# Patient Record
Sex: Female | Born: 1950 | Race: Black or African American | Hispanic: No | Smoking: Former smoker
Health system: Southern US, Community
[De-identification: ages and names within clinical notes are randomized; demographics above are authoritative.]

## PROBLEM LIST (undated history)

## (undated) DIAGNOSIS — I442 Atrioventricular block, complete: Secondary | ICD-10-CM

## (undated) DIAGNOSIS — I4819 Other persistent atrial fibrillation: Secondary | ICD-10-CM

## (undated) DIAGNOSIS — E039 Hypothyroidism, unspecified: Secondary | ICD-10-CM

## (undated) DIAGNOSIS — I272 Pulmonary hypertension, unspecified: Secondary | ICD-10-CM

## (undated) DIAGNOSIS — G459 Transient cerebral ischemic attack, unspecified: Secondary | ICD-10-CM

## (undated) DIAGNOSIS — N183 Chronic kidney disease, stage 3 (moderate): Secondary | ICD-10-CM

## (undated) DIAGNOSIS — E119 Type 2 diabetes mellitus without complications: Secondary | ICD-10-CM

## (undated) DIAGNOSIS — I10 Essential (primary) hypertension: Secondary | ICD-10-CM

## (undated) DIAGNOSIS — Z950 Presence of cardiac pacemaker: Secondary | ICD-10-CM

## (undated) DIAGNOSIS — K219 Gastro-esophageal reflux disease without esophagitis: Secondary | ICD-10-CM

## (undated) HISTORY — DX: Atrioventricular block, complete: I44.2

## (undated) HISTORY — DX: Other persistent atrial fibrillation: I48.19

## (undated) HISTORY — DX: Transient cerebral ischemic attack, unspecified: G45.9

## (undated) HISTORY — PX: TUBAL LIGATION: SHX77

## (undated) HISTORY — DX: Type 2 diabetes mellitus without complications: E11.9

## (undated) HISTORY — DX: Hypothyroidism, unspecified: E03.9

## (undated) HISTORY — PX: ABDOMINAL HYSTERECTOMY: SHX81

## (undated) HISTORY — DX: Gastro-esophageal reflux disease without esophagitis: K21.9

## (undated) HISTORY — DX: Pulmonary hypertension, unspecified: I27.20

## (undated) HISTORY — PX: CHOLECYSTECTOMY: SHX55

## (undated) HISTORY — DX: Essential (primary) hypertension: I10

## (undated) HISTORY — PX: TONSILLECTOMY: SUR1361

## (undated) HISTORY — DX: Presence of cardiac pacemaker: Z95.0

## (undated) HISTORY — DX: Chronic kidney disease, stage 3 (moderate): N18.3

---

## 2005-04-29 ENCOUNTER — Ambulatory Visit: Payer: Self-pay | Admitting: Cardiovascular Disease

## 2005-09-02 ENCOUNTER — Ambulatory Visit: Payer: Self-pay | Admitting: Internal Medicine

## 2007-04-17 ENCOUNTER — Ambulatory Visit: Payer: Self-pay | Admitting: Internal Medicine

## 2007-10-30 ENCOUNTER — Ambulatory Visit: Payer: Self-pay | Admitting: Internal Medicine

## 2008-03-11 ENCOUNTER — Ambulatory Visit: Payer: Self-pay | Admitting: Internal Medicine

## 2008-03-17 ENCOUNTER — Ambulatory Visit: Payer: Self-pay | Admitting: Gastroenterology

## 2008-09-22 ENCOUNTER — Ambulatory Visit: Payer: Self-pay | Admitting: Internal Medicine

## 2008-12-27 ENCOUNTER — Ambulatory Visit: Payer: Self-pay | Admitting: Internal Medicine

## 2009-10-26 ENCOUNTER — Other Ambulatory Visit: Payer: Self-pay | Admitting: Internal Medicine

## 2009-12-21 ENCOUNTER — Other Ambulatory Visit: Payer: Self-pay | Admitting: Internal Medicine

## 2010-04-03 ENCOUNTER — Ambulatory Visit: Payer: Self-pay | Admitting: Internal Medicine

## 2010-06-21 ENCOUNTER — Other Ambulatory Visit: Payer: Self-pay | Admitting: Internal Medicine

## 2012-02-14 ENCOUNTER — Ambulatory Visit: Payer: Self-pay | Admitting: Internal Medicine

## 2012-07-24 ENCOUNTER — Ambulatory Visit: Payer: Self-pay | Admitting: Internal Medicine

## 2012-08-05 ENCOUNTER — Ambulatory Visit: Payer: Self-pay | Admitting: Internal Medicine

## 2013-05-25 ENCOUNTER — Ambulatory Visit (INDEPENDENT_AMBULATORY_CARE_PROVIDER_SITE_OTHER): Payer: PRIVATE HEALTH INSURANCE | Admitting: Podiatry

## 2013-05-25 ENCOUNTER — Encounter: Payer: Self-pay | Admitting: Podiatry

## 2013-05-25 VITALS — BP 148/86 | HR 64 | Resp 16 | Ht 60.0 in | Wt 174.0 lb

## 2013-05-25 DIAGNOSIS — L600 Ingrowing nail: Secondary | ICD-10-CM

## 2013-05-25 NOTE — Progress Notes (Signed)
Subjective:     Patient ID: Renee Boyd, female   DOB: 12-12-1950, 62 y.o.   MRN: [id number]  HPI patient presents concerned that her toenails have not healed secondary to ingrown toenail surgery 1 month ago. Has not noted drainage   Review of Systems  All other systems reviewed and are negative.       Objective:   Physical Exam  Nursing note and vitals reviewed. Cardiovascular: Intact distal pulses.   Musculoskeletal: Normal range of motion.  Neurological: She is alert.  Skin: Skin is warm.   there is crusted tissue formation on the medial border hallux bilateral with no erythema edema or drainage noted    Assessment:     Healing ingrown toenails slowly do to long-term diabetes    Plan:     Explained I do not see signs of infection and I do believe these will heal uneventfully. Given instructions on air drying and thick bandages when wearing shoes

## 2013-07-13 ENCOUNTER — Ambulatory Visit: Payer: Self-pay | Admitting: Internal Medicine

## 2013-07-13 LAB — CBC WITH DIFFERENTIAL/PLATELET
Basophil #: 0 10*3/uL (ref 0.0–0.1)
Basophil %: 0.5 %
Eosinophil #: 0.1 10*3/uL (ref 0.0–0.7)
Eosinophil %: 1.2 %
HCT: 34.4 % — ABNORMAL LOW (ref 35.0–47.0)
HGB: 11 g/dL — ABNORMAL LOW (ref 12.0–16.0)
Lymphocyte #: 1.7 10*3/uL (ref 1.0–3.6)
Lymphocyte %: 34.5 %
MCH: 28.3 pg (ref 26.0–34.0)
MCHC: 31.9 g/dL — ABNORMAL LOW (ref 32.0–36.0)
MCV: 89 fL (ref 80–100)
Monocyte #: 0.4 x10 3/mm (ref 0.2–0.9)
Monocyte %: 8.6 %
Neutrophil #: 2.7 10*3/uL (ref 1.4–6.5)
Neutrophil %: 55.2 %
Platelet: 225 10*3/uL (ref 150–440)
RBC: 3.87 10*6/uL (ref 3.80–5.20)
RDW: 14.6 % — ABNORMAL HIGH (ref 11.5–14.5)
WBC: 4.9 10*3/uL (ref 3.6–11.0)

## 2013-07-13 LAB — BASIC METABOLIC PANEL
Anion Gap: 7 (ref 7–16)
BUN: 26 mg/dL — ABNORMAL HIGH (ref 7–18)
Calcium, Total: 9.8 mg/dL (ref 8.5–10.1)
Chloride: 104 mmol/L (ref 98–107)
Co2: 29 mmol/L (ref 21–32)
Creatinine: 1.1 mg/dL (ref 0.60–1.30)
EGFR (African American): >60
EGFR (Non-African Amer.): 53 — ABNORMAL LOW
Glucose: 91 mg/dL (ref 65–99)
Osmolality: 284 (ref 275–301)
Potassium: 4.1 mmol/L (ref 3.5–5.1)
Sodium: 140 mmol/L (ref 136–145)

## 2013-07-21 ENCOUNTER — Ambulatory Visit: Payer: Self-pay | Admitting: Internal Medicine

## 2014-04-29 DIAGNOSIS — R109 Unspecified abdominal pain: Secondary | ICD-10-CM | POA: Diagnosis not present

## 2014-04-29 DIAGNOSIS — IMO0001 Reserved for inherently not codable concepts without codable children: Secondary | ICD-10-CM | POA: Diagnosis not present

## 2014-04-29 DIAGNOSIS — K859 Acute pancreatitis without necrosis or infection, unspecified: Secondary | ICD-10-CM | POA: Diagnosis not present

## 2014-04-29 DIAGNOSIS — K8020 Calculus of gallbladder without cholecystitis without obstruction: Secondary | ICD-10-CM | POA: Diagnosis not present

## 2014-04-29 DIAGNOSIS — K909 Intestinal malabsorption, unspecified: Secondary | ICD-10-CM | POA: Diagnosis not present

## 2014-05-04 ENCOUNTER — Ambulatory Visit: Payer: Self-pay | Admitting: Internal Medicine

## 2014-05-04 DIAGNOSIS — I77819 Aortic ectasia, unspecified site: Secondary | ICD-10-CM | POA: Diagnosis not present

## 2014-05-04 DIAGNOSIS — K7689 Other specified diseases of liver: Secondary | ICD-10-CM | POA: Diagnosis not present

## 2014-05-04 DIAGNOSIS — R1011 Right upper quadrant pain: Secondary | ICD-10-CM | POA: Diagnosis not present

## 2014-05-11 DIAGNOSIS — K529 Noninfective gastroenteritis and colitis, unspecified: Secondary | ICD-10-CM | POA: Diagnosis not present

## 2014-05-11 DIAGNOSIS — I10 Essential (primary) hypertension: Secondary | ICD-10-CM | POA: Diagnosis not present

## 2014-05-11 DIAGNOSIS — N182 Chronic kidney disease, stage 2 (mild): Secondary | ICD-10-CM | POA: Diagnosis not present

## 2014-05-11 DIAGNOSIS — Z950 Presence of cardiac pacemaker: Secondary | ICD-10-CM | POA: Diagnosis not present

## 2014-05-18 DIAGNOSIS — K589 Irritable bowel syndrome without diarrhea: Secondary | ICD-10-CM | POA: Diagnosis not present

## 2014-05-18 DIAGNOSIS — K760 Fatty (change of) liver, not elsewhere classified: Secondary | ICD-10-CM | POA: Diagnosis not present

## 2014-05-18 DIAGNOSIS — R112 Nausea with vomiting, unspecified: Secondary | ICD-10-CM | POA: Diagnosis not present

## 2014-05-18 DIAGNOSIS — D126 Benign neoplasm of colon, unspecified: Secondary | ICD-10-CM | POA: Diagnosis not present

## 2014-06-09 DIAGNOSIS — Z45010 Encounter for checking and testing of cardiac pacemaker pulse generator [battery]: Secondary | ICD-10-CM | POA: Diagnosis not present

## 2014-06-09 DIAGNOSIS — Z950 Presence of cardiac pacemaker: Secondary | ICD-10-CM | POA: Diagnosis not present

## 2014-06-09 DIAGNOSIS — N182 Chronic kidney disease, stage 2 (mild): Secondary | ICD-10-CM | POA: Diagnosis not present

## 2014-06-09 DIAGNOSIS — I2510 Atherosclerotic heart disease of native coronary artery without angina pectoris: Secondary | ICD-10-CM | POA: Diagnosis not present

## 2014-07-05 ENCOUNTER — Ambulatory Visit: Payer: Self-pay | Admitting: Gastroenterology

## 2014-07-05 DIAGNOSIS — E039 Hypothyroidism, unspecified: Secondary | ICD-10-CM | POA: Diagnosis not present

## 2014-07-05 DIAGNOSIS — D130 Benign neoplasm of esophagus: Secondary | ICD-10-CM | POA: Diagnosis not present

## 2014-07-05 DIAGNOSIS — Z86010 Personal history of colonic polyps: Secondary | ICD-10-CM | POA: Diagnosis not present

## 2014-07-05 DIAGNOSIS — R112 Nausea with vomiting, unspecified: Secondary | ICD-10-CM | POA: Diagnosis not present

## 2014-07-05 DIAGNOSIS — I2510 Atherosclerotic heart disease of native coronary artery without angina pectoris: Secondary | ICD-10-CM | POA: Diagnosis not present

## 2014-07-05 DIAGNOSIS — Z1211 Encounter for screening for malignant neoplasm of colon: Secondary | ICD-10-CM | POA: Diagnosis not present

## 2014-07-05 DIAGNOSIS — Z87891 Personal history of nicotine dependence: Secondary | ICD-10-CM | POA: Diagnosis not present

## 2014-07-05 DIAGNOSIS — E119 Type 2 diabetes mellitus without complications: Secondary | ICD-10-CM | POA: Diagnosis not present

## 2014-07-05 DIAGNOSIS — K449 Diaphragmatic hernia without obstruction or gangrene: Secondary | ICD-10-CM | POA: Diagnosis not present

## 2014-07-05 DIAGNOSIS — Z79899 Other long term (current) drug therapy: Secondary | ICD-10-CM | POA: Diagnosis not present

## 2014-07-05 DIAGNOSIS — I10 Essential (primary) hypertension: Secondary | ICD-10-CM | POA: Diagnosis not present

## 2014-07-05 DIAGNOSIS — K317 Polyp of stomach and duodenum: Secondary | ICD-10-CM | POA: Diagnosis not present

## 2014-07-05 DIAGNOSIS — Z9889 Other specified postprocedural states: Secondary | ICD-10-CM | POA: Diagnosis not present

## 2014-07-05 DIAGNOSIS — D124 Benign neoplasm of descending colon: Secondary | ICD-10-CM | POA: Diagnosis not present

## 2014-07-05 DIAGNOSIS — D125 Benign neoplasm of sigmoid colon: Secondary | ICD-10-CM | POA: Diagnosis not present

## 2014-08-16 DIAGNOSIS — E119 Type 2 diabetes mellitus without complications: Secondary | ICD-10-CM | POA: Diagnosis not present

## 2014-08-16 DIAGNOSIS — H40053 Ocular hypertension, bilateral: Secondary | ICD-10-CM | POA: Diagnosis not present

## 2014-09-09 DIAGNOSIS — Z950 Presence of cardiac pacemaker: Secondary | ICD-10-CM | POA: Diagnosis not present

## 2014-09-09 DIAGNOSIS — I10 Essential (primary) hypertension: Secondary | ICD-10-CM | POA: Diagnosis not present

## 2014-09-20 DIAGNOSIS — E8881 Metabolic syndrome: Secondary | ICD-10-CM | POA: Diagnosis not present

## 2014-09-20 DIAGNOSIS — N182 Chronic kidney disease, stage 2 (mild): Secondary | ICD-10-CM | POA: Diagnosis not present

## 2014-09-20 DIAGNOSIS — I2510 Atherosclerotic heart disease of native coronary artery without angina pectoris: Secondary | ICD-10-CM | POA: Diagnosis not present

## 2014-09-20 DIAGNOSIS — E1165 Type 2 diabetes mellitus with hyperglycemia: Secondary | ICD-10-CM | POA: Diagnosis not present

## 2014-10-07 DIAGNOSIS — M7072 Other bursitis of hip, left hip: Secondary | ICD-10-CM | POA: Diagnosis not present

## 2014-10-12 DIAGNOSIS — H40003 Preglaucoma, unspecified, bilateral: Secondary | ICD-10-CM | POA: Diagnosis not present

## 2014-11-25 NOTE — Op Note (Signed)
PATIENT NAME:  Renee Boyd, Renee Boyd MR#:  [id number] DATE OF BIRTH:  1951/08/03  DATE OF PROCEDURE:  07/21/2013  PROCEDURE: Replacement of permanent pacemaker (battery change).   PREOPERATIVE DIAGNOSES:  Syncope, severe junctional bradycardia, atrial fibrillation.   POSTOPERATIVE DIAGNOSES:  Syncope, severe junctional bradycardia, atrial fibrillation.   ATTENDING PHYSICIAN:  Cletis Athens, MD  IMPLANTING PHYSICIAN: Cletis Athens, MD  PACER DATA: Device name Advisa, serial number [id number].  This is MRI compatible pacemaker, MRI compatibility [id number].  Leads are also MRI compatible. Mode DDDR, low rate 60, upper tracking rate 130, operative sensor 130. Paced AV 180 ms, threshold measured at the time of interrogation of the lead after explantation of the pacemaker on the right ventricular lead resistance was 647, threshold 0.4, current 0.6, atrial, lead, resistance 458.   PROCEDURE NOTE: The patient was taken to the Operating Room and the left upper chest was prepared with Hibiclens and it was draped in a sterile mild fashion. Next, 1% Xylocaine was infiltrated under the skin overlying the previous pacemaker site and subcutaneous tissue were infiltrated with lidocaine 1% and then an incision was made on the skin overlying the deltopectoral groove. After making an incision, subcutaneous tissues were dissected out. Pacemaker pocket was found and opened, and it was washed with gentamicin solution. This pacemaker was taken out and both leads were disconnected and they were checked and they were found to be satisfactory. Appearance of the lead also looks good without any corrosion. Next, after checking both leads the ventricular lead was put in the ventricular side of the pacemaker and the atrial lead to the atrial channel of the pacemaker. All connections were tightened securely according to company specification. Pacemaker was put in the pocket. Subcutaneous tissue and skin was closed separately, and the patient  was returned to the recovery room in a stable condition.    ____________________________ Cletis Athens, MD jm:cs D: 07/21/2013 14:09:33 ET T: 07/21/2013 20:11:17 ET JOB#: [id number]  cc: Cletis Athens, MD, <Dictator> Cletis Athens MD ELECTRONICALLY SIGNED 08/04/2013 13:39

## 2014-11-28 LAB — SURGICAL PATHOLOGY

## 2014-12-08 DIAGNOSIS — I2510 Atherosclerotic heart disease of native coronary artery without angina pectoris: Secondary | ICD-10-CM | POA: Diagnosis not present

## 2014-12-08 DIAGNOSIS — Z950 Presence of cardiac pacemaker: Secondary | ICD-10-CM | POA: Diagnosis not present

## 2014-12-08 DIAGNOSIS — K2960 Other gastritis without bleeding: Secondary | ICD-10-CM | POA: Diagnosis not present

## 2014-12-19 DIAGNOSIS — I209 Angina pectoris, unspecified: Secondary | ICD-10-CM | POA: Diagnosis not present

## 2014-12-19 DIAGNOSIS — I2510 Atherosclerotic heart disease of native coronary artery without angina pectoris: Secondary | ICD-10-CM | POA: Diagnosis not present

## 2014-12-19 DIAGNOSIS — K2960 Other gastritis without bleeding: Secondary | ICD-10-CM | POA: Diagnosis not present

## 2014-12-19 DIAGNOSIS — E1165 Type 2 diabetes mellitus with hyperglycemia: Secondary | ICD-10-CM | POA: Diagnosis not present

## 2014-12-21 DIAGNOSIS — E119 Type 2 diabetes mellitus without complications: Secondary | ICD-10-CM | POA: Diagnosis not present

## 2015-01-03 DIAGNOSIS — I2510 Atherosclerotic heart disease of native coronary artery without angina pectoris: Secondary | ICD-10-CM | POA: Diagnosis not present

## 2015-01-03 DIAGNOSIS — R609 Edema, unspecified: Secondary | ICD-10-CM | POA: Diagnosis not present

## 2015-01-03 DIAGNOSIS — I209 Angina pectoris, unspecified: Secondary | ICD-10-CM | POA: Diagnosis not present

## 2015-01-03 DIAGNOSIS — E1165 Type 2 diabetes mellitus with hyperglycemia: Secondary | ICD-10-CM | POA: Diagnosis not present

## 2015-02-02 DIAGNOSIS — E669 Obesity, unspecified: Secondary | ICD-10-CM | POA: Diagnosis not present

## 2015-02-02 DIAGNOSIS — I209 Angina pectoris, unspecified: Secondary | ICD-10-CM | POA: Diagnosis not present

## 2015-02-02 DIAGNOSIS — E1165 Type 2 diabetes mellitus with hyperglycemia: Secondary | ICD-10-CM | POA: Diagnosis not present

## 2015-02-02 DIAGNOSIS — I2510 Atherosclerotic heart disease of native coronary artery without angina pectoris: Secondary | ICD-10-CM | POA: Diagnosis not present

## 2015-03-09 DIAGNOSIS — I209 Angina pectoris, unspecified: Secondary | ICD-10-CM | POA: Diagnosis not present

## 2015-03-09 DIAGNOSIS — I2510 Atherosclerotic heart disease of native coronary artery without angina pectoris: Secondary | ICD-10-CM | POA: Diagnosis not present

## 2015-03-09 DIAGNOSIS — Z950 Presence of cardiac pacemaker: Secondary | ICD-10-CM | POA: Diagnosis not present

## 2015-03-22 DIAGNOSIS — I2510 Atherosclerotic heart disease of native coronary artery without angina pectoris: Secondary | ICD-10-CM | POA: Diagnosis not present

## 2015-03-22 DIAGNOSIS — R609 Edema, unspecified: Secondary | ICD-10-CM | POA: Diagnosis not present

## 2015-03-22 DIAGNOSIS — I209 Angina pectoris, unspecified: Secondary | ICD-10-CM | POA: Diagnosis not present

## 2015-03-22 DIAGNOSIS — Z881 Allergy status to other antibiotic agents status: Secondary | ICD-10-CM | POA: Diagnosis not present

## 2015-03-29 DIAGNOSIS — E1165 Type 2 diabetes mellitus with hyperglycemia: Secondary | ICD-10-CM | POA: Diagnosis not present

## 2015-03-29 DIAGNOSIS — Z950 Presence of cardiac pacemaker: Secondary | ICD-10-CM | POA: Diagnosis not present

## 2015-03-29 DIAGNOSIS — K2960 Other gastritis without bleeding: Secondary | ICD-10-CM | POA: Diagnosis not present

## 2015-03-29 DIAGNOSIS — I2510 Atherosclerotic heart disease of native coronary artery without angina pectoris: Secondary | ICD-10-CM | POA: Diagnosis not present

## 2015-05-05 DIAGNOSIS — T63301A Toxic effect of unspecified spider venom, accidental (unintentional), initial encounter: Secondary | ICD-10-CM | POA: Diagnosis not present

## 2015-05-05 DIAGNOSIS — E1165 Type 2 diabetes mellitus with hyperglycemia: Secondary | ICD-10-CM | POA: Diagnosis not present

## 2015-05-09 DIAGNOSIS — E1165 Type 2 diabetes mellitus with hyperglycemia: Secondary | ICD-10-CM | POA: Diagnosis not present

## 2015-05-09 DIAGNOSIS — Z23 Encounter for immunization: Secondary | ICD-10-CM | POA: Diagnosis not present

## 2015-05-09 DIAGNOSIS — I2510 Atherosclerotic heart disease of native coronary artery without angina pectoris: Secondary | ICD-10-CM | POA: Diagnosis not present

## 2015-05-09 DIAGNOSIS — T63301A Toxic effect of unspecified spider venom, accidental (unintentional), initial encounter: Secondary | ICD-10-CM | POA: Diagnosis not present

## 2015-06-08 DIAGNOSIS — E1165 Type 2 diabetes mellitus with hyperglycemia: Secondary | ICD-10-CM | POA: Diagnosis not present

## 2015-06-08 DIAGNOSIS — Z881 Allergy status to other antibiotic agents status: Secondary | ICD-10-CM | POA: Diagnosis not present

## 2015-06-08 DIAGNOSIS — Z950 Presence of cardiac pacemaker: Secondary | ICD-10-CM | POA: Diagnosis not present

## 2015-06-08 DIAGNOSIS — N182 Chronic kidney disease, stage 2 (mild): Secondary | ICD-10-CM | POA: Diagnosis not present

## 2015-06-08 DIAGNOSIS — I209 Angina pectoris, unspecified: Secondary | ICD-10-CM | POA: Diagnosis not present

## 2015-06-16 DIAGNOSIS — I10 Essential (primary) hypertension: Secondary | ICD-10-CM | POA: Diagnosis not present

## 2015-06-16 DIAGNOSIS — R5381 Other malaise: Secondary | ICD-10-CM | POA: Diagnosis not present

## 2015-06-16 DIAGNOSIS — E784 Other hyperlipidemia: Secondary | ICD-10-CM | POA: Diagnosis not present

## 2015-06-16 DIAGNOSIS — E119 Type 2 diabetes mellitus without complications: Secondary | ICD-10-CM | POA: Diagnosis not present

## 2015-06-28 DIAGNOSIS — H40003 Preglaucoma, unspecified, bilateral: Secondary | ICD-10-CM | POA: Diagnosis not present

## 2015-06-28 DIAGNOSIS — H2513 Age-related nuclear cataract, bilateral: Secondary | ICD-10-CM | POA: Diagnosis not present

## 2015-06-28 LAB — HM DIABETES EYE EXAM

## 2015-08-10 DIAGNOSIS — M7062 Trochanteric bursitis, left hip: Secondary | ICD-10-CM | POA: Diagnosis not present

## 2015-08-10 DIAGNOSIS — M545 Low back pain: Secondary | ICD-10-CM | POA: Diagnosis not present

## 2015-09-04 DIAGNOSIS — I209 Angina pectoris, unspecified: Secondary | ICD-10-CM | POA: Diagnosis not present

## 2015-09-04 DIAGNOSIS — K2960 Other gastritis without bleeding: Secondary | ICD-10-CM | POA: Diagnosis not present

## 2015-09-04 DIAGNOSIS — N182 Chronic kidney disease, stage 2 (mild): Secondary | ICD-10-CM | POA: Diagnosis not present

## 2015-09-04 DIAGNOSIS — E1165 Type 2 diabetes mellitus with hyperglycemia: Secondary | ICD-10-CM | POA: Diagnosis not present

## 2015-09-08 DIAGNOSIS — N182 Chronic kidney disease, stage 2 (mild): Secondary | ICD-10-CM | POA: Diagnosis not present

## 2015-09-08 DIAGNOSIS — Z950 Presence of cardiac pacemaker: Secondary | ICD-10-CM | POA: Diagnosis not present

## 2015-09-08 DIAGNOSIS — E1165 Type 2 diabetes mellitus with hyperglycemia: Secondary | ICD-10-CM | POA: Diagnosis not present

## 2015-09-08 DIAGNOSIS — I209 Angina pectoris, unspecified: Secondary | ICD-10-CM | POA: Diagnosis not present

## 2015-11-01 DIAGNOSIS — E669 Obesity, unspecified: Secondary | ICD-10-CM | POA: Diagnosis not present

## 2015-11-01 DIAGNOSIS — N182 Chronic kidney disease, stage 2 (mild): Secondary | ICD-10-CM | POA: Diagnosis not present

## 2015-11-01 DIAGNOSIS — Z881 Allergy status to other antibiotic agents status: Secondary | ICD-10-CM | POA: Diagnosis not present

## 2015-11-01 DIAGNOSIS — E1165 Type 2 diabetes mellitus with hyperglycemia: Secondary | ICD-10-CM | POA: Diagnosis not present

## 2015-11-02 DIAGNOSIS — E784 Other hyperlipidemia: Secondary | ICD-10-CM | POA: Diagnosis not present

## 2015-11-02 DIAGNOSIS — I10 Essential (primary) hypertension: Secondary | ICD-10-CM | POA: Diagnosis not present

## 2015-11-02 DIAGNOSIS — E119 Type 2 diabetes mellitus without complications: Secondary | ICD-10-CM | POA: Diagnosis not present

## 2015-12-07 DIAGNOSIS — N182 Chronic kidney disease, stage 2 (mild): Secondary | ICD-10-CM | POA: Diagnosis not present

## 2015-12-07 DIAGNOSIS — I2510 Atherosclerotic heart disease of native coronary artery without angina pectoris: Secondary | ICD-10-CM | POA: Diagnosis not present

## 2015-12-07 DIAGNOSIS — E1165 Type 2 diabetes mellitus with hyperglycemia: Secondary | ICD-10-CM | POA: Diagnosis not present

## 2015-12-07 DIAGNOSIS — Z950 Presence of cardiac pacemaker: Secondary | ICD-10-CM | POA: Diagnosis not present

## 2015-12-07 DIAGNOSIS — I209 Angina pectoris, unspecified: Secondary | ICD-10-CM | POA: Diagnosis not present

## 2015-12-19 ENCOUNTER — Ambulatory Visit
Admission: RE | Admit: 2015-12-19 | Discharge: 2015-12-19 | Disposition: A | Discharge status: Home / Self Care | Payer: Medicare Other | Source: Ambulatory Visit | Attending: Internal Medicine | Admitting: Internal Medicine

## 2015-12-19 ENCOUNTER — Other Ambulatory Visit: Payer: Self-pay | Admitting: Internal Medicine

## 2015-12-19 DIAGNOSIS — R29898 Other symptoms and signs involving the musculoskeletal system: Secondary | ICD-10-CM | POA: Diagnosis not present

## 2015-12-19 DIAGNOSIS — N182 Chronic kidney disease, stage 2 (mild): Secondary | ICD-10-CM | POA: Diagnosis not present

## 2015-12-19 DIAGNOSIS — G319 Degenerative disease of nervous system, unspecified: Secondary | ICD-10-CM | POA: Diagnosis not present

## 2015-12-19 DIAGNOSIS — R531 Weakness: Secondary | ICD-10-CM | POA: Diagnosis not present

## 2015-12-19 DIAGNOSIS — G459 Transient cerebral ischemic attack, unspecified: Secondary | ICD-10-CM | POA: Diagnosis not present

## 2015-12-19 DIAGNOSIS — I2510 Atherosclerotic heart disease of native coronary artery without angina pectoris: Secondary | ICD-10-CM | POA: Diagnosis not present

## 2015-12-20 DIAGNOSIS — G459 Transient cerebral ischemic attack, unspecified: Secondary | ICD-10-CM | POA: Diagnosis not present

## 2015-12-25 DIAGNOSIS — I69392 Facial weakness following cerebral infarction: Secondary | ICD-10-CM | POA: Diagnosis not present

## 2015-12-25 DIAGNOSIS — I4892 Unspecified atrial flutter: Secondary | ICD-10-CM | POA: Diagnosis not present

## 2015-12-25 DIAGNOSIS — J399 Disease of upper respiratory tract, unspecified: Secondary | ICD-10-CM | POA: Diagnosis not present

## 2015-12-25 DIAGNOSIS — I4891 Unspecified atrial fibrillation: Secondary | ICD-10-CM | POA: Diagnosis not present

## 2016-01-02 DIAGNOSIS — H2513 Age-related nuclear cataract, bilateral: Secondary | ICD-10-CM | POA: Diagnosis not present

## 2016-01-09 DIAGNOSIS — I129 Hypertensive chronic kidney disease with stage 1 through stage 4 chronic kidney disease, or unspecified chronic kidney disease: Secondary | ICD-10-CM | POA: Diagnosis not present

## 2016-01-09 DIAGNOSIS — N184 Chronic kidney disease, stage 4 (severe): Secondary | ICD-10-CM | POA: Diagnosis not present

## 2016-01-09 DIAGNOSIS — E1122 Type 2 diabetes mellitus with diabetic chronic kidney disease: Secondary | ICD-10-CM | POA: Diagnosis not present

## 2016-01-09 DIAGNOSIS — E875 Hyperkalemia: Secondary | ICD-10-CM | POA: Diagnosis not present

## 2016-01-09 DIAGNOSIS — E785 Hyperlipidemia, unspecified: Secondary | ICD-10-CM | POA: Diagnosis not present

## 2016-01-11 DIAGNOSIS — I4891 Unspecified atrial fibrillation: Secondary | ICD-10-CM | POA: Diagnosis not present

## 2016-01-11 DIAGNOSIS — I4892 Unspecified atrial flutter: Secondary | ICD-10-CM | POA: Diagnosis not present

## 2016-01-16 DIAGNOSIS — N184 Chronic kidney disease, stage 4 (severe): Secondary | ICD-10-CM | POA: Diagnosis not present

## 2016-01-31 DIAGNOSIS — I4891 Unspecified atrial fibrillation: Secondary | ICD-10-CM | POA: Diagnosis not present

## 2016-01-31 DIAGNOSIS — I4892 Unspecified atrial flutter: Secondary | ICD-10-CM | POA: Diagnosis not present

## 2016-01-31 DIAGNOSIS — M7989 Other specified soft tissue disorders: Secondary | ICD-10-CM | POA: Diagnosis not present

## 2016-01-31 DIAGNOSIS — Z950 Presence of cardiac pacemaker: Secondary | ICD-10-CM | POA: Diagnosis not present

## 2016-02-07 ENCOUNTER — Emergency Department: Payer: Medicare Other

## 2016-02-07 ENCOUNTER — Encounter: Payer: Self-pay | Admitting: Emergency Medicine

## 2016-02-07 ENCOUNTER — Inpatient Hospital Stay
Admission: EM | Admit: 2016-02-07 | Discharge: 2016-02-11 | DRG: 813 | Disposition: A | Discharge status: Home / Self Care | Payer: Medicare Other | Attending: Internal Medicine | Admitting: Internal Medicine

## 2016-02-07 DIAGNOSIS — J811 Chronic pulmonary edema: Secondary | ICD-10-CM | POA: Diagnosis present

## 2016-02-07 DIAGNOSIS — D6832 Hemorrhagic disorder due to extrinsic circulating anticoagulants: Principal | ICD-10-CM | POA: Diagnosis present

## 2016-02-07 DIAGNOSIS — Z7901 Long term (current) use of anticoagulants: Secondary | ICD-10-CM

## 2016-02-07 DIAGNOSIS — R58 Hemorrhage, not elsewhere classified: Secondary | ICD-10-CM | POA: Diagnosis not present

## 2016-02-07 DIAGNOSIS — Z8673 Personal history of transient ischemic attack (TIA), and cerebral infarction without residual deficits: Secondary | ICD-10-CM

## 2016-02-07 DIAGNOSIS — I129 Hypertensive chronic kidney disease with stage 1 through stage 4 chronic kidney disease, or unspecified chronic kidney disease: Secondary | ICD-10-CM | POA: Diagnosis present

## 2016-02-07 DIAGNOSIS — Z8249 Family history of ischemic heart disease and other diseases of the circulatory system: Secondary | ICD-10-CM

## 2016-02-07 DIAGNOSIS — N179 Acute kidney failure, unspecified: Secondary | ICD-10-CM

## 2016-02-07 DIAGNOSIS — R791 Abnormal coagulation profile: Secondary | ICD-10-CM | POA: Diagnosis not present

## 2016-02-07 DIAGNOSIS — M7981 Nontraumatic hematoma of soft tissue: Secondary | ICD-10-CM | POA: Diagnosis present

## 2016-02-07 DIAGNOSIS — M79603 Pain in arm, unspecified: Secondary | ICD-10-CM | POA: Diagnosis not present

## 2016-02-07 DIAGNOSIS — E1122 Type 2 diabetes mellitus with diabetic chronic kidney disease: Secondary | ICD-10-CM | POA: Diagnosis present

## 2016-02-07 DIAGNOSIS — Z79899 Other long term (current) drug therapy: Secondary | ICD-10-CM

## 2016-02-07 DIAGNOSIS — I10 Essential (primary) hypertension: Secondary | ICD-10-CM | POA: Diagnosis not present

## 2016-02-07 DIAGNOSIS — M79605 Pain in left leg: Secondary | ICD-10-CM | POA: Diagnosis not present

## 2016-02-07 DIAGNOSIS — D649 Anemia, unspecified: Secondary | ICD-10-CM

## 2016-02-07 DIAGNOSIS — K219 Gastro-esophageal reflux disease without esophagitis: Secondary | ICD-10-CM | POA: Diagnosis present

## 2016-02-07 DIAGNOSIS — E039 Hypothyroidism, unspecified: Secondary | ICD-10-CM | POA: Diagnosis present

## 2016-02-07 DIAGNOSIS — Z950 Presence of cardiac pacemaker: Secondary | ICD-10-CM

## 2016-02-07 DIAGNOSIS — Z87891 Personal history of nicotine dependence: Secondary | ICD-10-CM

## 2016-02-07 DIAGNOSIS — R262 Difficulty in walking, not elsewhere classified: Secondary | ICD-10-CM | POA: Diagnosis not present

## 2016-02-07 DIAGNOSIS — Z90710 Acquired absence of both cervix and uterus: Secondary | ICD-10-CM

## 2016-02-07 DIAGNOSIS — J9601 Acute respiratory failure with hypoxia: Secondary | ICD-10-CM | POA: Diagnosis present

## 2016-02-07 DIAGNOSIS — E119 Type 2 diabetes mellitus without complications: Secondary | ICD-10-CM | POA: Diagnosis not present

## 2016-02-07 DIAGNOSIS — M79601 Pain in right arm: Secondary | ICD-10-CM | POA: Diagnosis not present

## 2016-02-07 DIAGNOSIS — Z885 Allergy status to narcotic agent status: Secondary | ICD-10-CM

## 2016-02-07 DIAGNOSIS — M79621 Pain in right upper arm: Secondary | ICD-10-CM | POA: Diagnosis not present

## 2016-02-07 DIAGNOSIS — T45515A Adverse effect of anticoagulants, initial encounter: Secondary | ICD-10-CM | POA: Diagnosis present

## 2016-02-07 DIAGNOSIS — M79604 Pain in right leg: Secondary | ICD-10-CM | POA: Diagnosis not present

## 2016-02-07 DIAGNOSIS — R531 Weakness: Secondary | ICD-10-CM | POA: Diagnosis not present

## 2016-02-07 DIAGNOSIS — R0902 Hypoxemia: Secondary | ICD-10-CM

## 2016-02-07 DIAGNOSIS — N183 Chronic kidney disease, stage 3 (moderate): Secondary | ICD-10-CM | POA: Diagnosis present

## 2016-02-07 LAB — RETICULOCYTES
RBC.: 2.54 MIL/uL — AB (ref 3.80–5.20)
RETIC CT PCT: 3 % (ref 0.4–3.1)
Retic Count, Absolute: 76.2 10*3/uL (ref 19.0–183.0)

## 2016-02-07 LAB — PHOSPHORUS: PHOSPHORUS: 4.5 mg/dL (ref 2.5–4.6)

## 2016-02-07 LAB — PREPARE RBC (CROSSMATCH)

## 2016-02-07 LAB — URINALYSIS COMPLETE WITH MICROSCOPIC (ARMC ONLY)
BILIRUBIN URINE: NEGATIVE
GLUCOSE, UA: NEGATIVE mg/dL
HGB URINE DIPSTICK: NEGATIVE
KETONES UR: NEGATIVE mg/dL
NITRITE: NEGATIVE
PH: 5 (ref 5.0–8.0)
Protein, ur: NEGATIVE mg/dL
Specific Gravity, Urine: 1.024 (ref 1.005–1.030)

## 2016-02-07 LAB — IRON AND TIBC
Iron: 41 ug/dL (ref 28–170)
SATURATION RATIOS: 13 % (ref 10.4–31.8)
TIBC: 323 ug/dL (ref 250–450)
UIBC: 282 ug/dL

## 2016-02-07 LAB — CBC WITH DIFFERENTIAL/PLATELET
BASOS PCT: 1 %
Basophils Absolute: 0.1 10*3/uL (ref 0–0.1)
EOS ABS: 0 10*3/uL (ref 0–0.7)
EOS PCT: 1 %
HCT: 20.5 % — ABNORMAL LOW (ref 35.0–47.0)
HEMOGLOBIN: 6.8 g/dL — AB (ref 12.0–16.0)
Lymphocytes Relative: 14 %
Lymphs Abs: 1.5 10*3/uL (ref 1.0–3.6)
MCH: 29 pg (ref 26.0–34.0)
MCHC: 33.2 g/dL (ref 32.0–36.0)
MCV: 87.2 fL (ref 80.0–100.0)
Monocytes Absolute: 0.7 10*3/uL (ref 0.2–0.9)
Monocytes Relative: 7 %
NEUTROS PCT: 77 %
Neutro Abs: 8.1 10*3/uL — ABNORMAL HIGH (ref 1.4–6.5)
PLATELETS: 439 10*3/uL (ref 150–440)
RBC: 2.36 MIL/uL — AB (ref 3.80–5.20)
RDW: 15.6 % — ABNORMAL HIGH (ref 11.5–14.5)
WBC: 10.4 10*3/uL (ref 3.6–11.0)

## 2016-02-07 LAB — GLUCOSE, CAPILLARY
Glucose-Capillary: 123 mg/dL — ABNORMAL HIGH (ref 65–99)
Glucose-Capillary: 133 mg/dL — ABNORMAL HIGH (ref 65–99)

## 2016-02-07 LAB — COMPREHENSIVE METABOLIC PANEL
ALK PHOS: 60 U/L (ref 38–126)
ALT: 10 U/L — AB (ref 14–54)
AST: 20 U/L (ref 15–41)
Albumin: 3.3 g/dL — ABNORMAL LOW (ref 3.5–5.0)
Anion gap: 11 (ref 5–15)
BILIRUBIN TOTAL: 0.7 mg/dL (ref 0.3–1.2)
BUN: 60 mg/dL — AB (ref 6–20)
CALCIUM: 9.2 mg/dL (ref 8.9–10.3)
CHLORIDE: 106 mmol/L (ref 101–111)
CO2: 19 mmol/L — ABNORMAL LOW (ref 22–32)
CREATININE: 2.63 mg/dL — AB (ref 0.44–1.00)
GFR, EST AFRICAN AMERICAN: 21 mL/min — AB (ref >60)
GFR, EST NON AFRICAN AMERICAN: 18 mL/min — AB (ref >60)
Glucose, Bld: 129 mg/dL — ABNORMAL HIGH (ref 65–99)
Potassium: 4.6 mmol/L (ref 3.5–5.1)
Sodium: 136 mmol/L (ref 135–145)
TOTAL PROTEIN: 7.1 g/dL (ref 6.5–8.1)

## 2016-02-07 LAB — VITAMIN B12: Vitamin B-12: 589 pg/mL (ref 180–914)

## 2016-02-07 LAB — PROTIME-INR: INR: >10

## 2016-02-07 LAB — ABO/RH: ABO/RH(D): A POS

## 2016-02-07 LAB — FERRITIN: FERRITIN: 191 ng/mL (ref 11–307)

## 2016-02-07 LAB — TSH: TSH: 0.793 u[IU]/mL (ref 0.350–4.500)

## 2016-02-07 LAB — MAGNESIUM: Magnesium: 1.8 mg/dL (ref 1.7–2.4)

## 2016-02-07 LAB — FOLATE: FOLATE: 10.5 ng/mL (ref >5.9)

## 2016-02-07 LAB — TROPONIN I

## 2016-02-07 MED ORDER — ONDANSETRON HCL 4 MG/2ML IJ SOLN: 4.0000 mg | Freq: Four times a day (QID) | INTRAMUSCULAR | Status: DC | PRN

## 2016-02-07 MED ORDER — METOPROLOL SUCCINATE ER 50 MG PO TB24
100.0000 mg | ORAL_TABLET | Freq: Every day | ORAL | Status: DC
  Administered 2016-02-07 – 2016-02-11 (×4): 100 mg via ORAL
  Filled 2016-02-07 (×5): qty 2

## 2016-02-07 MED ORDER — ACETAMINOPHEN 325 MG PO TABS
650.0000 mg | ORAL_TABLET | Freq: Four times a day (QID) | ORAL | Status: DC | PRN
  Administered 2016-02-07: 650 mg via ORAL
  Filled 2016-02-07: qty 2

## 2016-02-07 MED ORDER — CLONIDINE HCL 0.1 MG PO TABS
0.2000 mg | ORAL_TABLET | Freq: Every day | ORAL | Status: DC
  Administered 2016-02-08 – 2016-02-10 (×3): 0.2 mg via ORAL
  Filled 2016-02-07 (×3): qty 2

## 2016-02-07 MED ORDER — INSULIN ASPART 100 UNIT/ML ~~LOC~~ SOLN: 0.0000 [IU] | Freq: Every day | SUBCUTANEOUS | Status: DC

## 2016-02-07 MED ORDER — SUCRALFATE 1 G PO TABS
1.0000 g | ORAL_TABLET | Freq: Two times a day (BID) | ORAL | Status: DC
  Administered 2016-02-07 – 2016-02-11 (×8): 1 g via ORAL
  Filled 2016-02-07 (×8): qty 1

## 2016-02-07 MED ORDER — WARFARIN - PHARMACIST DOSING INPATIENT: Freq: Every day | Status: DC

## 2016-02-07 MED ORDER — SODIUM CHLORIDE 0.9 % IV SOLN: 10.0000 mL/h | Freq: Once | INTRAVENOUS | Status: DC

## 2016-02-07 MED ORDER — SODIUM CHLORIDE 0.9 % IV BOLUS (SEPSIS)
1000.0000 mL | Freq: Once | INTRAVENOUS | Status: AC
  Administered 2016-02-07: 1000 mL via INTRAVENOUS

## 2016-02-07 MED ORDER — TRAMADOL HCL 50 MG PO TABS
50.0000 mg | ORAL_TABLET | Freq: Four times a day (QID) | ORAL | Status: DC | PRN
  Administered 2016-02-08 – 2016-02-11 (×4): 50 mg via ORAL
  Filled 2016-02-07 (×4): qty 1

## 2016-02-07 MED ORDER — INSULIN ASPART 100 UNIT/ML ~~LOC~~ SOLN
0.0000 [IU] | Freq: Three times a day (TID) | SUBCUTANEOUS | Status: DC
  Administered 2016-02-07: 1 [IU] via SUBCUTANEOUS
  Administered 2016-02-08 (×3): 2 [IU] via SUBCUTANEOUS
  Administered 2016-02-09: 3 [IU] via SUBCUTANEOUS
  Administered 2016-02-09: 2 [IU] via SUBCUTANEOUS
  Administered 2016-02-09 – 2016-02-10 (×2): 1 [IU] via SUBCUTANEOUS
  Administered 2016-02-10: 2 [IU] via SUBCUTANEOUS
  Administered 2016-02-10: 5 [IU] via SUBCUTANEOUS
  Administered 2016-02-11: 1 [IU] via SUBCUTANEOUS
  Administered 2016-02-11: 2 [IU] via SUBCUTANEOUS
  Filled 2016-02-07 (×3): qty 2
  Filled 2016-02-07: qty 3
  Filled 2016-02-07: qty 1
  Filled 2016-02-07: qty 2
  Filled 2016-02-07: qty 1
  Filled 2016-02-07: qty 5
  Filled 2016-02-07: qty 2
  Filled 2016-02-07: qty 1
  Filled 2016-02-07: qty 2
  Filled 2016-02-07: qty 1

## 2016-02-07 MED ORDER — AMIODARONE HCL 200 MG PO TABS
100.0000 mg | ORAL_TABLET | Freq: Every day | ORAL | Status: DC
  Administered 2016-02-07 – 2016-02-11 (×4): 100 mg via ORAL
  Filled 2016-02-07 (×5): qty 1

## 2016-02-07 MED ORDER — GABAPENTIN 300 MG PO CAPS
300.0000 mg | ORAL_CAPSULE | Freq: Two times a day (BID) | ORAL | Status: DC
  Administered 2016-02-07 – 2016-02-11 (×8): 300 mg via ORAL
  Filled 2016-02-07 (×8): qty 1

## 2016-02-07 MED ORDER — PANTOPRAZOLE SODIUM 40 MG PO TBEC
40.0000 mg | DELAYED_RELEASE_TABLET | Freq: Every day | ORAL | Status: DC
  Administered 2016-02-07 – 2016-02-11 (×5): 40 mg via ORAL
  Filled 2016-02-07 (×5): qty 1

## 2016-02-07 MED ORDER — NIFEDIPINE ER OSMOTIC RELEASE 60 MG PO TB24
60.0000 mg | ORAL_TABLET | Freq: Every day | ORAL | Status: DC
  Administered 2016-02-07 – 2016-02-11 (×5): 60 mg via ORAL
  Filled 2016-02-07 (×5): qty 1

## 2016-02-07 MED ORDER — LISINOPRIL 20 MG PO TABS
40.0000 mg | ORAL_TABLET | Freq: Every day | ORAL | Status: DC
  Administered 2016-02-07 – 2016-02-10 (×3): 40 mg via ORAL
  Filled 2016-02-07 (×5): qty 2

## 2016-02-07 MED ORDER — PHYTONADIONE 5 MG PO TABS
5.0000 mg | ORAL_TABLET | Freq: Once | ORAL | Status: DC
Start: 2016-02-07 — End: 2016-02-09
  Filled 2016-02-07 (×2): qty 1

## 2016-02-07 MED ORDER — TEMAZEPAM 15 MG PO CAPS
30.0000 mg | ORAL_CAPSULE | Freq: Every evening | ORAL | Status: DC | PRN
  Administered 2016-02-07 – 2016-02-10 (×4): 30 mg via ORAL
  Filled 2016-02-07 (×4): qty 2

## 2016-02-07 MED ORDER — LEVOTHYROXINE SODIUM 100 MCG PO TABS
100.0000 ug | ORAL_TABLET | Freq: Every day | ORAL | Status: DC
  Administered 2016-02-08 – 2016-02-09 (×2): 100 ug via ORAL
  Filled 2016-02-07 (×2): qty 1

## 2016-02-07 MED ORDER — VITAMIN K1 10 MG/ML IJ SOLN
10.0000 mg | Freq: Once | INTRAVENOUS | Status: DC
  Filled 2016-02-07: qty 1

## 2016-02-07 MED ORDER — ONDANSETRON HCL 4 MG PO TABS: 4.0000 mg | ORAL_TABLET | Freq: Four times a day (QID) | ORAL | Status: DC | PRN

## 2016-02-07 NOTE — ED Notes (Addendum)
Pt arrived via ems from home where she has been progressively becoming more weak since the 20th of June. Pt states difficulty performing ADLs. Pt states "it doesn't take much of anything" before becoming sweaty and tired. Pt had pacemaker placed in 2006 and follows up with cardiologist regularly. Pt had stroke in May and placed on Warfarin. Pt complains of having a new weakness on right arm where her movement is very limited. Weak grip strength present upon assessment. All peripheral pulses present.

## 2016-02-07 NOTE — Progress Notes (Signed)
ANTICOAGULATION CONSULT NOTE - Initial Consult  Pharmacy Consult for Warfarin - continuation of therapy Indication: stroke  Allergies  Allergen Reactions  . Morphine And Related Itching  . Percocet [Oxycodone-Acetaminophen] Other (See Comments)    Hallucinations     Patient Measurements: Height: 5' (152.4 cm) Weight: 171 lb (77.565 kg) IBW/kg (Calculated) : 45.5 Heparin Dosing Weight: na  Vital Signs: Temp: 97.9 F (36.6 C) (07/05 1603) Temp Source: Oral (07/05 1603) BP: 129/50 mmHg (07/05 1603) Pulse Rate: 65 (07/05 1603)  Labs:  Recent Labs  02/07/16 1136 02/07/16 1308  HGB 6.8*  --   HCT 20.5*  --   PLT 439  --   LABPROT  --  >90.0*  INR  --  >10.00*  CREATININE 2.63*  --   TROPONINI <0.03  --     Estimated Creatinine Clearance: 19.4 mL/min (by C-G formula based on Cr of 2.63).   Medical History: Past Medical History  Diagnosis Date  . Diabetes mellitus without complication ([place])   . Hypertension   . Pacemaker     Medications:  Prescriptions prior to admission  Medication Sig Dispense Refill Last Dose  . amLODipine (NORVASC) 5 MG tablet Take 5 mg by mouth daily.   unknown at unknown  . furosemide (LASIX) 20 MG tablet Take 20 mg by mouth daily.   unknown at unknown  . gabapentin (NEURONTIN) 300 MG capsule Take 300 mg by mouth 2 (two) times daily.   unknown at unknown  . levothyroxine (SYNTHROID, LEVOTHROID) 50 MCG tablet Take 50 mcg by mouth daily before breakfast.   unknown at unknown  . metoprolol succinate (TOPROL-XL) 100 MG 24 hr tablet Take 100 mg by mouth daily. Take with or immediately following a meal.   unknown at unknown  . omeprazole (PRILOSEC) 20 MG capsule Take 20 mg by mouth 2 (two) times daily before a meal.   unknown at unknown  . SitaGLIPtin-MetFORMIN HCl (JANUMET XR) [phone] MG TB24 Take 1 tablet by mouth daily.   unknown at unknown  . temazepam (RESTORIL) 30 MG capsule Take 30 mg by mouth at bedtime as needed for sleep.   unknown at  unknown  . traMADol (ULTRAM) 50 MG tablet Take 50 mg by mouth 2 (two) times daily.   unknown at unknown  . warfarin (COUMADIN) 5 MG tablet Take 5 mg by mouth daily.   unknown at unknown    Assessment: Patient admitted for symptomatic anemia and supratherapeutic INR of > 10.   Goal of Therapy:  INR 2-3 Monitor platelets by anticoagulation protocol: Yes   Plan:  Will monitor daily INR and restart Coumadin when INR is within goal range. No coumadin to be given today.  Paulina Fusi, PharmD, BCPS 02/07/2016 5:53 PM

## 2016-02-07 NOTE — ED Provider Notes (Signed)
[facility] Emergency Department Provider Note  ____________________________________________  Time seen: 11:35 AM  I have reviewed the triage vital signs and the nursing notes.   HISTORY  Chief Complaint Weakness    HPI Renee Boyd is a 65 y.o. female complains of generalized weakness for the past 2 weeks. She saw her primary care doctor Dr. Rebecka Apley 1 week ago who discontinued nifedipine and amiodarone and started her on amlodipine. She also takes Lasix chronically. She reports that she's been in her bed for the last 3 days because any time she gets up or tries to walk around she gets very fatigued quickly and breaks out in a sweat. She denies chest pain shortness of breath abdominal pain or back pain. She reports she has fallen but no significant head injuries or trauma. She is using Ace wraps on bilateral lower extremities for swelling, which she reports is improved from where it normally is.     Past Medical History  Diagnosis Date  . Diabetes mellitus without complication ([place])   . Hypertension   . Pacemaker      There are no active problems to display for this patient.    Past Surgical History  Procedure Laterality Date  . Abdominal hysterectomy       Current Outpatient Rx  Name  Route  Sig  Dispense  Refill  . amiodarone (PACERONE) 200 MG tablet   Oral   Take 100 mg by mouth daily.         . cloNIDine (CATAPRES) 0.2 MG tablet   Oral   Take 0.2 mg by mouth at bedtime. Take two tablets at bedtime         . gabapentin (NEURONTIN) 300 MG capsule   Oral   Take 300 mg by mouth 2 (two) times daily.         . lansoprazole (PREVACID) 30 MG capsule   Oral   Take 30 mg by mouth daily.         Marland Kitchen levothyroxine (SYNTHROID, LEVOTHROID) 100 MCG tablet   Oral   Take 100 mcg by mouth daily before breakfast.         . lisinopril (PRINIVIL,ZESTRIL) 40 MG tablet   Oral   Take 40 mg by mouth daily.         . metoprolol succinate  (TOPROL-XL) 100 MG 24 hr tablet   Oral   Take 100 mg by mouth daily. Take with or immediately following a meal.         . NIFEdipine (PROCARDIA-XL/ADALAT CC) 60 MG 24 hr tablet   Oral   Take 60 mg by mouth daily.         . sitaGLIPtin-metformin (JANUMET) 50-500 MG per tablet   Oral   Take 1 tablet by mouth 2 (two) times daily with a meal.         . sucralfate (CARAFATE) 1 G tablet   Oral   Take 1 g by mouth 2 (two) times daily.         . temazepam (RESTORIL) 30 MG capsule   Oral   Take 30 mg by mouth at bedtime as needed for sleep.            Allergies Morphine and related and Percocet   No family history on file.  Social History Social History  Substance Use Topics  . Smoking status: Former Research scientist (life sciences)  . Smokeless tobacco: Never Used     Comment: quit Jun 27 2005  . Alcohol  Use: No    Review of Systems  Constitutional:   No fever or chills.  ENT:   No sore throat. No rhinorrhea. Cardiovascular:   No chest pain. Respiratory:   No dyspnea or cough. Gastrointestinal:   Negative for abdominal pain, vomiting and diarrhea.  Genitourinary:   Negative for dysuria or difficulty urinating. Musculoskeletal:   Right forearm bruising Neurological:   Negative for headaches 10-point ROS otherwise negative.  ____________________________________________   PHYSICAL EXAM:  VITAL SIGNS: ED Triage Vitals  Enc Vitals Group     BP 02/07/16 1121 122/37 mmHg     Pulse Rate 02/07/16 1121 63     Resp 02/07/16 1121 17     Temp 02/07/16 1121 97.6 F (36.4 C)     Temp Source 02/07/16 1121 Oral     SpO2 02/07/16 1121 98 %     Weight 02/07/16 1121 171 lb (77.565 kg)     Height 02/07/16 1121 5' (1.524 m)     Head Cir --      Peak Flow --      Pain Score 02/07/16 1122 7     Pain Loc --      Pain Edu? --      Excl. in [place]? --     Vital signs reviewed, nursing assessments reviewed.   Constitutional:   Alert and oriented. Well appearing and in no distress. Eyes:   No  scleral icterus. No conjunctival pallor. PERRL. EOMI.  No nystagmus. ENT   Head:   Normocephalic and atraumatic.   Nose:   No congestion/rhinnorhea. No septal hematoma   Mouth/Throat:   Dry mucous membranes, no pharyngeal erythema. No peritonsillar mass.    Neck:   No stridor. No SubQ emphysema. No meningismus. Hematological/Lymphatic/Immunilogical:   No cervical lymphadenopathy. Cardiovascular:   RRR. Symmetric bilateral radial and DP pulses. Radial pulses are one plus, dorsalis pedis pulses are 2+.  No murmurs.  Respiratory:   Normal respiratory effort without tachypnea nor retractions. Breath sounds are clear and equal bilaterally. No wheezes/rales/rhonchi. Gastrointestinal:   Soft and nontender. Non distended. There is no CVA tenderness.  No rebound, rigidity, or guarding. Rectal exam performed with nurse Kendal at bedside, brown stool, Hemoccult negative, QC controls okay Genitourinary:   deferred Musculoskeletal:   Nontender with normal range of motion in all extremities. Symmetric trace peripheral edema in bilateral lower extremities below the knees. No inflammatory changes. Right forearm has diffuse ecchymosis but compartments are soft and nontender. Some ecchymoses scattered on the bilateral lower extremities Neurologic:   Normal speech and language.  CN 2-10 normal. Motor grossly intact. No gross focal neurologic deficits are appreciated.  Skin:    Skin is warm, dry and intact. No ecchymosis on right forearm as above. No rash..  ____________________________________________    LABS (pertinent positives/negatives) (all labs ordered are listed, but only abnormal results are displayed) Labs Reviewed  COMPREHENSIVE METABOLIC PANEL - Abnormal; Notable for the following:    CO2 19 (*)    Glucose, Bld 129 (*)    BUN 60 (*)    Creatinine, Ser 2.63 (*)    Albumin 3.3 (*)    ALT 10 (*)    GFR calc non Af Amer 18 (*)    GFR calc Af Amer 21 (*)    All other components  within normal limits  CBC WITH DIFFERENTIAL/PLATELET - Abnormal; Notable for the following:    RBC 2.36 (*)    Hemoglobin 6.8 (*)    HCT 20.5 (*)  RDW 15.6 (*)    Neutro Abs 8.1 (*)    All other components within normal limits  URINALYSIS COMPLETEWITH MICROSCOPIC ([facility] ONLY) - Abnormal; Notable for the following:    Color, Urine YELLOW (*)    APPearance CLEAR (*)    Leukocytes, UA TRACE (*)    Bacteria, UA RARE (*)    Squamous Epithelial / LPF 0-5 (*)    All other components within normal limits  MAGNESIUM  PHOSPHORUS  TROPONIN I  TYPE AND SCREEN  TYPE AND SCREEN  TYPE AND SCREEN  TYPE AND SCREEN  PREPARE RBC (CROSSMATCH)   ____________________________________________   EKG  Interpreted by me Rate of 69, left axis, left bundle branch block. No acute ischemic changes.  ____________________________________________    RADIOLOGY  Chest x-ray unremarkable Ultrasound right upper extremity pending  ____________________________________________   PROCEDURES  Peripheral IV insertion by physician Indication: Multiple failed attempts by nursing staff, need for IV access and/or blood samples for workup Performed under continuous real-time ultrasound visualization Area cleaned with chlorhexidine. 20-gauge IV successfully placed in the Left antecubital fossa. 1 attempt, no complications, EBL 0.   CRITICAL CARE Performed by: Joni Fears, Wm Fruchter   Total critical care time: 35 minutes  Critical care time was exclusive of separately billable procedures and treating other patients.  Critical care was necessary to treat or prevent imminent or life-threatening deterioration.  Critical care was time spent personally by me on the following activities: development of treatment plan with patient and/or surrogate as well as nursing, discussions with consultants, evaluation of patient's response to treatment, examination of patient, obtaining history from patient or surrogate,  ordering and performing treatments and interventions, ordering and review of laboratory studies, ordering and review of radiographic studies, pulse oximetry and re-evaluation of patient's condition.  ____________________________________________   INITIAL IMPRESSION / ASSESSMENT AND PLAN / ED COURSE  Pertinent labs & imaging results that were available during my care of the patient were reviewed by me and considered in my medical decision making (see chart for details).  Patient not in distress. Clinically appears to be dehydrated. We'll give IV saline while checking labs urinalysis chest x-ray. Low suspicion for ACS PE dissection pneumonia or sepsis.   ----------------------------------------- 1:49 PM on 02/07/2016 ----------------------------------------- Labs significant for hemoglobin of 6.8. Patient denies any history of anemia, denies black tarry stool and in fact reports that her stools been pale recently. Also has a creatinine of 2.6 with BUN of 60. It appears this is symptomatic anemia of unknown origin, associated with acute renal failure. No prior labs available for comparison in electronic medical record. Risks and benefits of blood transfusion discussed with the patient, she verbally consents. Type and screen sent, will plan for transfusion and admission. Continue IV fluids in the meantime.   ----------------------------------------- 2:38 PM on 02/07/2016 -----------------------------------------  INR greater than 10. Case discussed with hospitalist for admission. We'll give IV vitamin K for now. No clear bleeding source other than in the soft tissues that she has ecchymoses of her multiple extremities..    ____________________________________________   FINAL CLINICAL IMPRESSION(S) / ED DIAGNOSES  Final diagnoses:  Arm pain  Ecchymosis  Symptomatic anemia  Acute renal failure, unspecified acute renal failure type [facility])       Portions of this note were generated  with dragon dictation software. Dictation errors may occur despite best attempts at proofreading.   Carrie Mew, MD 02/07/16 [phone]

## 2016-02-07 NOTE — ED Notes (Signed)
Patient transported to X-ray 

## 2016-02-07 NOTE — H&P (Signed)
[facility] [place] NAME: Renee Boyd    MR#:  [id number]  DATE OF BIRTH:  1950/08/31   DATE OF ADMISSION:  02/07/2016  PRIMARY CARE PHYSICIAN: Cletis Athens, MD   REQUESTING/REFERRING PHYSICIAN: Joni Fears  CHIEF COMPLAINT:   Chief Complaint  Patient presents with  . Weakness    HISTORY OF PRESENT ILLNESS:  Renee Boyd  is a 65 y.o. female with a known history of Recent CVA 12/18/2015 started on warfarin at that time who is presenting with progressive weakness and fatigue. States she has been getting progressively weak and tired with increased falls. Follows she has noticed large amounts of bruising bilateral arms and back side without any frank bleeding. She states that she does have some dyspnea on exertion but also gets fatigued and "dizzy" with minimal work. With the above symptoms decided to present to Hospital further workup and evaluation found to be anemic with supratherapeutic INR-fecal occult blood test negative  PAST MEDICAL HISTORY:   Past Medical History  Diagnosis Date  . Diabetes mellitus without complication ([place])   . Hypertension   . Pacemaker     PAST SURGICAL HISTORY:   Past Surgical History  Procedure Laterality Date  . Abdominal hysterectomy      SOCIAL HISTORY:   Social History  Substance Use Topics  . Smoking status: Former Research scientist (life sciences)  . Smokeless tobacco: Never Used     Comment: quit Jun 27 2005  . Alcohol Use: No    FAMILY HISTORY:   Family History  Problem Relation Age of Onset  . Hypertension Other     DRUG ALLERGIES:   Allergies  Allergen Reactions  . Morphine And Related Itching  . Percocet [Oxycodone-Acetaminophen] Other (See Comments)    Hallucinations     REVIEW OF SYSTEMS:  REVIEW OF SYSTEMS:  CONSTITUTIONAL: Denies fevers, chills, Positive fatigue, weakness.  EYES: Denies blurred vision, double vision, or eye pain.  EARS, NOSE, THROAT: Denies tinnitus, ear pain, hearing loss.    RESPIRATORY: denies cough, shortness of breath, wheezing  CARDIOVASCULAR: Denies chest pain, palpitations, edema. Positive shortness of breath on exertion  GASTROINTESTINAL: Denies nausea, vomiting, diarrhea, abdominal pain.  GENITOURINARY: Denies dysuria, hematuria.  ENDOCRINE: Denies nocturia or thyroid problems. HEMATOLOGIC AND LYMPHATIC: Positive easy bruising denies bleeding.  SKIN: Denies rash or lesions.  MUSCULOSKELETAL: Denies pain in neck, back, shoulder, knees, hips, or further arthritic symptoms.  NEUROLOGIC: Denies paralysis, paresthesias.  PSYCHIATRIC: Denies anxiety or depressive symptoms. Otherwise full review of systems performed by me is negative.   MEDICATIONS AT HOME:   Prior to Admission medications   Medication Sig Start Date End Date Taking? Authorizing Provider  amLODipine (NORVASC) 5 MG tablet Take 5 mg by mouth daily.   Yes Historical Provider, MD  furosemide (LASIX) 20 MG tablet Take 20 mg by mouth daily.   Yes Historical Provider, MD  gabapentin (NEURONTIN) 300 MG capsule Take 300 mg by mouth 2 (two) times daily.   Yes Historical Provider, MD  levothyroxine (SYNTHROID, LEVOTHROID) 50 MCG tablet Take 50 mcg by mouth daily before breakfast.   Yes Historical Provider, MD  metoprolol succinate (TOPROL-XL) 100 MG 24 hr tablet Take 100 mg by mouth daily. Take with or immediately following a meal.   Yes Historical Provider, MD  omeprazole (PRILOSEC) 20 MG capsule Take 20 mg by mouth 2 (two) times daily before a meal.   Yes Historical Provider, MD  SitaGLIPtin-MetFORMIN HCl (JANUMET XR) [phone] MG TB24 Take 1 tablet  by mouth daily.   Yes Historical Provider, MD  temazepam (RESTORIL) 30 MG capsule Take 30 mg by mouth at bedtime as needed for sleep.   Yes Historical Provider, MD  traMADol (ULTRAM) 50 MG tablet Take 50 mg by mouth 2 (two) times daily.   Yes Historical Provider, MD  warfarin (COUMADIN) 5 MG tablet Take 5 mg by mouth daily.   Yes Historical Provider, MD       VITAL SIGNS:  Blood pressure 112/66, pulse 67, temperature 97.6 F (36.4 C), temperature source Oral, resp. rate 17, height 5' (1.524 m), weight 171 lb (77.565 kg), SpO2 100 %.  PHYSICAL EXAMINATION:  VITAL SIGNS: Filed Vitals:   02/07/16 1121 02/07/16 1330  BP: 122/37 112/66  Pulse: 63 67  Temp: 97.6 F (36.4 C)   Resp: 17 88   GENERAL:64 y.o.female currently in no acute distress.  HEAD: Normocephalic, atraumatic.  EYES: Pupils equal, round, reactive to light. Extraocular muscles intact. No scleral icterus.  MOUTH: Moist mucosal membrane. Dentition intact. No abscess noted.  EAR, NOSE, THROAT: Clear without exudates. No external lesions.  NECK: Supple. No thyromegaly. No nodules. No JVD.  PULMONARY: Clear to ascultation, without wheeze rails or rhonci. No use of accessory muscles, Good respiratory effort. good air entry bilaterally CHEST: Nontender to palpation.  CARDIOVASCULAR: S1 and S2. Regular rate and rhythm. No murmurs, rubs, or gallops. No edema. Pedal pulses 2+ bilaterally.  GASTROINTESTINAL: Soft, nontender, nondistended. No masses. Positive bowel sounds. No hepatosplenomegaly.  MUSCULOSKELETAL: No swelling, clubbing, or edema. Range of motion full in all extremities.  NEUROLOGIC: Cranial nerves II through XII are intact. No gross focal neurological deficits. Sensation intact. Reflexes intact.  SKIN: Multiple areas of ecchymosis most prominent entire distal right arm No ulceration, lesions, rashes, or cyanosis. Skin warm and dry. Turgor intact.  PSYCHIATRIC: Mood, affect within normal limits. The patient is awake, alert and oriented x 3. Insight, judgment intact.    LABORATORY PANEL:   CBC  Recent Labs Lab 02/07/16 1136  WBC 10.4  HGB 6.8*  HCT 20.5*  PLT 439   ------------------------------------------------------------------------------------------------------------------  Chemistries   Recent Labs Lab 02/07/16 1136  NA 136  K 4.6  CL 106  CO2  19*  GLUCOSE 129*  BUN 60*  CREATININE 2.63*  CALCIUM 9.2  MG 1.8  AST 20  ALT 10*  ALKPHOS 60  BILITOT 0.7   ------------------------------------------------------------------------------------------------------------------  Cardiac Enzymes  Recent Labs Lab 02/07/16 1136  TROPONINI <0.03   ------------------------------------------------------------------------------------------------------------------  RADIOLOGY:    EKG:   Orders placed or performed during the hospital encounter of 02/07/16  . ED EKG  . ED EKG    IMPRESSION AND PLAN:   65 year old African-American female history of recent CVA placed on warfarin presenting with fatigue  1. Symptomatic anemia in setting of supratherapeutic INR and multiple areas of ecchymosis: Ultrasound upper extremity performed revealing hematoma, ordered to receive vitamin K in the ER, follow INR, hemoglobin, transfuse packed red blood cells, no active bleed at this time, we'll consult pharmacy to assist in warfarin dosing 2. Hypothyroidism unspecified Synthroid 3. GERD without esophagitis PPI therapy 4. Type 2 diabetes non-insulin-requiring hold oral agents at insulin sliding scale 5. Hypertension essential: Norvasc    All the records are reviewed and case discussed with ED provider. Management plans discussed with the patient, family and they are in agreement.  CODE STATUS: Full  TOTAL TIME TAKING CARE OF THIS PATIENT: 35 minutes.    Hower,  Karenann Cai.D on 02/07/2016 at  3:37 PM  Between 7am to 6pm - Pager - [phone]  After 6pm: House Pager: - [phone]  [facility]  Office  [phone]  CC: Primary care physician; Cletis Athens, MD

## 2016-02-07 NOTE — Progress Notes (Signed)
MD notified of pt c/o bilateral leg pain and requests to take tramadol and tylenol together to see if she will get relief. Received orders for both meds but to give them at separate times. BP also running low, md notified and she is okay with holding nightime BP meds. Will continue to monitor.

## 2016-02-08 ENCOUNTER — Inpatient Hospital Stay (HOSPITAL_COMMUNITY)
Admit: 2016-02-08 | Discharge: 2016-02-08 | Disposition: A | Discharge status: Home / Self Care | Payer: Medicare Other | Attending: Internal Medicine | Admitting: Internal Medicine

## 2016-02-08 ENCOUNTER — Inpatient Hospital Stay: Payer: Medicare Other

## 2016-02-08 DIAGNOSIS — M7981 Nontraumatic hematoma of soft tissue: Secondary | ICD-10-CM | POA: Diagnosis present

## 2016-02-08 DIAGNOSIS — I10 Essential (primary) hypertension: Secondary | ICD-10-CM | POA: Diagnosis not present

## 2016-02-08 DIAGNOSIS — I4892 Unspecified atrial flutter: Secondary | ICD-10-CM | POA: Diagnosis not present

## 2016-02-08 DIAGNOSIS — R791 Abnormal coagulation profile: Secondary | ICD-10-CM | POA: Diagnosis not present

## 2016-02-08 DIAGNOSIS — T45515A Adverse effect of anticoagulants, initial encounter: Secondary | ICD-10-CM | POA: Diagnosis present

## 2016-02-08 DIAGNOSIS — D649 Anemia, unspecified: Secondary | ICD-10-CM | POA: Diagnosis present

## 2016-02-08 DIAGNOSIS — I129 Hypertensive chronic kidney disease with stage 1 through stage 4 chronic kidney disease, or unspecified chronic kidney disease: Secondary | ICD-10-CM | POA: Diagnosis present

## 2016-02-08 DIAGNOSIS — Z87891 Personal history of nicotine dependence: Secondary | ICD-10-CM | POA: Diagnosis not present

## 2016-02-08 DIAGNOSIS — I5031 Acute diastolic (congestive) heart failure: Secondary | ICD-10-CM | POA: Diagnosis not present

## 2016-02-08 DIAGNOSIS — J9601 Acute respiratory failure with hypoxia: Secondary | ICD-10-CM | POA: Diagnosis not present

## 2016-02-08 DIAGNOSIS — Z8249 Family history of ischemic heart disease and other diseases of the circulatory system: Secondary | ICD-10-CM | POA: Diagnosis not present

## 2016-02-08 DIAGNOSIS — Z79899 Other long term (current) drug therapy: Secondary | ICD-10-CM | POA: Diagnosis not present

## 2016-02-08 DIAGNOSIS — E1122 Type 2 diabetes mellitus with diabetic chronic kidney disease: Secondary | ICD-10-CM | POA: Diagnosis present

## 2016-02-08 DIAGNOSIS — N179 Acute kidney failure, unspecified: Secondary | ICD-10-CM | POA: Diagnosis present

## 2016-02-08 DIAGNOSIS — J811 Chronic pulmonary edema: Secondary | ICD-10-CM | POA: Diagnosis present

## 2016-02-08 DIAGNOSIS — R0902 Hypoxemia: Secondary | ICD-10-CM | POA: Diagnosis not present

## 2016-02-08 DIAGNOSIS — Z950 Presence of cardiac pacemaker: Secondary | ICD-10-CM | POA: Diagnosis not present

## 2016-02-08 DIAGNOSIS — Z90710 Acquired absence of both cervix and uterus: Secondary | ICD-10-CM | POA: Diagnosis not present

## 2016-02-08 DIAGNOSIS — Z8673 Personal history of transient ischemic attack (TIA), and cerebral infarction without residual deficits: Secondary | ICD-10-CM | POA: Diagnosis not present

## 2016-02-08 DIAGNOSIS — M79603 Pain in arm, unspecified: Secondary | ICD-10-CM | POA: Diagnosis not present

## 2016-02-08 DIAGNOSIS — K219 Gastro-esophageal reflux disease without esophagitis: Secondary | ICD-10-CM | POA: Diagnosis present

## 2016-02-08 DIAGNOSIS — D6832 Hemorrhagic disorder due to extrinsic circulating anticoagulants: Secondary | ICD-10-CM | POA: Diagnosis present

## 2016-02-08 DIAGNOSIS — N183 Chronic kidney disease, stage 3 (moderate): Secondary | ICD-10-CM | POA: Diagnosis present

## 2016-02-08 DIAGNOSIS — Z7901 Long term (current) use of anticoagulants: Secondary | ICD-10-CM | POA: Diagnosis not present

## 2016-02-08 DIAGNOSIS — Z885 Allergy status to narcotic agent status: Secondary | ICD-10-CM | POA: Diagnosis not present

## 2016-02-08 DIAGNOSIS — E039 Hypothyroidism, unspecified: Secondary | ICD-10-CM | POA: Diagnosis present

## 2016-02-08 LAB — ECHOCARDIOGRAM COMPLETE
AV Area mean vel: 1.75 cm2
AV Mean grad: 14 mmHg
AV VEL mean LVOT/AV: 0.56
AV area mean vel ind: 1 cm2/m2
AV peak Index: 0.9
AV pk vel: 266 cm/s
AV vel: 1.71
AVA: 1.71 cm2
AVAREAVTI: 1.57 cm2
AVAREAVTIIND: 0.98 cm2/m2
AVPG: 28 mmHg
Ao pk vel: 0.5 m/s
CHL CUP DOP CALC LVOT VTI: 24.1 cm
DOP CAL AO MEAN VELOCITY: 166 cm/s
EWDT: 259 ms
FS: 35 % (ref 28–44)
HEIGHTINCHES: 60 in
IVS/LV PW RATIO, ED: 0.94
LA diam end sys: 40 mm
LA diam index: 2.29 cm/m2
LA vol A4C: 56.4 ml
LASIZE: 40 mm
LV PW d: 13.5 mm — AB (ref 0.6–1.1)
LVOT SV: 76 mL
LVOT area: 3.14 cm2
LVOT peak VTI: 0.55 cm
LVOT peak grad rest: 7 mmHg
LVOT peak vel: 133 cm/s
LVOTD: 20 mm
MV Dec: 259
MVPG: 8 mmHg
MVPKEVEL: 139 m/s
VTI: 44.2 cm
Valve area index: 0.98
WEIGHTICAEL: 2736 [oz_av]

## 2016-02-08 LAB — PROTIME-INR
INR: 5.41
Prothrombin Time: 47.7 seconds — ABNORMAL HIGH (ref 11.4–15.0)

## 2016-02-08 LAB — BASIC METABOLIC PANEL
ANION GAP: 8 (ref 5–15)
BUN: 53 mg/dL — AB (ref 6–20)
CHLORIDE: 110 mmol/L (ref 101–111)
CO2: 20 mmol/L — AB (ref 22–32)
Calcium: 8.8 mg/dL — ABNORMAL LOW (ref 8.9–10.3)
Creatinine, Ser: 1.89 mg/dL — ABNORMAL HIGH (ref 0.44–1.00)
GFR calc Af Amer: 31 mL/min — ABNORMAL LOW (ref >60)
GFR, EST NON AFRICAN AMERICAN: 27 mL/min — AB (ref >60)
GLUCOSE: 139 mg/dL — AB (ref 65–99)
POTASSIUM: 4.4 mmol/L (ref 3.5–5.1)
Sodium: 138 mmol/L (ref 135–145)

## 2016-02-08 LAB — GLUCOSE, CAPILLARY
GLUCOSE-CAPILLARY: 194 mg/dL — AB (ref 65–99)
Glucose-Capillary: 153 mg/dL — ABNORMAL HIGH (ref 65–99)
Glucose-Capillary: 161 mg/dL — ABNORMAL HIGH (ref 65–99)
Glucose-Capillary: 165 mg/dL — ABNORMAL HIGH (ref 65–99)
Glucose-Capillary: 189 mg/dL — ABNORMAL HIGH (ref 65–99)

## 2016-02-08 LAB — BRAIN NATRIURETIC PEPTIDE: B NATRIURETIC PEPTIDE 5: 154 pg/mL — AB (ref 0.0–100.0)

## 2016-02-08 LAB — CBC
HEMATOCRIT: 21.1 % — AB (ref 35.0–47.0)
HEMOGLOBIN: 7.1 g/dL — AB (ref 12.0–16.0)
MCH: 29.2 pg (ref 26.0–34.0)
MCHC: 33.6 g/dL (ref 32.0–36.0)
MCV: 87.1 fL (ref 80.0–100.0)
Platelets: 360 10*3/uL (ref 150–440)
RBC: 2.43 MIL/uL — AB (ref 3.80–5.20)
RDW: 14.8 % — ABNORMAL HIGH (ref 11.5–14.5)
WBC: 8.7 10*3/uL (ref 3.6–11.0)

## 2016-02-08 LAB — HEMOGLOBIN A1C

## 2016-02-08 MED ORDER — ALUM & MAG HYDROXIDE-SIMETH 200-200-20 MG/5ML PO SUSP: 15.0000 mL | ORAL | Status: DC | PRN

## 2016-02-08 MED ORDER — LABETALOL HCL 5 MG/ML IV SOLN
10.0000 mg | INTRAVENOUS | Status: DC | PRN
  Filled 2016-02-08: qty 4

## 2016-02-08 MED ORDER — LORAZEPAM 2 MG/ML IJ SOLN: 0.5000 mg | Freq: Once | INTRAMUSCULAR | Status: AC

## 2016-02-08 MED ORDER — FUROSEMIDE 10 MG/ML IJ SOLN
40.0000 mg | Freq: Two times a day (BID) | INTRAMUSCULAR | Status: DC
  Administered 2016-02-08: 40 mg via INTRAVENOUS
  Filled 2016-02-08: qty 4

## 2016-02-08 MED ORDER — FUROSEMIDE 10 MG/ML IJ SOLN
40.0000 mg | Freq: Once | INTRAMUSCULAR | Status: AC
Start: 2016-02-08 — End: 2016-02-08

## 2016-02-08 MED ORDER — LORAZEPAM 2 MG/ML IJ SOLN
INTRAMUSCULAR | Status: AC
  Administered 2016-02-08: 10:00:00
  Filled 2016-02-08: qty 1

## 2016-02-08 MED ORDER — IPRATROPIUM-ALBUTEROL 0.5-2.5 (3) MG/3ML IN SOLN: 3.0000 mL | RESPIRATORY_TRACT | Status: DC | PRN

## 2016-02-08 MED ORDER — LORAZEPAM 0.5 MG PO TABS: 0.5000 mg | ORAL_TABLET | Freq: Four times a day (QID) | ORAL | Status: DC | PRN

## 2016-02-08 MED ORDER — FUROSEMIDE 10 MG/ML IJ SOLN
INTRAMUSCULAR | Status: AC
  Administered 2016-02-08: 10:00:00
  Filled 2016-02-08: qty 4

## 2016-02-08 MED ORDER — IPRATROPIUM-ALBUTEROL 0.5-2.5 (3) MG/3ML IN SOLN
3.0000 mL | Freq: Four times a day (QID) | RESPIRATORY_TRACT | Status: DC | PRN
  Administered 2016-02-08: 3 mL via RESPIRATORY_TRACT
  Filled 2016-02-08: qty 3

## 2016-02-08 NOTE — Progress Notes (Signed)
Pt became short of breath, sweaty, hypertensive, and tachycardic at the end of the second unit of PRBCs. HR in the low 100s; BP was [id number] systolic. Patient placed on 2 Liters O2 acute for comfort. Transfusion was stopped and NS given to flush line and to rule out transfusion reaction. Rapid Response was called. Dr. Darvin Neighbours notified. Respiratory therapy notified. Lung sounds were wheezy and course. NS was stopped. Lasix 40 mg IV was ordered by Dr. Darvin Neighbours STAT and ativan 0.5 mg IV ordered once for patient anxiety. Meds given. CXR ordered. Patient found to have mild fluid overload. Will continue to assess. Patient resting now.

## 2016-02-08 NOTE — Progress Notes (Signed)
Dr. Darvin Neighbours text-paged regarding patient's critical lab value for INR of 5.41.  PT consult also requested due to patient's weakness.

## 2016-02-08 NOTE — Progress Notes (Signed)
Md notified of pt c/o right sided chest pains after plasma infusion. MD ordered ekg stat and came to see patient. Pt later admitted the pain was not really in her chest but the right side of her back under her shoulder blade and was more like cramping. Pt thinks it may be from lying in bed or possible gas pains. Orders for maalox (for indigestion) and K Pad for back placed. Pt now states her pain is resolved and she no longer needs maalox or other interventions. MD aware of pt low DBP also. Wants HGB post transfusion of the first unit of blood. Will continue to monitor.

## 2016-02-08 NOTE — Progress Notes (Signed)
[facility] at [place] NAME: Yorleni Hibdon    MR#:  [id number]  DATE OF BIRTH:  1951/06/23  SUBJECTIVE:  CHIEF COMPLAINT:   Chief Complaint  Patient presents with  . Weakness   Admitted for weakness and found to have anemia due to diffuse bruising from coumadin. Stool negative for blood.  Rapid response called today and patient acutely SOB. Anxious and wants to sit up. Just finished 2nd unit PRBC.  NO h/o CHF/COPD  REVIEW OF SYSTEMS:    Review of Systems  Constitutional: Positive for malaise/fatigue.  Respiratory: Positive for cough, shortness of breath and wheezing.   Neurological: Positive for weakness.  Psychiatric/Behavioral: The patient is nervous/anxious.     DRUG ALLERGIES:   Allergies  Allergen Reactions  . Morphine And Related Itching  . Percocet [Oxycodone-Acetaminophen] Other (See Comments)    Hallucinations     VITALS:  Blood pressure 194/72, pulse 59, temperature 97.8 F (36.6 C), temperature source Oral, resp. rate 24, height 5' (1.524 m), weight 77.565 kg (171 lb), SpO2 96 %.  PHYSICAL EXAMINATION:   Physical Exam  GENERAL:  65 y.o.-year-old patient lying in the bed with resp distress EYES: Pupils equal, round, reactive to light and accommodation. No scleral icterus. Extraocular muscles intact.  HEENT: Head atraumatic, normocephalic. Oropharynx and nasopharynx clear.  NECK:  Supple, no jugular venous distention. No thyroid enlargement, no tenderness.  LUNGS: Increased work of breathing with wheezing and crackles   CARDIOVASCULAR: S1, S2 normal. No murmurs, rubs, or gallops.  ABDOMEN: Soft, nontender, nondistended.  No organomegaly or mass.  EXTREMITIES: No cyanosis, clubbing or edema b/l.    NEUROLOGIC: Moves all 4 extremities. PSYCHIATRIC: The patient is alert and awake. Anxious SKIN: No obvious rash, lesion, or ulcer.  Bruising on arms  LABORATORY PANEL:   CBC  Recent Labs Lab 02/08/16 0328   WBC 8.7  HGB 7.1*  HCT 21.1*  PLT 360   ------------------------------------------------------------------------------------------------------------------ Chemistries   Recent Labs Lab 02/07/16 1136 02/08/16 0328  NA 136 138  K 4.6 4.4  CL 106 110  CO2 19* 20*  GLUCOSE 129* 139*  BUN 60* 53*  CREATININE 2.63* 1.89*  CALCIUM 9.2 8.8*  MG 1.8  --   AST 20  --   ALT 10*  --   ALKPHOS 60  --   BILITOT 0.7  --    ------------------------------------------------------------------------------------------------------------------  Cardiac Enzymes  Recent Labs Lab 02/07/16 1136  TROPONINI <0.03   ------------------------------------------------------------------------------------------------------------------  RADIOLOGY:     ASSESSMENT AND PLAN:   65 year old African-American female history of recent CVA placed on warfarin presenting with fatigue  * Acute hypoxic respiratory failure likely from pulm edema due to blood transfusion STAT Lasix, Duoneb, CXR. Check BNP. Echo. Monitor I/Os Wean O2 as tolerated  * Accelerated HTN Resume home meds. Lasix should help. Add PRN Labetalol  * Symptomatic anemia due to hematoma in arm due to supratherapeutic INR Stool negative for Hemoccult Transfuse 2 units PRBC. Monitor HB Coumadin held and INR improving. Pharmacy to dose  * h/o CVA ON Coumadin Statin  * CKD 3 Stable  * Hypothyroidism unspecified Synthroid  * GERD without esophagitis PPI therapy  * Type 2 diabetes non-insulin-requiring hold oral agents at insulin sliding scale  * Hypertension essential: Norvasc, clinidine  All the records are reviewed and case discussed with Care Management/Social Workerr. Management plans discussed with the patient, family and they are in agreement.  CODE STATUS: FULL CODE  DVT Prophylaxis: SCDs  TOTAL CRITICAL CARE TIME TAKING CARE OF THIS PATIENT: 35 minutes.   POSSIBLE D/C IN 2-3 DAYS, DEPENDING ON CLINICAL  CONDITION.  Hillary Bow R M.D on 02/08/2016 at 10:13 AM  Between 7am to 6pm - Pager - [phone]  After 6pm go to [email or web address] - password [email or web address] [facility]  Office  [phone]  CC: Primary care physician; Cletis Athens, MD  Note: This dictation was prepared with Dragon dictation along with smaller phrase technology. Any transcriptional errors that result from this process are unintentional.

## 2016-02-08 NOTE — Progress Notes (Signed)
PT Cancellation Note  Patient Details Name: Renee Boyd MRN: [id number] DOB: [birth year]   Cancelled Treatment:    Reason Eval/Treat Not Completed: Medical issues which prohibited therapy (elevated BP and hgb is 7.1, hold to await medical decision).  INR is critical as well.   Ramond Dial 02/08/2016, 9:51 AM    Mee Hives, PT MS Acute Rehab Dept. Number: [place] [facility] [employer]

## 2016-02-08 NOTE — Progress Notes (Addendum)
ANTICOAGULATION CONSULT NOTE -follow up [facility] for Warfarin - continuation of therapy Indication: stroke  Allergies  Allergen Reactions  . Morphine And Related Itching  . Percocet [Oxycodone-Acetaminophen] Other (See Comments)    Hallucinations     Patient Measurements: Height: 5' (152.4 cm) Weight: 171 lb (77.565 kg) IBW/kg (Calculated) : 45.5 Heparin Dosing Weight: na  Vital Signs: Temp: 98.1 F (36.7 C) (07/06 0702) Temp Source: Oral (07/06 0702) BP: 137/56 mmHg (07/06 0702) Pulse Rate: 59 (07/06 0702)  Labs:  Recent Labs  02/07/16 1136 02/07/16 1308 02/08/16 0328  HGB 6.8*  --  7.1*  HCT 20.5*  --  21.1*  PLT 439  --  360  LABPROT  --  >90.0* 47.7*  INR  --  >10.00* 5.41*  CREATININE 2.63*  --  1.89*  TROPONINI <0.03  --   --     Estimated Creatinine Clearance: 26.9 mL/min (by C-G formula based on Cr of 1.89).   Medical History: Past Medical History  Diagnosis Date  . Diabetes mellitus without complication ([place])   . Hypertension   . Pacemaker     Medications:  Prescriptions prior to admission  Medication Sig Dispense Refill Last Dose  . amLODipine (NORVASC) 5 MG tablet Take 5 mg by mouth daily.   unknown at unknown  . furosemide (LASIX) 20 MG tablet Take 20 mg by mouth daily.   unknown at unknown  . gabapentin (NEURONTIN) 300 MG capsule Take 300 mg by mouth 2 (two) times daily.   unknown at unknown  . levothyroxine (SYNTHROID, LEVOTHROID) 50 MCG tablet Take 50 mcg by mouth daily before breakfast.   unknown at unknown  . metoprolol succinate (TOPROL-XL) 100 MG 24 hr tablet Take 100 mg by mouth daily. Take with or immediately following a meal.   unknown at unknown  . omeprazole (PRILOSEC) 20 MG capsule Take 20 mg by mouth 2 (two) times daily before a meal.   unknown at unknown  . SitaGLIPtin-MetFORMIN HCl (JANUMET XR) [phone] MG TB24 Take 1 tablet by mouth daily.   unknown at unknown  . temazepam (RESTORIL) 30 MG capsule Take 30 mg  by mouth at bedtime as needed for sleep.   unknown at unknown  . traMADol (ULTRAM) 50 MG tablet Take 50 mg by mouth 2 (two) times daily.   unknown at unknown  . warfarin (COUMADIN) 5 MG tablet Take 5 mg by mouth daily.   unknown at unknown    Assessment: Patient admitted for symptomatic anemia and supratherapeutic INR of > 10.  On warfarin 5 mg daily at home for CVA.   7/5  INR  >10.0  Hold dose (Vit K 5mg  PO ordered 7/5 but not charted as given). 7/6  INR 5.41     Hold dose  Goal of Therapy:  INR 2-3 Monitor platelets by anticoagulation protocol: Yes   Plan:  INR supratherapeutic at 5.41. Continue to hold coumadin.  Will monitor daily INR and restart Coumadin when INR is within goal range. Patient on amiodarone.  Chinita Greenland PharmD Clinical Pharmacist 02/08/2016  8:29 AM

## 2016-02-08 NOTE — Progress Notes (Signed)
Rapid Response called, pt had been placed on 2lpm [place], respirations in the 30's. Breath sounds bilat rales with upper airway wheezes. MD wanted to give Duoneb for wheezes. Will continue to monitor.

## 2016-02-09 LAB — PREPARE FRESH FROZEN PLASMA
Unit division: 0
Unit division: 0

## 2016-02-09 LAB — TYPE AND SCREEN
ABO/RH(D): A POS
Antibody Screen: NEGATIVE
UNIT DIVISION: 0
UNIT DIVISION: 0

## 2016-02-09 LAB — PROTIME-INR
INR: 5.25 — AB
Prothrombin Time: 46.6 seconds — ABNORMAL HIGH (ref 11.4–15.0)

## 2016-02-09 LAB — MISC LABCORP TEST (SEND OUT): LABCORP TEST CODE: 1453

## 2016-02-09 LAB — GLUCOSE, CAPILLARY
GLUCOSE-CAPILLARY: 150 mg/dL — AB (ref 65–99)
Glucose-Capillary: 147 mg/dL — ABNORMAL HIGH (ref 65–99)
Glucose-Capillary: 243 mg/dL — ABNORMAL HIGH (ref 65–99)
Glucose-Capillary: 148 mg/dL — ABNORMAL HIGH (ref 65–99)

## 2016-02-09 LAB — HEMOGLOBIN: Hemoglobin: 9 g/dL — ABNORMAL LOW (ref 12.0–16.0)

## 2016-02-09 LAB — BASIC METABOLIC PANEL
ANION GAP: 8 (ref 5–15)
BUN: 33 mg/dL — AB (ref 6–20)
CHLORIDE: 109 mmol/L (ref 101–111)
CO2: 24 mmol/L (ref 22–32)
Calcium: 9.2 mg/dL (ref 8.9–10.3)
Creatinine, Ser: 1.33 mg/dL — ABNORMAL HIGH (ref 0.44–1.00)
GFR calc Af Amer: 47 mL/min — ABNORMAL LOW (ref >60)
GFR, EST NON AFRICAN AMERICAN: 41 mL/min — AB (ref >60)
GLUCOSE: 171 mg/dL — AB (ref 65–99)
POTASSIUM: 3.8 mmol/L (ref 3.5–5.1)
Sodium: 141 mmol/L (ref 135–145)

## 2016-02-09 MED ORDER — POLYETHYLENE GLYCOL 3350 17 G PO PACK
17.0000 g | PACK | Freq: Every day | ORAL | Status: DC
  Administered 2016-02-09 – 2016-02-11 (×3): 17 g via ORAL
  Filled 2016-02-09 (×3): qty 1

## 2016-02-09 MED ORDER — PHYTONADIONE 5 MG PO TABS
5.0000 mg | ORAL_TABLET | Freq: Once | ORAL | Status: AC
Start: 2016-02-09 — End: 2016-02-09
  Administered 2016-02-09: 5 mg via ORAL
  Filled 2016-02-09: qty 1

## 2016-02-09 MED ORDER — ACETAMINOPHEN 325 MG PO TABS: 650.0000 mg | ORAL_TABLET | Freq: Four times a day (QID) | ORAL | Status: DC | PRN

## 2016-02-09 MED ORDER — FUROSEMIDE 10 MG/ML IJ SOLN
60.0000 mg | Freq: Two times a day (BID) | INTRAMUSCULAR | Status: DC
  Administered 2016-02-09: 60 mg via INTRAVENOUS
  Filled 2016-02-09: qty 6

## 2016-02-09 MED ORDER — LEVOTHYROXINE SODIUM 50 MCG PO TABS
50.0000 ug | ORAL_TABLET | Freq: Every day | ORAL | Status: DC
  Administered 2016-02-10 – 2016-02-11 (×2): 50 ug via ORAL
  Filled 2016-02-09 (×3): qty 1

## 2016-02-09 MED ORDER — FUROSEMIDE 40 MG PO TABS
60.0000 mg | ORAL_TABLET | Freq: Once | ORAL | Status: AC
  Administered 2016-02-09: 60 mg via ORAL
  Filled 2016-02-09: qty 2

## 2016-02-09 NOTE — Progress Notes (Signed)
ANTICOAGULATION CONSULT NOTE -follow up [facility] for Warfarin - continuation of therapy Indication: stroke  Allergies  Allergen Reactions  . Morphine And Related Itching  . Percocet [Oxycodone-Acetaminophen] Other (See Comments)    Hallucinations     Patient Measurements: Height: 5' (152.4 cm) Weight: 171 lb (77.565 kg) IBW/kg (Calculated) : 45.5 Heparin Dosing Weight: na  Vital Signs: Temp: 98.1 F (36.7 C) (07/07 0624) Temp Source: Oral (07/07 0624) BP: 156/55 mmHg (07/07 0624) Pulse Rate: 61 (07/07 0624)  Labs:  Recent Labs  02/07/16 1136 02/07/16 1308 02/08/16 0328 02/09/16 0444  HGB 6.8*  --  7.1* 9.0*  HCT 20.5*  --  21.1*  --   PLT 439  --  360  --   LABPROT  --  >90.0* 47.7* 46.6*  INR  --  >10.00* 5.41* 5.25*  CREATININE 2.63*  --  1.89* 1.33*  TROPONINI <0.03  --   --   --     Estimated Creatinine Clearance: 38.3 mL/min (by C-G formula based on Cr of 1.33).   Medical History: Past Medical History  Diagnosis Date  . Diabetes mellitus without complication ([place])   . Hypertension   . Pacemaker     Medications:  Prescriptions prior to admission  Medication Sig Dispense Refill Last Dose  . amLODipine (NORVASC) 5 MG tablet Take 5 mg by mouth daily.   unknown at unknown  . furosemide (LASIX) 20 MG tablet Take 20 mg by mouth daily.   unknown at unknown  . gabapentin (NEURONTIN) 300 MG capsule Take 300 mg by mouth 2 (two) times daily.   unknown at unknown  . levothyroxine (SYNTHROID, LEVOTHROID) 50 MCG tablet Take 50 mcg by mouth daily before breakfast.   unknown at unknown  . metoprolol succinate (TOPROL-XL) 100 MG 24 hr tablet Take 100 mg by mouth daily. Take with or immediately following a meal.   unknown at unknown  . omeprazole (PRILOSEC) 20 MG capsule Take 20 mg by mouth 2 (two) times daily before a meal.   unknown at unknown  . SitaGLIPtin-MetFORMIN HCl (JANUMET XR) [phone] MG TB24 Take 1 tablet by mouth daily.   unknown at  unknown  . temazepam (RESTORIL) 30 MG capsule Take 30 mg by mouth at bedtime as needed for sleep.   unknown at unknown  . traMADol (ULTRAM) 50 MG tablet Take 50 mg by mouth 2 (two) times daily.   unknown at unknown  . warfarin (COUMADIN) 5 MG tablet Take 5 mg by mouth daily.   unknown at unknown    Assessment: Patient admitted for symptomatic anemia and supratherapeutic INR of > 10.  On warfarin 5 mg daily at home for CVA.   7/5  INR  >10.0  Hold dose  (Vit K 5mg  PO ordered 7/5 but not charted as given). 7/6  INR 5.41     Hold dose  7/7  INR 5.25     Hold dose  Goal of Therapy:  INR 2-3 Monitor platelets by anticoagulation protocol: Yes   Plan:  INR still supratherapeutic at 5.25. Continue to hold coumadin.  Will monitor daily INR and restart Coumadin when INR is within goal range.  Patient on amiodarone(drug interaction with Coumadin).  Chinita Greenland PharmD Clinical Pharmacist 02/09/2016  8:00 AM

## 2016-02-09 NOTE — Progress Notes (Signed)
PT Cancellation Note  Patient Details Name: Renee Boyd MRN: [id number] DOB: [birth year]   Cancelled Treatment:    Reason Eval/Treat Not Completed: Medical issues which prohibited therapy. Pt's chart reviewed. She was held yesterday due to low hemoglobin, critically high INR and elevated BP. She also had a rapid response called yesterday for SOB. Today pt's INR remains critically high. Per PT protocol pt is inappropriate for physical therapy at this time. PT will f/u at a later time and complete evaluation when appropriate.   Neoma Laming, PT, DPT  02/09/2016, 10:52 AM [phone]

## 2016-02-09 NOTE — Care Management (Signed)
Patient admitted with Acute hypoxic respiratory failure. Patient requiring acute O2.  RN to wean as appropriate. PT consult pending.  Unable to assess due to critical INR.

## 2016-02-09 NOTE — Progress Notes (Signed)
[facility] at [place] NAME: Renee Boyd    MR#:  [id number]  DATE OF BIRTH:  Dec 13, 1950  SUBJECTIVE:  CHIEF COMPLAINT:   Chief Complaint  Patient presents with  . Weakness   Admitted for weakness and found to have anemia due to diffuse bruising from coumadin. Stool negative for blood.  Shortness of breath better today.  Had acute stroke in May 2017 and was started on Coumadin by her primary care physician. No history of atrial fibrillation.  REVIEW OF SYSTEMS:    Review of Systems  Constitutional: Positive for malaise/fatigue.  Respiratory: Positive for cough, shortness of breath and wheezing.   Neurological: Positive for weakness.  Psychiatric/Behavioral: The patient is nervous/anxious.     DRUG ALLERGIES:   Allergies  Allergen Reactions  . Morphine And Related Itching  . Percocet [Oxycodone-Acetaminophen] Other (See Comments)    Hallucinations     VITALS:  Blood pressure 156/55, pulse 61, temperature 98.1 F (36.7 C), temperature source Oral, resp. rate 18, height 5' (1.524 m), weight 77.565 kg (171 lb), SpO2 98 %.  PHYSICAL EXAMINATION:   Physical Exam  GENERAL:  65 y.o.-year-old patient lying in the bed  EYES: Pupils equal, round, reactive to light and accommodation. No scleral icterus. Extraocular muscles intact.  HEENT: Head atraumatic, normocephalic. Oropharynx and nasopharynx clear.  NECK:  Supple, no jugular venous distention. No thyroid enlargement, no tenderness.  LUNGS: Bilateral basal crackles. CARDIOVASCULAR: S1, S2 normal. No murmurs, rubs, or gallops.  ABDOMEN: Soft, nontender, nondistended.  No organomegaly or mass.  EXTREMITIES: No cyanosis, clubbing or edema b/l.    NEUROLOGIC: Moves all 4 extremities. PSYCHIATRIC: The patient is alert and awake. Anxious SKIN: No obvious rash, lesion, or ulcer.  Bruising on arms  LABORATORY PANEL:   CBC  Recent Labs Lab 02/08/16 0328 02/09/16 0444   WBC 8.7  --   HGB 7.1* 9.0*  HCT 21.1*  --   PLT 360  --    ------------------------------------------------------------------------------------------------------------------ Chemistries   Recent Labs Lab 02/07/16 1136  02/09/16 0444  NA 136  < > 141  K 4.6  < > 3.8  CL 106  < > 109  CO2 19*  < > 24  GLUCOSE 129*  < > 171*  BUN 60*  < > 33*  CREATININE 2.63*  < > 1.33*  CALCIUM 9.2  < > 9.2  MG 1.8  --   --   AST 20  --   --   ALT 10*  --   --   ALKPHOS 60  --   --   BILITOT 0.7  --   --   < > = values in this interval not displayed. ------------------------------------------------------------------------------------------------------------------  Cardiac Enzymes  Recent Labs Lab 02/07/16 1136  TROPONINI <0.03   ------------------------------------------------------------------------------------------------------------------  RADIOLOGY:     ASSESSMENT AND PLAN:   65 year old African-American female history of recent CVA placed on warfarin presenting with fatigue  * Acute hypoxic respiratory failure likely from pulm edema due to blood transfusion Improving with Lasix. Continue IV Lasix one more day. Monitor input and output along with BUN and creatinine.  * Accelerated HTN Resumed home meds. Improving  * Symptomatic anemia due to hematoma and brusing in arms due to supratherapeutic INR Stool negative for Hemoccult Transfused 2 units PRBC. Hemoglobin improved  * h/o CVA On Coumadin at home. No history of atrial fibrillation and recurrent strokes. Statin. Will need to be discharged on aspirin with cardiology follow-up.  *  CKD 3 Stable  * Hypothyroidism unspecified Synthroid  * GERD without esophagitis PPI therapy  * Type 2 diabetes non-insulin-requiring hold oral agents at insulin sliding scale  * Hypertension essential: Norvasc, cloinidine  All the records are reviewed and case discussed with Care Management/Social Workerr. Management plans  discussed with the patient, family and they are in agreement.  CODE STATUS: FULL CODE  DVT Prophylaxis: SCDs  TOTAL CRITICAL CARE TIME TAKING CARE OF THIS PATIENT: 35 minutes.   Likely discharge tomorrow.  Hillary Bow R M.D on 02/09/2016 at 11:48 AM  Between 7am to 6pm - Pager - [phone]  After 6pm go to [email or web address] - password [email or web address] [facility]  Office  [phone]  CC: Primary care physician; Cletis Athens, MD  Note: This dictation was prepared with Dragon dictation along with smaller phrase technology. Any transcriptional errors that result from this process are unintentional.

## 2016-02-10 LAB — BASIC METABOLIC PANEL
ANION GAP: 9 (ref 5–15)
BUN: 32 mg/dL — AB (ref 6–20)
CALCIUM: 9.3 mg/dL (ref 8.9–10.3)
CO2: 27 mmol/L (ref 22–32)
Chloride: 104 mmol/L (ref 101–111)
Creatinine, Ser: 1.46 mg/dL — ABNORMAL HIGH (ref 0.44–1.00)
GFR calc Af Amer: 42 mL/min — ABNORMAL LOW (ref >60)
GFR, EST NON AFRICAN AMERICAN: 36 mL/min — AB (ref >60)
GLUCOSE: 163 mg/dL — AB (ref 65–99)
POTASSIUM: 3.7 mmol/L (ref 3.5–5.1)
SODIUM: 140 mmol/L (ref 135–145)

## 2016-02-10 LAB — HEMOGLOBIN: HEMOGLOBIN: 8.8 g/dL — AB (ref 12.0–16.0)

## 2016-02-10 LAB — GLUCOSE, CAPILLARY
GLUCOSE-CAPILLARY: 151 mg/dL — AB (ref 65–99)
GLUCOSE-CAPILLARY: 259 mg/dL — AB (ref 65–99)
GLUCOSE-CAPILLARY: 145 mg/dL — AB (ref 65–99)
Glucose-Capillary: 136 mg/dL — ABNORMAL HIGH (ref 65–99)

## 2016-02-10 LAB — MAGNESIUM: Magnesium: 1.7 mg/dL (ref 1.7–2.4)

## 2016-02-10 LAB — PROTIME-INR
INR: 2.19
Prothrombin Time: 24.2 seconds — ABNORMAL HIGH (ref 11.4–15.0)

## 2016-02-10 MED ORDER — PNEUMOCOCCAL VAC POLYVALENT 25 MCG/0.5ML IJ INJ: 0.5000 mL | INJECTION | INTRAMUSCULAR | Status: DC

## 2016-02-10 MED ORDER — FUROSEMIDE 20 MG PO TABS: 40.0000 mg | ORAL_TABLET | Freq: Every day | ORAL | Status: DC

## 2016-02-10 MED ORDER — FLEET ENEMA 7-19 GM/118ML RE ENEM: 1.0000 | ENEMA | Freq: Once | RECTAL | Status: DC | PRN

## 2016-02-10 MED ORDER — ASPIRIN EC 81 MG PO TBEC: 81.0000 mg | DELAYED_RELEASE_TABLET | Freq: Every day | ORAL | Status: DC

## 2016-02-10 NOTE — Progress Notes (Signed)
[facility] at [place] NAME: Renee Boyd    MR#:  [id number]  DATE OF BIRTH:  05/28/1951  SUBJECTIVE:  CHIEF COMPLAINT:   Chief Complaint  Patient presents with  . Weakness   Admitted for weakness and found to have anemia due to diffuse bruising from coumadin. Stool negative for blood.  Shortness of breath better. No cough. Up and ambulating to bathroom. Episodes of dozing off per RN  Had acute stroke in May 2017 and was started on Coumadin by her primary care physician. No history of atrial fibrillation.  REVIEW OF SYSTEMS:    Review of Systems  Constitutional: Positive for malaise/fatigue.  Respiratory: Positive for cough, shortness of breath and wheezing.   Neurological: Positive for weakness.  Psychiatric/Behavioral: The patient is nervous/anxious.     DRUG ALLERGIES:   Allergies  Allergen Reactions  . Morphine And Related Itching  . Percocet [Oxycodone-Acetaminophen] Other (See Comments)    Hallucinations     VITALS:  Blood pressure 127/53, pulse 60, temperature 98.3 F (36.8 C), temperature source Oral, resp. rate 18, height 5' (1.524 m), weight 77.565 kg (171 lb), SpO2 96 %.  PHYSICAL EXAMINATION:   Physical Exam  GENERAL:  65 y.o.-year-old patient lying in the bed  EYES: Pupils equal, round, reactive to light and accommodation. No scleral icterus. Extraocular muscles intact.  HEENT: Head atraumatic, normocephalic. Oropharynx and nasopharynx clear.  NECK:  Supple, no jugular venous distention. No thyroid enlargement, no tenderness.  LUNGS: CTA b/L CARDIOVASCULAR: S1, S2 normal. No murmurs, rubs, or gallops.  ABDOMEN: Soft, nontender, nondistended.  No organomegaly or mass.  EXTREMITIES: No cyanosis, clubbing or edema b/l.    NEUROLOGIC: Moves all 4 extremities. PSYCHIATRIC: The patient is alert and awake. Anxious SKIN: No obvious rash, lesion, or ulcer.  Bruising on arms  LABORATORY PANEL:    CBC  Recent Labs Lab 02/08/16 0328  02/10/16 0348  WBC 8.7  --   --   HGB 7.1*  < > 8.8*  HCT 21.1*  --   --   PLT 360  --   --   < > = values in this interval not displayed. ------------------------------------------------------------------------------------------------------------------ Chemistries   Recent Labs Lab 02/07/16 1136  02/10/16 0348  NA 136  < > 140  K 4.6  < > 3.7  CL 106  < > 104  CO2 19*  < > 27  GLUCOSE 129*  < > 163*  BUN 60*  < > 32*  CREATININE 2.63*  < > 1.46*  CALCIUM 9.2  < > 9.3  MG 1.8  --  1.7  AST 20  --   --   ALT 10*  --   --   ALKPHOS 60  --   --   BILITOT 0.7  --   --   < > = values in this interval not displayed. ------------------------------------------------------------------------------------------------------------------  Cardiac Enzymes  Recent Labs Lab 02/07/16 1136  TROPONINI <0.03   ------------------------------------------------------------------------------------------------------------------  RADIOLOGY:     ASSESSMENT AND PLAN:   65 year old African-American female history of recent CVA placed on warfarin presenting with fatigue  * Acute hypoxic respiratory failure likely from pulm edema due to blood transfusion Improving with Lasix. Continue IV Lasix.  Likely d/c in AM  * Accelerated HTN Resumed home meds. Improving  * Symptomatic anemia due to hematoma and brusing in arms due to supratherapeutic INR Stool negative for Hemoccult Transfused 2 units PRBC. Hemoglobin improved and stable  * h/o  CVA On Coumadin at home. No history of atrial fibrillation and recurrent strokes. Statin. Will need to be discharged on aspirin with cardiology follow-up.  * CKD 3 Stable  * Hypothyroidism unspecified Synthroid  * GERD without esophagitis PPI therapy  * Type 2 diabetes non-insulin-requiring hold oral agents at insulin sliding scale  * Hypertension essential: Norvasc, clonidine, Lopressor  All the  records are reviewed and case discussed with Care Management/Social Workerr. Management plans discussed with the patient, family and they are in agreement.  CODE STATUS: FULL CODE  DVT Prophylaxis: SCDs  TOTAL CRITICAL CARE TIME TAKING CARE OF THIS PATIENT: 35 minutes.   Discharge after PT sees patient tomorrow  Hillary Bow R M.D on 02/10/2016 at 4:59 PM  Between 7am to 6pm - Pager - [phone]  After 6pm go to [email or web address] - password [email or web address] [facility]  Office  [phone]  CC: Primary care physician; Cletis Athens, MD  Note: This dictation was prepared with Dragon dictation along with smaller phrase technology. Any transcriptional errors that result from this process are unintentional.

## 2016-02-10 NOTE — Progress Notes (Signed)
PT Cancellation Note  Patient Details Name: Renee Boyd MRN: [id number] DOB: November 28, 1950   Cancelled Treatment:    Reason Eval/Treat Not Completed: Medical issues which prohibited therapy.  INR still critical and will check tomorrow to see if pt more stable.   Ramond Dial 02/10/2016, 7:55 AM    Mee Hives, PT MS Acute Rehab Dept. Number: [place] [facility] [place]

## 2016-02-11 LAB — GLUCOSE, CAPILLARY
GLUCOSE-CAPILLARY: 149 mg/dL — AB (ref 65–99)
GLUCOSE-CAPILLARY: 200 mg/dL — AB (ref 65–99)

## 2016-02-11 MED ORDER — FUROSEMIDE 40 MG PO TABS: 40.0000 mg | ORAL_TABLET | Freq: Every day | ORAL | Status: DC

## 2016-02-11 MED ORDER — FUROSEMIDE 20 MG PO TABS: 40.0000 mg | ORAL_TABLET | Freq: Every day | ORAL | Status: DC

## 2016-02-11 MED ORDER — ATORVASTATIN CALCIUM 20 MG PO TABS: 20.0000 mg | ORAL_TABLET | Freq: Every day | ORAL | Status: DC

## 2016-02-11 NOTE — Discharge Instructions (Signed)
Take medications as prescribed.  Please contact your doctor if you should experience increased weakness, lightheadedness or dizziness, nausea, vomiting, fever and if you should have any questions or concerns.

## 2016-02-11 NOTE — Evaluation (Signed)
Physical Therapy Evaluation Patient Details Name: Renee Boyd MRN: [id number] DOB: 01-08-1951 Today's Date: 02/11/2016   History of Present Illness  66 yo female with onset of L ankle instability was admitted for anemia and weakness.  Supratherapeutic INR now controlled.  PMHx:  Stroke May 2017, CKD 3, GERD, hypothyroidism, DM, HTN,   Clinical Impression  Pt is up to walk with clear pain and dysfunction issues of L ankle and need to steady herself.  Recommended HHPT and SPC which pt finally agreed to get.  Her follow up is to use [facility] for acute visits as still in hospital and will continue therapy at home.  Pt is there alone and has already described falls prior to her admission.    Follow Up Recommendations Home health PT    Equipment Recommendations  Cane    Recommendations for Other Services Rehab consult     Precautions / Restrictions Precautions Precautions: Fall (telemetry) Required Braces or Orthoses: Other Brace/Splint (L ankle brace for lateral support given by ortho ) Other Brace/Splint: orthopedist prescribed ankle support Restrictions Weight Bearing Restrictions: No      Mobility  Bed Mobility Overal bed mobility: Modified Independent                Transfers Overall transfer level: Modified independent Equipment used: 1 person hand held assist             General transfer comment: pt is able to stand but initially painful on L ankle  Ambulation/Gait Ambulation/Gait assistance: Min guard;Min assist Ambulation Distance (Feet): 325 Feet Assistive device: 1 person hand held assist Gait Pattern/deviations: Step-through pattern;Step-to pattern;Wide base of support;Antalgic;Shuffle Gait velocity: reduced Gait velocity interpretation: Below normal speed for age/gender General Gait Details: has difficulty with inital standing on L ankle then lists to R a few times, no complete LOB but has some mm symptoms on R thigh  Stairs            Wheelchair  Mobility    Modified Rankin (Stroke Patients Only)       Balance Overall balance assessment: Needs assistance;History of Falls Sitting-balance support: Feet supported Sitting balance-Leahy Scale: Good   Postural control: Right lateral lean Standing balance support: Single extremity supported Standing balance-Leahy Scale: Fair                               Pertinent Vitals/Pain Pain Assessment: Faces Faces Pain Scale: Hurts even more Pain Location: L ankle with initial standing Pain Intervention(s): Monitored during session;Repositioned  Pregait O2 sat 91% and post gait 99%, pre pulse 71 and post gait pulse 84.    Home Living Family/patient expects to be discharged to:: Private residence Living Arrangements: Alone Available Help at Discharge: Family;Available PRN/intermittently Type of Home: Apartment Home Access: Level entry     Home Layout: One level Home Equipment: None      Prior Function Level of Independence: Independent         Comments: has been painful to use L ankle with no AD     Hand Dominance   Dominant Hand: Right    Extremity/Trunk Assessment   Upper Extremity Assessment: Overall WFL for tasks assessed           Lower Extremity Assessment: LLE deficits/detail   LLE Deficits / Details: L ankle has minor edema and 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  HHPT also agreed upon to continue strengthenign and balance skills    Exercises        Assessment/Plan    PT Assessment  Patient needs continued PT services  PT Diagnosis Difficulty walking   PT Problem List Decreased strength;Decreased range of motion;Decreased activity tolerance;Decreased balance;Decreased mobility;Decreased coordination;Decreased knowledge of use of DME;Decreased safety awareness;Cardiopulmonary status limiting activity;Pain  PT Treatment Interventions DME instruction;Gait training;Functional mobility training;Therapeutic activities;Therapeutic exercise;Balance training;Neuromuscular re-education;Patient/family education   PT Goals (Current goals can be found in the Care Plan section) Acute Rehab PT Goals Patient Stated Goal: home alone PT Goal Formulation: With patient Time For Goal Achievement: 02/25/16 Potential to Achieve Goals: Good    Frequency Min 2X/week   Barriers to discharge Decreased caregiver support home alone     Co-evaluation               End of Session Equipment Utilized During Treatment: Gait belt;Other (comment) (L ankle brace) Activity Tolerance: Patient tolerated treatment well;Patient limited by fatigue Patient left: in chair;with call bell/phone within reach;with chair alarm set Nurse Communication: Mobility status         Time: [id number] PT Time Calculation (min) (ACUTE ONLY): 35 min   Charges:   PT Evaluation $PT Eval Low Complexity: 1 Procedure PT Treatments $Gait Training: 8-22 mins   PT G CodesRamond Dial 20-Feb-2016, 10:29 AM    Mee Hives, PT MS Acute Rehab Dept. Number: [place] [facility] [place]

## 2016-02-11 NOTE — Care Management Note (Addendum)
Case Management Note  Patient Details  Name: Renee Boyd MRN: [id number] Date of Birth: 1951/03/30  Subjective/Objective:    Discussed discharge planning with Mrs Rister and reviewed list of local home health providers. Mrs Current decided to use [place] as her PT provider. A referral for HH-PT was faxed to [place]. Canes are a non-covered item and Mrs Steinberger can purchase a cane at any pharmacy or [place].                 Action/Plan:   Expected Discharge Date:                  Expected Discharge Plan:     In-House Referral:     Discharge planning Services     Post Acute Care Choice:    Choice offered to:     DME Arranged:    DME Agency:     HH Arranged:    HH Agency:     Status of Service:     If discussed at [employer] of Stay Meetings, dates discussed:    Additional Comments:  Cobi Aldape A, RN 02/11/2016, 10:16 AM

## 2016-02-12 DIAGNOSIS — R531 Weakness: Secondary | ICD-10-CM | POA: Diagnosis not present

## 2016-02-12 DIAGNOSIS — I129 Hypertensive chronic kidney disease with stage 1 through stage 4 chronic kidney disease, or unspecified chronic kidney disease: Secondary | ICD-10-CM | POA: Diagnosis not present

## 2016-02-12 DIAGNOSIS — Z87891 Personal history of nicotine dependence: Secondary | ICD-10-CM | POA: Diagnosis not present

## 2016-02-12 DIAGNOSIS — Z9181 History of falling: Secondary | ICD-10-CM | POA: Diagnosis not present

## 2016-02-12 DIAGNOSIS — Z950 Presence of cardiac pacemaker: Secondary | ICD-10-CM | POA: Diagnosis not present

## 2016-02-12 DIAGNOSIS — Z742 Need for assistance at home and no other household member able to render care: Secondary | ICD-10-CM | POA: Diagnosis not present

## 2016-02-12 DIAGNOSIS — N183 Chronic kidney disease, stage 3 (moderate): Secondary | ICD-10-CM | POA: Diagnosis not present

## 2016-02-12 DIAGNOSIS — E1122 Type 2 diabetes mellitus with diabetic chronic kidney disease: Secondary | ICD-10-CM | POA: Diagnosis not present

## 2016-02-12 DIAGNOSIS — Z8673 Personal history of transient ischemic attack (TIA), and cerebral infarction without residual deficits: Secondary | ICD-10-CM | POA: Diagnosis not present

## 2016-02-12 DIAGNOSIS — D649 Anemia, unspecified: Secondary | ICD-10-CM | POA: Diagnosis not present

## 2016-02-14 DIAGNOSIS — E1122 Type 2 diabetes mellitus with diabetic chronic kidney disease: Secondary | ICD-10-CM | POA: Diagnosis not present

## 2016-02-14 DIAGNOSIS — D649 Anemia, unspecified: Secondary | ICD-10-CM | POA: Diagnosis not present

## 2016-02-14 DIAGNOSIS — R531 Weakness: Secondary | ICD-10-CM | POA: Diagnosis not present

## 2016-02-14 DIAGNOSIS — I129 Hypertensive chronic kidney disease with stage 1 through stage 4 chronic kidney disease, or unspecified chronic kidney disease: Secondary | ICD-10-CM | POA: Diagnosis not present

## 2016-02-14 DIAGNOSIS — N183 Chronic kidney disease, stage 3 (moderate): Secondary | ICD-10-CM | POA: Diagnosis not present

## 2016-02-14 DIAGNOSIS — Z9181 History of falling: Secondary | ICD-10-CM | POA: Diagnosis not present

## 2016-02-15 DIAGNOSIS — I129 Hypertensive chronic kidney disease with stage 1 through stage 4 chronic kidney disease, or unspecified chronic kidney disease: Secondary | ICD-10-CM | POA: Diagnosis not present

## 2016-02-15 DIAGNOSIS — E1122 Type 2 diabetes mellitus with diabetic chronic kidney disease: Secondary | ICD-10-CM | POA: Diagnosis not present

## 2016-02-15 DIAGNOSIS — R531 Weakness: Secondary | ICD-10-CM | POA: Diagnosis not present

## 2016-02-15 DIAGNOSIS — N183 Chronic kidney disease, stage 3 (moderate): Secondary | ICD-10-CM | POA: Diagnosis not present

## 2016-02-15 DIAGNOSIS — Z9181 History of falling: Secondary | ICD-10-CM | POA: Diagnosis not present

## 2016-02-15 DIAGNOSIS — D649 Anemia, unspecified: Secondary | ICD-10-CM | POA: Diagnosis not present

## 2016-02-15 NOTE — Discharge Summary (Signed)
[facility] NAME: Mckinzey Sturch    MR#:  [id number]  DATE OF BIRTH:  08-01-1951  DATE OF ADMISSION:  02/07/2016 ADMITTING PHYSICIAN: Lytle Butte, MD  DATE OF DISCHARGE: 02/11/2016  2:40 PM  PRIMARY CARE PHYSICIAN: MASOUD,JAVED, MD   ADMISSION DIAGNOSIS:  Arm pain [M79.603] Ecchymosis [R58] Symptomatic anemia [D64.9] Acute renal failure, unspecified acute renal failure type ([place]) [N17.9]  DISCHARGE DIAGNOSIS:  Active Problems:   Symptomatic anemia   Supratherapeutic INR   SECONDARY DIAGNOSIS:   Past Medical History  Diagnosis Date  . Diabetes mellitus without complication ([place])   . Hypertension   . Pacemaker      ADMITTING HISTORY  Kentoria Perry is a 65 y.o. female with a known history of Recent CVA 12/18/2015 started on warfarin at that time who is presenting with progressive weakness and fatigue. States she has been getting progressively weak and tired with increased falls. Follows she has noticed large amounts of bruising bilateral arms and back side without any frank bleeding. She states that she does have some dyspnea on exertion but also gets fatigued and "dizzy" with minimal work. With the above symptoms decided to present to Hospital further workup and evaluation found to be anemic with supratherapeutic INR-fecal occult blood test negative  HOSPITAL COURSE:   65 year old African-American female history of recent CVA placed on warfarin presenting with fatigue  * Acute hypoxic respiratory failure likely from pulm edema from blood transfusion Resolved  with Lasix. Continue PO lasix after discharge.  * Accelerated HTN Resumed home meds. Improved.  * Symptomatic anemia due to hematoma and brusing in arms due to supratherapeutic INR Stool negative for Hemoccult Transfused 2 units PRBC. Hemoglobin improved and stable  * h/o CVA On Coumadin at home. No history of atrial fibrillation or recurrent  strokes. Statin. Coumadin held due to hematomas and anemia. Started baby aspirin Cardiology follow up for further anti coagulation decisions.  * CKD 3 Stable  * Hypothyroidism unspecified - Synthroid  * GERD without esophagitis PPI therapy  * Type 2 diabetes non-insulin-requiring hold oral agents at insulin sliding scale  * Hypertension essential: Norvasc, clonidine, Lopressor  Stable for discharge home  CONSULTS OBTAINED:  Treatment Team:  Lytle Butte, MD  DRUG ALLERGIES:   Allergies  Allergen Reactions  . Morphine And Related Itching  . Percocet [Oxycodone-Acetaminophen] Other (See Comments)    Hallucinations     DISCHARGE MEDICATIONS:   Discharge Medication List as of 02/11/2016 10:30 AM    START taking these medications   Details  aspirin EC 81 MG tablet Take 1 tablet (81 mg total) by mouth daily., Starting 02/10/2016, Until Discontinued, OTC    atorvastatin (LIPITOR) 20 MG tablet Take 1 tablet (20 mg total) by mouth daily., Starting 02/11/2016, Until Discontinued, Normal      CONTINUE these medications which have CHANGED   Details  furosemide (LASIX) 20 MG tablet Take 2 tablets (40 mg total) by mouth daily., Starting 02/10/2016, Until Discontinued, Normal      CONTINUE these medications which have NOT CHANGED   Details  amLODipine (NORVASC) 5 MG tablet Take 5 mg by mouth daily., Until Discontinued, Historical Med    gabapentin (NEURONTIN) 300 MG capsule Take 300 mg by mouth 2 (two) times daily., Until Discontinued, Historical Med    levothyroxine (SYNTHROID, LEVOTHROID) 50 MCG tablet Take 50 mcg by mouth daily before breakfast., Until Discontinued, Historical Med    metoprolol succinate (TOPROL-XL) 100 MG  24 hr tablet Take 100 mg by mouth daily. Take with or immediately following a meal., Until Discontinued, Historical Med    omeprazole (PRILOSEC) 20 MG capsule Take 20 mg by mouth 2 (two) times daily before a meal., Until Discontinued, Historical Med     SitaGLIPtin-MetFORMIN HCl (JANUMET XR) [phone] MG TB24 Take 1 tablet by mouth daily., Until Discontinued, Historical Med    temazepam (RESTORIL) 30 MG capsule Take 30 mg by mouth at bedtime as needed for sleep., Until Discontinued, Historical Med    traMADol (ULTRAM) 50 MG tablet Take 50 mg by mouth 2 (two) times daily., Until Discontinued, Historical Med      STOP taking these medications     warfarin (COUMADIN) 5 MG tablet      amiodarone (PACERONE) 200 MG tablet      cloNIDine (CATAPRES) 0.2 MG tablet      lansoprazole (PREVACID) 30 MG capsule      lisinopril (PRINIVIL,ZESTRIL) 40 MG tablet      NIFEdipine (PROCARDIA-XL/ADALAT CC) 60 MG 24 hr tablet      sucralfate (CARAFATE) 1 G tablet         Today   VITAL SIGNS:  Blood pressure 121/55, pulse 60, temperature 97.6 F (36.4 C), temperature source Oral, resp. rate 20, height 5' (1.524 m), weight 77.565 kg (171 lb), SpO2 92 %.  I/O:  No intake or output data in the 24 hours ending 02/15/16 1924  PHYSICAL EXAMINATION:  Physical Exam  GENERAL:  65 y.o.-year-old patient lying in the bed with no acute distress.  LUNGS: Normal breath sounds bilaterally, no wheezing, rales,rhonchi or crepitation. No use of accessory muscles of respiration. CARDIOVASCULAR: S1, S2 normal. No murmurs, rubs, or gallops.  NEUROLOGIC: Moves all 4 extremities. PSYCHIATRIC: The patient is alert and oriented x 3.  SKIN: Bruising  DATA REVIEW:   CBC  Recent Labs Lab 02/10/16 0348  HGB 8.8*    Chemistries   Recent Labs Lab 02/10/16 0348  NA 140  K 3.7  CL 104  CO2 27  GLUCOSE 163*  BUN 32*  CREATININE 1.46*  CALCIUM 9.3  MG 1.7    Cardiac Enzymes No results for input(s): TROPONINI in the last 168 hours.  Microbiology Results  No results found for this or any previous visit.  RADIOLOGY:  No results found.  Follow up with PCP in 1 week.  Management plans discussed with the patient, family and they are in  agreement.  CODE STATUS:  Code Status History    Date Active Date Inactive Code Status Order ID Comments User Context   02/07/2016  2:20 PM 02/11/2016  5:40 PM Full Code [id number]  Lytle Butte, MD ED      TOTAL TIME TAKING CARE OF THIS PATIENT ON DAY OF DISCHARGE: more than 30 minutes.   Hillary Bow R M.D on 02/15/2016 at 7:24 PM  Between 7am to 6pm - Pager - [phone]  After 6pm go to [email or web address] - password [email or web address] [facility]  Office  [phone]  CC: Primary care physician; Cletis Athens, MD  Note: This dictation was prepared with Dragon dictation along with smaller phrase technology. Any transcriptional errors that result from this process are unintentional.

## 2016-02-19 DIAGNOSIS — I129 Hypertensive chronic kidney disease with stage 1 through stage 4 chronic kidney disease, or unspecified chronic kidney disease: Secondary | ICD-10-CM | POA: Diagnosis not present

## 2016-02-19 DIAGNOSIS — N179 Acute kidney failure, unspecified: Secondary | ICD-10-CM | POA: Diagnosis not present

## 2016-02-19 DIAGNOSIS — R531 Weakness: Secondary | ICD-10-CM | POA: Diagnosis not present

## 2016-02-19 DIAGNOSIS — Z9181 History of falling: Secondary | ICD-10-CM | POA: Diagnosis not present

## 2016-02-19 DIAGNOSIS — N2581 Secondary hyperparathyroidism of renal origin: Secondary | ICD-10-CM | POA: Diagnosis not present

## 2016-02-19 DIAGNOSIS — N183 Chronic kidney disease, stage 3 (moderate): Secondary | ICD-10-CM | POA: Diagnosis not present

## 2016-02-19 DIAGNOSIS — D649 Anemia, unspecified: Secondary | ICD-10-CM | POA: Diagnosis not present

## 2016-02-19 DIAGNOSIS — E1122 Type 2 diabetes mellitus with diabetic chronic kidney disease: Secondary | ICD-10-CM | POA: Diagnosis not present

## 2016-02-20 ENCOUNTER — Encounter: Payer: Self-pay | Admitting: Internal Medicine

## 2016-02-20 ENCOUNTER — Ambulatory Visit (INDEPENDENT_AMBULATORY_CARE_PROVIDER_SITE_OTHER): Payer: Medicare Other | Admitting: Internal Medicine

## 2016-02-20 VITALS — BP 180/82 | HR 71 | Ht 60.0 in | Wt 158.8 lb

## 2016-02-20 DIAGNOSIS — I4891 Unspecified atrial fibrillation: Secondary | ICD-10-CM | POA: Diagnosis not present

## 2016-02-20 DIAGNOSIS — R001 Bradycardia, unspecified: Secondary | ICD-10-CM

## 2016-02-20 LAB — CUP PACEART INCLINIC DEVICE CHECK
Battery Voltage: 3 V
Brady Statistic AP VS Percent: 0 %
Brady Statistic AS VP Percent: 99.8 %
Brady Statistic AS VS Percent: 0.17 %
Implantable Lead Implant Date: 20141217
Implantable Lead Implant Date: 20141217
Implantable Lead Location: 753859
Lead Channel Impedance Value: 437 Ohm
Lead Channel Impedance Value: 589 Ohm
Lead Channel Pacing Threshold Amplitude: 0.75 V
Lead Channel Pacing Threshold Pulse Width: 0.4 ms
Lead Channel Setting Pacing Amplitude: 2 V
Lead Channel Setting Pacing Amplitude: 2.5 V
Lead Channel Setting Pacing Pulse Width: 0.4 ms
MDC IDC LEAD LOCATION: 753860
MDC IDC MSMT BATTERY REMAINING LONGEVITY: 72 mo
MDC IDC MSMT LEADCHNL RA IMPEDANCE VALUE: 399 Ohm
MDC IDC MSMT LEADCHNL RA SENSING INTR AMPL: 4.125 mV
MDC IDC MSMT LEADCHNL RV IMPEDANCE VALUE: 646 Ohm
MDC IDC MSMT LEADCHNL RV SENSING INTR AMPL: 15.625 mV
MDC IDC SESS DTM: 20170718133120
MDC IDC SET LEADCHNL RV SENSING SENSITIVITY: 0.9 mV
MDC IDC STAT BRADY AP VP PERCENT: 0.03 %
MDC IDC STAT BRADY RA PERCENT PACED: 0.03 %
MDC IDC STAT BRADY RV PERCENT PACED: 99.83 %

## 2016-02-20 MED ORDER — AMLODIPINE BESYLATE 10 MG PO TABS: 10.0000 mg | ORAL_TABLET | Freq: Every day | ORAL | Status: DC

## 2016-02-20 MED ORDER — APIXABAN 5 MG PO TABS: 5.0000 mg | ORAL_TABLET | Freq: Two times a day (BID) | ORAL | Status: DC

## 2016-02-20 NOTE — Patient Instructions (Addendum)
Medication Instructions: - Your physician has recommended you make the following change in your medication:  1) Stop aspirin 2) Increase norvasc (amlodipine) to 10 mg once daily 3) Start Eliquis 5 mg one tablet by mouth twice daily  Labwork: - none  Procedures/Testing: - none  Follow-Up: - Your physician recommends that you schedule a follow-up appointment in: 2 months with Dr. Caryl Comes.  Raquel Sarna (device tech) has ordered you a transmitter for home. Please call the [facility] at [phone] when you receive the transmitter.  Any Additional Special Instructions Will Be Listed Below (If Applicable).     If you need a refill on your cardiac medications before your next appointment, please call your pharmacy.

## 2016-02-20 NOTE — Progress Notes (Signed)
ELECTROPHYSIOLOGY CONSULT NOTE  Patient ID: Renee Boyd, MRN: [id number], DOB/AGE: April 17, 1951 65 y.o. Admit date: (Not on file) Date of Consult: 02/20/2016  Primary Physician: Cletis Athens, MD Primary Cardiologist: new  Consulting Physician new  Chief Complaint: establish   HPI Renee Boyd is a 65 y.o. female  Self-referred for cardiac care.  She has a history of a pacemaker implanted 2006 he came here. She had syncope. Device generator replacement undertaking 2014. She now has complete heart block.  She was noted on device interrogation to of had atrial fibrillation in March. Some time in late spring she developed a TIA involving her left hand. She was started on Coumadin. She suddenly developed swelling with discoloration of arms and legs. She had a syncopal episode associated with raising of her head and finally presented to the hospital where she was found to have a supratherapeutic INR left (greater than or equal to 10) and hemoglobin in the 6s. She received FFP and was transfused.  She had transient renal insufficiency discharge creatinine was 1.3. She was hypotensive in hospital and clonidine and lisinopril were discontinued. Amlodipine and metoprolol were started at discharge.  She is followed up with nephrology; those labs are not available.        Past Medical History  Diagnosis Date  . Diabetes mellitus without complication ([place])   . Hypertension   . Pacemaker       Surgical History:  Past Surgical History  Procedure Laterality Date  . Abdominal hysterectomy       Home Meds: Prior to Admission medications   Medication Sig Start Date End Date Taking? Authorizing Provider  amLODipine (NORVASC) 5 MG tablet Take 5 mg by mouth daily.   Yes Historical Provider, MD  aspirin EC 81 MG tablet Take 1 tablet (81 mg total) by mouth daily. 02/10/16  Yes Srikar Sudini, MD  atorvastatin (LIPITOR) 20 MG tablet Take 1 tablet (20 mg total) by mouth daily. 02/11/16  Yes  Srikar Sudini, MD  furosemide (LASIX) 20 MG tablet Take 2 tablets (40 mg total) by mouth daily. 02/11/16  Yes Srikar Sudini, MD  gabapentin (NEURONTIN) 300 MG capsule Take 300 mg by mouth 2 (two) times daily.   Yes Historical Provider, MD  levothyroxine (SYNTHROID, LEVOTHROID) 50 MCG tablet Take 50 mcg by mouth daily before breakfast.   Yes Historical Provider, MD  metoprolol succinate (TOPROL-XL) 100 MG 24 hr tablet Take 100 mg by mouth daily. Take with or immediately following a meal.   Yes Historical Provider, MD  omeprazole (PRILOSEC) 20 MG capsule Take 20 mg by mouth 2 (two) times daily before a meal.   Yes Historical Provider, MD  SitaGLIPtin-MetFORMIN HCl (JANUMET XR) [phone] MG TB24 Take 1 tablet by mouth daily.   Yes Historical Provider, MD  temazepam (RESTORIL) 30 MG capsule Take 30 mg by mouth at bedtime as needed for sleep.   Yes Historical Provider, MD  traMADol (ULTRAM) 50 MG tablet Take 50 mg by mouth 2 (two) times daily.   Yes Historical Provider, MD    Allergies:  Allergies  Allergen Reactions  . Morphine And Related Itching  . Percocet [Oxycodone-Acetaminophen] Other (See Comments)    Hallucinations     Social History   Social History  . Marital Status: Divorced    Spouse Name: N/A  . Number of Children: N/A  . Years of Education: N/A   Occupational History  . Not on file.   Social History Main Topics  .  Smoking status: Former Research scientist (life sciences)  . Smokeless tobacco: Never Used     Comment: quit Jun 27 2005  . Alcohol Use: No  . Drug Use: No  . Sexual Activity: Not on file   Other Topics Concern  . Not on file   Social History Narrative     Family History  Problem Relation Age of Onset  . Hypertension Other      ROS:  Please see the history of present illness.     All other systems reviewed and negative.    Physical Exam: Blood pressure 180/82, pulse 71, height 5' (1.524 m), weight 158 lb 12.8 oz (72.031 kg), SpO2 95 %. General: Well developed, well  nourished female in no acute distress. Head: Normocephalic, atraumatic, sclera non-icteric, no xanthomas, nares are without discharge. EENT: normal  Lymph Nodes:  none Neck: Negative for carotid bruits. JVD not elevated. Back:without scoliosis kyphosis Lungs: Clear bilaterally to auscultation without wheezes, rales, or rhonchi. Breathing is unlabored. Heart: RRR with S1 S2. No  murmur . No rubs, or gallops appreciated. Abdomen: Soft, non-tender, non-distended with normoactive bowel sounds. No hepatomegaly. No rebound/guarding. No obvious abdominal masses. Msk:  Strength and tone appear normal for age. Extremities: No clubbing or cyanosis. No  edema.  Distal pedal pulses are 2+ and equal bilaterally. Skin: Warm and Dry Neuro: Alert and oriented X 3. CN III-XII intact Grossly normal sensory and motor function . Psych:  Responds to questions appropriately with a normal affect.      Labs: Cardiac Enzymes No results for input(s): CKTOTAL, CKMB, TROPONINI in the last 72 hours. CBC Lab Results  Component Value Date   WBC 8.7 02/08/2016   HGB 8.8* 02/10/2016   HCT 21.1* 02/08/2016   MCV 87.1 02/08/2016   PLT 360 02/08/2016   PROTIME: No results for input(s): LABPROT, INR in the last 72 hours. Chemistry No results for input(s): NA, K, CL, CO2, BUN, CREATININE, CALCIUM, PROT, BILITOT, ALKPHOS, ALT, AST, GLUCOSE in the last 168 hours.  Invalid input(s): LABALBU Lipids No results found for: CHOL, HDL, LDLCALC, TRIG BNP No results found for: PROBNP Thyroid Function Tests: No results for input(s): TSH, T4TOTAL, T3FREE, THYROIDAB in the last 72 hours.  Invalid input(s): FREET3 Miscellaneous No results found for: DDIMER  Radiology/Studies:  EKG: Atrial fibrillation with ventricular pacing Assessment and Plan: \ Complete heart block   pacemaker-[employer]  Atrial fibrillation-persistent  Major bleeding supratherapeutic INR  TIA  Hypertension   The patient has persistent  atrial fibrillation but no significant associated symptoms. We will not attempt restoration of sinus rhythm. From her pacemaker there have been multiple episodes of atrial fibrillation hence, restoration of sinus is not likely to be associated with maintenance of sinus and her symptoms do not justify the use of an antiarrhythmic drug  She does however need anticoagulation. We will discontinue her aspirin and begin her on apixoban.  Her blood pressure is poorly controlled. She saw a nephrologist yesterday. She has diabetes and would be appropriate for her resume her lisinopril. I've asked that she follow-up with a nephrologist about that. In the interim we will also increase her amlodipine 5--10          Virl Axe

## 2016-02-21 DIAGNOSIS — D649 Anemia, unspecified: Secondary | ICD-10-CM | POA: Diagnosis not present

## 2016-02-21 DIAGNOSIS — E1122 Type 2 diabetes mellitus with diabetic chronic kidney disease: Secondary | ICD-10-CM | POA: Diagnosis not present

## 2016-02-21 DIAGNOSIS — N183 Chronic kidney disease, stage 3 (moderate): Secondary | ICD-10-CM | POA: Diagnosis not present

## 2016-02-21 DIAGNOSIS — I129 Hypertensive chronic kidney disease with stage 1 through stage 4 chronic kidney disease, or unspecified chronic kidney disease: Secondary | ICD-10-CM | POA: Diagnosis not present

## 2016-02-21 DIAGNOSIS — Z9181 History of falling: Secondary | ICD-10-CM | POA: Diagnosis not present

## 2016-02-21 DIAGNOSIS — R531 Weakness: Secondary | ICD-10-CM | POA: Diagnosis not present

## 2016-02-22 ENCOUNTER — Telehealth: Payer: Self-pay | Admitting: Internal Medicine

## 2016-02-22 NOTE — Telephone Encounter (Signed)
Pt is calling to tell us that she has tried to call her Kidney doctor about some medications that Dr Caryl Comes was asking about But she has not heard anything back from them, it was about her 20 MG Lisinopril  Would like to know if she is to be taking this or not Please advise.

## 2016-02-23 DIAGNOSIS — I129 Hypertensive chronic kidney disease with stage 1 through stage 4 chronic kidney disease, or unspecified chronic kidney disease: Secondary | ICD-10-CM | POA: Diagnosis not present

## 2016-02-23 DIAGNOSIS — Z9181 History of falling: Secondary | ICD-10-CM | POA: Diagnosis not present

## 2016-02-23 DIAGNOSIS — D649 Anemia, unspecified: Secondary | ICD-10-CM | POA: Diagnosis not present

## 2016-02-23 DIAGNOSIS — E1122 Type 2 diabetes mellitus with diabetic chronic kidney disease: Secondary | ICD-10-CM | POA: Diagnosis not present

## 2016-02-23 DIAGNOSIS — N183 Chronic kidney disease, stage 3 (moderate): Secondary | ICD-10-CM | POA: Diagnosis not present

## 2016-02-23 DIAGNOSIS — R531 Weakness: Secondary | ICD-10-CM | POA: Diagnosis not present

## 2016-02-23 NOTE — Telephone Encounter (Signed)
See below

## 2016-02-23 NOTE — Telephone Encounter (Signed)
Left detailed message on PT phone advising that he should call physical therapy ordering provider or pt's PCP to discuss high BP. Advised to call back if more discussion is needed

## 2016-02-23 NOTE — Telephone Encounter (Signed)
Informed patient to discuss starting Lisinopril with her nephrologist and call us with the decision. Patient verbalized understanding and agreeable to plan.

## 2016-02-23 NOTE — Telephone Encounter (Signed)
Attempted to reach pt to discuss original call in  (see first note) Voicemail has not been set up and unable to leave a message to call us back.

## 2016-02-23 NOTE — Telephone Encounter (Signed)
PHysical therapist called, Konrad Dolores [phone] called, states he cannot treat patient due to elevated BP. BP today 188/92. Please call.

## 2016-02-26 ENCOUNTER — Telehealth: Payer: Self-pay | Admitting: Cardiology

## 2016-02-26 DIAGNOSIS — Z9181 History of falling: Secondary | ICD-10-CM | POA: Diagnosis not present

## 2016-02-26 DIAGNOSIS — N183 Chronic kidney disease, stage 3 (moderate): Secondary | ICD-10-CM | POA: Diagnosis not present

## 2016-02-26 DIAGNOSIS — D649 Anemia, unspecified: Secondary | ICD-10-CM | POA: Diagnosis not present

## 2016-02-26 DIAGNOSIS — E1122 Type 2 diabetes mellitus with diabetic chronic kidney disease: Secondary | ICD-10-CM | POA: Diagnosis not present

## 2016-02-26 DIAGNOSIS — R531 Weakness: Secondary | ICD-10-CM | POA: Diagnosis not present

## 2016-02-26 DIAGNOSIS — I129 Hypertensive chronic kidney disease with stage 1 through stage 4 chronic kidney disease, or unspecified chronic kidney disease: Secondary | ICD-10-CM | POA: Diagnosis not present

## 2016-02-26 NOTE — Telephone Encounter (Signed)
Pt called in and stated that she received her home monitor. Pt stated that she doesn't even know how to set up the monitor. Pt is going to bring the monitor and wirex w/ her to her 04/2016 appt w/ MD so we can show her how to set up the monitor and use it.

## 2016-02-27 DIAGNOSIS — N183 Chronic kidney disease, stage 3 (moderate): Secondary | ICD-10-CM | POA: Diagnosis not present

## 2016-02-27 DIAGNOSIS — R531 Weakness: Secondary | ICD-10-CM | POA: Diagnosis not present

## 2016-02-27 DIAGNOSIS — I129 Hypertensive chronic kidney disease with stage 1 through stage 4 chronic kidney disease, or unspecified chronic kidney disease: Secondary | ICD-10-CM | POA: Diagnosis not present

## 2016-02-27 DIAGNOSIS — D649 Anemia, unspecified: Secondary | ICD-10-CM | POA: Diagnosis not present

## 2016-02-27 DIAGNOSIS — E1122 Type 2 diabetes mellitus with diabetic chronic kidney disease: Secondary | ICD-10-CM | POA: Diagnosis not present

## 2016-02-27 DIAGNOSIS — Z9181 History of falling: Secondary | ICD-10-CM | POA: Diagnosis not present

## 2016-02-28 DIAGNOSIS — I129 Hypertensive chronic kidney disease with stage 1 through stage 4 chronic kidney disease, or unspecified chronic kidney disease: Secondary | ICD-10-CM | POA: Diagnosis not present

## 2016-02-28 DIAGNOSIS — R531 Weakness: Secondary | ICD-10-CM | POA: Diagnosis not present

## 2016-02-28 DIAGNOSIS — E1122 Type 2 diabetes mellitus with diabetic chronic kidney disease: Secondary | ICD-10-CM | POA: Diagnosis not present

## 2016-02-28 DIAGNOSIS — Z9181 History of falling: Secondary | ICD-10-CM | POA: Diagnosis not present

## 2016-02-28 DIAGNOSIS — N183 Chronic kidney disease, stage 3 (moderate): Secondary | ICD-10-CM | POA: Diagnosis not present

## 2016-02-28 DIAGNOSIS — D649 Anemia, unspecified: Secondary | ICD-10-CM | POA: Diagnosis not present

## 2016-03-11 ENCOUNTER — Other Ambulatory Visit: Payer: Self-pay

## 2016-03-11 MED ORDER — ATORVASTATIN CALCIUM 20 MG PO TABS: 20.0000 mg | ORAL_TABLET | Freq: Every day | ORAL | 6 refills | Status: DC

## 2016-03-18 ENCOUNTER — Encounter: Payer: Self-pay | Admitting: Internal Medicine

## 2016-03-20 ENCOUNTER — Other Ambulatory Visit: Payer: Self-pay

## 2016-03-20 MED ORDER — FUROSEMIDE 20 MG PO TABS: 40.0000 mg | ORAL_TABLET | Freq: Every day | ORAL | 3 refills | Status: DC

## 2016-03-22 DIAGNOSIS — N2581 Secondary hyperparathyroidism of renal origin: Secondary | ICD-10-CM | POA: Diagnosis not present

## 2016-03-22 DIAGNOSIS — N183 Chronic kidney disease, stage 3 (moderate): Secondary | ICD-10-CM | POA: Diagnosis not present

## 2016-03-22 DIAGNOSIS — I129 Hypertensive chronic kidney disease with stage 1 through stage 4 chronic kidney disease, or unspecified chronic kidney disease: Secondary | ICD-10-CM | POA: Diagnosis not present

## 2016-03-22 DIAGNOSIS — R809 Proteinuria, unspecified: Secondary | ICD-10-CM | POA: Diagnosis not present

## 2016-03-22 DIAGNOSIS — E1122 Type 2 diabetes mellitus with diabetic chronic kidney disease: Secondary | ICD-10-CM | POA: Diagnosis not present

## 2016-04-02 ENCOUNTER — Telehealth: Payer: Self-pay | Admitting: Internal Medicine

## 2016-04-02 NOTE — Telephone Encounter (Signed)
Pharmacy calling asking about Eliquis  Pt was told to be taking 2 a day on 02/20/16  But she has only been taking 1 a day Would like to know what needs to be done  Please call patient back

## 2016-04-02 NOTE — Telephone Encounter (Signed)
S/w pt who states she was unaware Eliquis was BID and has only been taking it qd since it was prescribed in July. She received first month free and when she picked up prescription today, she noticed it was BID. Pt now understands correct dosage with no further questions at this time. Will forward to Dr. Caryl Comes to make aware.

## 2016-04-05 ENCOUNTER — Telehealth: Payer: Self-pay

## 2016-04-05 NOTE — Telephone Encounter (Signed)
Prior auth for Eliquis 5 mg bid obtained from [employer]. Good through 04/03/2017.

## 2016-04-09 ENCOUNTER — Other Ambulatory Visit: Payer: Self-pay

## 2016-04-16 ENCOUNTER — Other Ambulatory Visit: Payer: Self-pay

## 2016-04-16 MED ORDER — METOPROLOL SUCCINATE ER 100 MG PO TB24: 100.0000 mg | ORAL_TABLET | Freq: Every day | ORAL | 3 refills | Status: DC

## 2016-04-22 NOTE — Progress Notes (Signed)
Patient Care Team: Guadalupe Maple, MD as PCP - General (Family Medicine)   HPI  Renee Boyd is a 65 y.o. female Seen in followup for largely asymptomatic permanent atrial fibrillation.  She transferred care to here 7/17.  At that time, anticoagulation was initiated. He was hypertensive and her amlodipine was increased.  She is s/p pacemaker insertion [employer] 2014  She has hx of HTN which has been poorly controlled.  She has DM and Grade 3 Renal insufficiency\\ Echo 7/17 showed normal LVEF  But PUL HTN with Pressures > 65 ( no E/E' recorded)   She has had a problem with chest pain. This had been responsive in the past to nifedipine which is been discontinued somewhere recently. We have no data as to whether she has coronary artery disease.  She's been seen by nephrology but would not be seen again until for aborted. Her renal function has improved as noted below.  She has noted significant edema which worsens as the day goes on. This is been concurrent with the increase in her amlodipine as well as her son being treated with ongoing problems for cancer. There have been a lot of fast foods etc.  Records and Results Reviewed 7/17 Cr 1.46 K 3.7  Past Medical History:  Diagnosis Date  . Diabetes mellitus without complication ([place])   . Hypertension   . Pacemaker     Past Surgical History:  Procedure Laterality Date  . ABDOMINAL HYSTERECTOMY      Current Outpatient Prescriptions  Medication Sig Dispense Refill  . amLODipine (NORVASC) 10 MG tablet Take 1 tablet (10 mg total) by mouth daily. 90 tablet 3  . apixaban (ELIQUIS) 5 MG TABS tablet Take 1 tablet (5 mg total) by mouth 2 (two) times daily. 60 tablet 11  . atorvastatin (LIPITOR) 20 MG tablet Take 1 tablet (20 mg total) by mouth daily. 30 tablet 6  . furosemide (LASIX) 20 MG tablet Take 2 tablets (40 mg total) by mouth daily. 180 tablet 3  . gabapentin (NEURONTIN) 300 MG capsule Take 300 mg by mouth 2 (two) times  daily.    Marland Kitchen levothyroxine (SYNTHROID, LEVOTHROID) 50 MCG tablet Take 50 mcg by mouth daily before breakfast.    . metoprolol succinate (TOPROL-XL) 100 MG 24 hr tablet Take 1 tablet (100 mg total) by mouth daily. Take with or immediately following a meal. 30 tablet 3  . omeprazole (PRILOSEC) 20 MG capsule Take 20 mg by mouth 2 (two) times daily before a meal.    . SitaGLIPtin-MetFORMIN HCl (JANUMET XR) [phone] MG TB24 Take 1 tablet by mouth daily.    . temazepam (RESTORIL) 30 MG capsule Take 30 mg by mouth at bedtime as needed for sleep.    . traMADol (ULTRAM) 50 MG tablet Take 50 mg by mouth 2 (two) times daily.     No current facility-administered medications for this visit.     Allergies  Allergen Reactions  . Morphine And Related Itching  . Percocet [Oxycodone-Acetaminophen] Other (See Comments)    Hallucinations       Review of Systems negative except from HPI and PMH  Physical Exam BP (!) 160/80   Pulse 100   Ht 5' (1.524 m)   Wt 165 lb 12.8 oz (75.2 kg)   SpO2 95%   BMI 32.38 kg/m  Well developed and well nourished in no acute distress HENT normal E scleral and icterus clear Neck Supple JVP flat; carotids brisk and full Clear  to ausculation  Regular rate and rhythm, no murmurs gallops or rub Soft with active bowel sounds No clubbing cyanosis 1+ Edema Alert and oriented, grossly normal motor and sensory function Skin Warm and Dry    Assessment and  Plan  Atrial fibrillation -persistent  Hypertension   Pacemaker    Edema  Complete heart block   No active bleeding issues. We will continue apixaban  Blood pressures poorly controlled. I suggested she get a blood pressure cuff. We also reviewed diet issues. She is under a great deal of stress with her son and his cancer.  We'll have her increase her furosemide take it once a day. Hopefully this will suffice for her edema.  In addition, we will discontinue her amlodipine and put her back on nifedipine  which seemed to work. In the event that she other therapy, either a combined also beta blocker IV labetalol or carvedilol might be used to replace for metoprolol or hydralazine could be added. She will be seeing her primary care physician in a couple weeks.  We spent more than 50% of our >25 min visit in face to face counseling regarding the above     Current medicines are reviewed at length with the patient today .  The patient does not  have concerns regarding medicines.

## 2016-04-23 ENCOUNTER — Ambulatory Visit (INDEPENDENT_AMBULATORY_CARE_PROVIDER_SITE_OTHER): Payer: Medicare Other | Admitting: Internal Medicine

## 2016-04-23 ENCOUNTER — Encounter: Payer: Medicare Other | Admitting: Internal Medicine

## 2016-04-23 VITALS — BP 160/80 | HR 100 | Ht 60.0 in | Wt 165.8 lb

## 2016-04-23 DIAGNOSIS — I442 Atrioventricular block, complete: Secondary | ICD-10-CM

## 2016-04-23 DIAGNOSIS — Z950 Presence of cardiac pacemaker: Secondary | ICD-10-CM

## 2016-04-23 DIAGNOSIS — I481 Persistent atrial fibrillation: Secondary | ICD-10-CM | POA: Diagnosis not present

## 2016-04-23 DIAGNOSIS — I4819 Other persistent atrial fibrillation: Secondary | ICD-10-CM

## 2016-04-23 MED ORDER — ATORVASTATIN CALCIUM 20 MG PO TABS: 20.0000 mg | ORAL_TABLET | Freq: Every day | ORAL | 3 refills | Status: DC

## 2016-04-23 MED ORDER — FUROSEMIDE 20 MG PO TABS: ORAL_TABLET | ORAL | Status: DC

## 2016-04-23 MED ORDER — METOPROLOL SUCCINATE ER 100 MG PO TB24: 100.0000 mg | ORAL_TABLET | Freq: Every day | ORAL | 3 refills | Status: DC

## 2016-04-23 MED ORDER — APIXABAN 5 MG PO TABS: 5.0000 mg | ORAL_TABLET | Freq: Two times a day (BID) | ORAL | 3 refills | Status: DC

## 2016-04-23 MED ORDER — NIFEDIPINE ER 60 MG PO TB24: 60.0000 mg | ORAL_TABLET | Freq: Every day | ORAL | Status: DC

## 2016-04-23 NOTE — Patient Instructions (Signed)
Medication Instructions: - Your physician has recommended you make the following change in your medication:  1) Stop norvasc (amlodipine)  2) Take lasix (furosemide) 20 mg two tablets (40 mg) every morning 3) Restart nifedipine 60 mg one tablet by mouth once daily  Labwork: - none today  Procedures/Testing: - none today  Follow-Up: - Remote monitoring is used to monitor your Pacemaker of ICD from home. This monitoring reduces the number of office visits required to check your device to one time per year. It allows [place] to keep an eye on the functioning of your device to ensure it is working properly. You are scheduled for a device check from home on 07/23/16. You may send your transmission at any time that day. If you have a wireless device, the transmission will be sent automatically. After your physician reviews your transmission, you will receive a postcard with your next transmission date.  - Your physician wants you to follow-up in: 1 year with Dr. Caryl Comes.  You will receive a reminder letter in the mail two months in advance. If you don't receive a letter, please call our office to schedule the follow-up appointment.  Any Additional Special Instructions Will Be Listed Below (If Applicable).     If you need a refill on your cardiac medications before your next appointment, please call your pharmacy.

## 2016-04-27 LAB — CUP PACEART INCLINIC DEVICE CHECK
Brady Statistic AP VS Percent: 0.01 %
Brady Statistic AS VP Percent: 25.5 %
Brady Statistic RA Percent Paced: 73.96 %
Implantable Lead Implant Date: 20141217
Implantable Lead Location: 753860
Implantable Lead Model: 5076
Lead Channel Impedance Value: 551 Ohm
Lead Channel Impedance Value: 589 Ohm
Lead Channel Pacing Threshold Pulse Width: 0.4 ms
Lead Channel Sensing Intrinsic Amplitude: 3 mV
Lead Channel Setting Pacing Pulse Width: 0.4 ms
Lead Channel Setting Sensing Sensitivity: 0.9 mV
MDC IDC LEAD IMPLANT DT: 20141217
MDC IDC LEAD LOCATION: 753859
MDC IDC MSMT BATTERY REMAINING LONGEVITY: 72 mo
MDC IDC MSMT BATTERY VOLTAGE: 3 V
MDC IDC MSMT LEADCHNL RA IMPEDANCE VALUE: 399 Ohm
MDC IDC MSMT LEADCHNL RA IMPEDANCE VALUE: 399 Ohm
MDC IDC MSMT LEADCHNL RA PACING THRESHOLD AMPLITUDE: 0.5 V
MDC IDC MSMT LEADCHNL RA PACING THRESHOLD PULSEWIDTH: 0.4 ms
MDC IDC MSMT LEADCHNL RV PACING THRESHOLD AMPLITUDE: 0.75 V
MDC IDC SESS DTM: 20170919132409
MDC IDC SET LEADCHNL RA PACING AMPLITUDE: 2 V
MDC IDC SET LEADCHNL RV PACING AMPLITUDE: 2.5 V
MDC IDC STAT BRADY AP VP PERCENT: 73.96 %
MDC IDC STAT BRADY AS VS PERCENT: 0.54 %
MDC IDC STAT BRADY RV PERCENT PACED: 99.45 %

## 2016-05-02 ENCOUNTER — Encounter: Payer: Self-pay | Admitting: Internal Medicine

## 2016-05-13 ENCOUNTER — Encounter: Payer: Self-pay | Admitting: Unknown Physician Specialty

## 2016-05-13 ENCOUNTER — Other Ambulatory Visit: Payer: Self-pay

## 2016-05-13 ENCOUNTER — Ambulatory Visit (INDEPENDENT_AMBULATORY_CARE_PROVIDER_SITE_OTHER): Payer: Medicare Other | Admitting: Unknown Physician Specialty

## 2016-05-13 VITALS — BP 101/67 | HR 60 | Temp 97.7°F | Ht 60.5 in | Wt 164.8 lb

## 2016-05-13 DIAGNOSIS — E1122 Type 2 diabetes mellitus with diabetic chronic kidney disease: Secondary | ICD-10-CM

## 2016-05-13 DIAGNOSIS — E038 Other specified hypothyroidism: Secondary | ICD-10-CM

## 2016-05-13 DIAGNOSIS — E7849 Other hyperlipidemia: Secondary | ICD-10-CM

## 2016-05-13 DIAGNOSIS — N184 Chronic kidney disease, stage 4 (severe): Secondary | ICD-10-CM | POA: Insufficient documentation

## 2016-05-13 DIAGNOSIS — M542 Cervicalgia: Secondary | ICD-10-CM | POA: Diagnosis not present

## 2016-05-13 DIAGNOSIS — E784 Other hyperlipidemia: Secondary | ICD-10-CM | POA: Diagnosis not present

## 2016-05-13 DIAGNOSIS — I459 Conduction disorder, unspecified: Secondary | ICD-10-CM

## 2016-05-13 DIAGNOSIS — G8929 Other chronic pain: Secondary | ICD-10-CM

## 2016-05-13 DIAGNOSIS — E1121 Type 2 diabetes mellitus with diabetic nephropathy: Secondary | ICD-10-CM | POA: Insufficient documentation

## 2016-05-13 DIAGNOSIS — N183 Chronic kidney disease, stage 3 unspecified: Secondary | ICD-10-CM

## 2016-05-13 DIAGNOSIS — F5101 Primary insomnia: Secondary | ICD-10-CM | POA: Diagnosis not present

## 2016-05-13 DIAGNOSIS — M25559 Pain in unspecified hip: Secondary | ICD-10-CM

## 2016-05-13 DIAGNOSIS — E1169 Type 2 diabetes mellitus with other specified complication: Secondary | ICD-10-CM | POA: Insufficient documentation

## 2016-05-13 LAB — HEMOGLOBIN A1C

## 2016-05-13 MED ORDER — TRAMADOL HCL 50 MG PO TABS: 50.0000 mg | ORAL_TABLET | Freq: Two times a day (BID) | ORAL | 3 refills | Status: DC

## 2016-05-13 MED ORDER — CYCLOBENZAPRINE HCL 10 MG PO TABS: 10.0000 mg | ORAL_TABLET | Freq: Three times a day (TID) | ORAL | 1 refills | Status: DC | PRN

## 2016-05-13 NOTE — Assessment & Plan Note (Signed)
hGB a1c is 6.7

## 2016-05-13 NOTE — Progress Notes (Signed)
BP 101/67 (BP Location: Left Arm, Cuff Size: Large)   Pulse 60   Temp 97.7 F (36.5 C)   Ht 5' 0.5" (1.537 m)   Wt 164 lb 12.8 oz (74.8 kg)   SpO2 98%   BMI 31.66 kg/m    Subjective:    Patient ID: Renee Boyd, female    DOB: 10/15/1950, 64 y.o.   MRN: [id number]  HPI: Renee Boyd is a 65 y.o. female  Chief Complaint  Patient presents with  . Establish Care  . Diabetes    pt states she had eye exam in May, will fax form to [facility]   . Medication Refill    pt states she would like a refill on tramadol   Social History   Social History  . Marital status: Divorced    Spouse name: N/A  . Number of children: N/A  . Years of education: N/A   Occupational History  . Not on file.   Social History Main Topics  . Smoking status: Former Smoker    Quit date: 06/27/2005  . Smokeless tobacco: Never Used     Comment: quit Jun 27 2005  . Alcohol use No  . Drug use: No  . Sexual activity: No   Other Topics Concern  . Not on file   Social History Narrative  . No narrative on file   Family History  Problem Relation Age of Onset  . Hypertension Other   . Heart disease Mother     MI  . Alcohol abuse Father   . Autism Son   . Heart disease Maternal Grandmother   . Cancer Son     colon   Past Medical History:  Diagnosis Date  . Diabetes mellitus without complication ([place])   . Hypertension   . Pacemaker   . Stroke [facility])    Past Surgical History:  Procedure Laterality Date  . ABDOMINAL HYSTERECTOMY    . CHOLECYSTECTOMY    . TONSILLECTOMY    . TUBAL LIGATION       Does not want to fell out PHQ 9 as she just lost her son  Loanne Drilling in neck Niece gave her muscle relaxants which helped  Diabetes: Using medications without difficulties No hypoglycemic episodes No hyperglycemic episodes Feet problems: non Blood Sugars averaging:107 in the AM eye exam within last year Last Hgb A1C: high with Dr. Abigail Butts in August.    Hypertension  She is taking  Procardia and feels it is causing cramps.  She would like to be back on Amlodipine Average home BPs   Using medication without problems or lightheadedness No chest pain with exertion or shortness of breath Had some swelling but changed her diet and it's better.    Elevated Cholesterol Using medications without problems No Muscle aches  Diet: Eating better   Insomnia Pt has been on Restoril for about 10-11 years and it's "the only thing that works."  She was originally been put on it by Dr. Rebecka Apley.  Goes to bet at 3A until 10A.  She drinks 2 cups of coffee in the AM.  She does admit to caffeine in the evening.    Heart block Has a pacemaker and sees a cardiologist  Pain States she takes Tramadol originally from Dr. Lavera Guise.  She takes 2/day.  She needs a refill.  She also takes Gabapenting    Relevant past medical, surgical, family and social history reviewed and updated as indicated. Interim medical history since our last visit  reviewed. Allergies and medications reviewed and updated.  Review of Systems  Per HPI unless specifically indicated above     Objective:    BP 101/67 (BP Location: Left Arm, Cuff Size: Large)   Pulse 60   Temp 97.7 F (36.5 C)   Ht 5' 0.5" (1.537 m)   Wt 164 lb 12.8 oz (74.8 kg)   SpO2 98%   BMI 31.66 kg/m   Wt Readings from Last 3 Encounters:  05/13/16 164 lb 12.8 oz (74.8 kg)  04/23/16 165 lb 12.8 oz (75.2 kg)  02/20/16 158 lb 12.8 oz (72 kg)    Physical Exam  Constitutional: She is oriented to person, place, and time. She appears well-developed and well-nourished. No distress.  HENT:  Head: Normocephalic and atraumatic.  Eyes: Conjunctivae and lids are normal. Right eye exhibits no discharge. Left eye exhibits no discharge. No scleral icterus.  Neck: Normal range of motion. Neck supple. No JVD present. Carotid bruit is not present.  Cardiovascular: Normal rate, regular rhythm and normal heart sounds.   Pulmonary/Chest: Effort normal and  breath sounds normal.  Abdominal: Normal appearance. There is no splenomegaly or hepatomegaly.  Musculoskeletal: Normal range of motion.  Neurological: She is alert and oriented to person, place, and time.  Skin: Skin is warm, dry and intact. No rash noted. No pallor.  Psychiatric: She has a normal mood and affect. Her behavior is normal. Judgment and thought content normal.      Assessment & Plan:   Problem List Items Addressed This Visit      Unprioritized   Chronic hip pain   Relevant Medications   cyclobenzaprine (FLEXERIL) 10 MG tablet   traMADol (ULTRAM) 50 MG tablet   Chronic pain    Hip pain.  Will refill Tramadol      Relevant Medications   cyclobenzaprine (FLEXERIL) 10 MG tablet   traMADol (ULTRAM) 50 MG tablet   Controlled type 2 diabetes mellitus with chronic kidney disease ([place])    hGB a1c is 6.7      Relevant Orders   Bayer DCA Hb A1c Waived   Comprehensive metabolic panel   Microalbumin, Urine Waived   Uric acid   Heart block   Relevant Orders   Uric acid   Primary insomnia - Primary    Pt has been on Restoril for years and I won't make any changes at this time.  Goal is to have her weaned off Restoril.        Relevant Orders   Uric acid   Stage 3 chronic kidney disease   Relevant Orders   Uric acid    Other Visit Diagnoses    Other hyperlipidemia       Relevant Orders   Lipid Panel w/o Chol/HDL Ratio   Uric acid   Other specified hypothyroidism       Relevant Orders   TSH   Uric acid   Neck pain       Muscle relaxants.  Warned not to take with Restoril       Follow up plan: Return in about 3 months (around 08/13/2016).

## 2016-05-13 NOTE — Assessment & Plan Note (Signed)
Pt has been on Restoril for years and I won't make any changes at this time.  Goal is to have her weaned off Restoril.

## 2016-05-13 NOTE — Assessment & Plan Note (Signed)
Hip pain.  Will refill Tramadol

## 2016-05-14 ENCOUNTER — Telehealth: Payer: Self-pay | Admitting: Unknown Physician Specialty

## 2016-05-14 LAB — COMPREHENSIVE METABOLIC PANEL
ALK PHOS: 86 IU/L (ref 39–117)
ALT: 10 IU/L (ref 0–32)
AST: 11 IU/L (ref 0–40)
Albumin/Globulin Ratio: 1.8 (ref 1.2–2.2)
Albumin: 4.6 g/dL (ref 3.6–4.8)
BUN/Creatinine Ratio: 19 (ref 12–28)
BUN: 26 mg/dL (ref 8–27)
Bilirubin Total: <0.2 mg/dL (ref 0.0–1.2)
CO2: 22 mmol/L (ref 18–29)
CREATININE: 1.36 mg/dL — AB (ref 0.57–1.00)
Calcium: 10.3 mg/dL (ref 8.7–10.3)
Chloride: 100 mmol/L (ref 96–106)
GFR calc Af Amer: 46 mL/min/{1.73_m2} — ABNORMAL LOW (ref >59)
GFR calc non Af Amer: 40 mL/min/{1.73_m2} — ABNORMAL LOW (ref >59)
GLUCOSE: 96 mg/dL (ref 65–99)
Globulin, Total: 2.6 g/dL (ref 1.5–4.5)
Potassium: 4.2 mmol/L (ref 3.5–5.2)
Sodium: 141 mmol/L (ref 134–144)
Total Protein: 7.2 g/dL (ref 6.0–8.5)

## 2016-05-14 LAB — MICROALBUMIN, URINE WAIVED
CREATININE, URINE WAIVED: 10 mg/dL (ref 10–300)
MICROALB, UR WAIVED: 10 mg/L (ref 0–19)
Microalb/Creat Ratio: <30 mg/g (ref <30)

## 2016-05-14 LAB — LIPID PANEL W/O CHOL/HDL RATIO
CHOLESTEROL TOTAL: 122 mg/dL (ref 100–199)
HDL: 41 mg/dL (ref >39)
LDL CALC: 55 mg/dL (ref 0–99)
Triglycerides: 129 mg/dL (ref 0–149)
VLDL Cholesterol Cal: 26 mg/dL (ref 5–40)

## 2016-05-14 LAB — BAYER DCA HB A1C WAIVED: HB A1C: 6.7 % (ref <7.0)

## 2016-05-14 LAB — TSH: TSH: 0.304 u[IU]/mL — ABNORMAL LOW (ref 0.450–4.500)

## 2016-05-14 LAB — URIC ACID: URIC ACID: 10.5 mg/dL — AB (ref 2.5–7.1)

## 2016-05-14 NOTE — Telephone Encounter (Signed)
No answer

## 2016-05-15 NOTE — Telephone Encounter (Signed)
Routing to provider  

## 2016-05-15 NOTE — Telephone Encounter (Signed)
Discussed with pt overactive thyroid.  Break Synthroid in 1/2 and recheck in 3 months

## 2016-05-20 ENCOUNTER — Other Ambulatory Visit: Payer: Self-pay | Admitting: Unknown Physician Specialty

## 2016-05-20 MED ORDER — TEMAZEPAM 30 MG PO CAPS: 30.0000 mg | ORAL_CAPSULE | Freq: Every evening | ORAL | 0 refills | Status: DC | PRN

## 2016-05-20 NOTE — Telephone Encounter (Signed)
Pt called stated she needs a refill on Restoril. Sherian Rein is Consulting civil engineer in [place]. Thanks.

## 2016-05-20 NOTE — Telephone Encounter (Signed)
Routing to provider  

## 2016-05-21 ENCOUNTER — Telehealth: Payer: Self-pay | Admitting: Unknown Physician Specialty

## 2016-05-21 NOTE — Telephone Encounter (Signed)
Pt called to find out why her pharmacy hasn't heard from [place] about her refill on temazepam 30mg . Her pharmacy is [facility]. Messaged was routed to MA.

## 2016-05-21 NOTE — Telephone Encounter (Signed)
Called and let patient know that rx was just faxed a little bit ago to [facility].

## 2016-06-03 ENCOUNTER — Other Ambulatory Visit: Payer: Self-pay | Admitting: Unknown Physician Specialty

## 2016-06-03 MED ORDER — GABAPENTIN 300 MG PO CAPS: 300.0000 mg | ORAL_CAPSULE | Freq: Two times a day (BID) | ORAL | 1 refills | Status: DC

## 2016-06-03 NOTE — Telephone Encounter (Signed)
Routing to provider  

## 2016-06-03 NOTE — Telephone Encounter (Signed)
Pt would like to get a refill for gabapentin (NEURONTIN) 300 MG capsule [facility] with a 90 day supply.

## 2016-06-17 ENCOUNTER — Other Ambulatory Visit: Payer: Self-pay

## 2016-06-17 MED ORDER — TEMAZEPAM 30 MG PO CAPS: 30.0000 mg | ORAL_CAPSULE | Freq: Every evening | ORAL | 0 refills | Status: DC | PRN

## 2016-06-19 ENCOUNTER — Telehealth: Payer: Self-pay | Admitting: Unknown Physician Specialty

## 2016-06-19 NOTE — Telephone Encounter (Signed)
Rx was faxed to pharmacy 2 days ago, called and pharmacy stated that they did not get it so I called it in for the patient. Will call her and let her know.

## 2016-06-19 NOTE — Telephone Encounter (Signed)
Patient notified about RX.

## 2016-06-21 ENCOUNTER — Telehealth: Payer: Self-pay | Admitting: Unknown Physician Specialty

## 2016-06-21 NOTE — Telephone Encounter (Signed)
Pt needs prior auth for temazepam (RESTORIL) 30 MG capsule

## 2016-06-24 NOTE — Telephone Encounter (Signed)
[place], this is one of your patients that needs med prior [place].

## 2016-06-24 NOTE — Telephone Encounter (Signed)
I was opening Cover my meds to do the PA, looked I patient's chart and saw a letter stating that this medication was approved on Friday. Called the number listed and spoke with Caren Griffins at [facility]. She stated that the medication was approved on Friday. I asked if the pharmacy knew and she stated that they notified the pharmacy.

## 2016-07-01 DIAGNOSIS — H40003 Preglaucoma, unspecified, bilateral: Secondary | ICD-10-CM | POA: Diagnosis not present

## 2016-07-09 ENCOUNTER — Other Ambulatory Visit: Payer: Self-pay | Admitting: Unknown Physician Specialty

## 2016-07-09 ENCOUNTER — Other Ambulatory Visit: Payer: Self-pay | Admitting: *Deleted

## 2016-07-09 ENCOUNTER — Telehealth: Payer: Self-pay | Admitting: Internal Medicine

## 2016-07-09 MED ORDER — SITAGLIP PHOS-METFORMIN HCL ER 100-1000 MG PO TB24: 1.0000 | ORAL_TABLET | Freq: Every day | ORAL | 0 refills | Status: DC

## 2016-07-09 NOTE — Telephone Encounter (Signed)
Pt mentioned that Dr. Garen Lah suggested pt take Lisinopril 20 mg qd. Please advise pt she would like for nurse to contact Dr. Garen Lah office to discuss labs/him approving pt to take Lisinopril 20 mg.

## 2016-07-09 NOTE — Telephone Encounter (Signed)
Order form for test strips filled out. Will get Renee Boyd to sign when she returns tomorrow and then fax to the pharmacy. Will route to provider now for medication refill.

## 2016-07-09 NOTE — Telephone Encounter (Signed)
I called and spoke with the patient. I advised her that Dr. Garen Lah should be the one to refill her lisinopril. She states she does recall giving her recommendations for this drug and having her take lisinopril 40 , but then asked her to cut this in 1/2 and take 20 mg once daily. She is agreeable and will call Dr. Chauncy Passy office in the morning to follow up on her refill.

## 2016-07-09 NOTE — Telephone Encounter (Signed)
°*  STAT* If patient is at the pharmacy, call can be transferred to refill team.   1. Which medications need to be refilled? (please list name of each medication and dose if known) lisinopril 20 mg   2. Which pharmacy/location (including street and city if local pharmacy) is medication to be sent to? [facility]   3. Do they need a 30 day or 90 day supply? 90 day

## 2016-07-09 NOTE — Telephone Encounter (Signed)
There is a note from her hospital discharge to stop linisinopril on 02/11/16. She called in later in July and was advised to discuss restarting this with her nephrologist. Do you mind finding out who restarted this for her, because I don't see that Dr. Caryl Comes did.  Thanks!

## 2016-07-09 NOTE — Telephone Encounter (Signed)
Pt requesting Rx Lisinopril please advise if ok to refill not on pt last OV with Dr. Caryl Comes.

## 2016-07-09 NOTE — Telephone Encounter (Signed)
Patient would like a refill for Janumet 100/1000 for a 90 day supply and Bayer contour next test strips to test twice daily sent to [facility]

## 2016-07-10 DIAGNOSIS — H40003 Preglaucoma, unspecified, bilateral: Secondary | ICD-10-CM | POA: Diagnosis not present

## 2016-07-22 ENCOUNTER — Other Ambulatory Visit: Payer: Self-pay | Admitting: Unknown Physician Specialty

## 2016-07-22 MED ORDER — TEMAZEPAM 30 MG PO CAPS: 30.0000 mg | ORAL_CAPSULE | Freq: Every evening | ORAL | 0 refills | Status: DC | PRN

## 2016-07-22 MED ORDER — OMEPRAZOLE 20 MG PO CPDR: 20.0000 mg | DELAYED_RELEASE_CAPSULE | Freq: Two times a day (BID) | ORAL | 3 refills | Status: DC

## 2016-07-22 NOTE — Telephone Encounter (Signed)
Pt needs a 3 month supply of omerpazole er 20 mg one capsule twice daily this is a new script for Dr Julian Hy per the patient.   Temazepan 30 mg capsule once daily at bedtime  [employer] [facility]   [phone]

## 2016-07-23 ENCOUNTER — Telehealth: Payer: Self-pay | Admitting: Cardiology

## 2016-07-23 ENCOUNTER — Ambulatory Visit (INDEPENDENT_AMBULATORY_CARE_PROVIDER_SITE_OTHER): Payer: Medicare Other | Admitting: *Deleted

## 2016-07-23 ENCOUNTER — Other Ambulatory Visit: Payer: Self-pay | Admitting: Unknown Physician Specialty

## 2016-07-23 DIAGNOSIS — I442 Atrioventricular block, complete: Secondary | ICD-10-CM | POA: Diagnosis not present

## 2016-07-23 NOTE — Progress Notes (Signed)
Remote pacemaker transmission.   

## 2016-07-23 NOTE — Telephone Encounter (Signed)
Spoke with pt and reminded pt of remote transmission that is due today. Pt verbalized understanding.   

## 2016-07-23 NOTE — Telephone Encounter (Signed)
RX filled yesterday, faxed to pharmacy today. Malachy Mood please refuse this medication as it has already been done.

## 2016-07-24 ENCOUNTER — Encounter: Payer: Self-pay | Admitting: Cardiology

## 2016-07-26 LAB — CUP PACEART REMOTE DEVICE CHECK
Brady Statistic AP VS Percent: 0 %
Brady Statistic AS VP Percent: 61.26 %
Implantable Lead Implant Date: 20141217
Implantable Lead Location: 753859
Implantable Lead Model: 5076
Lead Channel Impedance Value: 418 Ohm
Lead Channel Pacing Threshold Pulse Width: 0.4 ms
Lead Channel Sensing Intrinsic Amplitude: 11.875 mV
Lead Channel Sensing Intrinsic Amplitude: 11.875 mV
Lead Channel Setting Pacing Amplitude: 2 V
Lead Channel Setting Pacing Amplitude: 2.5 V
Lead Channel Setting Pacing Pulse Width: 0.4 ms
Lead Channel Setting Sensing Sensitivity: 0.9 mV
MDC IDC LEAD IMPLANT DT: 20141217
MDC IDC LEAD LOCATION: 753860
MDC IDC MSMT BATTERY REMAINING LONGEVITY: 61 mo
MDC IDC MSMT BATTERY VOLTAGE: 3 V
MDC IDC MSMT LEADCHNL RA IMPEDANCE VALUE: 380 Ohm
MDC IDC MSMT LEADCHNL RA SENSING INTR AMPL: 4.375 mV
MDC IDC MSMT LEADCHNL RA SENSING INTR AMPL: 4.375 mV
MDC IDC MSMT LEADCHNL RV IMPEDANCE VALUE: 570 Ohm
MDC IDC MSMT LEADCHNL RV IMPEDANCE VALUE: 627 Ohm
MDC IDC MSMT LEADCHNL RV PACING THRESHOLD AMPLITUDE: 0.625 V
MDC IDC PG IMPLANT DT: 20141217
MDC IDC SESS DTM: 20171219195801
MDC IDC STAT BRADY AP VP PERCENT: 38.43 %
MDC IDC STAT BRADY AS VS PERCENT: 0.3 %
MDC IDC STAT BRADY RA PERCENT PACED: 38.04 %
MDC IDC STAT BRADY RV PERCENT PACED: 99.7 %

## 2016-08-13 ENCOUNTER — Ambulatory Visit: Payer: Medicare Other | Admitting: Unknown Physician Specialty

## 2016-08-21 ENCOUNTER — Ambulatory Visit: Payer: Self-pay | Admitting: Unknown Physician Specialty

## 2016-08-22 ENCOUNTER — Ambulatory Visit: Payer: Medicare Other | Admitting: Unknown Physician Specialty

## 2016-08-23 ENCOUNTER — Other Ambulatory Visit: Payer: Self-pay | Admitting: Unknown Physician Specialty

## 2016-09-10 ENCOUNTER — Encounter: Payer: Self-pay | Admitting: Unknown Physician Specialty

## 2016-09-10 ENCOUNTER — Ambulatory Visit (INDEPENDENT_AMBULATORY_CARE_PROVIDER_SITE_OTHER): Payer: Medicare Other | Admitting: Unknown Physician Specialty

## 2016-09-10 ENCOUNTER — Other Ambulatory Visit: Payer: Self-pay

## 2016-09-10 ENCOUNTER — Other Ambulatory Visit: Payer: Self-pay | Admitting: Unknown Physician Specialty

## 2016-09-10 VITALS — BP 125/85 | HR 64 | Temp 97.8°F | Ht 61.0 in | Wt 174.4 lb

## 2016-09-10 DIAGNOSIS — E039 Hypothyroidism, unspecified: Secondary | ICD-10-CM | POA: Insufficient documentation

## 2016-09-10 DIAGNOSIS — M7552 Bursitis of left shoulder: Secondary | ICD-10-CM | POA: Insufficient documentation

## 2016-09-10 DIAGNOSIS — Z23 Encounter for immunization: Secondary | ICD-10-CM | POA: Diagnosis not present

## 2016-09-10 DIAGNOSIS — I10 Essential (primary) hypertension: Secondary | ICD-10-CM | POA: Diagnosis not present

## 2016-09-10 DIAGNOSIS — E1122 Type 2 diabetes mellitus with diabetic chronic kidney disease: Secondary | ICD-10-CM | POA: Diagnosis not present

## 2016-09-10 DIAGNOSIS — N183 Chronic kidney disease, stage 3 unspecified: Secondary | ICD-10-CM

## 2016-09-10 DIAGNOSIS — Z0000 Encounter for general adult medical examination without abnormal findings: Secondary | ICD-10-CM | POA: Diagnosis not present

## 2016-09-10 DIAGNOSIS — I129 Hypertensive chronic kidney disease with stage 1 through stage 4 chronic kidney disease, or unspecified chronic kidney disease: Secondary | ICD-10-CM | POA: Insufficient documentation

## 2016-09-10 DIAGNOSIS — M67912 Unspecified disorder of synovium and tendon, left shoulder: Secondary | ICD-10-CM | POA: Diagnosis not present

## 2016-09-10 LAB — BAYER DCA HB A1C WAIVED: HB A1C (BAYER DCA - WAIVED): 6.7 % (ref <7.0)

## 2016-09-10 NOTE — Assessment & Plan Note (Signed)
STEROID INJECTION  Procedure: Shoulder Intraarticular Steroid Injection Consent: DEL Risks, benefits, and alternative treatments discussed and all questions were answered.  Patient elected to proceed and verbal consent obtained.  Description: Area prepped and draped using   semi-sterile technique. Using a posterolateral approach a mixture of 4cc 0.5% marcaine & 1 cc Kenalog 40 injected in left shoulder joint.  A bandage was then placed over the injection site. Complications:  none

## 2016-09-10 NOTE — Assessment & Plan Note (Signed)
Check TSH and adjust medication when labs come back

## 2016-09-10 NOTE — Assessment & Plan Note (Signed)
Hgb A1C is 6.7.  Stable, continue present medications.

## 2016-09-10 NOTE — Assessment & Plan Note (Signed)
Stable, continue present medications.   

## 2016-09-10 NOTE — Progress Notes (Signed)
BP 125/85 (BP Location: Left Arm, Cuff Size: Large)   Pulse 64   Temp 97.8 F (36.6 C)   Ht 5\' 1"  (1.549 m)   Wt 174 lb 6.4 oz (79.1 kg)   SpO2 97%   BMI 32.95 kg/m    Subjective:    Patient ID: Renee Boyd, female    DOB: [birth year], 66 y.o.   MRN: [id number]  HPI: Renee Boyd is a 66 y.o. female  Chief Complaint  Patient presents with  . Diabetes  . Hyperlipidemia  . Hypertension  . Hypothyroidism  . Labs Only    pt states she is interested in Hep C lab  . Arm Pain    pt states she has been having pain in her left arm, thinks it may be from her stroke.Patient states she cannot pin point when pain started but states it has been between now and October 2017    Diabetes: Using medications without difficulties No hypoglycemic episodes No hyperglycemic episodes Feet problems: none but ankle bothering her Blood Sugars averaging: 137 this AM eye exam within last year Last Hgb A1C: 6.7  Hypertension  Using medications without difficulty Average home BPs Not checking   Using medication without problems or lightheadedness No chest pain with exertion or shortness of breath No Edema  Elevated Cholesterol Using medications without problems No Muscle aches  Diet/Exercise: Not much activity during the winter.    Left arm pain Bothering for 2 months.  No acute injury.  Hurts anytime she does something.   Difficult to sleep at night.    Hypothyroid Pt with low TSH last visit  Relevant past medical, surgical, family and social history reviewed and updated as indicated. Interim medical history since our last visit reviewed. Allergies and medications reviewed and updated. ++- Review of Systems  Per HPI unless specifically indicated above     Objective:    BP 125/85 (BP Location: Left Arm, Cuff Size: Large)   Pulse 64   Temp 97.8 F (36.6 C)   Ht 5\' 1"  (1.549 m)   Wt 174 lb 6.4 oz (79.1 kg)   SpO2 97%   BMI 32.95 kg/m   Wt Readings from Last 3 Encounters:   09/10/16 174 lb 6.4 oz (79.1 kg)  05/13/16 164 lb 12.8 oz (74.8 kg)  04/23/16 165 lb 12.8 oz (75.2 kg)    Physical Exam  Constitutional: She is oriented to person, place, and time. She appears well-developed and well-nourished. No distress.  HENT:  Head: Normocephalic and atraumatic.  Eyes: Conjunctivae and lids are normal. Right eye exhibits no discharge. Left eye exhibits no discharge. No scleral icterus.  Neck: Normal range of motion. Neck supple. No JVD present. Carotid bruit is not present.  Cardiovascular: Normal rate, regular rhythm and normal heart sounds.   Pulmonary/Chest: Effort normal and breath sounds normal.  Abdominal: Normal appearance. There is no splenomegaly or hepatomegaly.  Musculoskeletal: Normal range of motion.  Neurological: She is alert and oriented to person, place, and time.  Skin: Skin is warm, dry and intact. No rash noted. No pallor.  Psychiatric: She has a normal mood and affect. Her behavior is normal. Judgment and thought content normal.      Assessment & Plan:   Problem List Items Addressed This Visit      Unprioritized   Controlled type 2 diabetes mellitus with chronic kidney disease ([place])    Hgb A1C is 6.7.  Stable, continue present medications.  Relevant Orders   Bayer DCA Hb A1c Waived   Dysfunction of left rotator cuff    STEROID INJECTION  Procedure: Shoulder Intraarticular Steroid Injection Consent: DEL Risks, benefits, and alternative treatments discussed and all questions were answered.  Patient elected to proceed and verbal consent obtained.  Description: Area prepped and draped using   semi-sterile technique. Using a posterolateral approach a mixture of 4cc 0.5% marcaine & 1 cc Kenalog 40 injected in left shoulder joint.  A bandage was then placed over the injection site. Complications:  none          Essential hypertension, benign    Stable, continue present medications.        Relevant Medications   amLODipine  (NORVASC) 10 MG tablet   Other Relevant Orders   Comprehensive metabolic panel   Hypothyroidism    Check TSH and adjust medication when labs come back      Relevant Medications   levothyroxine (SYNTHROID, LEVOTHROID) 25 MCG tablet   Other Relevant Orders   TSH    Other Visit Diagnoses    Need for pneumococcal vaccination    -  Primary   Relevant Orders   Pneumococcal conjugate vaccine 13-valent IM (Completed)   Routine general medical examination at a health care facility       Relevant Orders   Hepatitis C antibody       Follow up plan: Return in about 3 months (around 12/08/2016) for Physical in 3 months.

## 2016-09-10 NOTE — Patient Instructions (Signed)
Pneumococcal Conjugate Vaccine (PCV13) What You Need to Know 1. Why get vaccinated? Vaccination can protect both children and adults from pneumococcal disease. Pneumococcal disease is caused by bacteria that can spread from person to person through close contact. It can cause ear infections, and it can also lead to more serious infections of the:  Lungs (pneumonia),  Blood (bacteremia), and  Covering of the brain and spinal cord (meningitis).  Pneumococcal pneumonia is most common among adults. Pneumococcal meningitis can cause deafness and brain damage, and it kills about 1 child in 10 who get it. Anyone can get pneumococcal disease, but children under 2 years of age and adults 65 years and older, people with certain medical conditions, and cigarette smokers are at the highest risk. Before there was a vaccine, the [place] saw:  more than 700 cases of meningitis,  about 13,000 blood infections,  about 5 million ear infections, and  about 200 deaths  in children under 5 each year from pneumococcal disease. Since vaccine became available, severe pneumococcal disease in these children has fallen by 88%. About 18,000 older adults die of pneumococcal disease each year in the [place]. Treatment of pneumococcal infections with penicillin and other drugs is not as effective as it used to be, because some strains of the disease have become resistant to these drugs. This makes prevention of the disease, through vaccination, even more important. 2. PCV13 vaccine Pneumococcal conjugate vaccine (called PCV13) protects against 13 types of pneumococcal bacteria. PCV13 is routinely given to children at 2, 4, 6, and 12-15 months of age. It is also recommended for children and adults 2 to 64 years of age with certain health conditions, and for all adults 65 years of age and older. Your doctor can give you details. 3. Some people should not get this vaccine Anyone who has ever had a  life-threatening allergic reaction to a dose of this vaccine, to an earlier pneumococcal vaccine called PCV7, or to any vaccine containing diphtheria toxoid (for example, DTaP), should not get PCV13. Anyone with a severe allergy to any component of PCV13 should not get the vaccine. Tell your doctor if the person being vaccinated has any severe allergies. If the person scheduled for vaccination is not feeling well, your healthcare provider might decide to reschedule the shot on another day. 4. Risks of a vaccine reaction With any medicine, including vaccines, there is a chance of reactions. These are usually mild and go away on their own, but serious reactions are also possible. Problems reported following PCV13 varied by age and dose in the series. The most common problems reported among children were:  About half became drowsy after the shot, had a temporary loss of appetite, or had redness or tenderness where the shot was given.  About 1 out of 3 had swelling where the shot was given.  About 1 out of 3 had a mild fever, and about 1 in 20 had a fever over 102.2F.  Up to about 8 out of 10 became fussy or irritable.  Adults have reported pain, redness, and swelling where the shot was given; also mild fever, fatigue, headache, chills, or muscle pain. Young children who get PCV13 along with inactivated flu vaccine at the same time may be at increased risk for seizures caused by fever. Ask your doctor for more information. Problems that could happen after any vaccine:  People sometimes faint after a medical procedure, including vaccination. Sitting or lying down for about 15 minutes can help prevent   fainting, and injuries caused by a fall. Tell your doctor if you feel dizzy, or have vision changes or ringing in the ears.  Some older children and adults get severe pain in the shoulder and have difficulty moving the arm where a shot was given. This happens very rarely.  Any medication can cause a  severe allergic reaction. Such reactions from a vaccine are very rare, estimated at about 1 in a million doses, and would happen within a few minutes to a few hours after the vaccination. As with any medicine, there is a very small chance of a vaccine causing a serious injury or death. The safety of vaccines is always being monitored. For more information, visit: www.cdc.gov/vaccinesafety/ 5. What if there is a serious reaction? What should I look for? Look for anything that concerns you, such as signs of a severe allergic reaction, very high fever, or unusual behavior. Signs of a severe allergic reaction can include hives, swelling of the face and throat, difficulty breathing, a fast heartbeat, dizziness, and weakness-usually within a few minutes to a few hours after the vaccination. What should I do?  If you think it is a severe allergic reaction or other emergency that can't wait, call 9-1-1 or get the person to the nearest hospital. Otherwise, call your doctor.  Reactions should be reported to the Vaccine Adverse Event Reporting System (VAERS). Your doctor should file this report, or you can do it yourself through the VAERS web site at www.vaers.hhs.gov, or by calling 1-800-822-7967. ? VAERS does not give medical advice. 6. The National Vaccine Injury Compensation Program The National Vaccine Injury Compensation Program (VICP) is a federal program that was created to compensate people who may have been injured by certain vaccines. Persons who believe they may have been injured by a vaccine can learn about the program and about filing a claim by calling 1-800-338-2382 or visiting the VICP website at www.hrsa.gov/vaccinecompensation. There is a time limit to file a claim for compensation. 7. How can I learn more?  Ask your healthcare provider. He or she can give you the vaccine package insert or suggest other sources of information.  Call your local or state health department.  Contact the  Centers for Disease Control and Prevention (CDC): ? Call 1-800-232-4636 (1-800-CDC-INFO) or ? Visit CDC's website at www.cdc.gov/vaccines Vaccine Information Statement, PCV13 Vaccine (06/09/2014) This information is not intended to replace advice given to you by your health care provider. Make sure you discuss any questions you have with your health care provider. Document Released: 05/19/2006 Document Revised: 04/11/2016 Document Reviewed: 04/11/2016 Elsevier Interactive Patient Education  2017 Elsevier Inc.  

## 2016-09-11 ENCOUNTER — Other Ambulatory Visit: Payer: Self-pay | Admitting: Unknown Physician Specialty

## 2016-09-11 ENCOUNTER — Telehealth: Payer: Self-pay | Admitting: Unknown Physician Specialty

## 2016-09-11 MED ORDER — LEVOTHYROXINE SODIUM 25 MCG PO TABS: 25.0000 ug | ORAL_TABLET | Freq: Every day | ORAL | 1 refills | Status: DC

## 2016-09-11 NOTE — Telephone Encounter (Signed)
Patient said someone had tried to reach her from the office and she was sorry she did not have the phone with her and could not get the call.  I told her I would let Malachy Mood know .   Thanks  Santiago Glad

## 2016-09-11 NOTE — Telephone Encounter (Signed)
Will call patient back, documentation in result note.

## 2016-09-12 LAB — COMPREHENSIVE METABOLIC PANEL
ALBUMIN: 4.8 g/dL (ref 3.6–4.8)
ALT: 8 IU/L (ref 0–32)
AST: 13 IU/L (ref 0–40)
Albumin/Globulin Ratio: 1.8 (ref 1.2–2.2)
Alkaline Phosphatase: 86 IU/L (ref 39–117)
BILIRUBIN TOTAL: 0.2 mg/dL (ref 0.0–1.2)
BUN / CREAT RATIO: 14 (ref 12–28)
BUN: 25 mg/dL (ref 8–27)
CHLORIDE: 100 mmol/L (ref 96–106)
CO2: 20 mmol/L (ref 18–29)
Calcium: 10.4 mg/dL — ABNORMAL HIGH (ref 8.7–10.3)
Creatinine, Ser: 1.73 mg/dL — ABNORMAL HIGH (ref 0.57–1.00)
GFR calc Af Amer: 35 mL/min/{1.73_m2} — ABNORMAL LOW (ref >59)
GFR calc non Af Amer: 30 mL/min/{1.73_m2} — ABNORMAL LOW (ref >59)
GLOBULIN, TOTAL: 2.7 g/dL (ref 1.5–4.5)
GLUCOSE: 143 mg/dL — AB (ref 65–99)
Potassium: 4.9 mmol/L (ref 3.5–5.2)
SODIUM: 145 mmol/L — AB (ref 134–144)
Total Protein: 7.5 g/dL (ref 6.0–8.5)

## 2016-09-12 LAB — TSH: TSH: 0.602 u[IU]/mL (ref 0.450–4.500)

## 2016-09-12 LAB — HEPATITIS C ANTIBODY

## 2016-09-13 ENCOUNTER — Encounter: Payer: Self-pay | Admitting: Unknown Physician Specialty

## 2016-09-16 DIAGNOSIS — M25552 Pain in left hip: Secondary | ICD-10-CM | POA: Diagnosis not present

## 2016-09-16 DIAGNOSIS — M2142 Flat foot [pes planus] (acquired), left foot: Secondary | ICD-10-CM | POA: Diagnosis not present

## 2016-09-23 DIAGNOSIS — R809 Proteinuria, unspecified: Secondary | ICD-10-CM | POA: Diagnosis not present

## 2016-09-23 DIAGNOSIS — E1122 Type 2 diabetes mellitus with diabetic chronic kidney disease: Secondary | ICD-10-CM | POA: Diagnosis not present

## 2016-09-23 DIAGNOSIS — N183 Chronic kidney disease, stage 3 (moderate): Secondary | ICD-10-CM | POA: Diagnosis not present

## 2016-09-23 DIAGNOSIS — I129 Hypertensive chronic kidney disease with stage 1 through stage 4 chronic kidney disease, or unspecified chronic kidney disease: Secondary | ICD-10-CM | POA: Diagnosis not present

## 2016-09-27 ENCOUNTER — Ambulatory Visit (INDEPENDENT_AMBULATORY_CARE_PROVIDER_SITE_OTHER): Payer: Medicare Other | Admitting: Podiatry

## 2016-09-27 ENCOUNTER — Encounter: Payer: Self-pay | Admitting: Podiatry

## 2016-09-27 ENCOUNTER — Ambulatory Visit (INDEPENDENT_AMBULATORY_CARE_PROVIDER_SITE_OTHER): Payer: Medicare Other

## 2016-09-27 VITALS — BP 146/76 | HR 64 | Resp 16

## 2016-09-27 DIAGNOSIS — Z8781 Personal history of (healed) traumatic fracture: Secondary | ICD-10-CM | POA: Diagnosis not present

## 2016-09-27 DIAGNOSIS — M659 Synovitis and tenosynovitis, unspecified: Secondary | ICD-10-CM | POA: Diagnosis not present

## 2016-09-27 DIAGNOSIS — M779 Enthesopathy, unspecified: Secondary | ICD-10-CM | POA: Diagnosis not present

## 2016-09-27 DIAGNOSIS — M25572 Pain in left ankle and joints of left foot: Secondary | ICD-10-CM | POA: Diagnosis not present

## 2016-09-27 DIAGNOSIS — M7752 Other enthesopathy of left foot: Secondary | ICD-10-CM

## 2016-10-05 MED ORDER — BETAMETHASONE SOD PHOS & ACET 6 (3-3) MG/ML IJ SUSP: 3.0000 mg | Freq: Once | INTRAMUSCULAR | Status: DC

## 2016-10-05 NOTE — Progress Notes (Signed)
   Subjective:  Patient presents today for pain and tenderness to the bilateral ankles. Patient relates significant pain and tenderness when walking.  Patient states that she does have a history of an ankle fracture to the right lower extremity which occurred in 1998 in a slip and fall injury. Patient presents for further treatment and evaluation.   Objective / Physical Exam:  General:  The patient is alert and oriented x3 in no acute distress. Dermatology:  Hyperkeratotic callus tissue noted to the plantar aspect weightbearing surface of the left heel. Skin is warm, dry and supple bilateral lower extremities. Negative for open lesions or macerations. Vascular:  Palpable pedal pulses bilaterally. No edema or erythema noted. Capillary refill within normal limits. Neurological:  Epicritic and protective threshold grossly intact bilaterally.  Musculoskeletal Exam:  Pain on palpation to the anterior lateral medial aspects of the patient's bilateral ankles. Mild edema noted.  Range of motion within normal limits to all pedal and ankle joints bilateral. Muscle strength 5/5 in all groups bilateral.   Radiographic Exam:  Normal osseous mineralization. Joint spaces preserved. No fracture/dislocation/boney destruction.    Assessment: #1 pain in bilateral ankles  #2 synovitis bilateral ankles #3 capsulitis bilateral ankles   Plan of Care:  #1 Patient was evaluated. #2 injection of 0.5 mL Celestone Soluspan injected in the patient's left ankle joint. #3 recommend Revitaderm cream for callused heels #4 return to clinic in 3 months   Edrick Kins, DPM [facility]  Dr. Edrick Kins, [place]                                        [place] [facility] [place], [place] [address]                Office [phone]  Fax [phone]

## 2016-10-08 ENCOUNTER — Other Ambulatory Visit: Payer: Self-pay | Admitting: Family Medicine

## 2016-10-08 NOTE — Telephone Encounter (Signed)
Your patient 

## 2016-10-22 ENCOUNTER — Ambulatory Visit (INDEPENDENT_AMBULATORY_CARE_PROVIDER_SITE_OTHER): Payer: Medicare Other | Admitting: *Deleted

## 2016-10-22 DIAGNOSIS — I442 Atrioventricular block, complete: Secondary | ICD-10-CM

## 2016-10-22 NOTE — Progress Notes (Signed)
Remote pacemaker transmission.   

## 2016-10-23 ENCOUNTER — Encounter: Payer: Self-pay | Admitting: Cardiology

## 2016-10-23 ENCOUNTER — Other Ambulatory Visit: Payer: Self-pay | Admitting: Unknown Physician Specialty

## 2016-10-24 ENCOUNTER — Other Ambulatory Visit: Payer: Self-pay | Admitting: Unknown Physician Specialty

## 2016-10-24 LAB — CUP PACEART REMOTE DEVICE CHECK
Brady Statistic AP VS Percent: 0.1 %
Brady Statistic AS VS Percent: 0.2 %
Date Time Interrogation Session: 20180322113854
Implantable Lead Implant Date: 20141217
Implantable Lead Location: 753859
Implantable Lead Location: 753860
Implantable Lead Model: 5076
Lead Channel Impedance Value: 646 Ohm
Lead Channel Pacing Threshold Pulse Width: 0.4 ms
Lead Channel Sensing Intrinsic Amplitude: 20 mV
Lead Channel Sensing Intrinsic Amplitude: 4.8 mV
MDC IDC LEAD IMPLANT DT: 20141217
MDC IDC MSMT LEADCHNL RA IMPEDANCE VALUE: 437 Ohm
MDC IDC MSMT LEADCHNL RV PACING THRESHOLD AMPLITUDE: 0.5 V
MDC IDC PG IMPLANT DT: 20141217
MDC IDC STAT BRADY AP VP PERCENT: 0.1 %
MDC IDC STAT BRADY AS VP PERCENT: 99.7 %

## 2016-10-24 NOTE — Telephone Encounter (Signed)
This was done yesterday and faxed to pharmacy this morning. Please refuse to close encounter.

## 2016-11-11 ENCOUNTER — Other Ambulatory Visit: Payer: Self-pay | Admitting: Unknown Physician Specialty

## 2016-11-11 ENCOUNTER — Ambulatory Visit (INDEPENDENT_AMBULATORY_CARE_PROVIDER_SITE_OTHER): Payer: Medicare Other | Admitting: Unknown Physician Specialty

## 2016-11-11 ENCOUNTER — Encounter: Payer: Self-pay | Admitting: Unknown Physician Specialty

## 2016-11-11 VITALS — BP 144/81 | HR 92 | Temp 97.6°F | Wt 178.8 lb

## 2016-11-11 DIAGNOSIS — M7989 Other specified soft tissue disorders: Secondary | ICD-10-CM

## 2016-11-11 MED ORDER — METHYLPREDNISOLONE 4 MG PO TBPK: ORAL_TABLET | ORAL | 0 refills | Status: DC

## 2016-11-11 NOTE — Progress Notes (Signed)
   BP (!) 144/81 (BP Location: Left Arm, Patient Position: Sitting, Cuff Size: Large)   Pulse 92   Temp 97.6 F (36.4 C)   Wt 178 lb 12.8 oz (81.1 kg)   SpO2 96%   BMI 33.78 kg/m    Subjective:    Patient ID: Renee Boyd, female    DOB: 1951-06-12, 66 y.o.   MRN: [id number]  HPI: Renee Boyd is a 66 y.o. female  Chief Complaint  Patient presents with  . Hand Pain    pt states her left hand has been hurting since 10/27/16, states she is not sure what she done to it    Pt states her left middle finger is bothering her for about 2 weeks.  No trauma, just woke up one day with pain.  States it is stiff and unable to fully flex it.  States I "just bothers everything."  Sometimes it is very painful.  Left shoulder also painful but seems to be getting better.  No history of gout but uric acid levels are very high.    Relevant past medical, surgical, family and social history reviewed and updated as indicated. Interim medical history since our last visit reviewed. Allergies and medications reviewed and updated.  Review of Systems  Per HPI unless specifically indicated above     Objective:    BP (!) 144/81 (BP Location: Left Arm, Patient Position: Sitting, Cuff Size: Large)   Pulse 92   Temp 97.6 F (36.4 C)   Wt 178 lb 12.8 oz (81.1 kg)   SpO2 96%   BMI 33.78 kg/m   Wt Readings from Last 3 Encounters:  11/11/16 178 lb 12.8 oz (81.1 kg)  09/10/16 174 lb 6.4 oz (79.1 kg)  05/13/16 164 lb 12.8 oz (74.8 kg)    Physical Exam  Constitutional: She is oriented to person, place, and time. She appears well-developed and well-nourished. No distress.  HENT:  Head: Normocephalic and atraumatic.  Eyes: Conjunctivae and lids are normal. Right eye exhibits no discharge. Left eye exhibits no discharge. No scleral icterus.  Neck: Normal range of motion. Neck supple. No JVD present. Carotid bruit is not present.  Cardiovascular: Normal rate, regular rhythm and normal heart sounds.     Pulmonary/Chest: Effort normal and breath sounds normal.  Abdominal: Normal appearance. There is no splenomegaly or hepatomegaly.  Musculoskeletal: Normal range of motion.  Swollen left middle and index finger.    Neurological: She is alert and oriented to person, place, and time.  Skin: Skin is warm, dry and intact. No rash noted. No pallor.  Psychiatric: She has a normal mood and affect. Her behavior is normal. Judgment and thought content normal.     Assessment & Plan:   Problem List Items Addressed This Visit    None    Visit Diagnoses    Swelling of left hand    -  Primary   new problem.  ?gout/RA.  Rx for Medrol dose pack.  Discussed sugar will worsen on this.  Check Uric acid and consider Allopurinol (renal dosing)   Relevant Orders   Uric acid   Rheumatoid Factor       Follow up plan: Return if symptoms worsen or fail to improve.

## 2016-11-12 LAB — URIC ACID: URIC ACID: 9.7 mg/dL — AB (ref 2.5–7.1)

## 2016-11-12 LAB — RHEUMATOID FACTOR: Rhuematoid fact SerPl-aCnc: <10 IU/mL (ref 0.0–13.9)

## 2016-11-21 ENCOUNTER — Other Ambulatory Visit: Payer: Self-pay | Admitting: Unknown Physician Specialty

## 2016-11-22 ENCOUNTER — Other Ambulatory Visit: Payer: Self-pay | Admitting: Unknown Physician Specialty

## 2016-11-22 NOTE — Telephone Encounter (Signed)
This was just refilled this morning.

## 2016-11-25 ENCOUNTER — Other Ambulatory Visit: Payer: Self-pay | Admitting: Unknown Physician Specialty

## 2016-11-25 NOTE — Telephone Encounter (Signed)
This was just done on Friday, 11/22/16.

## 2016-12-10 ENCOUNTER — Ambulatory Visit (INDEPENDENT_AMBULATORY_CARE_PROVIDER_SITE_OTHER): Payer: Medicare Other | Admitting: Unknown Physician Specialty

## 2016-12-10 ENCOUNTER — Encounter: Payer: Self-pay | Admitting: Unknown Physician Specialty

## 2016-12-10 VITALS — BP 137/81 | HR 62 | Temp 97.4°F | Ht 59.7 in | Wt 180.5 lb

## 2016-12-10 DIAGNOSIS — Z7189 Other specified counseling: Secondary | ICD-10-CM

## 2016-12-10 DIAGNOSIS — E790 Hyperuricemia without signs of inflammatory arthritis and tophaceous disease: Secondary | ICD-10-CM | POA: Diagnosis not present

## 2016-12-10 DIAGNOSIS — Z0000 Encounter for general adult medical examination without abnormal findings: Secondary | ICD-10-CM

## 2016-12-10 DIAGNOSIS — E2839 Other primary ovarian failure: Secondary | ICD-10-CM

## 2016-12-10 MED ORDER — ALLOPURINOL 100 MG PO TABS: 100.0000 mg | ORAL_TABLET | Freq: Every day | ORAL | 6 refills | Status: DC

## 2016-12-10 NOTE — Assessment & Plan Note (Signed)
Start Allopurinol of 100 mgs.  Needs renal dosing.  Will hold at that dose

## 2016-12-10 NOTE — Progress Notes (Signed)
BP 137/81 (BP Location: Right Arm, Cuff Size: Large)   Pulse 62   Temp 97.4 F (36.3 C)   Ht 4' 11.7" (1.516 m)   Wt 180 lb 8 oz (81.9 kg)   SpO2 93%   BMI 35.61 kg/m    Subjective:    Patient ID: Renee Boyd, female    DOB: 10-30-1950, 66 y.o.   MRN: [id number]  HPI: Renee Boyd is a 66 y.o. female  Chief Complaint  Patient presents with  . Medicare Wellness   Functional Status Survey: Is the patient deaf or have difficulty hearing?: No Does the patient have difficulty seeing, even when wearing glasses/contacts?: No Does the patient have difficulty concentrating, remembering, or making decisions?: No Does the patient have difficulty walking or climbing stairs?: No Does the patient have difficulty dressing or bathing?: Yes (pain) Does the patient have difficulty doing errands alone such as visiting a doctor's office or shopping?: No Fall Risk  12/10/2016 04/09/2016  Falls in the past year? Yes Yes  Number falls in past yr: - 2 or more  Injury with Fall? No No   Depression screen PHQ 2/9 12/10/2016  Decreased Interest 0  Down, Depressed, Hopeless 0  PHQ - 2 Score 0  Altered sleeping 0  Tired, decreased energy 0  Change in appetite 3  Feeling bad or failure about yourself  1  Trouble concentrating 0  Moving slowly or fidgety/restless 0  Suicidal thoughts 0  PHQ-9 Score 4   Last visit noted elevated Uric Acid.  Suspect she had an acute gout flare last visit.    Relevant past medical, surgical, family and social history reviewed and updated as indicated. Interim medical history since our last visit reviewed. Allergies and medications reviewed and updated.  Review of Systems  Constitutional: Negative.   HENT: Negative.   Eyes: Negative.   Respiratory: Negative.   Cardiovascular: Negative.   Gastrointestinal: Negative.   Endocrine: Negative.   Genitourinary: Negative.   Musculoskeletal:       Generalized arthralgias  Psychiatric/Behavioral: Negative.      Per HPI unless specifically indicated above     Objective:    BP 137/81 (BP Location: Right Arm, Cuff Size: Large)   Pulse 62   Temp 97.4 F (36.3 C)   Ht 4' 11.7" (1.516 m)   Wt 180 lb 8 oz (81.9 kg)   SpO2 93%   BMI 35.61 kg/m   Wt Readings from Last 3 Encounters:  12/10/16 180 lb 8 oz (81.9 kg)  11/11/16 178 lb 12.8 oz (81.1 kg)  09/10/16 174 lb 6.4 oz (79.1 kg)    Physical Exam  Constitutional: She is oriented to person, place, and time. She appears well-developed and well-nourished.  HENT:  Head: Normocephalic and atraumatic.  Eyes: Pupils are equal, round, and reactive to light. Right eye exhibits no discharge. Left eye exhibits no discharge. No scleral icterus.  Neck: Normal range of motion. Neck supple. Carotid bruit is not present. No thyromegaly present.  Cardiovascular: Normal rate, regular rhythm and normal heart sounds.  Exam reveals no gallop and no friction rub.   No murmur heard. Pulmonary/Chest: Effort normal and breath sounds normal. No respiratory distress. She has no wheezes. She has no rales.  Abdominal: Soft. Bowel sounds are normal. There is no tenderness. There is no rebound.  Genitourinary: No breast swelling, tenderness or discharge.  Musculoskeletal: Normal range of motion.  Lymphadenopathy:    She has no cervical adenopathy.  Neurological: She is  alert and oriented to person, place, and time.  Skin: Skin is warm, dry and intact. No rash noted.  Psychiatric: She has a normal mood and affect. Her speech is normal and behavior is normal. Judgment and thought content normal. Cognition and memory are normal.    Results for orders placed or performed in visit on 11/11/16  Uric acid  Result Value Ref Range   Uric Acid 9.7 (H) 2.5 - 7.1 mg/dL  Rheumatoid Factor  Result Value Ref Range   Rhuematoid fact SerPl-aCnc <10.0 0.0 - 13.9 IU/mL      Assessment & Plan:   Problem List Items Addressed This Visit      Unprioritized   Advanced care  planning/counseling discussion    A voluntary discussion about advance care planning including the explanation and discussion of advance directives was extensively discussed  with the patient.  Explanation about the health care proxy and Living will was reviewed and packet with forms with explanation of how to fill them out was given.  During this discussion, the patient was able to identify a health care proxy as her son and plans and may fill out the paperwork required.  Patient was offered a separate [place] visit for further assistance with forms.  She is considering a DNR form.         Hyperuricemia    Start Allopurinol of 100 mgs.  Needs renal dosing.  Will hold at that dose       Other Visit Diagnoses    Annual physical exam    -  Primary   Ovarian failure       Relevant Orders   DG Bone Density      Chronic disease management visit in 3 months  Follow up plan: Return in about 3 months (around 03/12/2017).

## 2016-12-10 NOTE — Assessment & Plan Note (Addendum)
A voluntary discussion about advance care planning including the explanation and discussion of advance directives was extensively discussed  with the patient.  Explanation about the health care proxy and Living will was reviewed and packet with forms with explanation of how to fill them out was given.  During this discussion, the patient was able to identify a health care proxy as her son and plans and may fill out the paperwork required.  Patient was offered a separate [place] visit for further assistance with forms.  She is considering a DNR form.

## 2016-12-10 NOTE — Patient Instructions (Signed)
Please do call to schedule your bone density; the number to schedule one at either [facility] or [facility] is [phone]   

## 2016-12-16 ENCOUNTER — Other Ambulatory Visit: Payer: Self-pay | Admitting: Unknown Physician Specialty

## 2016-12-17 ENCOUNTER — Other Ambulatory Visit: Payer: Self-pay | Admitting: Unknown Physician Specialty

## 2016-12-27 ENCOUNTER — Ambulatory Visit (INDEPENDENT_AMBULATORY_CARE_PROVIDER_SITE_OTHER): Payer: Medicare Other | Admitting: Podiatry

## 2016-12-27 DIAGNOSIS — M659 Synovitis and tenosynovitis, unspecified: Secondary | ICD-10-CM

## 2016-12-30 NOTE — Progress Notes (Signed)
   Subjective:  Patient presents today for follow-up evaluation of pain and tenderness to the bilateral ankles, left worse than right. She reports her pain is unchanged at this time. She states the injections helped for 2-3 months.   Objective / Physical Exam:  General:  The patient is alert and oriented x3 in no acute distress. Dermatology:  Hyperkeratotic callus tissue noted to the plantar aspect weightbearing surface of the left heel. Skin is warm, dry and supple bilateral lower extremities. Negative for open lesions or macerations. Vascular:  Palpable pedal pulses bilaterally. No edema or erythema noted. Capillary refill within normal limits. Neurological:  Epicritic and protective threshold grossly intact bilaterally.  Musculoskeletal Exam:  Pain on palpation to the anterior lateral medial aspects of the patient's bilateral ankles. Mild edema noted.  Range of motion within normal limits to all pedal and ankle joints bilateral. Muscle strength 5/5 in all groups bilateral.   Assessment: #1 pain in bilateral ankles  #2 synovitis bilateral ankles   Plan of Care:  #1 Patient was evaluated. #2 injection of 0.5 mL Celestone Soluspan injected in the patient's ankle joints bilaterally. #3 compression anklets dispensed bilaterally. #4 return to clinic when necessary.   Edrick Kins, DPM [facility]  Dr. Edrick Kins, [place]                                        [place], [place] [address]                Office [phone]  Fax [phone]

## 2017-01-02 MED ORDER — BETAMETHASONE SOD PHOS & ACET 6 (3-3) MG/ML IJ SUSP: 3.0000 mg | Freq: Once | INTRAMUSCULAR | Status: DC

## 2017-01-07 ENCOUNTER — Other Ambulatory Visit: Payer: Self-pay | Admitting: Unknown Physician Specialty

## 2017-01-09 DIAGNOSIS — H40003 Preglaucoma, unspecified, bilateral: Secondary | ICD-10-CM | POA: Diagnosis not present

## 2017-01-09 DIAGNOSIS — H43813 Vitreous degeneration, bilateral: Secondary | ICD-10-CM | POA: Diagnosis not present

## 2017-01-09 LAB — HM DIABETES EYE EXAM

## 2017-01-13 ENCOUNTER — Ambulatory Visit
Admission: RE | Admit: 2017-01-13 | Discharge: 2017-01-13 | Disposition: A | Discharge status: Home / Self Care | Payer: Medicare Other | Source: Ambulatory Visit | Attending: Unknown Physician Specialty | Admitting: Unknown Physician Specialty

## 2017-01-13 DIAGNOSIS — M810 Age-related osteoporosis without current pathological fracture: Secondary | ICD-10-CM | POA: Diagnosis not present

## 2017-01-13 DIAGNOSIS — E2839 Other primary ovarian failure: Secondary | ICD-10-CM | POA: Insufficient documentation

## 2017-01-14 ENCOUNTER — Telehealth: Payer: Self-pay | Admitting: Unknown Physician Specialty

## 2017-01-14 MED ORDER — IBANDRONATE SODIUM 150 MG PO TABS: 150.0000 mg | ORAL_TABLET | ORAL | 4 refills | Status: DC

## 2017-01-14 NOTE — Progress Notes (Signed)
Patient notified of results by phone.

## 2017-01-14 NOTE — Telephone Encounter (Signed)
Discussed with pt about Osteoporosis.  Will rx Boniva.to take monthly.

## 2017-01-20 DIAGNOSIS — E1122 Type 2 diabetes mellitus with diabetic chronic kidney disease: Secondary | ICD-10-CM | POA: Diagnosis not present

## 2017-01-20 DIAGNOSIS — R809 Proteinuria, unspecified: Secondary | ICD-10-CM | POA: Diagnosis not present

## 2017-01-20 DIAGNOSIS — M109 Gout, unspecified: Secondary | ICD-10-CM | POA: Diagnosis not present

## 2017-01-20 DIAGNOSIS — N183 Chronic kidney disease, stage 3 (moderate): Secondary | ICD-10-CM | POA: Diagnosis not present

## 2017-01-20 DIAGNOSIS — I129 Hypertensive chronic kidney disease with stage 1 through stage 4 chronic kidney disease, or unspecified chronic kidney disease: Secondary | ICD-10-CM | POA: Diagnosis not present

## 2017-01-21 ENCOUNTER — Ambulatory Visit (INDEPENDENT_AMBULATORY_CARE_PROVIDER_SITE_OTHER): Payer: Medicare Other | Admitting: *Deleted

## 2017-01-21 DIAGNOSIS — I442 Atrioventricular block, complete: Secondary | ICD-10-CM | POA: Diagnosis not present

## 2017-01-21 NOTE — Progress Notes (Signed)
Remote pacemaker transmission.   

## 2017-01-22 LAB — CUP PACEART REMOTE DEVICE CHECK
Brady Statistic AP VP Percent: 32.03 %
Brady Statistic AP VS Percent: 0.01 %
Brady Statistic AS VP Percent: 67.49 %
Brady Statistic AS VS Percent: 0.47 %
Implantable Lead Implant Date: 20141217
Implantable Lead Location: 753860
Implantable Lead Model: 5076
Lead Channel Impedance Value: 418 Ohm
Lead Channel Impedance Value: 437 Ohm
Lead Channel Impedance Value: 589 Ohm
Lead Channel Impedance Value: 589 Ohm
Lead Channel Pacing Threshold Amplitude: 0.5 V
Lead Channel Pacing Threshold Pulse Width: 0.4 ms
Lead Channel Sensing Intrinsic Amplitude: 22.125 mV
Lead Channel Sensing Intrinsic Amplitude: 4.375 mV
Lead Channel Sensing Intrinsic Amplitude: 4.375 mV
MDC IDC LEAD IMPLANT DT: 20141217
MDC IDC LEAD LOCATION: 753859
MDC IDC MSMT BATTERY REMAINING LONGEVITY: 59 mo
MDC IDC MSMT BATTERY VOLTAGE: 2.99 V
MDC IDC MSMT LEADCHNL RV SENSING INTR AMPL: 22.125 mV
MDC IDC PG IMPLANT DT: 20141217
MDC IDC SESS DTM: 20180619130414
MDC IDC SET LEADCHNL RA PACING AMPLITUDE: 2 V
MDC IDC SET LEADCHNL RV PACING AMPLITUDE: 2.5 V
MDC IDC SET LEADCHNL RV PACING PULSEWIDTH: 0.4 ms
MDC IDC SET LEADCHNL RV SENSING SENSITIVITY: 0.9 mV
MDC IDC STAT BRADY RA PERCENT PACED: 31.76 %
MDC IDC STAT BRADY RV PERCENT PACED: 98.97 %

## 2017-01-24 ENCOUNTER — Encounter: Payer: Self-pay | Admitting: Cardiology

## 2017-02-17 ENCOUNTER — Other Ambulatory Visit: Payer: Self-pay | Admitting: Unknown Physician Specialty

## 2017-02-18 ENCOUNTER — Other Ambulatory Visit: Payer: Self-pay | Admitting: Unknown Physician Specialty

## 2017-02-19 ENCOUNTER — Telehealth: Payer: Self-pay | Admitting: Unknown Physician Specialty

## 2017-02-19 NOTE — Telephone Encounter (Signed)
Patient would like a refill for her medication temazepam 30 mg caps. Patient was told she needed to come in for an appointment in order for refill. Patient has an appointment scheduled for 03/12/2017; every three months for her follow-up.  Patient would like to be notified about what she needs to do and the medication. Patient almost out of medication.   Please Advise.  Thank you

## 2017-02-19 NOTE — Telephone Encounter (Signed)
Prescription was written and ready to be faxed to the pharmacy. Faxed prescription to [facility] and patient was notified.

## 2017-03-12 ENCOUNTER — Ambulatory Visit (INDEPENDENT_AMBULATORY_CARE_PROVIDER_SITE_OTHER): Payer: Medicare Other | Admitting: Unknown Physician Specialty

## 2017-03-12 ENCOUNTER — Encounter: Payer: Self-pay | Admitting: Unknown Physician Specialty

## 2017-03-12 ENCOUNTER — Other Ambulatory Visit: Payer: Self-pay | Admitting: Unknown Physician Specialty

## 2017-03-12 DIAGNOSIS — E1122 Type 2 diabetes mellitus with diabetic chronic kidney disease: Secondary | ICD-10-CM

## 2017-03-12 DIAGNOSIS — I10 Essential (primary) hypertension: Secondary | ICD-10-CM

## 2017-03-12 DIAGNOSIS — M67912 Unspecified disorder of synovium and tendon, left shoulder: Secondary | ICD-10-CM

## 2017-03-12 DIAGNOSIS — N183 Chronic kidney disease, stage 3 unspecified: Secondary | ICD-10-CM

## 2017-03-12 DIAGNOSIS — E119 Type 2 diabetes mellitus without complications: Secondary | ICD-10-CM | POA: Diagnosis not present

## 2017-03-12 DIAGNOSIS — Z7189 Other specified counseling: Secondary | ICD-10-CM

## 2017-03-13 NOTE — Progress Notes (Signed)
BP 116/75   Pulse 97   Temp 97.7 F (36.5 C)   Wt 177 lb (80.3 kg)   SpO2 95%   BMI 34.92 kg/m    Subjective:    Patient ID: Renee Boyd, female    DOB: [birth year], 66 y.o.   MRN: [id number]  HPI: Renee Boyd is a 66 y.o. female  Chief Complaint  Patient presents with  . Diabetes  . Hyperlipidemia  . Hypertension  . Shoulder Pain    pt states she is still having pain in her left shoulder, wants to know if she can take something else other than tramadol    Diabetes: Using medications without difficulties No hypoglycemic episodes No hyperglycemic episodes Feet problems:none Blood Sugars averaging: 130-140 eye exam within last year Last Hgb A1C:7.4  Hypertension  Using medications without difficulty Average home BPs not checking  Using medication without problems or lightheadedness No chest pain with exertion or shortness of breath No Edema  Elevated Cholesterol Using medications without problems No Muscle aches  Diet: trying to eat more fruit and vegetables  Exercise: basically sendentary  Shoulder pain Pain left shoulder.  Pt states it is better, but interested in another injection.  Relevant past medical, surgical, family and social history reviewed and updated as indicated. Interim medical history since our last visit reviewed. Allergies and medications reviewed and updated.  Review of Systems  Per HPI unless specifically indicated above     Objective:    BP 116/75   Pulse 97   Temp 97.7 F (36.5 C)   Wt 177 lb (80.3 kg)   SpO2 95%   BMI 34.92 kg/m   Wt Readings from Last 3 Encounters:  03/12/17 177 lb (80.3 kg)  12/10/16 180 lb 8 oz (81.9 kg)  11/11/16 178 lb 12.8 oz (81.1 kg)    Physical Exam  Constitutional: She is oriented to person, place, and time. She appears well-developed and well-nourished. No distress.  HENT:  Head: Normocephalic and atraumatic.  Eyes: Conjunctivae and lids are normal. Right eye exhibits no discharge. Left  eye exhibits no discharge. No scleral icterus.  Neck: Normal range of motion. Neck supple. No JVD present. Carotid bruit is not present.  Cardiovascular: Normal rate, regular rhythm and normal heart sounds.   Pulmonary/Chest: Effort normal and breath sounds normal.  Abdominal: Normal appearance. There is no splenomegaly or hepatomegaly.  Musculoskeletal:       Left shoulder: She exhibits decreased range of motion, tenderness, pain and decreased strength. She exhibits no swelling and no deformity.  Positive impingement signs  Neurological: She is alert and oriented to person, place, and time.  Skin: Skin is warm, dry and intact. No rash noted. No pallor.  Psychiatric: She has a normal mood and affect. Her behavior is normal. Judgment and thought content normal.     STEROID INJECTION Procedure: Shoulder Intraarticular Steroid Injection Consent: DEL Risks, benefits, and alternative treatments discussed and all questions were answered.  Patient elected to proceed and verbal consent obtained.  Description: Area prepped and draped using   semi-sterile technique. Using a posterolateral approach a mixture of 9cc 0.5% marcaine & 1 cc Kenalog 40 injected in shoulder joint.  A bandage was then placed over the injection site. Complications:  none   Assessment & Plan:   Problem List Items Addressed This Visit      Unprioritized   Advanced care planning/counseling discussion    Pt had ACP documents filled out.  Reviewed forms and answered questions.  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  CMP pending      Dysfunction of left rotator cuff    Injection given.  Will follow if continued pain      Essential hypertension, benign    Stable, continue present medications.            Follow up plan: Return in about 3 months (around 06/12/2017).

## 2017-03-13 NOTE — Assessment & Plan Note (Signed)
Pt had ACP documents filled out.  Reviewed forms and answered questions.

## 2017-03-13 NOTE — Assessment & Plan Note (Signed)
Stable, continue present medications.   

## 2017-03-13 NOTE — Assessment & Plan Note (Addendum)
Hgb A1C was 7.1%  Not to goal but improved.  CMP pending

## 2017-03-13 NOTE — Assessment & Plan Note (Signed)
Injection given.  Will follow if continued pain

## 2017-03-17 ENCOUNTER — Other Ambulatory Visit: Payer: Self-pay

## 2017-03-17 ENCOUNTER — Other Ambulatory Visit: Payer: Self-pay | Admitting: Unknown Physician Specialty

## 2017-03-17 MED ORDER — AMLODIPINE BESYLATE 10 MG PO TABS: 10.0000 mg | ORAL_TABLET | Freq: Every day | ORAL | 3 refills | Status: DC

## 2017-03-17 MED ORDER — AMLODIPINE BESYLATE 10 MG PO TABS: 10.0000 mg | ORAL_TABLET | Freq: Every day | ORAL | 1 refills | Status: DC

## 2017-03-17 NOTE — Telephone Encounter (Signed)
Pharmacy request 90 day supply.

## 2017-03-25 ENCOUNTER — Other Ambulatory Visit: Payer: Self-pay | Admitting: Unknown Physician Specialty

## 2017-04-14 ENCOUNTER — Other Ambulatory Visit: Payer: Self-pay | Admitting: Unknown Physician Specialty

## 2017-04-15 ENCOUNTER — Telehealth: Payer: Self-pay | Admitting: Unknown Physician Specialty

## 2017-04-15 NOTE — Telephone Encounter (Signed)
Faxed patients script for Tramadol 04-15-2017

## 2017-04-22 ENCOUNTER — Ambulatory Visit (INDEPENDENT_AMBULATORY_CARE_PROVIDER_SITE_OTHER): Payer: Medicare Other | Admitting: *Deleted

## 2017-04-22 DIAGNOSIS — I442 Atrioventricular block, complete: Secondary | ICD-10-CM

## 2017-04-22 NOTE — Progress Notes (Signed)
Remote pacemaker transmission.   

## 2017-04-23 ENCOUNTER — Other Ambulatory Visit: Payer: Self-pay | Admitting: *Deleted

## 2017-04-23 ENCOUNTER — Telehealth: Payer: Self-pay | Admitting: Internal Medicine

## 2017-04-23 LAB — CUP PACEART REMOTE DEVICE CHECK
Battery Remaining Longevity: 50 mo
Battery Voltage: 2.99 V
Brady Statistic AP VP Percent: 36.72 %
Brady Statistic AS VS Percent: 1.93 %
Brady Statistic RA Percent Paced: 36.04 %
Brady Statistic RV Percent Paced: 96.54 %
Date Time Interrogation Session: 20180918152639
Implantable Lead Implant Date: 20141217
Implantable Lead Location: 753860
Implantable Lead Model: 5076
Implantable Pulse Generator Implant Date: 20141217
Lead Channel Impedance Value: 418 Ohm
Lead Channel Impedance Value: 608 Ohm
Lead Channel Impedance Value: 608 Ohm
Lead Channel Pacing Threshold Amplitude: 0.5 V
Lead Channel Sensing Intrinsic Amplitude: 4.875 mV
Lead Channel Setting Pacing Amplitude: 2 V
Lead Channel Setting Pacing Pulse Width: 0.4 ms
MDC IDC LEAD IMPLANT DT: 20141217
MDC IDC LEAD LOCATION: 753859
MDC IDC MSMT LEADCHNL RA IMPEDANCE VALUE: 437 Ohm
MDC IDC MSMT LEADCHNL RA SENSING INTR AMPL: 4.875 mV
MDC IDC MSMT LEADCHNL RV PACING THRESHOLD PULSEWIDTH: 0.4 ms
MDC IDC MSMT LEADCHNL RV SENSING INTR AMPL: 12 mV
MDC IDC MSMT LEADCHNL RV SENSING INTR AMPL: 12 mV
MDC IDC SET LEADCHNL RV PACING AMPLITUDE: 2.5 V
MDC IDC SET LEADCHNL RV SENSING SENSITIVITY: 0.9 mV
MDC IDC STAT BRADY AP VS PERCENT: 0.08 %
MDC IDC STAT BRADY AS VP PERCENT: 61.28 %

## 2017-04-23 MED ORDER — APIXABAN 5 MG PO TABS: 5.0000 mg | ORAL_TABLET | Freq: Two times a day (BID) | ORAL | 0 refills | Status: DC

## 2017-04-23 NOTE — Telephone Encounter (Signed)
Requested Prescriptions   Signed Prescriptions Disp Refills  . apixaban (ELIQUIS) 5 MG TABS tablet 180 tablet 0    Sig: Take 1 tablet (5 mg total) by mouth 2 (two) times daily.    Authorizing Provider: Deboraha Sprang    Ordering User: Britt Bottom

## 2017-04-23 NOTE — Telephone Encounter (Signed)
°*  STAT* If patient is at the pharmacy, call can be transferred to refill team.   1. Which medications need to be refilled? (please list name of each medication and dose if known)  eliquis   2. Which pharmacy/location (including street and city if local pharmacy) is medication to be sent to?  [facility]   3. Do they need a 30 day or 90 day supply? 90 day

## 2017-04-24 ENCOUNTER — Encounter: Payer: Self-pay | Admitting: Cardiology

## 2017-04-29 ENCOUNTER — Ambulatory Visit (INDEPENDENT_AMBULATORY_CARE_PROVIDER_SITE_OTHER): Payer: Medicare Other | Admitting: Internal Medicine

## 2017-04-29 ENCOUNTER — Encounter: Payer: Self-pay | Admitting: Internal Medicine

## 2017-04-29 ENCOUNTER — Ambulatory Visit (INDEPENDENT_AMBULATORY_CARE_PROVIDER_SITE_OTHER): Payer: Medicare Other | Admitting: Podiatry

## 2017-04-29 VITALS — BP 140/88 | HR 71 | Ht 60.0 in | Wt 178.8 lb

## 2017-04-29 DIAGNOSIS — E782 Mixed hyperlipidemia: Secondary | ICD-10-CM

## 2017-04-29 DIAGNOSIS — I442 Atrioventricular block, complete: Secondary | ICD-10-CM | POA: Diagnosis not present

## 2017-04-29 DIAGNOSIS — I481 Persistent atrial fibrillation: Secondary | ICD-10-CM | POA: Diagnosis not present

## 2017-04-29 DIAGNOSIS — M65971 Unspecified synovitis and tenosynovitis, right ankle and foot: Secondary | ICD-10-CM

## 2017-04-29 DIAGNOSIS — M65972 Unspecified synovitis and tenosynovitis, left ankle and foot: Secondary | ICD-10-CM

## 2017-04-29 DIAGNOSIS — I10 Essential (primary) hypertension: Secondary | ICD-10-CM | POA: Diagnosis not present

## 2017-04-29 DIAGNOSIS — M7751 Other enthesopathy of right foot: Secondary | ICD-10-CM

## 2017-04-29 DIAGNOSIS — I4819 Other persistent atrial fibrillation: Secondary | ICD-10-CM

## 2017-04-29 DIAGNOSIS — M659 Synovitis and tenosynovitis, unspecified: Secondary | ICD-10-CM | POA: Diagnosis not present

## 2017-04-29 DIAGNOSIS — Z950 Presence of cardiac pacemaker: Secondary | ICD-10-CM

## 2017-04-29 NOTE — Patient Instructions (Signed)
Medication Instructions: - Your physician recommends that you continue on your current medications as directed. Please refer to the Current Medication list given to you today.  Labwork: - Your physician recommends that you have lab work today: Hospital doctor LDL  Procedures/Testing: - none ordered  Follow-Up: - Remote monitoring is used to monitor your Pacemaker of ICD from home. This monitoring reduces the number of office visits required to check your device to one time per year. It allows [place] to keep an eye on the functioning of your device to ensure it is working properly. You are scheduled for a device check from home on 07/22/17. You may send your transmission at any time that day. If you have a wireless device, the transmission will be sent automatically. After your physician reviews your transmission, you will receive a postcard with your next transmission date.  - Your physician wants you to follow-up in: 1 year with Dr. Caryl Comes. You will receive a reminder letter in the mail two months in advance. If you don't receive a letter, please call our office to schedule the follow-up appointment.   Any Additional Special Instructions Will Be Listed Below (If Applicable).     If you need a refill on your cardiac medications before your next appointment, please call your pharmacy.

## 2017-04-29 NOTE — Progress Notes (Signed)
Patient Care Team: Kathrine Haddock, NP as PCP - General (Nurse Practitioner) Lavonia Dana, MD as Consulting Physician (Nephrology) Deboraha Sprang, MD as Consulting Physician (Cardiology) Edrick Kins, DPM as Consulting Physician (Podiatry)   HPI  Renee Boyd is a 66 y.o. female Seen in followup for largely asymptomatic permanent atrial fibrillation.  She transferred care to here 7/17.  At that time, anticoagulation was initiated. He was hypertensive and her amlodipine was increased.  She is s/p pacemaker insertion [employer] 2014  She has hx of HTN which has been poorly controlled.  She has DM and Grade 3 Renal insufficiency\\ Echo 7/17 showed normal LVEF  But PUL HTN with Pressures > 65 ( no E/E' recorded)      She's been seen by nephrology     This is been concurrent with the increase in her amlodipine as well as her son being treated with ongoing problems for cancer. There have been a lot of fast foods etc.    Mild edema but diet changed with bday and hjurricane  Some dypsnea but no recent chest pain   Records and Results Reviewed Date Cr K Hgb TSH  10/17 1.36  8.8 0.304  2/18 1.73 4.9  0.6  6/18  1.3  11.1     11/17 LDL  55  Past Medical History:  Diagnosis Date  . Diabetes mellitus without complication ([place])   . Hypertension   . Pacemaker   . Stroke [facility])     Past Surgical History:  Procedure Laterality Date  . ABDOMINAL HYSTERECTOMY    . CHOLECYSTECTOMY    . TONSILLECTOMY    . TUBAL LIGATION      Current Outpatient Prescriptions  Medication Sig Dispense Refill  . allopurinol (ZYLOPRIM) 100 MG tablet Take 1 tablet (100 mg total) by mouth daily. 30 tablet 6  . amLODipine (NORVASC) 10 MG tablet Take 1 tablet (10 mg total) by mouth daily. 30 tablet 3  . apixaban (ELIQUIS) 5 MG TABS tablet Take 1 tablet (5 mg total) by mouth 2 (two) times daily. 180 tablet 0  . atorvastatin (LIPITOR) 20 MG tablet Take 1 tablet (20 mg total) by mouth daily. 90  tablet 3  . cyclobenzaprine (FLEXERIL) 10 MG tablet Take 1 tablet (10 mg total) by mouth 3 (three) times daily as needed for muscle spasms. 30 tablet 1  . desonide (DESOWEN) 0.05 % lotion Apply 1 application topically as needed.    . furosemide (LASIX) 20 MG tablet Take two tablets (40 mg) every morning    . gabapentin (NEURONTIN) 300 MG capsule TAKE 1 CAPSULE BY MOUTH TWO TIMES DAILY. 180 capsule 1  . glucose blood test strip 1 each by Other route 2 (two) times daily. Use as instructed     . ibandronate (BONIVA) 150 MG tablet Take 1 tablet (150 mg total) by mouth every 30 (thirty) days. Take in the morning with a full glass of water, on an empty stomach, and do not take anything else by mouth 3 tablet 4  . JANUMET XR [phone] MG TB24 TAKE 1 TABLET BY MOUTH DAILY. 90 tablet 1  . levothyroxine (SYNTHROID, LEVOTHROID) 25 MCG tablet TAKE 1 TABLET BY MOUTH EVERY DAY BEFORE BREAKFAST 90 tablet 1  . lisinopril (PRINIVIL,ZESTRIL) 20 MG tablet Take 20 mg by mouth daily.    . metoprolol succinate (TOPROL-XL) 100 MG 24 hr tablet Take 1 tablet (100 mg total) by mouth daily. Take with or immediately following a meal. 90  tablet 3  . omeprazole (PRILOSEC) 20 MG capsule Take 1 capsule (20 mg total) by mouth 2 (two) times daily before a meal. 180 capsule 3  . temazepam (RESTORIL) 30 MG capsule TAKE 1 CAPSULE BY MOUTH AT BEDTIME AS NEEDED FOR SLEEP 30 capsule 2  . traMADol (ULTRAM) 50 MG tablet TAKE 1 TABLET BY MOUTH TWICE A DAY 60 tablet 1   Current Facility-Administered Medications  Medication Dose Route Frequency Provider Last Rate Last Dose  . betamethasone acetate-betamethasone sodium phosphate (CELESTONE) injection 3 mg  3 mg Intramuscular Once Daylene Katayama M, DPM      . betamethasone acetate-betamethasone sodium phosphate (CELESTONE) injection 3 mg  3 mg Intramuscular Once Edrick Kins, DPM        Allergies  Allergen Reactions  . Morphine And Related Itching  . Percocet [Oxycodone-Acetaminophen]  Other (See Comments)    Hallucinations       Review of Systems negative except from HPI and PMH  Physical Exam BP 140/88 (BP Location: Left Arm, Patient Position: Sitting, Cuff Size: Normal)   Pulse 71   Ht 5' (1.524 m)   Wt 178 lb 12 oz (81.1 kg)   BMI 34.91 kg/m  Well developed and nourished in no acute distress HENT normal Neck supple with JVP-flat Carotids brisk and full without bruits Clear Regular rate and rhythm, no murmurs or gallops Abd-soft with active BS without hepatomegaly No Clubbing cyanosis tr edema Skin-warm and dry A & Oriented  Grossly normal sensory and motor function   ECG  AFib  V pacing  71 -/14/42  Assessment and  Plan  Atrial fibrillation -permanent   Hypertension   Pacemaker    Edema  Hyperlipidemia  DM-nephropathy  Complete heart block   On Anticoagulation;  No bleeding issues  Hgb better   Will check LDL on statins -- given multiple CRF  Encouraged less sodium in diet    Current medicines are reviewed at length with the patient today .  The patient does not  have concerns regarding medicines.

## 2017-04-30 LAB — TSH: TSH: 0.818 u[IU]/mL (ref 0.450–4.500)

## 2017-04-30 LAB — LDL CHOLESTEROL, DIRECT: LDL Direct: 44 mg/dL (ref 0–99)

## 2017-04-30 MED ORDER — BETAMETHASONE SOD PHOS & ACET 6 (3-3) MG/ML IJ SUSP: 3.0000 mg | Freq: Once | INTRAMUSCULAR | Status: DC

## 2017-04-30 NOTE — Progress Notes (Signed)
   Subjective:  Patient with past medical history of diabetes mellitus presents today for follow-up evaluation of pain and tenderness to the bilateral ankles. She reports continued swelling and aching of the left ankle. She also has a new complaint of sudden onset aching to the right MPJ they began 2 days ago. She reports associated stiffness. She states it feels as if her gout is flaring up but reports improvement today.   Past Medical History:  Diagnosis Date  . Diabetes mellitus without complication ([place])   . Hypertension   . Pacemaker   . Stroke [facility])       Objective / Physical Exam:  General:  The patient is alert and oriented x3 in no acute distress. Dermatology:  Hyperkeratotic callus tissue noted to the plantar aspect weightbearing surface of the left heel. Skin is warm, dry and supple bilateral lower extremities. Negative for open lesions or macerations. Vascular:  Palpable pedal pulses bilaterally. No edema or erythema noted. Capillary refill within normal limits. Neurological:  Epicritic and protective threshold grossly intact bilaterally.  Musculoskeletal Exam:  Pain on palpation to the anterior lateral medial aspects of the patient's bilateral ankles. Mild edema noted. Pain with palpation to the first MPJ of the right foot. Range of motion within normal limits to all pedal and ankle joints bilateral. Muscle strength 5/5 in all groups bilateral.   Assessment: #1 pain in bilateral ankles  #2 synovitis bilateral ankles  #3 acute gout right first MPJ with capsulitis  Plan of Care:  #1 Patient was evaluated. #2 injection of 0.5 mL Celestone Soluspan injected in the patient's ankle joints bilaterally. #3 injection of 0.5 mL's of Celestone Soluspan injected into the patient's first MPJ of the right foot. #4 continue wearing compression anklets. #5 return to clinic when necessary.   Edrick Kins, DPM [facility]  Dr. Edrick Kins, [address]                                        [place], [place] [address]                Office [phone]  Fax [phone]

## 2017-05-05 ENCOUNTER — Telehealth: Payer: Self-pay

## 2017-05-05 NOTE — Telephone Encounter (Signed)
Pt is aware and agreeable normal results.

## 2017-05-06 ENCOUNTER — Other Ambulatory Visit: Payer: Self-pay

## 2017-05-06 MED ORDER — ATORVASTATIN CALCIUM 20 MG PO TABS: 20.0000 mg | ORAL_TABLET | Freq: Every day | ORAL | 3 refills | Status: DC

## 2017-05-09 LAB — CUP PACEART INCLINIC DEVICE CHECK
Battery Remaining Longevity: 50 mo
Battery Voltage: 2.99 V
Brady Statistic AP VP Percent: 26.27 %
Brady Statistic AP VS Percent: 0.02 %
Brady Statistic AS VP Percent: 72.91 %
Brady Statistic AS VS Percent: 0.8 %
Brady Statistic RV Percent Paced: 98.65 %
Date Time Interrogation Session: 20180925163054
Implantable Lead Implant Date: 20141217
Implantable Lead Location: 753860
Implantable Lead Model: 5076
Implantable Lead Model: 5076
Lead Channel Impedance Value: 399 Ohm
Lead Channel Impedance Value: 589 Ohm
Lead Channel Pacing Threshold Amplitude: 0.5 V
Lead Channel Sensing Intrinsic Amplitude: 5.125 mV
Lead Channel Setting Pacing Amplitude: 2.5 V
Lead Channel Setting Pacing Pulse Width: 0.4 ms
MDC IDC LEAD IMPLANT DT: 20141217
MDC IDC LEAD LOCATION: 753859
MDC IDC MSMT LEADCHNL RA IMPEDANCE VALUE: 418 Ohm
MDC IDC MSMT LEADCHNL RV IMPEDANCE VALUE: 570 Ohm
MDC IDC MSMT LEADCHNL RV PACING THRESHOLD PULSEWIDTH: 0.4 ms
MDC IDC PG IMPLANT DT: 20141217
MDC IDC SET LEADCHNL RA PACING AMPLITUDE: 2 V
MDC IDC SET LEADCHNL RV SENSING SENSITIVITY: 0.9 mV
MDC IDC STAT BRADY RA PERCENT PACED: 25.93 %

## 2017-05-15 ENCOUNTER — Other Ambulatory Visit: Payer: Self-pay

## 2017-05-15 MED ORDER — METOPROLOL SUCCINATE ER 100 MG PO TB24: 100.0000 mg | ORAL_TABLET | Freq: Every day | ORAL | 3 refills | Status: DC

## 2017-06-10 ENCOUNTER — Telehealth: Payer: Self-pay | Admitting: Unknown Physician Specialty

## 2017-06-10 MED ORDER — ALLOPURINOL 100 MG PO TABS: 100.0000 mg | ORAL_TABLET | Freq: Every day | ORAL | 3 refills | Status: DC

## 2017-06-10 MED ORDER — GABAPENTIN 300 MG PO CAPS: ORAL_CAPSULE | ORAL | 1 refills | Status: DC

## 2017-06-10 NOTE — Telephone Encounter (Signed)
Copied from [place] [phone]. Topic: Inquiry >> Jun 10, 2017  1:58 PM Oliver Pila B wrote: Reason for CRM: PT is wanting to know if she can get her Rx of ALLOPURINOL and GABAPENTIN on  a 90 day supply. She has some questions about them and wants to know if its possible to get those on a 90 day supply so that she can call them in. She also stated that she has an appt on friday

## 2017-06-10 NOTE — Telephone Encounter (Signed)
Routing to provider. Gabapentin is already written for a 90 day RX. Malachy Mood, can we change the allopurinol to a 90 day RX for the patient? Both are due to be refilled.

## 2017-06-11 ENCOUNTER — Telehealth: Payer: Self-pay | Admitting: Internal Medicine

## 2017-06-11 ENCOUNTER — Other Ambulatory Visit: Payer: Self-pay | Admitting: *Deleted

## 2017-06-11 MED ORDER — FUROSEMIDE 20 MG PO TABS: ORAL_TABLET | ORAL | 2 refills | Status: DC

## 2017-06-11 NOTE — Telephone Encounter (Signed)
°*  STAT* If patient is at the pharmacy, call can be transferred to refill team.   1. Which medications need to be refilled? (please list name of each medication and dose if known)   Lasix 40 mg po daily   2. Which pharmacy/location (including street and city if local pharmacy) is medication to be sent to?  [facility]   3. Do they need a 30 day or 90 day supply? [place]

## 2017-06-11 NOTE — Telephone Encounter (Signed)
Called and let patient know about prescriptions being sent in as requested. Call was disconnected but patient was notified about them before the disconnection.

## 2017-06-11 NOTE — Telephone Encounter (Signed)
Furosemide 40 mg tablet sent to [facility].

## 2017-06-12 ENCOUNTER — Other Ambulatory Visit: Payer: Self-pay | Admitting: *Deleted

## 2017-06-12 MED ORDER — FUROSEMIDE 20 MG PO TABS: ORAL_TABLET | ORAL | 2 refills | Status: DC

## 2017-06-12 NOTE — Telephone Encounter (Signed)
furosemide (LASIX) 20 MG tablet 180 tablet 2 06/12/2017 Sig: Take two tablets (40 mg) every morning Sent to pharmacy as: furosemide (LASIX) 20 MG tablet E-Prescribing Status: Receipt confirmed by pharmacy (06/12/2017  8:32 AM EST)

## 2017-06-12 NOTE — Telephone Encounter (Signed)
Drew from [employer] calling to ask if we can send an escript for 180 day rather than 90 day.

## 2017-06-13 ENCOUNTER — Encounter: Payer: Self-pay | Admitting: Unknown Physician Specialty

## 2017-06-13 ENCOUNTER — Ambulatory Visit (INDEPENDENT_AMBULATORY_CARE_PROVIDER_SITE_OTHER): Payer: Medicare Other | Admitting: Unknown Physician Specialty

## 2017-06-13 VITALS — BP 126/74 | HR 91 | Temp 97.6°F | Wt 177.0 lb

## 2017-06-13 DIAGNOSIS — M7582 Other shoulder lesions, left shoulder: Secondary | ICD-10-CM

## 2017-06-13 DIAGNOSIS — E1122 Type 2 diabetes mellitus with diabetic chronic kidney disease: Secondary | ICD-10-CM | POA: Diagnosis not present

## 2017-06-13 DIAGNOSIS — E1165 Type 2 diabetes mellitus with hyperglycemia: Secondary | ICD-10-CM | POA: Diagnosis not present

## 2017-06-13 DIAGNOSIS — I10 Essential (primary) hypertension: Secondary | ICD-10-CM

## 2017-06-13 DIAGNOSIS — N183 Chronic kidney disease, stage 3 unspecified: Secondary | ICD-10-CM

## 2017-06-13 DIAGNOSIS — IMO0002 Reserved for concepts with insufficient information to code with codable children: Secondary | ICD-10-CM

## 2017-06-13 MED ORDER — TRIAMCINOLONE ACETONIDE 40 MG/ML IJ SUSP
40.0000 mg | Freq: Once | INTRAMUSCULAR | Status: AC
  Administered 2017-06-13: 40 mg via INTRAMUSCULAR

## 2017-06-13 MED ORDER — GLIPIZIDE 5 MG PO TABS: 5.0000 mg | ORAL_TABLET | Freq: Two times a day (BID) | ORAL | 3 refills | Status: DC

## 2017-06-13 NOTE — Assessment & Plan Note (Addendum)
Hgb A1C is 7.1. Work on diet and exercise.  We need to change Janumet for renal dosing.  Stop Januvia and Metformin.  Start Glipizide BID

## 2017-06-13 NOTE — Progress Notes (Signed)
BP 126/74   Pulse 91   Temp 97.6 F (36.4 C) (Oral)   Wt 177 lb (80.3 kg)   SpO2 93%   BMI 34.57 kg/m    Subjective:    Patient ID: Renee Boyd, female    DOB: 04-24-1951, 66 y.o.   MRN: [id number]  HPI: Renee Boyd is a 66 y.o. female  Chief Complaint  Patient presents with  . Diabetes  . Hyperlipidemia  . Hypertension  . Shoulder Pain    pt would like a cortizone injection in left shoulder   Diabetes: Using medications without difficulties No hypoglycemic episodes No hyperglycemic episodes Feet problems:none Blood Sugars averaging: 113 this AM eye exam within last year Last Hgb A1C: 7.1  Hypertension  Using medications without difficulty Average home BPs   Using medication without problems or lightheadedness No chest pain with exertion or shortness of breath No Edema  Elevated Cholesterol Using medications without problems No Muscle aches  Diet:  Exercise:         CKD Seeing Nephrology every 6 months  Shoulder Needs an injection in left shoulder.  Last one was in August.    Relevant past medical, surgical, family and social history reviewed and updated as indicated. Interim medical history since our last visit reviewed. Allergies and medications reviewed and updated.  Review of Systems  Per HPI unless specifically indicated above     Objective:    BP 126/74   Pulse 91   Temp 97.6 F (36.4 C) (Oral)   Wt 177 lb (80.3 kg)   SpO2 93%   BMI 34.57 kg/m   Wt Readings from Last 3 Encounters:  06/13/17 177 lb (80.3 kg)  04/29/17 178 lb 12 oz (81.1 kg)  03/12/17 177 lb (80.3 kg)    Physical Exam  Constitutional: She is oriented to person, place, and time. She appears well-developed and well-nourished. No distress.  HENT:  Head: Normocephalic and atraumatic.  Eyes: Conjunctivae and lids are normal. Right eye exhibits no discharge. Left eye exhibits no discharge. No scleral icterus.  Neck: Normal range of motion. Neck supple. No JVD  present. Carotid bruit is not present.  Cardiovascular: Normal rate, regular rhythm and normal heart sounds.  Pulmonary/Chest: Effort normal and breath sounds normal.  Abdominal: Normal appearance. There is no splenomegaly or hepatomegaly.  Musculoskeletal:       Left shoulder: She exhibits decreased range of motion and tenderness.  Neurological: She is alert and oriented to person, place, and time.  Skin: Skin is warm, dry and intact. No rash noted. No pallor.  Psychiatric: She has a normal mood and affect. Her behavior is normal. Judgment and thought content normal.    Results for orders placed or performed in visit on 04/29/17  Direct LDL  Result Value Ref Range   LDL Direct 44 0 - 99 mg/dL  TSH  Result Value Ref Range   TSH 0.818 0.450 - 4.500 uIU/mL  CUP PACEART INCLINIC DEVICE CHECK  Result Value Ref Range   Date Time Interrogation Session [id number]    Pulse Generator Manufacturer MERM    Pulse Gen Model [id number] Advisa DR MRI    Pulse Gen Serial Number [id number]    Clinic Name [facility]    Implantable Pulse Generator Type Implantable Pulse Generator    Implantable Pulse Generator Implant Date [id number]    Implantable Lead Manufacturer MERM    Implantable Lead Model 5076 CapSureFix Novus MRI SureScan    Implantable Lead Serial Number [id number]  Implantable Lead Implant Date 25003704    Implantable Lead Location Detail 1 UNKNOWN    Implantable Lead Location [phone]    Implantable Lead Manufacturer MERM    Implantable Lead Model 5076 CapSureFix Novus MRI SureScan    Implantable Lead Serial Number [id number]    Implantable Lead Implant Date [id number]    Implantable Lead Location Detail 1 UNKNOWN    Implantable Lead Location [phone]    Lead Channel Setting Sensing Sensitivity 0.9 mV   Lead Channel Setting Pacing Amplitude 2 V   Lead Channel Setting Pacing Pulse Width 0.4 ms   Lead Channel Setting Pacing Amplitude 2.5 V   Lead Channel Impedance Value 418 ohm   Lead  Channel Impedance Value 399 ohm   Lead Channel Sensing Intrinsic Amplitude 5.125 mV   Lead Channel Impedance Value 589 ohm   Lead Channel Impedance Value 570 ohm   Lead Channel Pacing Threshold Amplitude 0.5 V   Lead Channel Pacing Threshold Pulse Width 0.4 ms   Battery Status OK    Battery Remaining Longevity 50 mo   Battery Voltage 2.99 V   Brady Statistic RA Percent Paced 25.93 %   Brady Statistic RV Percent Paced 98.65 %   Brady Statistic AP VP Percent 26.27 %   Brady Statistic AS VP Percent 72.91 %   Brady Statistic AP VS Percent 0.02 %   Brady Statistic AS VS Percent 0.80 %   Eval Rhythm AFl/Vp @ 30 w/PVCs       Assessment & Plan:   Problem List Items Addressed This Visit      Unprioritized   Essential hypertension, benign    Stable, continue present medications.        Rotator cuff tendonitis, left    STEROID INJECTION  Procedure: Shoulder Intraarticular Steroid Injection Consent: DEL Risks, benefits, and alternative treatments discussed and all questions were answered.  Patient elected to proceed and verbal consent obtained.  Description: Area prepped and draped using   semi-sterile technique. Using a posterolateral approach a mixture of 9cc 0.5% marcaine & 1 cc Kenalog 40 injected in shoulder joint.  A bandage was then placed over the injection site. Complications:  None  Tolerated procedure well with good ROM following injection          Stage 3 chronic kidney disease (HCC) - Primary    Check GFR today.  Renally dose medications      Uncontrolled type 2 diabetes mellitus with chronic kidney disease, without long-term current use of insulin (HCC)    Hgb A1C is 7.1. Work on diet and exercise.  We need to change Janumet for renal dosing.  Stop Januvia and Metformin.  Start Glipizide BID      Relevant Medications   glipiZIDE (GLUCOTROL) 5 MG tablet   Other Relevant Orders   Lipid Panel w/o Chol/HDL Ratio   Comprehensive metabolic panel   Bayer DCA Hb A1c  Waived       Follow up plan: Return in about 3 months (around 09/13/2017).

## 2017-06-13 NOTE — Assessment & Plan Note (Addendum)
Check GFR today.  Renally dose medications

## 2017-06-13 NOTE — Assessment & Plan Note (Addendum)
STEROID INJECTION  Procedure: Shoulder Intraarticular Steroid Injection Consent: DEL Risks, benefits, and alternative treatments discussed and all questions were answered.  Patient elected to proceed and verbal consent obtained.  Description: Area prepped and draped using   semi-sterile technique. Using a posterolateral approach a mixture of 9cc 0.5% marcaine & 1 cc Kenalog 40 injected in shoulder joint.  A bandage was then placed over the injection site. Complications:  None  Tolerated procedure well with good ROM following injection

## 2017-06-13 NOTE — Assessment & Plan Note (Signed)
Stable, continue present medications.   

## 2017-06-14 LAB — LIPID PANEL W/O CHOL/HDL RATIO
Cholesterol, Total: 110 mg/dL (ref 100–199)
HDL: 35 mg/dL — AB (ref >39)
LDL CALC: 47 mg/dL (ref 0–99)
Triglycerides: 139 mg/dL (ref 0–149)
VLDL CHOLESTEROL CAL: 28 mg/dL (ref 5–40)

## 2017-06-14 LAB — COMPREHENSIVE METABOLIC PANEL
A/G RATIO: 1.9 (ref 1.2–2.2)
ALK PHOS: 56 IU/L (ref 39–117)
ALT: 11 IU/L (ref 0–32)
AST: 14 IU/L (ref 0–40)
Albumin: 4.4 g/dL (ref 3.6–4.8)
BUN/Creatinine Ratio: 20 (ref 12–28)
BUN: 31 mg/dL — ABNORMAL HIGH (ref 8–27)
Bilirubin Total: <0.2 mg/dL (ref 0.0–1.2)
CO2: 24 mmol/L (ref 20–29)
CREATININE: 1.55 mg/dL — AB (ref 0.57–1.00)
Calcium: 10.2 mg/dL (ref 8.7–10.3)
Chloride: 101 mmol/L (ref 96–106)
GFR calc Af Amer: 39 mL/min/{1.73_m2} — ABNORMAL LOW (ref >59)
GFR calc non Af Amer: 34 mL/min/{1.73_m2} — ABNORMAL LOW (ref >59)
GLOBULIN, TOTAL: 2.3 g/dL (ref 1.5–4.5)
Glucose: 204 mg/dL — ABNORMAL HIGH (ref 65–99)
POTASSIUM: 4.2 mmol/L (ref 3.5–5.2)
SODIUM: 140 mmol/L (ref 134–144)
Total Protein: 6.7 g/dL (ref 6.0–8.5)

## 2017-06-14 LAB — BAYER DCA HB A1C WAIVED: HB A1C: 7.1 % — AB (ref <7.0)

## 2017-06-16 ENCOUNTER — Encounter: Payer: Self-pay | Admitting: Unknown Physician Specialty

## 2017-06-23 ENCOUNTER — Other Ambulatory Visit: Payer: Self-pay | Admitting: Unknown Physician Specialty

## 2017-06-23 NOTE — Telephone Encounter (Signed)
Request for controlled substances

## 2017-06-23 NOTE — Telephone Encounter (Signed)
Faxed patients scripts

## 2017-07-08 DIAGNOSIS — H40003 Preglaucoma, unspecified, bilateral: Secondary | ICD-10-CM | POA: Diagnosis not present

## 2017-07-17 DIAGNOSIS — R809 Proteinuria, unspecified: Secondary | ICD-10-CM | POA: Diagnosis not present

## 2017-07-17 DIAGNOSIS — I129 Hypertensive chronic kidney disease with stage 1 through stage 4 chronic kidney disease, or unspecified chronic kidney disease: Secondary | ICD-10-CM | POA: Diagnosis not present

## 2017-07-17 DIAGNOSIS — E1122 Type 2 diabetes mellitus with diabetic chronic kidney disease: Secondary | ICD-10-CM | POA: Diagnosis not present

## 2017-07-17 DIAGNOSIS — N183 Chronic kidney disease, stage 3 (moderate): Secondary | ICD-10-CM | POA: Diagnosis not present

## 2017-07-17 DIAGNOSIS — N2581 Secondary hyperparathyroidism of renal origin: Secondary | ICD-10-CM | POA: Diagnosis not present

## 2017-07-17 DIAGNOSIS — E875 Hyperkalemia: Secondary | ICD-10-CM | POA: Diagnosis not present

## 2017-07-21 ENCOUNTER — Other Ambulatory Visit: Payer: Self-pay | Admitting: Unknown Physician Specialty

## 2017-07-22 ENCOUNTER — Ambulatory Visit (INDEPENDENT_AMBULATORY_CARE_PROVIDER_SITE_OTHER): Payer: Medicare Other | Admitting: *Deleted

## 2017-07-22 ENCOUNTER — Telehealth: Payer: Self-pay | Admitting: Internal Medicine

## 2017-07-22 ENCOUNTER — Other Ambulatory Visit: Payer: Self-pay | Admitting: *Deleted

## 2017-07-22 DIAGNOSIS — I442 Atrioventricular block, complete: Secondary | ICD-10-CM | POA: Diagnosis not present

## 2017-07-22 MED ORDER — APIXABAN 5 MG PO TABS: 5.0000 mg | ORAL_TABLET | Freq: Two times a day (BID) | ORAL | 2 refills | Status: DC

## 2017-07-22 NOTE — Telephone Encounter (Signed)
°*  STAT* If patient is at the pharmacy, call can be transferred to refill team.   1. Which medications need to be refilled? (please list name of each medication and dose if known)  Eliquis   2. Which pharmacy/location (including street and city if local pharmacy) is medication to be sent to? [facility]   3. Do they need a 30 day or 90 day supply? 90 day

## 2017-07-22 NOTE — Telephone Encounter (Signed)
Requested Prescriptions   Signed Prescriptions Disp Refills  . apixaban (ELIQUIS) 5 MG TABS tablet 180 tablet 2    Sig: Take 1 tablet (5 mg total) by mouth 2 (two) times daily.    Authorizing Provider: Deboraha Sprang    Ordering User: Britt Bottom

## 2017-07-22 NOTE — Progress Notes (Signed)
Remote pacemaker transmission.   

## 2017-07-23 ENCOUNTER — Encounter: Payer: Self-pay | Admitting: Cardiology

## 2017-07-24 LAB — CUP PACEART REMOTE DEVICE CHECK
Battery Remaining Longevity: 51 mo
Brady Statistic AP VP Percent: 52.84 %
Brady Statistic AP VS Percent: 0.13 %
Brady Statistic AS VP Percent: 44.49 %
Brady Statistic RA Percent Paced: 47.39 %
Brady Statistic RV Percent Paced: 95.11 %
Date Time Interrogation Session: 20181218172020
Implantable Lead Implant Date: 20141217
Implantable Lead Implant Date: 20141217
Implantable Lead Location: 753860
Implantable Lead Model: 5076
Lead Channel Impedance Value: 399 Ohm
Lead Channel Impedance Value: 570 Ohm
Lead Channel Impedance Value: 589 Ohm
Lead Channel Sensing Intrinsic Amplitude: 19.875 mV
Lead Channel Sensing Intrinsic Amplitude: 19.875 mV
Lead Channel Setting Pacing Amplitude: 2 V
Lead Channel Setting Sensing Sensitivity: 0.9 mV
MDC IDC LEAD LOCATION: 753859
MDC IDC MSMT BATTERY VOLTAGE: 2.99 V
MDC IDC MSMT LEADCHNL RA IMPEDANCE VALUE: 418 Ohm
MDC IDC MSMT LEADCHNL RA SENSING INTR AMPL: 4.375 mV
MDC IDC MSMT LEADCHNL RA SENSING INTR AMPL: 4.375 mV
MDC IDC MSMT LEADCHNL RV PACING THRESHOLD AMPLITUDE: 0.5 V
MDC IDC MSMT LEADCHNL RV PACING THRESHOLD PULSEWIDTH: 0.4 ms
MDC IDC PG IMPLANT DT: 20141217
MDC IDC SET LEADCHNL RV PACING AMPLITUDE: 2.5 V
MDC IDC SET LEADCHNL RV PACING PULSEWIDTH: 0.4 ms
MDC IDC STAT BRADY AS VS PERCENT: 2.55 %

## 2017-08-12 ENCOUNTER — Telehealth: Payer: Self-pay

## 2017-08-12 ENCOUNTER — Telehealth: Payer: Self-pay | Admitting: Unknown Physician Specialty

## 2017-08-12 DIAGNOSIS — E1165 Type 2 diabetes mellitus with hyperglycemia: Principal | ICD-10-CM

## 2017-08-12 DIAGNOSIS — IMO0002 Reserved for concepts with insufficient information to code with codable children: Secondary | ICD-10-CM

## 2017-08-12 DIAGNOSIS — E1122 Type 2 diabetes mellitus with diabetic chronic kidney disease: Secondary | ICD-10-CM

## 2017-08-12 NOTE — Telephone Encounter (Signed)
There is a bariatric medical doctor in [place].  Her name is Dr Leafy Ro.  Can someone tell me how to refer to her?

## 2017-08-12 NOTE — Telephone Encounter (Signed)
Copied from [place] [phone]. Topic: Referral - Request >> Aug 12, 2017 11:35 AM Ahmed Prima L wrote: Patient wants to see a dr that can better help her plan her meals & help her loose weight. She states she is overweight. Call back is [phone]   Routing to provider.

## 2017-08-12 NOTE — Telephone Encounter (Signed)
Called and spoke to patient. She states that she uses [facility] and checks her blood sugar twice daily. Form filled out, signed by provider, and faxed to pharmacy.

## 2017-08-12 NOTE — Telephone Encounter (Signed)
Copied from [place] [id number]. Topic: Quick Communication - See Telephone Encounter >> Aug 12, 2017 11:37 AM Ahmed Prima L wrote: CRM for notification. See Telephone encounter for:   08/12/17.  Patient said she requested a refill through her pharmacy & it was denied by Malachy Mood for the glucose blood test strip Please advise  [facility]

## 2017-08-13 NOTE — Telephone Encounter (Signed)
Patient can call [phone]. Patient will have to attend a Bariatric Course and call and get the dates and times.

## 2017-08-14 NOTE — Telephone Encounter (Signed)
Can you pass that on to the pt please?  Are you sure that is for medical bariatrics and not surgical?

## 2017-08-15 NOTE — Telephone Encounter (Signed)
Patient stated she would like to see a nutritionist. Please enter in referral to [place].  Patient stated she'd rather do that before seeing a bariatric doctor.

## 2017-08-18 ENCOUNTER — Telehealth: Payer: Self-pay | Admitting: Unknown Physician Specialty

## 2017-08-18 NOTE — Telephone Encounter (Signed)
Routing to provider  

## 2017-08-18 NOTE — Telephone Encounter (Signed)
Copied from [place] [id number]. Topic: Inquiry >> Aug 18, 2017  2:11 PM Renee Boyd, [place] wrote: Reason for CRM: Pt call and said her Blood Sugar Being Hi 198 and 200

## 2017-08-18 NOTE — Telephone Encounter (Signed)
I see she is getting steroid injections.  An increase in blood sugar is typical.  She can try 2 Glipizide instead of one

## 2017-08-18 NOTE — Telephone Encounter (Signed)
Patient notified and verbalized understanding of provider's instructions.

## 2017-08-21 ENCOUNTER — Other Ambulatory Visit: Payer: Self-pay | Admitting: Unknown Physician Specialty

## 2017-08-21 NOTE — Telephone Encounter (Signed)
See refill requests for Tramadol and Temazepam

## 2017-08-22 ENCOUNTER — Telehealth: Payer: Self-pay | Admitting: Unknown Physician Specialty

## 2017-08-22 NOTE — Telephone Encounter (Signed)
Copied from [place] [id number]. Topic: Quick Communication - Rx Refill/Question >> Aug 22, 2017  2:04 PM Oliver Pila B wrote: Medication: temazepam (RESTORIL) 30 MG capsule [[id number]],  traMADol (ULTRAM) 50 MG tablet [[id number]]    Has the patient contacted their pharmacy? Yes.     (Agent: If no, request that the patient contact the pharmacy for the refill.)   Preferred Pharmacy (with phone number or street name): [facility]    Agent: Please be advised that RX refills may take up to 3 business days. We ask that you follow-up with your pharmacy.

## 2017-08-22 NOTE — Telephone Encounter (Signed)
Looked in patients chart and it appears that patient has two refills in for both medications.

## 2017-08-25 ENCOUNTER — Other Ambulatory Visit: Payer: Self-pay | Admitting: Unknown Physician Specialty

## 2017-08-25 NOTE — Telephone Encounter (Signed)
Pt states she called the pharmacy on Sat and they have not received these meds.

## 2017-08-25 NOTE — Telephone Encounter (Signed)
Medication refills. Last office visit 06/13/17. Thanks.

## 2017-08-26 ENCOUNTER — Other Ambulatory Visit: Payer: Self-pay | Admitting: Unknown Physician Specialty

## 2017-08-26 ENCOUNTER — Ambulatory Visit (INDEPENDENT_AMBULATORY_CARE_PROVIDER_SITE_OTHER): Payer: Medicare Other | Admitting: Podiatry

## 2017-08-26 ENCOUNTER — Encounter: Payer: Self-pay | Admitting: Podiatry

## 2017-08-26 DIAGNOSIS — M659 Synovitis and tenosynovitis, unspecified: Secondary | ICD-10-CM

## 2017-08-26 DIAGNOSIS — M7751 Other enthesopathy of right foot: Secondary | ICD-10-CM

## 2017-08-26 DIAGNOSIS — M7752 Other enthesopathy of left foot: Secondary | ICD-10-CM

## 2017-08-26 NOTE — Telephone Encounter (Signed)
Rx has been faxed and the pt will be notified.

## 2017-08-26 NOTE — Telephone Encounter (Signed)
Patient called about Tramedol and Temazepam to see if it's been sent to pharm. Advised pharm sent reorder request as of yesterday and it's in the works. Patient has not gotten any sleep.

## 2017-08-30 NOTE — Progress Notes (Signed)
   Subjective: 67 year old female presenting today with a chief complaint of bilateral bunions that have been symptomatic for several weeks. She states the pain is affecting the way she walks which causes pain to bilateral ankles. The injections she received at the previous visit helped alleviate the pain temporarily. Walking increases the pain. Patient is here for further evaluation and treatment.   Past Medical History:  Diagnosis Date  . Diabetes mellitus without complication ([place])   . Hypertension   . Pacemaker   . Stroke [facility])     Objective: Physical Exam General: The patient is alert and oriented x3 in no acute distress.  Dermatology: Skin is cool, dry and supple bilateral lower extremities. Negative for open lesions or macerations.  Vascular: Palpable pedal pulses bilaterally. No edema or erythema noted. Capillary refill within normal limits.  Neurological: Epicritic and protective threshold grossly intact bilaterally.   Musculoskeletal Exam: Clinical evidence of bunion deformity noted to the respective foot. There is a moderate pain on palpation range of motion of the first MPJ. Lateral deviation of the hallux noted consistent with hallux abductovalgus. Pain on palpation to the anterior lateral medial aspects of the patient's left ankle. Mild edema noted. Pain with palpation to the first MPJ of bilateral feet.  Assessment: 1. HAV w/ bunion deformity bilateral feet 2. Left ankle synovitis  3. 1st MPJ capsulitis bilateral   Plan of Care:  1. Patient was evaluated.  2. Injection of 0.5 mLs Celestone Soluspan injected into the left ankle joint. 3. Injection of 0.5 mLs Celestone Soluspan injected into bilateral 1st MPJs. 4. Continue wearing good shoe gear. 5. Return to clinic as needed.   Edrick Kins, DPM [facility]  Dr. Edrick Kins, [place]                                        [place], [place] [address]                Office [phone]  Fax [phone]

## 2017-09-01 ENCOUNTER — Telehealth: Payer: Self-pay | Admitting: *Deleted

## 2017-09-01 ENCOUNTER — Encounter: Payer: Self-pay | Admitting: *Deleted

## 2017-09-01 ENCOUNTER — Encounter: Payer: Medicare Other | Attending: Unknown Physician Specialty | Admitting: *Deleted

## 2017-09-01 VITALS — BP 112/70 | Ht 60.0 in | Wt 177.7 lb

## 2017-09-01 DIAGNOSIS — E1122 Type 2 diabetes mellitus with diabetic chronic kidney disease: Secondary | ICD-10-CM | POA: Insufficient documentation

## 2017-09-01 DIAGNOSIS — E119 Type 2 diabetes mellitus without complications: Secondary | ICD-10-CM

## 2017-09-01 DIAGNOSIS — Z6834 Body mass index (BMI) 34.0-34.9, adult: Secondary | ICD-10-CM | POA: Diagnosis not present

## 2017-09-01 DIAGNOSIS — Z713 Dietary counseling and surveillance: Secondary | ICD-10-CM | POA: Insufficient documentation

## 2017-09-01 DIAGNOSIS — N189 Chronic kidney disease, unspecified: Secondary | ICD-10-CM | POA: Diagnosis not present

## 2017-09-01 NOTE — Telephone Encounter (Signed)
Phone call not made on this patient. Erroneous encounter.

## 2017-09-01 NOTE — Patient Instructions (Addendum)
Check blood sugars 1-2 x day before breakfast and 2 hrs after supper every day Bring blood sugar records to the next appointment  Exercise: Walk as tolerated (keep moving)  Eat 3 meals day,  1-2 snacks a day Space meals 4-6 hours apart Don't delay meals longer than 6 hours Limit fruit juices  Complete 3 Day Food Record and bring to next appt  Return for appointment on:  Monday September 22, 2017 at 3:00 pm with [facility] (dietitian)

## 2017-09-02 NOTE — Progress Notes (Signed)
Diabetes Self-Management Education  Visit Type: First/Initial  Appt. Start Time: 1610 Appt. End Time: 7622  09/01/2017  [name], identified by name and date of birth, is a 67 y.o. female with a diagnosis of Diabetes: Type 2.   ASSESSMENT  Blood pressure 112/70, height 5' (1.524 m), weight 177 lb 11.2 oz (80.6 kg). Body mass index is 34.7 kg/m.  Diabetes Self-Management Education - 09/01/17 1742      Visit Information   Visit Type  First/Initial      Initial Visit   Diabetes Type  Type 2    Are you currently following a meal plan?  No    Are you taking your medications as prescribed?  Yes    Date Diagnosed  6 years ago      Health Coping   How would you rate your overall health?  Fair      Psychosocial Assessment   Patient Belief/Attitude about Diabetes  Motivated to manage diabetes    Self-care barriers  None    Self-management support  Doctor's office    Patient Concerns  Nutrition/Meal planning;Weight Control;Monitoring;Healthy Lifestyle    Special Needs  None    Preferred Learning Style  Auditory;Hands on;Visual    Learning Readiness  Ready    How often do you need to have someone help you when you read instructions, pamphlets, or other written materials from your doctor or pharmacy?  1 - Never    What is the last grade level you completed in school?  12 +      Pre-Education Assessment   Patient understands the diabetes disease and treatment process.  Needs Instruction    Patient understands incorporating nutritional management into lifestyle.  Needs Instruction    Patient undertands incorporating physical activity into lifestyle.  Needs Instruction    Patient understands using medications safely.  Needs Instruction    Patient understands monitoring blood glucose, interpreting and using results  Needs Review    Patient understands prevention, detection, and treatment of acute complications.  Needs Instruction    Patient understands prevention, detection, and  treatment of chronic complications.  Needs Review    Patient understands how to develop strategies to address psychosocial issues.  Needs Instruction    Patient understands how to develop strategies to promote health/change behavior.  Needs Instruction      Complications   Last HgB A1C per patient/outside source  7.1 % 06/13/17    How often do you check your blood sugar?  1-2 times/day    Fasting Blood glucose range (mg/dL)  70-129;130-179 Pt reports checking 1-2 x day - mostly fasting and ranging from 110-150's mg/dL with reading of 151 mg/dL today.      Have you had a dilated eye exam in the past 12 months?  Yes    Have you had a dental exam in the past 12 months?  Yes    Are you checking your feet?  Yes    How many days per week are you checking your feet?  7      Dietary Intake   Breakfast  skips    Lunch  eggs, [place] bacon, toast; boiled eggs and grits    Snack (afternoon)  fruit    Dinner  cooks meals and eats them for 2-3 days - soup and crackers; rotisserie chicken, green beans, potatoes; meatloaf, salmon, tuna, peas, corn, beans, ocassional spaghettie, salads    Beverage(s)  water, coffee, orange juice, occasional diet soda  Exercise   Exercise Type  ADL's      Patient Education   Previous Diabetes Education  No    Disease state   Definition of diabetes, type 1 and 2, and the diagnosis of diabetes;Explored patient's options for treatment of their diabetes    Nutrition management   Role of diet in the treatment of diabetes and the relationship between the three main macronutrients and blood glucose level;Reviewed blood glucose goals for pre and post meals and how to evaluate the patients' food intake on their blood glucose level.    Physical activity and exercise   Role of exercise on diabetes management, blood pressure control and cardiac health.    Medications  Reviewed patients medication for diabetes, action, purpose, timing of dose and side effects.    Monitoring   Purpose and frequency of SMBG.;Taught/discussed recording of test results and interpretation of SMBG.;Identified appropriate SMBG and/or A1C goals.    Chronic complications  Relationship between chronic complications and blood glucose control    Psychosocial adjustment  Identified and addressed patients feelings and concerns about diabetes      Individualized Goals (developed by patient)   Reducing Risk Prevent diabetes complications Lose weight Lead a healthier lifestyle Become more fit     Outcomes   Expected Outcomes  Demonstrated interest in learning. Expect positive outcomes       Individualized Plan for Diabetes Self-Management Training:   Learning Objective:  Patient will have a greater understanding of diabetes self-management. Patient education plan is to attend individual and/or group sessions per assessed needs and concerns.   Plan:   Patient Instructions  Check blood sugars 1-2 x day before breakfast and 2 hrs after supper every day Bring blood sugar records to the next appointment Exercise: Walk as tolerated (keep moving) Eat 3 meals day,  1-2 snacks a day Space meals 4-6 hours apart Don't delay meals longer than 6 hours Limit fruit juices  Complete 3 Day Food Record and bring to next appt Return for appointment on:  Monday September 22, 2017 at 3:00 pm with Pam (dietitian)   Expected Outcomes:  Demonstrated interest in learning. Expect positive outcomes  Education material provided:  General Meal Planning Guidelines Simple Meal Plan 3 Day Food Record Glucose Log Books  If problems or questions, patient to contact team via:  Johny Drilling, [place], [place], CDE [phone]  Future DSME appointment:  September 22, 2017 with the dietitian

## 2017-09-10 ENCOUNTER — Other Ambulatory Visit: Payer: Self-pay | Admitting: Internal Medicine

## 2017-09-11 ENCOUNTER — Telehealth: Payer: Self-pay | Admitting: Dietician

## 2017-09-11 NOTE — Telephone Encounter (Signed)
Called patient to reschedule her appointment from 09/22/17 due to schedule conflict for RD. Rescheduled to 09/24/17.

## 2017-09-16 ENCOUNTER — Ambulatory Visit (INDEPENDENT_AMBULATORY_CARE_PROVIDER_SITE_OTHER): Payer: Medicare Other | Admitting: Unknown Physician Specialty

## 2017-09-16 ENCOUNTER — Encounter: Payer: Self-pay | Admitting: Unknown Physician Specialty

## 2017-09-16 VITALS — BP 140/81 | HR 91 | Temp 98.4°F | Wt 174.8 lb

## 2017-09-16 DIAGNOSIS — I10 Essential (primary) hypertension: Secondary | ICD-10-CM | POA: Diagnosis not present

## 2017-09-16 DIAGNOSIS — E1122 Type 2 diabetes mellitus with diabetic chronic kidney disease: Secondary | ICD-10-CM

## 2017-09-16 DIAGNOSIS — R252 Cramp and spasm: Secondary | ICD-10-CM | POA: Diagnosis not present

## 2017-09-16 DIAGNOSIS — Z23 Encounter for immunization: Secondary | ICD-10-CM

## 2017-09-16 DIAGNOSIS — N183 Chronic kidney disease, stage 3 unspecified: Secondary | ICD-10-CM

## 2017-09-16 DIAGNOSIS — E1165 Type 2 diabetes mellitus with hyperglycemia: Secondary | ICD-10-CM | POA: Diagnosis not present

## 2017-09-16 DIAGNOSIS — IMO0002 Reserved for concepts with insufficient information to code with codable children: Secondary | ICD-10-CM

## 2017-09-16 LAB — BAYER DCA HB A1C WAIVED: HB A1C (BAYER DCA - WAIVED): 7.7 % — ABNORMAL HIGH (ref <7.0)

## 2017-09-16 MED ORDER — GABAPENTIN 300 MG PO CAPS: ORAL_CAPSULE | ORAL | 1 refills | Status: DC

## 2017-09-16 MED ORDER — LEVOTHYROXINE SODIUM 25 MCG PO TABS: 25.0000 ug | ORAL_TABLET | Freq: Every day | ORAL | 1 refills | Status: DC

## 2017-09-16 MED ORDER — GLIPIZIDE 10 MG PO TABS: 10.0000 mg | ORAL_TABLET | Freq: Two times a day (BID) | ORAL | 3 refills | Status: DC

## 2017-09-16 NOTE — Assessment & Plan Note (Signed)
Hgb A1C was 7.7%.  This is not surprising due to variance of medication.  She did well on glipizide 10 mg BID.  Will restart.  Also Janumet not good for kidney disease

## 2017-09-16 NOTE — Progress Notes (Signed)
BP 140/81   Pulse 91   Temp 98.4 F (36.9 C) (Oral)   Wt 174 lb 12.8 oz (79.3 kg)   SpO2 98%   BMI 34.14 kg/m    Subjective:    Patient ID: Renee Boyd, female    DOB: 27-Aug-1950, 67 y.o.   MRN: [id number]  HPI: Renee Boyd is a 67 y.o. female  Chief Complaint  Patient presents with  . Diabetes  . Hyperlipidemia  . Hypertension   Diabetes: I had her on Glipizide as "it gave out" and restarted Janumet.  .  [facility] and changing her diet.   No hypoglycemic episodes No hyperglycemic episodes Feet problems:none Blood Sugars averaging: 118 eye exam within last year Last Hgb A1C:7.1  Hypertension  Using medications without difficulty Average home BPs Not checking   Using medication without problems or lightheadedness No chest pain with exertion or shortness of breath No Edema  Elevated Cholesterol Using medications without problems No Muscle aches  Diet: Exercise:Working on diet and exercise.  She lost 3 pounds  Leg cramps Pt is having sudden onset of muscle cramps at night.  She can't take tonic water.    Insomnia Has been taking Temazepam started by another provider for hears.    Relevant past medical, surgical, family and social history reviewed and updated as indicated. Interim medical history since our last visit reviewed. Allergies and medications reviewed and updated.  Review of Systems  Per HPI unless specifically indicated above     Objective:    BP 140/81   Pulse 91   Temp 98.4 F (36.9 C) (Oral)   Wt 174 lb 12.8 oz (79.3 kg)   SpO2 98%   BMI 34.14 kg/m   Wt Readings from Last 3 Encounters:  09/16/17 174 lb 12.8 oz (79.3 kg)  09/01/17 177 lb 11.2 oz (80.6 kg)  06/13/17 177 lb (80.3 kg)    Physical Exam  Constitutional: She is oriented to person, place, and time. She appears well-developed and well-nourished. No distress.  HENT:  Head: Normocephalic and atraumatic.  Eyes: Conjunctivae and lids are normal. Right  eye exhibits no discharge. Left eye exhibits no discharge. No scleral icterus.  Neck: Normal range of motion. Neck supple. No JVD present. Carotid bruit is not present.  Cardiovascular: Normal rate, regular rhythm and normal heart sounds.  Pulmonary/Chest: Effort normal and breath sounds normal.  Abdominal: Normal appearance. There is no splenomegaly or hepatomegaly.  Musculoskeletal: Normal range of motion.  Neurological: She is alert and oriented to person, place, and time.  Skin: Skin is warm, dry and intact. No rash noted. No pallor.  Psychiatric: She has a normal mood and affect. Her behavior is normal. Judgment and thought content normal.    Results for orders placed or performed in visit on 09/16/17  Bayer DCA Hb A1c Waived  Result Value Ref Range   Bayer DCA Hb A1c Waived 7.7 (H) <7.0 %      Assessment & Plan:   Problem List Items Addressed This Visit      Unprioritized   Essential hypertension, benign    Not to goal.  Working on diet and exercise.  Recheck in 3 months      Muscle cramps at night    Increase Gabapentin at night to 600 mg.  Add Magnesium supplements      Stage 3 chronic kidney disease (HCC)    Seeing Dr. Abigail Butts      Uncontrolled type 2 diabetes  mellitus with chronic kidney disease, without long-term current use of insulin (HCC)    Hgb A1C was 7.7%.  This is not surprising due to variance of medication.  She did well on glipizide 10 mg BID.  Will restart.  Also Janumet not good for kidney disease      Relevant Medications   glipiZIDE (GLUCOTROL) 10 MG tablet   Other Relevant Orders   Comprehensive metabolic panel   Bayer DCA Hb A1c Waived (Completed)    Other Visit Diagnoses    Need for influenza vaccination    -  Primary   Relevant Orders   Pneumococcal polysaccharide vaccine 23-valent greater than or equal to 2yo subcutaneous/IM (Completed)       Follow up plan: Return in about 3 months (around 12/14/2017).

## 2017-09-16 NOTE — Assessment & Plan Note (Signed)
Increase Gabapentin at night to 600 mg.  Add Magnesium supplements

## 2017-09-16 NOTE — Assessment & Plan Note (Signed)
Seeing Dr. Abigail Butts

## 2017-09-16 NOTE — Patient Instructions (Addendum)
Pneumococcal Polysaccharide Vaccine: What You Need to Know 1. Why get vaccinated? Vaccination can protect older adults (and some children and younger adults) from pneumococcal disease. Pneumococcal disease is caused by bacteria that can spread from person to person through close contact. It can cause ear infections, and it can also lead to more serious infections of the:  Lungs (pneumonia),  Blood (bacteremia), and  Covering of the brain and spinal cord (meningitis). Meningitis can cause deafness and brain damage, and it can be fatal.  Anyone can get pneumococcal disease, but children under 52 years of age, people with certain medical conditions, adults over 104 years of age, and cigarette smokers are at the highest risk. About 18,000 older adults die each year from pneumococcal disease in the [place]. Treatment of pneumococcal infections with penicillin and other drugs used to be more effective. But some strains of the disease have become resistant to these drugs. This makes prevention of the disease, through vaccination, even more important. 2. Pneumococcal polysaccharide vaccine (PPSV23) Pneumococcal polysaccharide vaccine (PPSV23) protects against 23 types of pneumococcal bacteria. It will not prevent all pneumococcal disease. PPSV23 is recommended for:  All adults 31 years of age and older,  Anyone 2 through 69 years of age with certain long-term health problems,  Anyone 2 through 69 years of age with a weakened immune system,  Adults 35 through 69 years of age who smoke cigarettes or have asthma.  Most people need only one dose of PPSV. A second dose is recommended for certain high-risk groups. People 73 and older should get a dose even if they have gotten one or more doses of the vaccine before they turned 65. Your healthcare provider can give you more information about these recommendations. Most healthy adults develop protection within 2 to 3 weeks of getting the shot. 3.  Some people should not get this vaccine  Anyone who has had a life-threatening allergic reaction to PPSV should not get another dose.  Anyone who has a severe allergy to any component of PPSV should not receive it. Tell your provider if you have any severe allergies.  Anyone who is moderately or severely ill when the shot is scheduled may be asked to wait until they recover before getting the vaccine. Someone with a mild illness can usually be vaccinated.  Children less than 64 years of age should not receive this vaccine.  There is no evidence that PPSV is harmful to either a pregnant woman or to her fetus. However, as a precaution, women who need the vaccine should be vaccinated before becoming pregnant, if possible. 4. Risks of a vaccine reaction With any medicine, including vaccines, there is a chance of side effects. These are usually mild and go away on their own, but serious reactions are also possible. About half of people who get PPSV have mild side effects, such as redness or pain where the shot is given, which go away within about two days. Less than 1 out of 100 people develop a fever, muscle aches, or more severe local reactions. Problems that could happen after any vaccine:  People sometimes faint after a medical procedure, including vaccination. Sitting or lying down for about 15 minutes can help prevent fainting, and injuries caused by a fall. Tell your doctor if you feel dizzy, or have vision changes or ringing in the ears.  Some people get severe pain in the shoulder and have difficulty moving the arm where a shot was given. This happens very rarely.  Any  medication can cause a severe allergic reaction. Such reactions from a vaccine are very rare, estimated at about 1 in a million doses, and would happen within a few minutes to a few hours after the vaccination. As with any medicine, there is a very remote chance of a vaccine causing a serious injury or death. The safety of  vaccines is always being monitored. For more information, visit: http://www.aguilar.org/ 5. What if there is a serious reaction? What should I look for? Look for anything that concerns you, such as signs of a severe allergic reaction, very high fever, or unusual behavior. Signs of a severe allergic reaction can include hives, swelling of the face and throat, difficulty breathing, a fast heartbeat, dizziness, and weakness. These would usually start a few minutes to a few hours after the vaccination. What should I do? If you think it is a severe allergic reaction or other emergency that can't wait, call 9-1-1 or get to the nearest hospital. Otherwise, call your doctor. Afterward, the reaction should be reported to the Vaccine Adverse Event Reporting System (VAERS). Your doctor might file this report, or you can do it yourself through the VAERS web site at www.vaers.SamedayNews.es, or by calling 276 697 3858. VAERS does not give medical advice. 6. How can I learn more?  Ask your doctor. He or she can give you the vaccine package insert or suggest other sources of information.  Call your local or state health department.  Contact the Centers for Disease Control and Prevention (CDC): ? Call (712) 027-7239 (1-800-CDC-INFO) or ? Visit CDC's website at http://hunter.com/ CDC Pneumococcal Polysaccharide Vaccine VIS (11/26/13) This information is not intended to replace advice given to you by your health care provider. Make sure you discuss any questions you have with your health care provider. ------------------------------------------------- Take Mg Supplement for leg cramps

## 2017-09-16 NOTE — Assessment & Plan Note (Signed)
Not to goal.  Working on diet and exercise.  Recheck in 3 months

## 2017-09-17 ENCOUNTER — Encounter: Payer: Self-pay | Admitting: Unknown Physician Specialty

## 2017-09-17 LAB — COMPREHENSIVE METABOLIC PANEL
ALBUMIN: 4.5 g/dL (ref 3.6–4.8)
ALT: 10 IU/L (ref 0–32)
AST: 12 IU/L (ref 0–40)
Albumin/Globulin Ratio: 1.9 (ref 1.2–2.2)
Alkaline Phosphatase: 67 IU/L (ref 39–117)
BUN / CREAT RATIO: 14 (ref 12–28)
BUN: 19 mg/dL (ref 8–27)
CHLORIDE: 100 mmol/L (ref 96–106)
CO2: 21 mmol/L (ref 20–29)
CREATININE: 1.39 mg/dL — AB (ref 0.57–1.00)
Calcium: 9.6 mg/dL (ref 8.7–10.3)
GFR calc non Af Amer: 39 mL/min/{1.73_m2} — ABNORMAL LOW (ref >59)
GFR, EST AFRICAN AMERICAN: 45 mL/min/{1.73_m2} — AB (ref >59)
GLOBULIN, TOTAL: 2.4 g/dL (ref 1.5–4.5)
Glucose: 152 mg/dL — ABNORMAL HIGH (ref 65–99)
Potassium: 4.6 mmol/L (ref 3.5–5.2)
Sodium: 140 mmol/L (ref 134–144)
TOTAL PROTEIN: 6.9 g/dL (ref 6.0–8.5)

## 2017-09-22 ENCOUNTER — Ambulatory Visit: Payer: Medicare Other | Admitting: Dietician

## 2017-09-24 ENCOUNTER — Encounter: Payer: Self-pay | Admitting: Dietician

## 2017-09-24 ENCOUNTER — Encounter: Payer: Medicare Other | Attending: Unknown Physician Specialty | Admitting: Dietician

## 2017-09-24 VITALS — BP 140/78 | Ht 60.0 in | Wt 175.7 lb

## 2017-09-24 DIAGNOSIS — E119 Type 2 diabetes mellitus without complications: Secondary | ICD-10-CM

## 2017-09-24 DIAGNOSIS — N189 Chronic kidney disease, unspecified: Secondary | ICD-10-CM | POA: Insufficient documentation

## 2017-09-24 DIAGNOSIS — E1122 Type 2 diabetes mellitus with diabetic chronic kidney disease: Secondary | ICD-10-CM | POA: Diagnosis not present

## 2017-09-24 DIAGNOSIS — Z6834 Body mass index (BMI) 34.0-34.9, adult: Secondary | ICD-10-CM | POA: Diagnosis not present

## 2017-09-24 DIAGNOSIS — Z713 Dietary counseling and surveillance: Secondary | ICD-10-CM | POA: Diagnosis not present

## 2017-09-24 NOTE — Patient Instructions (Signed)
   Increase daily/ weekly walking -- start with at least 10 minutes at a time and gradually increase as you gain energy.   Eat something every 3-5 hours during the day while you are awake. Example: 12pm, 4pm, 8pm and 12am (midnight).  Good job including protein foods with meals, this helps prevent low blood sugar and keeps you full longer.   If you have symptoms of low blood sugar (shaky, weak, maybe lightheaded), check sugar if you can, and take 4oz of regular fruit juice, wait 10-15 minutes to start feeling better, then eat a meal or snack within 30 minutes.

## 2017-09-24 NOTE — Progress Notes (Signed)
Diabetes Self-Management Education  Visit Type:  Follow-up  Appt. Start Time: 1330 Appt. End Time: 1430  09/24/2017  [name], identified by name and date of birth, is a 67 y.o. female with a diagnosis of Diabetes:  .   ASSESSMENT  Blood pressure 140/78, height 5' (1.524 m), weight 175 lb 11.2 oz (79.7 kg). Body mass index is 34.31 kg/m.   Diabetes Self-Management Education - 19/41/74 0814      Complications   How often do you check your blood sugar?  1-2 times/day    Fasting Blood glucose range (mg/dL)  70-129    Postprandial Blood glucose range (mg/dL)  130-179    Have you had a dilated eye exam in the past 12 months?  Yes    Have you had a dental exam in the past 12 months?  Yes appointment 10/2017    Are you checking your feet?  Yes sees podiatrist regularly    How many days per week are you checking your feet?  7      Dietary Intake   Breakfast  skips; occasionally applesauce and toast or cheese toast 1 slice bread.     Lunch  eggs, [place] bacon, toast    Snack (afternoon)  fruit 4-5pm    Dinner  7pm     Snack (evening)  occasional snack i.e. yogurt, trying to limit 12:30am 2/20 raisin bran     Beverage(s)  water, coffee, occasional orange juice, occasionally diet soda      Exercise   Exercise Type  ADL's      Patient Education   Nutrition management   Role of diet in the treatment of diabetes and the relationship between the three main macronutrients and blood glucose level;Reviewed blood glucose goals for pre and post meals and how to evaluate the patients' food intake on their blood glucose level.;Meal timing in regards to the patients' current diabetes medication.;Meal options for control of blood glucose level and chronic complications.;Other (comment) basic meal planning using plate method    Physical activity and exercise   Role of exercise on diabetes management, blood pressure control and cardiac health.    Monitoring  Taught/discussed recording of test  results and interpretation of SMBG.    Acute complications  Taught treatment of hypoglycemia - the 15 rule.    Psychosocial adjustment  Role of stress on diabetes      Post-Education Assessment   Patient understands the diabetes disease and treatment process.  Demonstrates understanding / competency    Patient understands incorporating nutritional management into lifestyle.  Demonstrates understanding / competency    Patient undertands incorporating physical activity into lifestyle.  Demonstrates understanding / competency    Patient understands using medications safely.  Demonstrates understanding / competency    Patient understands monitoring blood glucose, interpreting and using results  Demonstrates understanding / competency    Patient understands prevention, detection, and treatment of acute complications.  Demonstrates understanding / competency    Patient understands prevention, detection, and treatment of chronic complications.  Demonstrates understanding / competency    Patient understands how to develop strategies to address psychosocial issues.  Demonstrates understanding / competency    Patient understands how to develop strategies to promote health/change behavior.  Demonstrates understanding / competency      Outcomes   Program Status  Completed       Learning Objective:  Patient will have a greater understanding of diabetes self-management. Patient education plan is to attend individual and/or group sessions per  assessed needs and concerns.  Notes: Renee Boyd reports increasing Glipizide dose to 10mg  twice daily. Her BG record since then reflects decreased readings, including some fasting BGs in 70s. She denies any hypoglycemic symptoms. We discussed treatment for low BGs and encouraged her to keep a snack with her when away from home. She is working to control food portions in effort to control blood sugars and lose weight; her weight has decreased about 2lbs since previous  visit on 09/01/17. She is making healthy food choices in general.    Plan:   Patient Instructions   Increase daily/ weekly walking -- start with at least 10 minutes at a time and gradually increase as you gain energy.   Eat something every 3-5 hours during the day while you are awake. Example: 12pm, 4pm, 8pm and 12am (midnight).  Good job including protein foods with meals, this helps prevent low blood sugar and keeps you full longer.   If you have symptoms of low blood sugar (shaky, weak, maybe lightheaded), check sugar if you can, and take 4oz of regular fruit juice, wait 10-15 minutes to start feeling better, then eat a meal or snack within 30 minutes.     Expected Outcomes:  Demonstrated interest in learning. Expect positive outcomes  Education material provided: Quick and Easy Meal Ideas; Hypoglycemia ([place]); goals and instructions  If problems or questions, patient to contact team via:  Phone

## 2017-10-20 DIAGNOSIS — I129 Hypertensive chronic kidney disease with stage 1 through stage 4 chronic kidney disease, or unspecified chronic kidney disease: Secondary | ICD-10-CM | POA: Diagnosis not present

## 2017-10-20 DIAGNOSIS — R601 Generalized edema: Secondary | ICD-10-CM | POA: Diagnosis not present

## 2017-10-20 DIAGNOSIS — N183 Chronic kidney disease, stage 3 (moderate): Secondary | ICD-10-CM | POA: Diagnosis not present

## 2017-10-20 DIAGNOSIS — N2581 Secondary hyperparathyroidism of renal origin: Secondary | ICD-10-CM | POA: Diagnosis not present

## 2017-10-20 DIAGNOSIS — E1122 Type 2 diabetes mellitus with diabetic chronic kidney disease: Secondary | ICD-10-CM | POA: Diagnosis not present

## 2017-10-21 ENCOUNTER — Telehealth: Payer: Self-pay | Admitting: *Deleted

## 2017-10-21 ENCOUNTER — Ambulatory Visit (INDEPENDENT_AMBULATORY_CARE_PROVIDER_SITE_OTHER): Payer: Medicare Other | Admitting: *Deleted

## 2017-10-21 DIAGNOSIS — I442 Atrioventricular block, complete: Secondary | ICD-10-CM

## 2017-10-21 NOTE — Progress Notes (Signed)
Remote pacemaker transmission.   

## 2017-10-21 NOTE — Telephone Encounter (Signed)
Received call from patient. She was concerned about her blood sugars. Recently 200's but she is currently having increase in fluid in her legs and weight now 181 lbs (she was 175 lbs on 09/24/17). She reports her cardiologist is aware. Her pacemaker has been checked and she had blood drawn. Discussed increase in blood sugars due to current stress of her body. She has been walking until the last few days. Patient reports frustration since she was doing so well. Support given. She is only taking Glipizide for BG control. Patient reports she did well on Janumet but PCP would not reorder due to kidney disease. Her nephrologist has told her she could take it because her last labs were good. Discussed having her nephrologist contact PCP to discuss medications. She just wanted to update me on her status.

## 2017-10-22 ENCOUNTER — Encounter: Payer: Self-pay | Admitting: Cardiology

## 2017-10-23 ENCOUNTER — Telehealth: Payer: Self-pay | Admitting: Unknown Physician Specialty

## 2017-10-23 NOTE — Telephone Encounter (Signed)
Copied from [place]. Topic: Quick Communication - Rx Refill/Question >> Oct 23, 2017 10:05 AM Synthia Innocent wrote: Medication:JANUMET XR [phone] MG TB24   Has the patient contacted their pharmacy? Yes.   (Agent: If no, request that the patient contact the pharmacy for the refill.) Preferred Pharmacy (with phone number or street name): [facility]  Agent: Please be advised that RX refills may take up to 3 business days. We ask that you follow-up with your pharmacy.

## 2017-10-23 NOTE — Telephone Encounter (Signed)
Spoke with pt. States "my kidney doctor said I could go back on Janumet." Reports has some swelling to Ankles and feet - "my kidney doctor is aware of that too." Is taking Lasix 20 mg 1 twice a day.Please advise pt.

## 2017-10-24 ENCOUNTER — Other Ambulatory Visit: Payer: Self-pay

## 2017-10-24 ENCOUNTER — Encounter: Payer: Self-pay | Admitting: *Deleted

## 2017-10-24 ENCOUNTER — Emergency Department: Payer: Medicare Other

## 2017-10-24 ENCOUNTER — Emergency Department
Admission: EM | Admit: 2017-10-24 | Discharge: 2017-10-25 | Disposition: A | Discharge status: Home / Self Care | Payer: Medicare Other | Attending: Emergency Medicine | Admitting: Emergency Medicine

## 2017-10-24 DIAGNOSIS — E039 Hypothyroidism, unspecified: Secondary | ICD-10-CM | POA: Insufficient documentation

## 2017-10-24 DIAGNOSIS — Z950 Presence of cardiac pacemaker: Secondary | ICD-10-CM | POA: Diagnosis not present

## 2017-10-24 DIAGNOSIS — E1122 Type 2 diabetes mellitus with diabetic chronic kidney disease: Secondary | ICD-10-CM | POA: Insufficient documentation

## 2017-10-24 DIAGNOSIS — Z7984 Long term (current) use of oral hypoglycemic drugs: Secondary | ICD-10-CM | POA: Insufficient documentation

## 2017-10-24 DIAGNOSIS — Z87891 Personal history of nicotine dependence: Secondary | ICD-10-CM | POA: Diagnosis not present

## 2017-10-24 DIAGNOSIS — Z8673 Personal history of transient ischemic attack (TIA), and cerebral infarction without residual deficits: Secondary | ICD-10-CM | POA: Diagnosis not present

## 2017-10-24 DIAGNOSIS — I129 Hypertensive chronic kidney disease with stage 1 through stage 4 chronic kidney disease, or unspecified chronic kidney disease: Secondary | ICD-10-CM | POA: Diagnosis not present

## 2017-10-24 DIAGNOSIS — Z7901 Long term (current) use of anticoagulants: Secondary | ICD-10-CM | POA: Diagnosis not present

## 2017-10-24 DIAGNOSIS — R609 Edema, unspecified: Secondary | ICD-10-CM | POA: Diagnosis not present

## 2017-10-24 DIAGNOSIS — N183 Chronic kidney disease, stage 3 (moderate): Secondary | ICD-10-CM | POA: Diagnosis not present

## 2017-10-24 DIAGNOSIS — R0602 Shortness of breath: Secondary | ICD-10-CM | POA: Diagnosis not present

## 2017-10-24 DIAGNOSIS — R062 Wheezing: Secondary | ICD-10-CM | POA: Diagnosis not present

## 2017-10-24 DIAGNOSIS — I10 Essential (primary) hypertension: Secondary | ICD-10-CM | POA: Diagnosis not present

## 2017-10-24 DIAGNOSIS — R600 Localized edema: Secondary | ICD-10-CM | POA: Diagnosis not present

## 2017-10-24 DIAGNOSIS — R2240 Localized swelling, mass and lump, unspecified lower limb: Secondary | ICD-10-CM | POA: Diagnosis present

## 2017-10-24 LAB — BASIC METABOLIC PANEL WITH GFR
Anion gap: 12 (ref 5–15)
BUN: 35 mg/dL — ABNORMAL HIGH (ref 6–20)
CO2: 26 mmol/L (ref 22–32)
Calcium: 9.4 mg/dL (ref 8.9–10.3)
Chloride: 99 mmol/L — ABNORMAL LOW (ref 101–111)
Creatinine, Ser: 2.02 mg/dL — ABNORMAL HIGH (ref 0.44–1.00)
GFR calc Af Amer: 28 mL/min — ABNORMAL LOW
GFR calc non Af Amer: 24 mL/min — ABNORMAL LOW
Glucose, Bld: 184 mg/dL — ABNORMAL HIGH (ref 65–99)
Potassium: 4.3 mmol/L (ref 3.5–5.1)
Sodium: 137 mmol/L (ref 135–145)

## 2017-10-24 LAB — CBC
HCT: 37.1 % (ref 35.0–47.0)
Hemoglobin: 11.9 g/dL — ABNORMAL LOW (ref 12.0–16.0)
MCH: 28.4 pg (ref 26.0–34.0)
MCHC: 32 g/dL (ref 32.0–36.0)
MCV: 89 fL (ref 80.0–100.0)
Platelets: 233 K/uL (ref 150–440)
RBC: 4.17 MIL/uL (ref 3.80–5.20)
RDW: 15.5 % — ABNORMAL HIGH (ref 11.5–14.5)
WBC: 7.4 K/uL (ref 3.6–11.0)

## 2017-10-24 LAB — TROPONIN I: Troponin I: <0.03 ng/mL

## 2017-10-24 LAB — GLUCOSE, CAPILLARY: Glucose-Capillary: 178 mg/dL — ABNORMAL HIGH (ref 65–99)

## 2017-10-24 NOTE — Telephone Encounter (Signed)
Reviewed Nephrology note.  It says to restart Metformin.  Januvia would need to be renally dosed and 100 mg is not appropriate.

## 2017-10-24 NOTE — ED Triage Notes (Addendum)
Pt to ED reporting bilateral leg swelling with +2 pitting edema. Pt reports the swelling has increased today. Pt reports also having increased SOB upon exertion.  since last week. No CP at this time. Pt has never been dx with CHF.    Pt is also a diabetic. Pt reports her CBG has been running high (230-280) ever since PCP changed diabetes medications.

## 2017-10-25 LAB — BRAIN NATRIURETIC PEPTIDE: B Natriuretic Peptide: 31 pg/mL (ref 0.0–100.0)

## 2017-10-25 NOTE — Discharge Instructions (Addendum)
Please take 40 mg extra of Lasix at night for the next 5 days.  Please follow-up with your cardiologist Dr. Caryl Comes in regards to this peripheral edema and your increase in dry weight.  Please return with any chest pain, worsening shortness of breath, decreased urine output.

## 2017-10-25 NOTE — ED Notes (Signed)

## 2017-10-25 NOTE — ED Notes (Signed)
ED Provider at bedside. 

## 2017-10-25 NOTE — ED Provider Notes (Signed)
[facility] Emergency Department Provider Note   ____________________________________________   First MD Initiated Contact with Patient 10/24/17 2352     (approximate)  I have reviewed the triage vital signs and the nursing notes.   HISTORY  Chief Complaint Leg Swelling    HPI Renee Boyd is a 67 y.o. female who comes into the hospital today stating that she is been having some complications with her nurse practitioner.  She reports that at her doctor's office she sees a Designer, jewellery.  She reports that her nurse practitioner changed her diabetes medication and her blood sugars have been elevated to the 200s.  She reports that she is now taking glipizide.  She states that she had seen her kidney doctor and spoke to them about her swelling.  She reports that she was walking and had some chest burning and shortness of breath.  She reports that she is not happy with her nurse practitioner as they have not been addressing her leg swelling that has been going on for days.  She reports that her dry weight in February was 179 but on Monday it was 181.  The patient states that she has been sneaking to take an extra fluid pill for the last 3 days.  She asked her son to find her a new doctor but received a call from her doctor's office after hours today.  The nurse told her to come into the emergency department for evaluation.  She is not have any chest pain at this time or any shortness of breath.  The patient states that she takes 40 mg of Lasix in the morning and has been taking 20 mg at night for the last 3 nights.  She thinks that her swelling has improved slightly but she is here for evaluation.  Past Medical History:  Diagnosis Date  . Diabetes mellitus without complication ([place])   . GERD (gastroesophageal reflux disease)   . Hypertension   . Pacemaker   . Stroke [facility])     Patient Active Problem List   Diagnosis Date Noted  . Muscle cramps at night  09/16/2017  . Hyperuricemia 12/10/2016  . Advanced care planning/counseling discussion 12/10/2016  . Rotator cuff tendonitis, left 09/10/2016  . Hypothyroidism 09/10/2016  . Essential hypertension, benign 09/10/2016  . Stage 3 chronic kidney disease ([place]) 05/13/2016  . Primary insomnia 05/13/2016  . Uncontrolled type 2 diabetes mellitus with chronic kidney disease, without long-term current use of insulin ([place]) 05/13/2016  . Heart block 05/13/2016  . Chronic pain 05/13/2016  . Chronic hip pain 05/13/2016  . Symptomatic anemia 02/07/2016  . Supratherapeutic INR 02/07/2016    Past Surgical History:  Procedure Laterality Date  . ABDOMINAL HYSTERECTOMY    . CHOLECYSTECTOMY    . TONSILLECTOMY    . TUBAL LIGATION      Prior to Admission medications   Medication Sig Start Date End Date Taking? Authorizing Provider  allopurinol (ZYLOPRIM) 100 MG tablet Take 1 tablet (100 mg total) daily by mouth. 06/10/17   Kathrine Haddock, NP  amLODipine (NORVASC) 10 MG tablet TAKE 1 TABLET BY MOUTH DAILY 09/10/17   Deboraha Sprang, MD  apixaban (ELIQUIS) 5 MG TABS tablet Take 1 tablet (5 mg total) by mouth 2 (two) times daily. 07/22/17   Deboraha Sprang, MD  atorvastatin (LIPITOR) 20 MG tablet Take 1 tablet (20 mg total) by mouth daily. 05/06/17   Deboraha Sprang, MD  cyclobenzaprine (FLEXERIL) 10 MG tablet Take 1 tablet (  10 mg total) by mouth 3 (three) times daily as needed for muscle spasms. 05/13/16   Kathrine Haddock, NP  desonide (DESOWEN) 0.05 % lotion Apply 1 application topically as needed.    [provider]  furosemide (LASIX) 20 MG tablet Take two tablets (40 mg) every morning 06/12/17   Deboraha Sprang, MD  gabapentin (NEURONTIN) 300 MG capsule TAKE 1 CAPSULE BY MOUTH TWO TIMES DAILY.  Add another QHS. 09/16/17   Kathrine Haddock, NP  glipiZIDE (GLUCOTROL) 10 MG tablet Take 1 tablet (10 mg total) by mouth 2 (two) times daily before a meal. 09/16/17   Kathrine Haddock, NP  glucose blood test strip 1  each by Other route 2 (two) times daily. Use as instructed     [provider]  ibandronate (BONIVA) 150 MG tablet Take 1 tablet (150 mg total) by mouth every 30 (thirty) days. Take in the morning with a full glass of water, on an empty stomach, and do not take anything else by mouth 01/14/17   Kathrine Haddock, NP  levothyroxine (SYNTHROID, LEVOTHROID) 25 MCG tablet Take 1 tablet (25 mcg total) by mouth daily before breakfast. 09/16/17   Kathrine Haddock, NP  lisinopril (PRINIVIL,ZESTRIL) 20 MG tablet Take 20 mg by mouth daily. 07/11/16   [provider]  metoprolol succinate (TOPROL-XL) 100 MG 24 hr tablet Take 1 tablet (100 mg total) by mouth daily. Take with or immediately following a meal. 05/15/17   Deboraha Sprang, MD  omeprazole (PRILOSEC) 20 MG capsule TAKE 1 CAPSULE BY MOUTH TWICE A DAY BEFORE A MEAL 07/21/17   Kathrine Haddock, NP  temazepam (RESTORIL) 30 MG capsule TAKE 1 CAPSULE BY MOUTH AT BEDTIME AS NEEDED FOR SLEEP 08/22/17   Kathrine Haddock, NP  traMADol (ULTRAM) 50 MG tablet TAKE 1 TABLET BY MOUTH TWICE A DAY 08/22/17   Kathrine Haddock, NP    Allergies Morphine and related and Percocet [oxycodone-acetaminophen]  Family History  Problem Relation Age of Onset  . Hypertension Other   . Heart disease Mother        MI  . Alcohol abuse Father   . Autism Son   . Heart disease Maternal Grandmother   . Cancer Son        colon  . Miscarriages / Stillbirths Maternal Aunt     Social History Social History   Tobacco Use  . Smoking status: Former Smoker    Packs/day: 2.00    Years: 40.00    Pack years: 80.00    Types: Cigarettes    Last attempt to quit: 06/27/2005    Years since quitting: 12.3  . Smokeless tobacco: Never Used  . Tobacco comment: quit Jun 27 2005  Substance Use Topics  . Alcohol use: No  . Drug use: No    Review of Systems  Constitutional: No fever/chills Eyes: No visual changes. ENT: No sore throat. Cardiovascular: Denies chest  pain. Respiratory: Denies shortness of breath. Gastrointestinal: No abdominal pain.  No nausea, no vomiting.  No diarrhea.  No constipation. Genitourinary: Negative for dysuria. Musculoskeletal: swelling to lower legs Skin: Negative for rash. Neurological: Negative for headaches, focal weakness or numbness.   ____________________________________________   PHYSICAL EXAM:  VITAL SIGNS: ED Triage Vitals  Enc Vitals Group     BP 10/24/17 2053 (!) 144/56     Pulse Rate 10/24/17 2053 70     Resp 10/24/17 2053 18     Temp 10/24/17 2053 97.8 F (36.6 C)     Temp Source  10/24/17 2053 Oral     SpO2 10/24/17 2053 99 %     Weight 10/24/17 2105 175 lb (79.4 kg)     Height 10/24/17 2105 5' (1.524 m)     Head Circumference --      Peak Flow --      Pain Score 10/24/17 2111 0     Pain Loc --      Pain Edu? --      Excl. in [place]? --     Constitutional: Alert and oriented. Well appearing and in mild distress. Eyes: Conjunctivae are normal. PERRL. EOMI. Head: Atraumatic. Nose: No congestion/rhinnorhea. Mouth/Throat: Mucous membranes are moist.  Oropharynx non-erythematous. Cardiovascular: Normal rate, regular rhythm. Grossly normal heart sounds.  Good peripheral circulation. Respiratory: Normal respiratory effort.  No retractions. Lungs CTAB. Gastrointestinal: Soft and nontender. No distention. Positive bowel sounds Musculoskeletal: Bilateral lower extremity nonpitting edema Neurologic:  Normal speech and language.  Skin:  Skin is warm, dry and intact.  Psychiatric: Mood and affect are normal.   ____________________________________________   LABS (all labs ordered are listed, but only abnormal results are displayed)  Labs Reviewed  BASIC METABOLIC PANEL - Abnormal; Notable for the following components:      Result Value   Chloride 99 (*)    Glucose, Bld 184 (*)    BUN 35 (*)    Creatinine, Ser 2.02 (*)    GFR calc non Af Amer 24 (*)    GFR calc Af Amer 28 (*)    All other  components within normal limits  CBC - Abnormal; Notable for the following components:   Hemoglobin 11.9 (*)    RDW 15.5 (*)    All other components within normal limits  GLUCOSE, CAPILLARY - Abnormal; Notable for the following components:   Glucose-Capillary 178 (*)    All other components within normal limits  TROPONIN I  BRAIN NATRIURETIC PEPTIDE   ____________________________________________  EKG  ED ECG REPORT I, Loney Hering, the attending physician, personally viewed and interpreted this ECG.   Date: 10/24/2017  EKG Time: 2101  Rate: 67  Rhythm: Ventricular paced rhythm with PVCs.  Axis: Left axis deviation  Intervals:none  ST&T Change: Ventricular paced rhythm  ____________________________________________  RADIOLOGY  ED MD interpretation: Chest x-ray: Cardiomegaly with no pulmonary edema visualized, peribronchial cuffing may be bronchial inflammation  Official radiology report(s): Dg Chest 2 View  Result Date: 10/24/2017 CLINICAL DATA:  Shortness of breath and wheezing for 2 weeks. EXAM: CHEST - 2 VIEW COMPARISON:  Radiograph 02/08/2016 FINDINGS: Left-sided pacemaker in place. Mild cardiomegaly is unchanged. Atherosclerosis of the thoracic aorta. There is peribronchial cuffing. No definite Kerley lines. Subsegmental atelectasis at the left lung base. No confluent airspace disease, pleural effusion or pneumothorax. No acute osseous abnormalities. IMPRESSION: Cardiomegaly. Mild peribronchial cuffing may be bronchial inflammation such as bronchitis or asthma, pulmonary edema felt less likely. Electronically Signed   By: Jeb Levering M.D.   On: 10/24/2017 23:14    ____________________________________________   PROCEDURES  Procedure(s) performed: None  Procedures  Critical Care performed: No  ____________________________________________   INITIAL IMPRESSION / ASSESSMENT AND PLAN / ED COURSE  As part of my medical decision making, I reviewed the  following data within the electronic MEDICAL RECORD NUMBER Notes from prior ED visits and [place] Controlled Substance Database   This is a 67 year old female who comes into the hospital today at the prompting of the after Hours nurse for some lower extremity edema.  My differential diagnosis includes  congestive heart failure, peripheral edema, nephrotic syndrome  The patient did have some blood work drawn to include a CBC BMP troponin BNP.  The patient's BNP is only 31 and her troponin is less than 0.03.  The patient's creatinine is elevated at 2.02 but her baseline is 1.55.  The patient also has no swelling anywhere else aside from in her bilateral feet and ankles.  The patient states that her symptoms had been improving with the increase Lasix and she is walking around without any distress.  I feel that the patient may need to increase her Lasix dose for a few days to help get her back to dry weight and to decrease her edema.  I told the patient that she may need to discuss this as well with her heart doctor but she should take some increased doses of Lasix to help remove the fluid.  The patient is in no distress at this time and is in no pain.  She will be discharged home to follow-up.      ____________________________________________   FINAL CLINICAL IMPRESSION(S) / ED DIAGNOSES  Final diagnoses:  Peripheral edema     ED Discharge Orders    None       Note:  This document was prepared using Dragon voice recognition software and may include unintentional dictation errors.    Loney Hering, MD 10/25/17 [phone]

## 2017-10-27 DIAGNOSIS — R600 Localized edema: Secondary | ICD-10-CM | POA: Diagnosis not present

## 2017-10-27 DIAGNOSIS — E1165 Type 2 diabetes mellitus with hyperglycemia: Secondary | ICD-10-CM | POA: Diagnosis not present

## 2017-10-28 LAB — CUP PACEART REMOTE DEVICE CHECK
Battery Remaining Longevity: 43 mo
Battery Voltage: 2.98 V
Brady Statistic AP VP Percent: 15.64 %
Brady Statistic AP VS Percent: 0.04 %
Brady Statistic RA Percent Paced: 14.53 %
Brady Statistic RV Percent Paced: 95.41 %
Implantable Lead Implant Date: 20141217
Implantable Lead Implant Date: 20141217
Implantable Lead Location: 753859
Implantable Lead Location: 753860
Implantable Lead Model: 5076
Implantable Lead Model: 5076
Implantable Pulse Generator Implant Date: 20141217
Lead Channel Impedance Value: 399 Ohm
Lead Channel Impedance Value: 418 Ohm
Lead Channel Impedance Value: 589 Ohm
Lead Channel Impedance Value: 608 Ohm
Lead Channel Sensing Intrinsic Amplitude: 3.875 mV
Lead Channel Setting Pacing Pulse Width: 0.4 ms
MDC IDC MSMT LEADCHNL RA SENSING INTR AMPL: 3.875 mV
MDC IDC MSMT LEADCHNL RV PACING THRESHOLD AMPLITUDE: 0.5 V
MDC IDC MSMT LEADCHNL RV PACING THRESHOLD PULSEWIDTH: 0.4 ms
MDC IDC MSMT LEADCHNL RV SENSING INTR AMPL: 21.125 mV
MDC IDC MSMT LEADCHNL RV SENSING INTR AMPL: 21.125 mV
MDC IDC SESS DTM: 20190319143323
MDC IDC SET LEADCHNL RA PACING AMPLITUDE: 2 V
MDC IDC SET LEADCHNL RV PACING AMPLITUDE: 2.5 V
MDC IDC SET LEADCHNL RV SENSING SENSITIVITY: 0.9 mV
MDC IDC STAT BRADY AS VP PERCENT: 81.05 %
MDC IDC STAT BRADY AS VS PERCENT: 3.27 %

## 2017-10-30 NOTE — Telephone Encounter (Signed)
Called and spoke to patient. She is transferring care. No longer wishes to talk about this request.

## 2017-11-21 ENCOUNTER — Ambulatory Visit (INDEPENDENT_AMBULATORY_CARE_PROVIDER_SITE_OTHER): Payer: Medicare Other | Admitting: Family Medicine

## 2017-11-21 ENCOUNTER — Encounter: Payer: Self-pay | Admitting: Family Medicine

## 2017-11-21 ENCOUNTER — Ambulatory Visit: Payer: Medicare Other | Admitting: Family Medicine

## 2017-11-21 VITALS — BP 150/53 | HR 66 | Temp 97.8°F | Resp 16 | Ht <= 58 in | Wt 182.0 lb

## 2017-11-21 DIAGNOSIS — R252 Cramp and spasm: Secondary | ICD-10-CM | POA: Diagnosis not present

## 2017-11-21 DIAGNOSIS — E1165 Type 2 diabetes mellitus with hyperglycemia: Secondary | ICD-10-CM

## 2017-11-21 DIAGNOSIS — I442 Atrioventricular block, complete: Secondary | ICD-10-CM | POA: Insufficient documentation

## 2017-11-21 DIAGNOSIS — M810 Age-related osteoporosis without current pathological fracture: Secondary | ICD-10-CM | POA: Diagnosis not present

## 2017-11-21 DIAGNOSIS — G8929 Other chronic pain: Secondary | ICD-10-CM | POA: Diagnosis not present

## 2017-11-21 DIAGNOSIS — M25552 Pain in left hip: Secondary | ICD-10-CM

## 2017-11-21 DIAGNOSIS — F5101 Primary insomnia: Secondary | ICD-10-CM

## 2017-11-21 DIAGNOSIS — N183 Chronic kidney disease, stage 3 unspecified: Secondary | ICD-10-CM

## 2017-11-21 DIAGNOSIS — E1122 Type 2 diabetes mellitus with diabetic chronic kidney disease: Secondary | ICD-10-CM | POA: Diagnosis not present

## 2017-11-21 DIAGNOSIS — I10 Essential (primary) hypertension: Secondary | ICD-10-CM

## 2017-11-21 DIAGNOSIS — IMO0002 Reserved for concepts with insufficient information to code with codable children: Secondary | ICD-10-CM

## 2017-11-21 MED ORDER — CYCLOBENZAPRINE HCL 5 MG PO TABS: 5.0000 mg | ORAL_TABLET | Freq: Every day | ORAL | 2 refills | Status: DC | PRN

## 2017-11-21 MED ORDER — IBANDRONATE SODIUM 150 MG PO TABS: 150.0000 mg | ORAL_TABLET | ORAL | 3 refills | Status: DC

## 2017-11-21 MED ORDER — TRAMADOL HCL 50 MG PO TABS: 50.0000 mg | ORAL_TABLET | Freq: Two times a day (BID) | ORAL | 2 refills | Status: DC

## 2017-11-21 MED ORDER — TEMAZEPAM 30 MG PO CAPS: 30.0000 mg | ORAL_CAPSULE | Freq: Every evening | ORAL | 2 refills | Status: DC | PRN

## 2017-11-21 MED ORDER — GABAPENTIN 300 MG PO CAPS: 300.0000 mg | ORAL_CAPSULE | Freq: Two times a day (BID) | ORAL | 3 refills | Status: DC

## 2017-11-21 NOTE — Patient Instructions (Addendum)
Thank you for coming to the office today.  Refilled all medicines for now except diabetes  I need to review record from Dr Juleen China first to decide on diabetes treatment.  1-2 weeks to get record  Call insurance find cost and coverage of the following  1. Ozempic (Semaglutide injection) - start 0.25mg  weekly for 4 weeks then increase to 0.5mg  weekly - This one has best benefit of weight loss and reducing Cardiovascular events  2. Bydureon BCise (Exenatide ER) - once weekly - this is my preference, very good medicine well tolerated, less side effects of nausea, upset stomach. No dose changes. Cost and coverage is the problem, but we may be able to get it with the coupon card  3. Trulicity (Dulaglutide) - once weekly - this is very good one, usually one of my top choices as well, two doses, 0.75 (likely we would start) and 1.5 max dose. We can use coupon card here too  4. Victoza (Liraglutide) - once DAILY - 3 dose changes 0.6, 1.2 and 1.8, side effects nausea, upset stomach higher on this one but it is still very effective medicine   Please schedule a Follow-up Appointment to: Return in about 1 month (around 12/19/2017) for within 4 weeks revisit diabetes, A1c.  If you have any other questions or concerns, please feel free to call the office or send a message through [facility]. You may also schedule an earlier appointment if necessary.  Additionally, you may be receiving a survey about your experience at our office within a few days to 1 week by e-mail or mail. We value your feedback.  [name], DO [place]

## 2017-11-21 NOTE — Progress Notes (Signed)
Subjective:    Patient ID: Renee Boyd, female    DOB: July 10, 1951, 67 y.o.   MRN: [id number]  Renee Boyd is a 67 y.o. female presenting on 11/21/2017 for Establish Care (swelling, diabeteic meds discussion); Diabetes; and Osteoarthritis (hip)  Here to establish care with new PCP by her request. She is relocating PCP office.  She provides majority of the history, and additional history provided by her son, Renee Boyd, present during exam today.  HPI   Specialists: Cardiology - Dr Virl Axe (Cardiology, EP), pacemaker checks Nephrology - Dr Lavonia Dana [facility]) Podiatry - Dr Daylene Katayama [facility])  CHRONIC DM, Type 2: Reports concerns with no longer on Janumet, see below for additional details concerning her CKD complication. Had a1c inc from 7.1 to 7.7 was following with [facility] - Asking to resume this med CBGs: higher readings >200 on avg off Janumet and Glipizide Meds: (none) - previously on Janumet 100-1000mg  XR, Glipizide 10mg  BID Was tolerating well Currently on ACEi - per renal Denies hypoglycemia, polyuria, visual changes, numbness or tingling.  CKD-III Chronic problem secondary to DM2, HTN, age by chart review. Followed by Dr Juleen China, last visit on 10/20/17. Has had history of fluctuating Cr trend over past 1-2 years, with 1.3 to 2.0. Last reading 2.02 abstracted from lab on 10/24/17, shows Cr 2.02, BUN 35, and GFR calc at 28. Per Renal note, was advised to renal dose all medications, and may discuss with her PCP about resuming metformin as long as GFR >30. Patient requested to resume her previous med of Janumet (100mg  [place], 1000mg  XR metformin), this was discontinued by prior PCP d/t [place] 100mg  is too high dose for her kidney function, reviewed telephone note from 10/23/17. - Today patient asking about resuming Januvia/Janumet  Chronic hip pain, arthritis Left hip and Bursitis R hip Reports chronic problem, previously followed by  Orthopedics and also PCP. Has some radiating pain into groin. Prior treatment with Tramadol 50mg  BID tolerating well allows her to function and ambulate better, previously determined allergy to other opiates - Also has history of leg cramps worse at night, has been taking Gabapentin for OA and hip pain among other chronic pains, taking Gabapentin 300mg  BID, then recently advised to inc dose to 600mg  at bedtime but had side effect of swelling, despite improved symptoms. She would like to reduce this  GERD Had worse acid reflux and this was first trigger for her heart symptoms she report. Now taking Omeprazole 20mg  BID with good results, needs refill  Insomnia Previously followed by [facility], was on chronic Temazepam for >13 years Has continued on this, now has few pills left, needs refill has not been off med in long time, denies having any history of withdrawal symptoms. - She has tried and failed Ambien and Ambien CR in past among other meds when asked.  Permanent Atrial Fibrillation (On anticoagulation), HTN, S/p Pacemaker d/t Complete heart block Brief background history she used to see Dr Humphrey Rolls cardiology after illness in 2006 required cardiac cath eval, ultimately passed out and hospitalized, dx CHB got pacemaker. Reports doing well, has pacer check 1-2 x yearly per Cardiology. S/p pacer medtronic 2014 insertion. Her last ECHO was done in 2017 with normal LVEF but pulmonary HTN. Current Meds: - Eliquis 5mg  BID, Atorvastatin 20mg  daily - Metoprolol-XL 100mg  daily, Amlodipine 10mg , Lisinopril 20mg  daily - Furosemide 40mg  daily (x2 of 20mg  tabs) Reports good compliance, took meds today. Tolerating well, w/o complaints. Denies CP, dyspnea, HA,  edema, dizziness / lightheadedness  History of Gout New problem identified in 11/2016, had in Left hand in finger, treated with steroid dosepak, then had recurrent Dr Amalia Hailey podiatry for injection with gout in foot before. Currently taking Allopurinol  100mg , needs refill, seems to work without recurrent flares. Also improved diet, less red meat   Health Maintenance: Will review previous PCP records. Chart review shows overdue for Mammogram screening.  Depression screen [facility] 2/9 11/21/2017 09/01/2017 12/10/2016  Decreased Interest 0 0 0  Down, Depressed, Hopeless 0 0 0  PHQ - 2 Score 0 0 0  Altered sleeping - - 0  Tired, decreased energy - - 0  Change in appetite - - 3  Feeling bad or failure about yourself  - - 1  Trouble concentrating - - 0  Moving slowly or fidgety/restless - - 0  Suicidal thoughts - - 0  PHQ-9 Score - - 4    Past Medical History:  Diagnosis Date  . Diabetes mellitus without complication ([place])   . GERD (gastroesophageal reflux disease)   . Hypertension   . Pacemaker   . Stroke [facility])    Past Surgical History:  Procedure Laterality Date  . ABDOMINAL HYSTERECTOMY    . CHOLECYSTECTOMY    . TONSILLECTOMY    . TUBAL LIGATION     Social History   Socioeconomic History  . Marital status: Divorced    Spouse name: Not on file  . Number of children: Not on file  . Years of education: Not on file  . Highest education level: Not on file  Occupational History  . Not on file  Social Needs  . Financial resource strain: Not on file  . Food insecurity:    Worry: Not on file    Inability: Not on file  . Transportation needs:    Medical: Not on file    Non-medical: Not on file  Tobacco Use  . Smoking status: Former Smoker    Packs/day: 2.00    Years: 40.00    Pack years: 80.00    Types: Cigarettes    Last attempt to quit: 06/27/2005    Years since quitting: 12.4  . Smokeless tobacco: Never Used  . Tobacco comment: quit Jun 27 2005  Substance and Sexual Activity  . Alcohol use: No  . Drug use: No  . Sexual activity: Never  Lifestyle  . Physical activity:    Days per week: Not on file    Minutes per session: Not on file  . Stress: Not on file  Relationships  . Social connections:    Talks on phone:  Not on file    Gets together: Not on file    Attends religious service: Not on file    Active member of club or organization: Not on file    Attends meetings of clubs or organizations: Not on file    Relationship status: Not on file  . Intimate partner violence:    Fear of current or ex partner: Not on file    Emotionally abused: Not on file    Physically abused: Not on file    Forced sexual activity: Not on file  Other Topics Concern  . Not on file  Social History Narrative  . Not on file   Family History  Problem Relation Age of Onset  . Hypertension Other   . Heart disease Mother        MI  . Alcohol abuse Father   . Autism Son   .  Heart disease Maternal Grandmother   . Cancer Son        colon  . Miscarriages / Stillbirths Maternal Aunt    Current Outpatient Medications on File Prior to Visit  Medication Sig  . allopurinol (ZYLOPRIM) 100 MG tablet Take 1 tablet (100 mg total) daily by mouth.  Marland Kitchen amLODipine (NORVASC) 10 MG tablet TAKE 1 TABLET BY MOUTH DAILY  . apixaban (ELIQUIS) 5 MG TABS tablet Take 1 tablet (5 mg total) by mouth 2 (two) times daily.  Marland Kitchen atorvastatin (LIPITOR) 20 MG tablet Take 1 tablet (20 mg total) by mouth daily.  Marland Kitchen desonide (DESOWEN) 0.05 % lotion Apply 1 application topically as needed.  . furosemide (LASIX) 20 MG tablet Take two tablets (40 mg) every morning  . glucose blood test strip 1 each by Other route 2 (two) times daily. Use as instructed   . levothyroxine (SYNTHROID, LEVOTHROID) 25 MCG tablet Take 1 tablet (25 mcg total) by mouth daily before breakfast.  . lisinopril (PRINIVIL,ZESTRIL) 20 MG tablet Take 20 mg by mouth daily.  . metoprolol succinate (TOPROL-XL) 100 MG 24 hr tablet Take 1 tablet (100 mg total) by mouth daily. Take with or immediately following a meal.  . omeprazole (PRILOSEC) 20 MG capsule TAKE 1 CAPSULE BY MOUTH TWICE A DAY BEFORE A MEAL  . glipiZIDE (GLUCOTROL) 10 MG tablet Take 1 tablet (10 mg total) by mouth 2 (two) times  daily before a meal. (Patient not taking: Reported on 11/21/2017)   No current facility-administered medications on file prior to visit.     Review of Systems Per HPI unless specifically indicated above     Objective:    BP (!) 150/53   Pulse 66   Temp 97.8 F (36.6 C) (Oral)   Resp 16   Ht 4\' 10"  (1.473 m)   Wt 182 lb (82.6 kg)   BMI 38.04 kg/m   Wt Readings from Last 3 Encounters:  11/21/17 182 lb (82.6 kg)  10/24/17 175 lb (79.4 kg)  09/24/17 175 lb 11.2 oz (79.7 kg)    Physical Exam  Constitutional: She is oriented to person, place, and time. She appears well-developed and well-nourished. No distress.  Well-appearing, comfortable, cooperative, obese  HENT:  Head: Normocephalic and atraumatic.  Mouth/Throat: Oropharynx is clear and moist.  Eyes: Conjunctivae are normal. Right eye exhibits no discharge. Left eye exhibits no discharge.  Cardiovascular: Normal rate and intact distal pulses.  Pulmonary/Chest: Effort normal and breath sounds normal. No respiratory distress. She has no wheezes.  Abdominal: Soft.  Musculoskeletal: She exhibits no edema.  Neurological: She is alert and oriented to person, place, and time.  Skin: Skin is warm and dry. No rash noted. She is not diaphoretic. No erythema.  Psychiatric: She has a normal mood and affect. Her behavior is normal.  Well groomed, good eye contact, normal speech and thoughts  Nursing note and vitals reviewed.  Results for orders placed or performed during the hospital encounter of 05/31/24  Basic metabolic panel  Result Value Ref Range   Sodium 137 135 - 145 mmol/L   Potassium 4.3 3.5 - 5.1 mmol/L   Chloride 99 (L) 101 - 111 mmol/L   CO2 26 22 - 32 mmol/L   Glucose, Bld 184 (H) 65 - 99 mg/dL   BUN 35 (H) 6 - 20 mg/dL   Creatinine, Ser 2.02 (H) 0.44 - 1.00 mg/dL   Calcium 9.4 8.9 - 10.3 mg/dL   GFR calc non Af Amer 24 (L) >60 mL/min  GFR calc Af Amer 28 (L) >60 mL/min   Anion gap 12 5 - 15  CBC  Result Value  Ref Range   WBC 7.4 3.6 - 11.0 K/uL   RBC 4.17 3.80 - 5.20 MIL/uL   Hemoglobin 11.9 (L) 12.0 - 16.0 g/dL   HCT 37.1 35.0 - 47.0 %   MCV 89.0 80.0 - 100.0 fL   MCH 28.4 26.0 - 34.0 pg   MCHC 32.0 32.0 - 36.0 g/dL   RDW 15.5 (H) 11.5 - 14.5 %   Platelets 233 150 - 440 K/uL  Troponin I  Result Value Ref Range   Troponin I <0.03 <0.03 ng/mL  Glucose, capillary  Result Value Ref Range   Glucose-Capillary 178 (H) 65 - 99 mg/dL  Brain natriuretic peptide  Result Value Ref Range   B Natriuretic Peptide 31.0 0.0 - 100.0 pg/mL      Assessment & Plan:   Problem List Items Addressed This Visit    Chronic hip pain    Stable chronic problem Related to OA/DJD Prior Ortho Now controlled on Tramadol 50mg  BID most days, agree to refill since nearly out and transitioning care to new PCP, we will review alternative options in future. But she is already on Gabapentin. - Need to continue renal dose medications - Reduced gabapentin down from 300-600mg  back to 300-300mg  and may add back flexeril lower dose 5mg  nightly PRN      Relevant Medications   cyclobenzaprine (FLEXERIL) 5 MG tablet   gabapentin (NEURONTIN) 300 MG capsule   traMADol (ULTRAM) 50 MG tablet   Complete heart block (HCC)    Stable chronic problem s/p pacemaker Followed by Cardiology      Essential hypertension, benign    Still elevated BP Complication with CKD-III, CHB pacer, AFib    Plan:  1. Continue current BP regimen - Metoprolol-XL 100mg  daily, Amlodipine 10mg , Lisinopril 20mg  daily - Furosemide 40mg  daily (x2 of 20mg  tabs) per Cardiology 2. Encourage improved lifestyle - low sodium diet, regular exercise 3. Continue monitor BP outside office, bring readings to next visit, if persistently >140/90 or new symptoms notify office sooner      Muscle cramps at night    Reduce gabapentin from 300 to 600mg  due to intolerance side effect swelling back to Gabapentin 300mg  BID, new rx sent - Resume flexeril 5mg  nightly PRN  for cramps as well, caution renal dosing - ultimately this may not be long term solution, but she has tolerated it currently and is still taking it, did not change today      Relevant Medications   gabapentin (NEURONTIN) 300 MG capsule   Osteoporosis without current pathological fracture   Relevant Medications   ibandronate (BONIVA) 150 MG tablet   Primary insomnia    Stable chronic insomnia for >10-13 years Prior PCP had her on Restoril with good results, multiple other med failures (Ambien, Ambien CR, SSRI) - Given she is nearly out of med, new visit today, agree to refill to avoid potential withdrawal or side effects, discussed safety concerns and risks on this medication, and agree refill today, may consider other options in future      Relevant Medications   temazepam (RESTORIL) 30 MG capsule   Stage 3 chronic kidney disease ([place]) - Primary    Seems gradual worsening decline in GFR now < 30 on last 10/2017 lab reviewed, with elevated Cr to 2.0, prior trend 1.3 to 1.8 - secondary to HTN, DM, age  Plan See A&P under Diabetes for  discussion on renal dosing meds - Continue f/u with Nephrology, renal dosing all meds is goal, remains on ACEi, lasix      Uncontrolled type 2 diabetes mellitus with chronic kidney disease, without long-term current use of insulin (HCC)    Previously near goal A1c < 8, however had been better controlled near 7 Problem with medications recently due to worse CKD, taken off Janumet, and was on Glipizide now out and risk of hypoglycemia Complications - CKD-III  Plan:  1. Long discussion today regarding DM medications - She is requesting refill janumet again, I have told her that I am not comfortable making this change, will need to review chart. I advised she may take Glipizide as she was for now temporarily until we can determine if Janumet is safe, and also may contact Dr Juleen China to review this - Additionally I offered her review of GLP1 med list as this would  be BEST option for patient with advanced CKD and diabetes for control, she would discontinue Sulfonylurea and Janumet  (updated after visit) - Upon further review previously did well on Janumet 100-1000mg XR but taken off by prior PCP due to her decline in renal function since Januvia was not renally dosed, would need to be 25mg  daily max, last note per renal Dr Juleen China from 10/2017 requested renal dose meds, does not recommend januvia or janumet but asks for PCP to consider metformin resume if GFR >30, now lab from 10/2017 shows Cr inc to 2 and GFR is < 30 - I do not recommend Janumet as previously dosed, will review with Dr Juleen China, it may be possible to prescribe Januvia 25mg  daily and Metformin low dose, and I would discontinue Glipizide going forward, but ultimately best option is still GLP1 - will review again at up coming visit  2. Encourage improved lifestyle - low carb, low sugar diet, reduce portion size, continue improving regular exercise 3. Check CBG, bring log to next visit for review 4. Continue ACEi , Statin 5. Follow-up upon further review including labs within 2-4 weeks pending chart review - A1c med adjust         Meds ordered this encounter  Medications  . ibandronate (BONIVA) 150 MG tablet    Sig: Take 1 tablet (150 mg total) by mouth every 30 (thirty) days. Take in the morning with a full glass of water, on an empty stomach    Dispense:  3 tablet    Refill:  3  . cyclobenzaprine (FLEXERIL) 5 MG tablet    Sig: Take 1 tablet (5 mg total) by mouth daily as needed for muscle spasms (hip pain).    Dispense:  30 tablet    Refill:  2  . gabapentin (NEURONTIN) 300 MG capsule    Sig: Take 1 capsule (300 mg total) by mouth 2 (two) times daily.    Dispense:  180 capsule    Refill:  3    Dose reduction  . temazepam (RESTORIL) 30 MG capsule    Sig: Take 1 capsule (30 mg total) by mouth at bedtime as needed for sleep.    Dispense:  30 capsule    Refill:  2  . traMADol (ULTRAM) 50  MG tablet    Sig: Take 1 tablet (50 mg total) by mouth 2 (two) times daily.    Dispense:  60 tablet    Refill:  2    Follow up plan: Return in about 1 month (around 12/19/2017) for within 4 weeks revisit diabetes, A1c.  Sheppard Coil  [place] Ranger, [facility] 11/22/2017, 12:05 AM

## 2017-11-21 NOTE — Assessment & Plan Note (Signed)
Previously near goal A1c < 8, however had been better controlled near 7 Problem with medications recently due to worse CKD, taken off Janumet, and was on Glipizide now out and risk of hypoglycemia Complications - CKD-III  Plan:  1. Long discussion today regarding DM medications - She is requesting refill janumet again, I have told her that I am not comfortable making this change, will need to review chart. I advised she may take Glipizide as she was for now temporarily until we can determine if Janumet is safe, and also may contact Dr Juleen China to review this - Additionally I offered her review of GLP1 med list as this would be BEST option for patient with advanced CKD and diabetes for control, she would discontinue Sulfonylurea and Janumet  (updated after visit) - Upon further review previously did well on Janumet 100-1000mg XR but taken off by prior PCP due to her decline in renal function since Januvia was not renally dosed, would need to be 25mg  daily max, last note per renal Dr Juleen China from 10/2017 requested renal dose meds, does not recommend januvia or janumet but asks for PCP to consider metformin resume if GFR >30, now lab from 10/2017 shows Cr inc to 2 and GFR is < 30 - I do not recommend Janumet as previously dosed, will review with Dr Juleen China, it may be possible to prescribe Januvia 25mg  daily and Metformin low dose, and I would discontinue Glipizide going forward, but ultimately best option is still GLP1 - will review again at up coming visit  2. Encourage improved lifestyle - low carb, low sugar diet, reduce portion size, continue improving regular exercise 3. Check CBG, bring log to next visit for review 4. Continue ACEi , Statin 5. Follow-up upon further review including labs within 2-4 weeks pending chart review - A1c med adjust

## 2017-11-21 NOTE — Assessment & Plan Note (Addendum)
Still elevated BP Complication with CKD-III, CHB pacer, AFib    Plan:  1. Continue current BP regimen - Metoprolol-XL 100mg  daily, Amlodipine 10mg , Lisinopril 20mg  daily - Furosemide 40mg  daily (x2 of 20mg  tabs) per Cardiology 2. Encourage improved lifestyle - low sodium diet, regular exercise 3. Continue monitor BP outside office, bring readings to next visit, if persistently >140/90 or new symptoms notify office sooner

## 2017-11-22 DIAGNOSIS — M810 Age-related osteoporosis without current pathological fracture: Secondary | ICD-10-CM | POA: Insufficient documentation

## 2017-11-22 NOTE — Assessment & Plan Note (Signed)
Seems gradual worsening decline in GFR now < 30 on last 10/2017 lab reviewed, with elevated Cr to 2.0, prior trend 1.3 to 1.8 - secondary to HTN, DM, age  Plan See A&P under Diabetes for discussion on renal dosing meds - Continue f/u with Nephrology, renal dosing all meds is goal, remains on ACEi, lasix

## 2017-11-22 NOTE — Assessment & Plan Note (Signed)
Stable chronic problem s/p pacemaker Followed by Cardiology 

## 2017-11-22 NOTE — Assessment & Plan Note (Signed)
Stable chronic insomnia for >10-13 years Prior PCP had her on Restoril with good results, multiple other med failures (Ambien, Ambien CR, SSRI) - Given she is nearly out of med, new visit today, agree to refill to avoid potential withdrawal or side effects, discussed safety concerns and risks on this medication, and agree refill today, may consider other options in future

## 2017-11-22 NOTE — Assessment & Plan Note (Addendum)
Reduce gabapentin from 300 to 600mg  due to intolerance side effect swelling back to Gabapentin 300mg  BID, new rx sent - Resume flexeril 5mg  nightly PRN for cramps as well, caution renal dosing - ultimately this may not be long term solution, but she has tolerated it currently and is still taking it, did not change today

## 2017-11-22 NOTE — Assessment & Plan Note (Signed)
Stable chronic problem Related to OA/DJD Prior Ortho Now controlled on Tramadol 50mg  BID most days, agree to refill since nearly out and transitioning care to new PCP, we will review alternative options in future. But she is already on Gabapentin. - Need to continue renal dose medications - Reduced gabapentin down from 300-600mg  back to 300-300mg  and may add back flexeril lower dose 5mg  nightly PRN

## 2017-11-24 ENCOUNTER — Telehealth: Payer: Self-pay | Admitting: Family Medicine

## 2017-11-24 NOTE — Telephone Encounter (Signed)
See initial new patient visit note from 11/21/17 for full background information.  Specific issues now:  1. Flexeril 5mg  - received PA form to complete, now it seems this has been a formulary change through insurance, this is no longer covered, requesting details on her clinical history to approve med, I called [place] and it is not a quantity issue. However regardless, this med and other muscle relaxants are high risk for any patient with advanced renal insufficiency. She would need close monitoring on low doses of these meds. - We will need to switch med to possibly Baclofen to see if it is covered, 2.5mg  dose at bedtime - renally dosed  For now no change to med - and waiting to review with Dr Juleen China before completing PA form.  2. Januvia / Metformin  - I do not recommend Janumet as previously dosed, will review with Dr Juleen China, it may be possible to prescribe Januvia 25mg  daily and Metformin low dose, and I would discontinue Glipizide going forward, but ultimately best option is still GLP1 - will review again at up coming visit.  I attempted to contact Dr Juleen China today 11/24/17, will review with him further later this week, he is on call at hospital currently. Will set time to call him and review within next 1-3 days to determine which meds are safe and appropriate for this Renal patient.  [name], [facility] 11/24/2017, 5:15 PM

## 2017-11-26 NOTE — Telephone Encounter (Signed)
UPDATE 11/26/17:  I have called patient's nephrologist Dr Juleen China ([place]) today. Spoke directly with him this afternoon after 2pm. We reviewed her recent office visit with me and our concerns on the diabetic medications, Janumet (Januvia/Metformin) and Glipizide. He last saw her in March 2019, and he states her Cr has been better and usually range 1.3 to 1.5, he was not fully aware of the last result Cr 2.02 in 3/22, as he had checked it on 3/18 recently and had a better result around 1.2 approximately. He is concerned now with the fluctuation of the Cr trend, with GFR now < 30 on last check. His next apt with her was June 2019. He agrees with my concerns that Janumet 100-1000mg  is not safe or appropriately renal dosing for her current CKD condition. I suggested renal dose of max Januvia at 25mg  daily as a potential option, and do have my concerns with regards to Metformin. Primarily my recommendation is for GLP1 medication as this is safest in CKD, and would control her DM in place of Janumet and Glipizide. He agrees, and also offers if patient requests maybe Endocrinology referral is warranted to find appropriate selection of medicine for her. I requested that their office contact patient sooner to try to schedule a follow-up sooner than 01/2018 and re-check blood work for Cr trend.  I then called patient to review this update with her: - She now understands better it seems my concerns about her kidney function, and will discuss this and the lab results with Dr Juleen China next, and stay tuned for sooner apt and re-check chemistry blood test - She has questions about her CKD and swelling, and is concerned about what is causing it, and we discussed Furosemide may possibly be helping swelling but contributing to CKD - I advised her I do not think Glipizide was causing swelling or damaging kidney, but it is not ideal regimen for her DM - She will consider the GLP1 once weekly injection medications as a next  option - Plan to return to see me as scheduled in May 2019 for DM visit A1c check, and determine med adjust if need, preferably after she sees Dr Juleen China again  Additional update - regarding Flexeril 5mg  BID - was recommended to take nightly PRN now instead of higher dose gabapentin. We submitted rx it was denied due to not preferred med ,do not have available list of preferred alternatives. Also concern with renally dosing med. She has contacted insurance and she reports that it will now be approved, after we received denial letter on 11/24/17.  [name], [facility] 11/26/2017, 3:55 PM

## 2017-12-05 ENCOUNTER — Ambulatory Visit (INDEPENDENT_AMBULATORY_CARE_PROVIDER_SITE_OTHER): Payer: Medicare Other | Admitting: Podiatry

## 2017-12-05 ENCOUNTER — Ambulatory Visit (INDEPENDENT_AMBULATORY_CARE_PROVIDER_SITE_OTHER): Payer: Medicare Other

## 2017-12-05 ENCOUNTER — Ambulatory Visit: Payer: Medicare Other | Admitting: Family Medicine

## 2017-12-05 ENCOUNTER — Encounter: Payer: Self-pay | Admitting: Podiatry

## 2017-12-05 DIAGNOSIS — M7752 Other enthesopathy of left foot: Secondary | ICD-10-CM

## 2017-12-05 DIAGNOSIS — M779 Enthesopathy, unspecified: Secondary | ICD-10-CM | POA: Diagnosis not present

## 2017-12-05 DIAGNOSIS — M10472 Other secondary gout, left ankle and foot: Secondary | ICD-10-CM

## 2017-12-08 DIAGNOSIS — I129 Hypertensive chronic kidney disease with stage 1 through stage 4 chronic kidney disease, or unspecified chronic kidney disease: Secondary | ICD-10-CM | POA: Diagnosis not present

## 2017-12-08 DIAGNOSIS — E875 Hyperkalemia: Secondary | ICD-10-CM | POA: Diagnosis not present

## 2017-12-08 DIAGNOSIS — E1122 Type 2 diabetes mellitus with diabetic chronic kidney disease: Secondary | ICD-10-CM | POA: Diagnosis not present

## 2017-12-08 DIAGNOSIS — N184 Chronic kidney disease, stage 4 (severe): Secondary | ICD-10-CM | POA: Diagnosis not present

## 2017-12-08 NOTE — Progress Notes (Signed)
   HPI: 67 year old female presenting today with a chief complaint of pain to the left ankle and dorsum of the left foot that began three days ago. She reports associated redness and swelling. She has a history of gout and is concerned for a flare up. She states the pain radiates up her left leg. She has not done anything for treatment. Touching the area and walking increases the pain. Patient is here for further evaluation and treatment.   Past Medical History:  Diagnosis Date  . Diabetes mellitus without complication ([place])   . GERD (gastroesophageal reflux disease)   . Hypertension   . Pacemaker   . Stroke [facility])      Physical Exam: General: The patient is alert and oriented x3 in no acute distress.  Dermatology: Skin is warm, dry and supple bilateral lower extremities. Negative for open lesions or macerations.  Vascular: Palpable pedal pulses bilaterally. Capillary refill within normal limits.  Neurological: Epicritic and protective threshold grossly intact bilaterally.   Musculoskeletal Exam: Pain on palpation to the left ankle and 1st MPJ of the left foot with erythema and edema. Range of motion within normal limits to all pedal and ankle joints bilateral. Muscle strength 5/5 in all groups bilateral.    Radiographic Exam:  Normal osseous mineralization. Joint spaces preserved. No fracture/dislocation/boney destruction.    Assessment: 1. Acute gout left ankle and 1st MPJ left   Plan of Care:  1. Patient evaluated. X-Rays reviewed.  2. Injection of 0.5 mLs Celestone Soluspan injected into the left ankle joint.  3. Injection of 0.5 mLs Celestone Soluspan injected into the 1st MPJ of the left foot.  4. Patient does not want oral medications at this time.  5. Return to clinic in 4 weeks.       Edrick Kins, DPM [facility]  Dr. Edrick Kins, DPM    [address], [place] [address]                Office  [phone]  Fax [phone]

## 2017-12-09 ENCOUNTER — Telehealth: Payer: Self-pay | Admitting: Family Medicine

## 2017-12-09 NOTE — Telephone Encounter (Addendum)
Pt needs 90 day refills on allopurinol and levothyroxine sent to [facility].  Her call back is [phone]

## 2017-12-10 ENCOUNTER — Other Ambulatory Visit: Payer: Self-pay

## 2017-12-10 DIAGNOSIS — E790 Hyperuricemia without signs of inflammatory arthritis and tophaceous disease: Secondary | ICD-10-CM

## 2017-12-10 DIAGNOSIS — E039 Hypothyroidism, unspecified: Secondary | ICD-10-CM

## 2017-12-10 MED ORDER — ALLOPURINOL 100 MG PO TABS: 100.0000 mg | ORAL_TABLET | Freq: Every day | ORAL | 1 refills | Status: DC

## 2017-12-10 MED ORDER — LEVOTHYROXINE SODIUM 25 MCG PO TABS
25.0000 ug | ORAL_TABLET | Freq: Every day | ORAL | 1 refills | Status: DC
Start: 2017-12-10 — End: 2018-06-03

## 2017-12-10 NOTE — Telephone Encounter (Signed)
Send for approval.

## 2017-12-11 ENCOUNTER — Telehealth: Payer: Self-pay | Admitting: Podiatry

## 2017-12-11 NOTE — Telephone Encounter (Signed)
I saw Dr. Amalia Hailey last Friday, 03 May. I would like to speak to the nurse about getting a medication called in for me tomorrow from Dr. Amalia Hailey. He talked to me about it and I wanted to know if I could get it called in tomorrow. Thank you ma'am. My number is [phone].

## 2017-12-11 NOTE — Telephone Encounter (Signed)
Returned patient call, she stated that she is seeing her kidney doctor tomorrow and wanted to know if he would send her in something for the pain she is having.  I informed her that she can ask the kidney doctor for something, but if he declines giving her something, then she understands to call our office back and she can request something for pain from Dr. Amalia Hailey.   She verbally understood

## 2017-12-12 DIAGNOSIS — E1122 Type 2 diabetes mellitus with diabetic chronic kidney disease: Secondary | ICD-10-CM | POA: Diagnosis not present

## 2017-12-12 DIAGNOSIS — D631 Anemia in chronic kidney disease: Secondary | ICD-10-CM | POA: Diagnosis not present

## 2017-12-12 DIAGNOSIS — N183 Chronic kidney disease, stage 3 (moderate): Secondary | ICD-10-CM | POA: Diagnosis not present

## 2017-12-12 DIAGNOSIS — I129 Hypertensive chronic kidney disease with stage 1 through stage 4 chronic kidney disease, or unspecified chronic kidney disease: Secondary | ICD-10-CM | POA: Diagnosis not present

## 2017-12-12 DIAGNOSIS — M109 Gout, unspecified: Secondary | ICD-10-CM | POA: Diagnosis not present

## 2017-12-16 ENCOUNTER — Ambulatory Visit: Payer: Medicare Other | Admitting: Unknown Physician Specialty

## 2017-12-19 ENCOUNTER — Encounter: Payer: Self-pay | Admitting: Family Medicine

## 2017-12-19 ENCOUNTER — Ambulatory Visit (INDEPENDENT_AMBULATORY_CARE_PROVIDER_SITE_OTHER): Payer: Medicare Other | Admitting: Family Medicine

## 2017-12-19 VITALS — BP 148/62 | HR 62 | Temp 97.7°F | Resp 16 | Ht <= 58 in | Wt 178.0 lb

## 2017-12-19 DIAGNOSIS — E790 Hyperuricemia without signs of inflammatory arthritis and tophaceous disease: Secondary | ICD-10-CM

## 2017-12-19 DIAGNOSIS — E1165 Type 2 diabetes mellitus with hyperglycemia: Secondary | ICD-10-CM | POA: Diagnosis not present

## 2017-12-19 DIAGNOSIS — E1122 Type 2 diabetes mellitus with diabetic chronic kidney disease: Secondary | ICD-10-CM | POA: Diagnosis not present

## 2017-12-19 DIAGNOSIS — N183 Chronic kidney disease, stage 3 unspecified: Secondary | ICD-10-CM

## 2017-12-19 DIAGNOSIS — R252 Cramp and spasm: Secondary | ICD-10-CM

## 2017-12-19 DIAGNOSIS — IMO0002 Reserved for concepts with insufficient information to code with codable children: Secondary | ICD-10-CM

## 2017-12-19 LAB — POCT GLYCOSYLATED HEMOGLOBIN (HGB A1C): HEMOGLOBIN A1C: 10.5 — AB (ref ?–5.7)

## 2017-12-19 MED ORDER — BACLOFEN 5 MG PO TABS
2.5000 mg | ORAL_TABLET | Freq: Two times a day (BID) | ORAL | 2 refills | Status: DC | PRN
Start: 2017-12-19 — End: 2018-03-24

## 2017-12-19 MED ORDER — SITAGLIPTIN PHOS-METFORMIN HCL 50-500 MG PO TABS: 1.0000 | ORAL_TABLET | Freq: Two times a day (BID) | ORAL | 1 refills | Status: DC

## 2017-12-19 NOTE — Assessment & Plan Note (Signed)
Improved Cr by report from Nephrology Dr Juleen China 5/10, reviewed outside chart note today She has been cleared to restart janumet per Nephro Secondary CKD due to HTN, DM Age Also had AoCKD recently from edema  Plan Follow-up w/ Nephrology Check BMET Cr trend in 3 months

## 2017-12-19 NOTE — Assessment & Plan Note (Signed)
Uncontrolled DM with A1c 10.5 (previously 7-7.7 on prior meds) Remains off Janumet d/t renal function, now recovered from AoCKD Complications - CKD-III  Plan:  1. RESTART Janumet - switch from XR to BID - 50/500mg  1 tab BID - after she was cleared by Nephrology to resume this, unless GFR significantly < 30 consistently - Discussion again on advantages of GLP - especially in renal patient, and she is not interested in injectable medication at this time, showed her demo, and she declines, still has difficulty understanding that GLP is not hypoglycemic agent it is NOT insulin  2. Encourage improved lifestyle - low carb, low sugar diet, reduce portion size, continue improving regular exercise 3. Check CBG, bring log to next visit for review 4. Continue ACEi, Statin 5. DM Foot exam done today / Advised to schedule DM ophtho exam, send record 6. Follow-up 3 months DM A1c - med adjust

## 2017-12-19 NOTE — Assessment & Plan Note (Signed)
Continue Gabapentin 300mg  BID Switch Flexeril to Baclofen 2.5 to 5mg  BID PRN - likely only one at night

## 2017-12-19 NOTE — Patient Instructions (Addendum)
Thank you for coming to the office today.  Restart Janumet - 50/500mg  - take one pill TWICE a day  Keep following up with Dr Juleen China as planned and follow with kidney function  STOP Glipizide  In future may need the injectable medicine if kidneys not improved.  DUE for FASTING BLOOD WORK (no food or drink after midnight before the lab appointment, only water or coffee without cream/sugar on the morning of)  SCHEDULE "Lab Only" visit in the morning at the clinic for lab draw in 3 MONTHS   - Make sure Lab Only appointment is at about 1 week before your next appointment, so that results will be available  For Lab Results, once available within 2-3 days of blood draw, you can can log in to MyChart online to view your results and a brief explanation. Also, we can discuss results at next follow-up visit.   Please schedule a Follow-up Appointment to: Return in about 3 months (around 03/21/2018) for DM A1c, CKD, lab review.  If you have any other questions or concerns, please feel free to call the office or send a message through [place]. You may also schedule an earlier appointment if necessary.  Additionally, you may be receiving a survey about your experience at our office within a few days to 1 week by e-mail or mail. We value your feedback.  [name], DO [place]

## 2017-12-19 NOTE — Progress Notes (Signed)
Subjective:    Patient ID: Renee Boyd, female    DOB: Sep 21, 1950, 67 y.o.   MRN: [id number]  Renee Boyd is a 67 y.o. female presenting on 12/19/2017 for Diabetes   HPI   Specialists: Cardiology - Dr Virl Axe (Cardiology, EP), pacemaker checks Nephrology - Dr Lavonia Dana ([place]) Podiatry - Dr Daylene Katayama [facility])  CHRONIC DM, Type 2: Reports concerns with no longer on Janumet had worsening A1c, previous lab A1c 7.1 to 7.7, then was taken off Janumet by prior PCP due to concern safety w/ CKD and AKI. She did not do well on Glipizide had high sugars. - See prior discussion in interval - Now she has returned to Nephrology saw Dr Juleen China 5/10 - reviewed chart, and he recommended could resume the Janumet due to improved kidney function, had AoCKD with cardiorenal - She is not interested in any injectable medicine at this time, including GLP, she is afraid of hypoglycemia. CBGs: higher readings >200 on avg off Janumet and Glipizide Meds: Glipizide 10mg  BID Was tolerating well Currently on ACEi - per renal - She declines a 2nd opinion from Endocrinology Lifestyle - Plans to improve walking more for exercise Denies hypoglycemia, polyuria, visual changes, numbness or tingling.  CKD-III Chronic problem secondary to DM2, HTN, age by chart review. Followed by Dr Juleen China, last visit on 12/12/17, interval we had discussed her case with AoCKD Cr up to 2, now she returned to review if safe to resume her Janumet DM med - was advised that she may resume it and Cr had improved, thought to be elevated due to AoCKD with edema possible cardiorenal syndrome - Today patient has no new concerns, edema is improved  Complete Heart Block / S/p Pacer - Followed by Cardiology Dr Caryl Comes  Gout / Hyperuricemia Followed by Dr Amalia Hailey [facility] Podiatry seen recently for foot injection due to gout, limited improvement, had redness swelling acute pain, now gradually improving. Nephrology put her on a 5  day course Prednisone  Chronic Muscle Spasms, nightly Reduced her Gabapenitn, now taking 300mg  BID She did not pursue Flexeirl she got one rx filled cash pay since it was not covered due to age restriction - Agree to switch to Baclofen  HM Offered Mammogram screening, she declines, states she has not had Mammogram in 20 years and does not intend to do this.   Depression screen [facility] 2/9 12/19/2017 11/21/2017 09/01/2017  Decreased Interest 0 0 0  Down, Depressed, Hopeless 0 0 0  PHQ - 2 Score 0 0 0  Altered sleeping - - -  Tired, decreased energy - - -  Change in appetite - - -  Feeling bad or failure about yourself  - - -  Trouble concentrating - - -  Moving slowly or fidgety/restless - - -  Suicidal thoughts - - -  PHQ-9 Score - - -    Social History   Tobacco Use  . Smoking status: Former Smoker    Packs/day: 2.00    Years: 40.00    Pack years: 80.00    Types: Cigarettes    Last attempt to quit: 06/27/2005    Years since quitting: 12.4  . Smokeless tobacco: Former Systems developer  . Tobacco comment: quit Jun 27 2005  Substance Use Topics  . Alcohol use: No  . Drug use: No    Review of Systems Per HPI unless specifically indicated above     Objective:    BP (!) 148/62   Pulse 62  Temp 97.7 F (36.5 C) (Oral)   Resp 16   Ht 4\' 10"  (1.473 m)   Wt 178 lb (80.7 kg)   BMI 37.20 kg/m   Wt Readings from Last 3 Encounters:  12/19/17 178 lb (80.7 kg)  11/21/17 182 lb (82.6 kg)  10/24/17 175 lb (79.4 kg)    Physical Exam  Constitutional: She is oriented to person, place, and time. She appears well-developed and well-nourished. No distress.  Well-appearing, comfortable, cooperative, obese  HENT:  Head: Normocephalic and atraumatic.  Mouth/Throat: Oropharynx is clear and moist.  Eyes: Conjunctivae are normal. Right eye exhibits no discharge. Left eye exhibits no discharge.  Neck: Normal range of motion. Neck supple. No thyromegaly present.  Cardiovascular: Normal rate,  regular rhythm, normal heart sounds and intact distal pulses.  No murmur heard. Pulmonary/Chest: Effort normal and breath sounds normal. No respiratory distress. She has no wheezes. She has no rales.  Musculoskeletal: Normal range of motion. She exhibits no edema.  Bilateral feet with hallux valgus deformity L>R, bunion formation  L foot without significant erythema or edema over 1st MTP, seems resolved.  Lymphadenopathy:    She has no cervical adenopathy.  Neurological: She is alert and oriented to person, place, and time.  Skin: Skin is warm and dry. No rash noted. She is not diaphoretic. No erythema.  Psychiatric: She has a normal mood and affect. Her behavior is normal.  Well groomed, good eye contact, normal speech and thoughts  Nursing note and vitals reviewed.  Recent Labs    12/19/17 1559  HGBA1C 10.5*   Diabetic Foot Exam - Simple   Simple Foot Form Diabetic Foot exam was performed with the following findings:  Yes 12/19/2017 11:30 AM  Visual Inspection See comments:  Yes Sensation Testing Intact to touch and monofilament testing bilaterally:  Yes Pulse Check Posterior Tibialis and Dorsalis pulse intact bilaterally:  Yes Comments Bilateral hallux valgus with bunion formation, L>R     Results for orders placed or performed in visit on 12/19/17  POCT HgB A1C  Result Value Ref Range   Hemoglobin A1C 10.5 (A) 5.7      Assessment & Plan:   Problem List Items Addressed This Visit    Hyperuricemia    Recent gout flare L foot podagra, improved from oral steroids, failed injection from podiatry Check Uric acid in 3 months Continue Allopurinol Follow-up      Relevant Orders   Uric acid   Muscle cramps at night    Continue Gabapentin 300mg  BID Switch Flexeril to Baclofen 2.5 to 5mg  BID PRN - likely only one at night      Relevant Medications   baclofen 5 MG TABS   Stage 3 chronic kidney disease ([place])    Improved Cr by report from Nephrology Dr Juleen China 5/10,  reviewed outside chart note today She has been cleared to restart janumet per Nephro Secondary CKD due to HTN, DM Age Also had AoCKD recently from edema  Plan Follow-up w/ Nephrology Check BMET Cr trend in 3 months      Relevant Orders   BASIC METABOLIC PANEL WITH GFR   Uncontrolled type 2 diabetes mellitus with chronic kidney disease, without long-term current use of insulin ([place]) - Primary    Uncontrolled DM with A1c 10.5 (previously 7-7.7 on prior meds) Remains off Janumet d/t renal function, now recovered from AoCKD Complications - CKD-III  Plan:  1. RESTART Janumet - switch from XR to BID - 50/500mg  1 tab BID - after she  was cleared by Nephrology to resume this, unless GFR significantly < 30 consistently - Discussion again on advantages of GLP - especially in renal patient, and she is not interested in injectable medication at this time, showed her demo, and she declines, still has difficulty understanding that GLP is not hypoglycemic agent it is NOT insulin  2. Encourage improved lifestyle - low carb, low sugar diet, reduce portion size, continue improving regular exercise 3. Check CBG, bring log to next visit for review 4. Continue ACEi, Statin 5. DM Foot exam done today / Advised to schedule DM ophtho exam, send record 6. Follow-up 3 months DM A1c - med adjust       Relevant Medications   sitaGLIPtin-metformin (JANUMET) 50-500 MG tablet   Other Relevant Orders   POCT HgB A1C (Completed)   Hemoglobin A1c      Meds ordered this encounter  Medications  . sitaGLIPtin-metformin (JANUMET) 50-500 MG tablet    Sig: Take 1 tablet by mouth 2 (two) times daily with a meal.    Dispense:  180 tablet    Refill:  1  . baclofen 5 MG TABS    Sig: Take 2.5-5 mg by mouth 2 (two) times daily as needed for muscle spasms.    Dispense:  30 tablet    Refill:  2    Follow up plan: Return in about 3 months (around 03/21/2018) for DM A1c, CKD, lab review.  Future labs ordered for  03/23/18 - BMET, A1c, uric acid  [name], DO [facility] 12/19/2017, 4:05 PM

## 2017-12-19 NOTE — Assessment & Plan Note (Signed)
Recent gout flare L foot podagra, improved from oral steroids, failed injection from podiatry Check Uric acid in 3 months Continue Allopurinol Follow-up

## 2018-01-02 ENCOUNTER — Ambulatory Visit (INDEPENDENT_AMBULATORY_CARE_PROVIDER_SITE_OTHER): Payer: Medicare Other | Admitting: Podiatry

## 2018-01-02 ENCOUNTER — Encounter: Payer: Self-pay | Admitting: Podiatry

## 2018-01-02 DIAGNOSIS — M659 Synovitis and tenosynovitis, unspecified: Secondary | ICD-10-CM

## 2018-01-02 DIAGNOSIS — M779 Enthesopathy, unspecified: Secondary | ICD-10-CM | POA: Diagnosis not present

## 2018-01-02 DIAGNOSIS — M7752 Other enthesopathy of left foot: Secondary | ICD-10-CM

## 2018-01-05 DIAGNOSIS — H40003 Preglaucoma, unspecified, bilateral: Secondary | ICD-10-CM | POA: Diagnosis not present

## 2018-01-05 LAB — HM DIABETES EYE EXAM

## 2018-01-05 NOTE — Progress Notes (Signed)
   HPI: 67 year old female presenting today for follow up evaluation of an acute gout exacerbation of the left 1st MPJ and ankle. She states the pain had improved until yesterday. She reports pain and associated swelling. She recently finished a Medrol Dose Pak prescribed by her PCP. Walking and touching the areas increases the pain. Patient is here for further evaluation and treatment.   Past Medical History:  Diagnosis Date  . Diabetes mellitus without complication ([place])   . GERD (gastroesophageal reflux disease)   . Hypertension   . Pacemaker   . Stroke [facility])      Physical Exam: General: The patient is alert and oriented x3 in no acute distress.  Dermatology: Skin is warm, dry and supple bilateral lower extremities. Negative for open lesions or macerations.  Vascular: Palpable pedal pulses bilaterally. Capillary refill within normal limits.  Neurological: Epicritic and protective threshold grossly intact bilaterally.   Musculoskeletal Exam: Pain on palpation to the left ankle and 1st MPJ of the left foot with erythema and edema. Range of motion within normal limits to all pedal and ankle joints bilateral. Muscle strength 5/5 in all groups bilateral.    Assessment: 1. Acute gout left ankle and 1st MPJ left   Plan of Care:  1. Patient evaluated. X-Rays reviewed.  2. Injection of 0.5 mLs Celestone Soluspan injected into the left ankle joint.  3. Injection of 0.5 mLs Celestone Soluspan injected into the 1st MPJ of the left foot.  4. Patient took a Medrol Dose Pak from PCP.   5. Return to clinic in 3 months for routine care.       Edrick Kins, DPM [facility]  Dr. Edrick Kins, DPM    [address], [place] [address]                Office [phone]  Fax [phone]

## 2018-01-06 ENCOUNTER — Telehealth: Payer: Self-pay | Admitting: Family Medicine

## 2018-01-06 ENCOUNTER — Other Ambulatory Visit: Payer: Self-pay

## 2018-01-06 NOTE — Telephone Encounter (Signed)
Patient has refill for Boniva and omeprazole send for approval since previous one was Rx by diff PCP.

## 2018-01-06 NOTE — Telephone Encounter (Signed)
Pt. Called requesting refill on Omeprazole  20 mg, Ibandronate  soduim  150 mg . Called into  [facility]. Pt call back # is  [phone]

## 2018-01-07 ENCOUNTER — Other Ambulatory Visit: Payer: Self-pay

## 2018-01-07 ENCOUNTER — Encounter: Payer: Self-pay | Admitting: Family Medicine

## 2018-01-07 MED ORDER — OMEPRAZOLE 20 MG PO CPDR: DELAYED_RELEASE_CAPSULE | ORAL | 3 refills | Status: DC

## 2018-01-07 NOTE — Telephone Encounter (Signed)
Authorized refills  Renee Boyd, [facility] 01/07/2018, 5:19 PM

## 2018-01-20 ENCOUNTER — Ambulatory Visit (INDEPENDENT_AMBULATORY_CARE_PROVIDER_SITE_OTHER): Payer: Medicare Other | Admitting: *Deleted

## 2018-01-20 DIAGNOSIS — I442 Atrioventricular block, complete: Secondary | ICD-10-CM

## 2018-01-20 NOTE — Progress Notes (Signed)
Remote pacemaker transmission.   

## 2018-01-23 LAB — CUP PACEART REMOTE DEVICE CHECK
Battery Remaining Longevity: 41 mo
Battery Voltage: 2.98 V
Brady Statistic AP VS Percent: 0.13 %
Brady Statistic AS VS Percent: 2.15 %
Brady Statistic RV Percent Paced: 94.33 %
Date Time Interrogation Session: 20190618132417
Implantable Lead Implant Date: 20141217
Implantable Lead Model: 5076
Implantable Pulse Generator Implant Date: 20141217
Lead Channel Impedance Value: 418 Ohm
Lead Channel Impedance Value: 570 Ohm
Lead Channel Pacing Threshold Amplitude: 0.375 V
Lead Channel Pacing Threshold Amplitude: 0.5 V
Lead Channel Sensing Intrinsic Amplitude: 18.875 mV
Lead Channel Sensing Intrinsic Amplitude: 4.5 mV
Lead Channel Sensing Intrinsic Amplitude: 4.5 mV
Lead Channel Setting Pacing Amplitude: 2.5 V
Lead Channel Setting Sensing Sensitivity: 0.9 mV
MDC IDC LEAD IMPLANT DT: 20141217
MDC IDC LEAD LOCATION: 753859
MDC IDC LEAD LOCATION: 753860
MDC IDC MSMT LEADCHNL RA IMPEDANCE VALUE: 418 Ohm
MDC IDC MSMT LEADCHNL RA PACING THRESHOLD PULSEWIDTH: 0.4 ms
MDC IDC MSMT LEADCHNL RV IMPEDANCE VALUE: 551 Ohm
MDC IDC MSMT LEADCHNL RV PACING THRESHOLD PULSEWIDTH: 0.4 ms
MDC IDC MSMT LEADCHNL RV SENSING INTR AMPL: 18.875 mV
MDC IDC SET LEADCHNL RA PACING AMPLITUDE: 2 V
MDC IDC SET LEADCHNL RV PACING PULSEWIDTH: 0.4 ms
MDC IDC STAT BRADY AP VP PERCENT: 43.96 %
MDC IDC STAT BRADY AS VP PERCENT: 53.77 %
MDC IDC STAT BRADY RA PERCENT PACED: 40.69 %

## 2018-01-28 ENCOUNTER — Other Ambulatory Visit: Payer: Self-pay

## 2018-01-28 ENCOUNTER — Ambulatory Visit (INDEPENDENT_AMBULATORY_CARE_PROVIDER_SITE_OTHER): Payer: Medicare Other | Admitting: Family Medicine

## 2018-01-28 ENCOUNTER — Encounter: Payer: Self-pay | Admitting: Family Medicine

## 2018-01-28 VITALS — BP 118/41 | HR 42 | Temp 98.0°F | Ht <= 58 in | Wt 177.0 lb

## 2018-01-28 DIAGNOSIS — E1122 Type 2 diabetes mellitus with diabetic chronic kidney disease: Secondary | ICD-10-CM | POA: Diagnosis not present

## 2018-01-28 DIAGNOSIS — E1165 Type 2 diabetes mellitus with hyperglycemia: Secondary | ICD-10-CM | POA: Diagnosis not present

## 2018-01-28 DIAGNOSIS — L509 Urticaria, unspecified: Secondary | ICD-10-CM | POA: Diagnosis not present

## 2018-01-28 DIAGNOSIS — IMO0002 Reserved for concepts with insufficient information to code with codable children: Secondary | ICD-10-CM

## 2018-01-28 MED ORDER — GLUCOSE BLOOD VI STRP: ORAL_STRIP | 11 refills | Status: DC

## 2018-01-28 MED ORDER — DESONIDE 0.05 % EX LOTN: 1.0000 "application " | TOPICAL_LOTION | Freq: Two times a day (BID) | CUTANEOUS | 3 refills | Status: DC | PRN

## 2018-01-28 NOTE — Patient Instructions (Addendum)
Thank you for coming to the office today.  Refilled test strips  The rash looks most consistent with a hive or dermatitis flare, this can flare up and get worse due to a variety of factors (excessive dry skin from bathing/showering, soaps, cold weather / indoor heaters, outdoor exposures).  Use the topical steroid creams twice a day for up to 1 week, maximum duration of use per one flare is 7 days, then STOP using it and allow skin to recover. Caution with over-use may cause lightening of the skin.   Limit use on face  Tips to reduce Eczema Flares: For baths/showers, limit bathing to every other day if you can (max 1 x daily)  Use a gentle, unscented soap and lukewarm water (hot water is most irritating to skin) Never scrub skin with too much pressure, this causes more irritation. Pat skin dry, then leave it slightly damp. DO NOT scrub it dry. Apply steroid cream to skin and rub in all the way, wait 15 min, then apply a daily moisturizer (Vaseline, Eucerin, Aveeno). Continue daily moisturizer every day of the year (even after flare is resolved) - If you have eczema on hands or dry hands, recommend wearing any type of gloves overnight (cloth, fabric, or even nitrile/latex) to improve effect of topical moisturizer  If develops redness, honey colored crust oozing, drainage of pus, bleeding, or redness / swelling, pain, please return for re-evaluation, may have become infected after scratching.  Please schedule a Follow-up Appointment to: Return if symptoms worsen or fail to improve, for hive / rash.  If you have any other questions or concerns, please feel free to call the office or send a message through [place]. You may also schedule an earlier appointment if necessary.  Additionally, you may be receiving a survey about your experience at our office within a few days to 1 week by e-mail or mail. We value your feedback.  [name], DO [place]

## 2018-01-28 NOTE — Progress Notes (Signed)
Subjective:    Patient ID: Renee Boyd, female    DOB: 30-Apr-1951, 67 y.o.   MRN: [id number]  Renee Boyd is a 67 y.o. female presenting on 01/28/2018 for Rash (redness and swelling on the left cheek x 3 day)  Patient presents for a same day appointment.  HPI   Urticaria Rash, Face / Pruritic Reports new spot on Left cheek for past 3 days with onset itching, some localized redness and swelling circular spot, she admits several stressors recently going to several funerals thinks this may triggered it, otherwise no known new exposures, no new topical or lotion or cream. She tried OTC hydrocortisone limited relief then switched to previous rx from 2016 Desonide topical 0.05% lotion but was nearly out and thinks it was expired. Admits itching Denies other rash, redness, pain, fever chills, throat swelling coughing wheezing nausea vomiting   Depression screen [facility] 2/9 12/19/2017 11/21/2017 09/01/2017  Decreased Interest 0 0 0  Down, Depressed, Hopeless 0 0 0  PHQ - 2 Score 0 0 0  Altered sleeping - - -  Tired, decreased energy - - -  Change in appetite - - -  Feeling bad or failure about yourself  - - -  Trouble concentrating - - -  Moving slowly or fidgety/restless - - -  Suicidal thoughts - - -  PHQ-9 Score - - -    Social History   Tobacco Use  . Smoking status: Former Smoker    Packs/day: 2.00    Years: 40.00    Pack years: 80.00    Types: Cigarettes    Last attempt to quit: 06/27/2005    Years since quitting: 12.5  . Smokeless tobacco: Former Systems developer  . Tobacco comment: quit Jun 27 2005  Substance Use Topics  . Alcohol use: No  . Drug use: No    Review of Systems Per HPI unless specifically indicated above     Objective:    BP (!) 118/41 (BP Location: Right Arm, Patient Position: Sitting, Cuff Size: Large)   Pulse (!) 42   Temp 98 F (36.7 C) (Oral)   Ht 4\' 10"  (1.473 m)   Wt 177 lb (80.3 kg)   SpO2 99%   BMI 36.99 kg/m   Wt Readings from Last 3  Encounters:  01/28/18 177 lb (80.3 kg)  12/19/17 178 lb (80.7 kg)  11/21/17 182 lb (82.6 kg)    Physical Exam  Constitutional: She is oriented to person, place, and time. She appears well-developed and well-nourished. No distress.  Well-appearing, comfortable, cooperative  HENT:  Head: Normocephalic and atraumatic.  Mouth/Throat: Oropharynx is clear and moist.  No oropharyngeal asymmetry or edema  Eyes: Conjunctivae are normal. Right eye exhibits no discharge. Left eye exhibits no discharge.  Cardiovascular: Normal rate.  Pulmonary/Chest: Effort normal and breath sounds normal. No stridor. No respiratory distress. She has no wheezes.  Musculoskeletal: She exhibits no edema.  Neurological: She is alert and oriented to person, place, and time.  Skin: Skin is warm and dry. Rash (left cheek, localized circular minimally raised 2 cm diameter minimal erythema, no dry flaking or abnormal appearance - appears like hive) noted. She is not diaphoretic. No erythema.  No other rash  Psychiatric: She has a normal mood and affect. Her behavior is normal.  Well groomed, good eye contact, normal speech and thoughts  Nursing note and vitals reviewed.      Assessment & Plan:   Problem List Items Addressed This Visit  Uncontrolled type 2 diabetes mellitus with chronic kidney disease, without long-term current use of insulin ([place]) Not focus of visit - only re order test strips Contour next as requested today    Relevant Medications   glucose blood test strip    Other Visit Diagnoses    Urticaria    -  Primary  Clinically most consistent with urticaria rash on cheek, isolated without extensive rash Considered other etiology eczema vs ringworm seems less likely given history and appearance Similar to prior hives she has experienced, usually head neck and upper body, stress and temperature triggers for her usually Not followed by Dermatology  Refill prior med Desonide 0.05% lotion 1-2 times day  for up to 1 week, since it has worked before - counseling on limit use on face avoid hypopigmentation - stop when resolved <1 week - Handout eczema type skin care counseling given - return criteria reviewed    Relevant Medications   desonide (DESOWEN) 0.05 % lotion         Meds ordered this encounter  Medications  . desonide (DESOWEN) 0.05 % lotion    Sig: Apply 1 application topically 2 (two) times daily as needed. For up to 1 week max, caution if using on face.    Dispense:  59 mL    Refill:  3  . glucose blood test strip    Sig: Check blood sugar twice daily as instructed    Dispense:  100 each    Refill:  11    Contour next - test strips      Follow up plan: Return if symptoms worsen or fail to improve, for hive / rash.  [name], [facility] 01/28/2018, 5:41 PM

## 2018-02-23 ENCOUNTER — Other Ambulatory Visit: Payer: Self-pay | Admitting: Internal Medicine

## 2018-02-23 ENCOUNTER — Other Ambulatory Visit: Payer: Self-pay | Admitting: Family Medicine

## 2018-02-23 DIAGNOSIS — F5101 Primary insomnia: Secondary | ICD-10-CM

## 2018-02-23 NOTE — Telephone Encounter (Signed)
This is a [place] pt 

## 2018-03-05 ENCOUNTER — Other Ambulatory Visit: Payer: Self-pay

## 2018-03-05 ENCOUNTER — Telehealth: Payer: Self-pay | Admitting: Family Medicine

## 2018-03-05 DIAGNOSIS — G8929 Other chronic pain: Secondary | ICD-10-CM

## 2018-03-05 DIAGNOSIS — E790 Hyperuricemia without signs of inflammatory arthritis and tophaceous disease: Secondary | ICD-10-CM

## 2018-03-05 DIAGNOSIS — I10 Essential (primary) hypertension: Secondary | ICD-10-CM

## 2018-03-05 DIAGNOSIS — M25552 Pain in left hip: Secondary | ICD-10-CM

## 2018-03-05 MED ORDER — TRAMADOL HCL 50 MG PO TABS: 50.0000 mg | ORAL_TABLET | Freq: Two times a day (BID) | ORAL | 2 refills | Status: DC

## 2018-03-05 MED ORDER — ALLOPURINOL 100 MG PO TABS: 100.0000 mg | ORAL_TABLET | Freq: Every day | ORAL | 1 refills | Status: DC

## 2018-03-05 MED ORDER — AMLODIPINE BESYLATE 10 MG PO TABS: 10.0000 mg | ORAL_TABLET | Freq: Every day | ORAL | 1 refills | Status: DC

## 2018-03-05 NOTE — Telephone Encounter (Signed)
Pt called requesting 90 day supply for Tamadol called into her NEW DRUG STORE  [facility].

## 2018-03-05 NOTE — Telephone Encounter (Signed)
Pt needs refills on allopurinol, amlodipine and tramadol sent to [facility].  Please not pharmacy change.

## 2018-03-05 NOTE — Telephone Encounter (Signed)
Refilled Tramadol to [facility]. I removed the [place] preferred pharmacy from her list.

## 2018-03-16 ENCOUNTER — Other Ambulatory Visit: Payer: Self-pay | Admitting: Internal Medicine

## 2018-03-16 ENCOUNTER — Telehealth: Payer: Self-pay | Admitting: Internal Medicine

## 2018-03-16 MED ORDER — APIXABAN 5 MG PO TABS: 5.0000 mg | ORAL_TABLET | Freq: Two times a day (BID) | ORAL | 0 refills | Status: DC

## 2018-03-16 NOTE — Telephone Encounter (Signed)
°*  STAT* If patient is at the pharmacy, call can be transferred to refill team.   1. Which medications need to be refilled? (please list name of each medication and dose if known) Eliquis 5 MG   2. Which pharmacy/location (including street and city if local pharmacy) is medication to be sent to? [facility]   3. Do they need a 30 day or 90 day supply? 90 day

## 2018-03-16 NOTE — Telephone Encounter (Signed)
Please review for refill. Thanks!  

## 2018-03-16 NOTE — Telephone Encounter (Signed)
Pt wt 80.3 kg, age 67, serum creatinine 2.02, CrCl 33.79 on labs 3/19, pt is overdue for repeat labs. Pt due for 1 year f/u w/ Dr. Caryl Comes next month- sched to see him 04/28/18. Eliquis 5 mg BID, #180 w/ 0 refills sent to [facility].

## 2018-03-23 ENCOUNTER — Other Ambulatory Visit: Payer: Self-pay

## 2018-03-23 ENCOUNTER — Other Ambulatory Visit: Payer: Medicare Other

## 2018-03-23 ENCOUNTER — Encounter: Payer: Self-pay | Admitting: Nurse Practitioner

## 2018-03-23 ENCOUNTER — Ambulatory Visit (INDEPENDENT_AMBULATORY_CARE_PROVIDER_SITE_OTHER): Payer: Medicare Other | Admitting: Nurse Practitioner

## 2018-03-23 VITALS — BP 151/49 | HR 70 | Temp 97.7°F | Resp 18 | Ht <= 58 in | Wt 173.0 lb

## 2018-03-23 DIAGNOSIS — E1122 Type 2 diabetes mellitus with diabetic chronic kidney disease: Secondary | ICD-10-CM | POA: Diagnosis not present

## 2018-03-23 DIAGNOSIS — E790 Hyperuricemia without signs of inflammatory arthritis and tophaceous disease: Secondary | ICD-10-CM | POA: Diagnosis not present

## 2018-03-23 DIAGNOSIS — B9789 Other viral agents as the cause of diseases classified elsewhere: Secondary | ICD-10-CM

## 2018-03-23 DIAGNOSIS — N183 Chronic kidney disease, stage 3 unspecified: Secondary | ICD-10-CM

## 2018-03-23 DIAGNOSIS — E1165 Type 2 diabetes mellitus with hyperglycemia: Secondary | ICD-10-CM | POA: Diagnosis not present

## 2018-03-23 DIAGNOSIS — IMO0002 Reserved for concepts with insufficient information to code with codable children: Secondary | ICD-10-CM

## 2018-03-23 DIAGNOSIS — J069 Acute upper respiratory infection, unspecified: Secondary | ICD-10-CM

## 2018-03-23 NOTE — Patient Instructions (Addendum)
Renee Boyd,   Thank you for coming in to clinic today.  1. It sounds like you have a Upper Respiratory Virus - this will most likely run it's course in 7 to 10 days. Recommend good hand washing. - Start anti-histamine loratadine (Claritin) or cetirizine (Zyrtec) 10mg  daily - If congestion is worse, start OTC Mucinex (or may try Mucinex-DM for cough) up to 7-10 days then stop - Drink plenty of fluids to improve congestion - Drink warm herbal tea with honey for sore throat. - Start taking Tylenol extra strength 1 to 2 tablets every 6-8 hours for aches or fever/chills for next few days as needed.  Do not take more than 3,000 mg in 24 hours from all medicines.    If symptoms significantly worsening with persistent fevers/chills despite tylenol/ibpurofen, nausea, vomiting unable to tolerate food/fluids or medicine, body aches, or shortness of breath, sinus pain pressure or worsening productive cough, then follow-up for re-evaluation, may seek more immediate care at Urgent Care or ED if more concerned for emergency.  Please schedule a follow-up appointment with Cassell Smiles, AGNP. Return if symptoms worsen or fail to improve, for Keep your appointment with Dr. Parks Ranger on Friday.  If you have any other questions or concerns, please feel free to call the clinic or send a message through [place]. You may also schedule an earlier appointment if necessary.  You will receive a survey after today's visit either digitally by e-mail or paper by [employer]. Your experiences and feedback matter to [place].  Please respond so we know how we are doing as we provide care for you.   Cassell Smiles, DNP, AGNP-BC Adult Gerontology Nurse Practitioner [place]

## 2018-03-23 NOTE — Progress Notes (Signed)
Subjective:    Patient ID: Renee Boyd, female    DOB: Mar 07, 1951, 67 y.o.   MRN: [id number]  Renee Boyd is a 67 y.o. female presenting on 03/23/2018 for URI (scratch throat, SOB, chest congestion and coughing  x 1 day )   HPI URI Patient presents today with scratchy throat, mildly worsened shortness of breath, chest congestion, and cough x 1 day with decreased appetite.  Yesterday took "dollar store cough syrup."  Today has shortness of breath.  No relief from cough medication. She states she fell asleep during her nap with her fans running and woke up with symptoms.  Patient states fans bother her and it was blowing over her face yesterday afternoon. - Has had chills, but no fever or sweats.  Also denies nausea, vomiting, diarrhea.   Social History   Tobacco Use  . Smoking status: Former Smoker    Packs/day: 2.00    Years: 40.00    Pack years: 80.00    Types: Cigarettes    Last attempt to quit: 06/27/2005    Years since quitting: 12.7  . Smokeless tobacco: Former Systems developer  . Tobacco comment: quit Jun 27 2005  Substance Use Topics  . Alcohol use: No  . Drug use: No    Review of Systems Per HPI unless specifically indicated above     Objective:    There were no vitals taken for this visit.  Wt Readings from Last 3 Encounters:  01/28/18 177 lb (80.3 kg)  12/19/17 178 lb (80.7 kg)  11/21/17 182 lb (82.6 kg)    Physical Exam  Constitutional: She is oriented to person, place, and time. She appears well-developed and well-nourished. No distress.  HENT:  Head: Normocephalic and atraumatic.  Right Ear: Hearing, tympanic membrane, external ear and ear canal normal.  Left Ear: Hearing, tympanic membrane, external ear and ear canal normal.  Nose: Mucosal edema and rhinorrhea present. Right sinus exhibits no maxillary sinus tenderness and no frontal sinus tenderness. Left sinus exhibits no maxillary sinus tenderness and no frontal sinus tenderness.  Mouth/Throat: Uvula is  midline and mucous membranes are normal. Posterior oropharyngeal edema (cobblestoning) present. Tonsils are 0 on the right. Tonsils are 0 on the left.  Eyes: Pupils are equal, round, and reactive to light. Conjunctivae, EOM and lids are normal.  Cardiovascular: Normal rate, regular rhythm, S1 normal, S2 normal, normal heart sounds and intact distal pulses.  Pulmonary/Chest: Effort normal and breath sounds normal. No respiratory distress.  Neurological: She is alert and oriented to person, place, and time.  Skin: Skin is warm and dry. Capillary refill takes less than 2 seconds.  Psychiatric: She has a normal mood and affect. Her behavior is normal. Judgment and thought content normal.  Vitals reviewed.        Assessment & Plan:   Problem List Items Addressed This Visit    None    Visit Diagnoses    Viral URI with cough    -  Primary    Acute illness. Fever responsive to NSAIDs and tylenol.  Symptoms not worsening. Consistent with viral illness x 1 days with no known sick contacts, but possible exposure at [facility] yesterday and no identifiable focal infections of ears, nose, throat.  Plan: 1. Reassurance, likely self-limited with cough lasting up to few weeks - Start anti-histamine Loratadine or cetirizine 10mg  daily,  - also can use Flonase 2 sprays each nostril daily for up to 4-6 weeks - Start Mucinex-DM OTC up to 7-10 days  then stop 2. Supportive care with nasal saline, warm herbal tea with honey, 3. Improve hydration 4. Tylenol / Motrin PRN fevers 5. Return criteria given     Follow up plan: Return if symptoms worsen or fail to improve, for Keep your appointment with Dr. Parks Ranger on Friday.  Cassell Smiles, DNP, AGPCNP-BC Adult Gerontology Primary Care Nurse Practitioner [facility] 03/23/2018, 10:27 AM

## 2018-03-24 ENCOUNTER — Telehealth: Payer: Self-pay | Admitting: Nurse Practitioner

## 2018-03-24 ENCOUNTER — Other Ambulatory Visit: Payer: Self-pay | Admitting: Family Medicine

## 2018-03-24 DIAGNOSIS — R252 Cramp and spasm: Secondary | ICD-10-CM

## 2018-03-24 DIAGNOSIS — E1165 Type 2 diabetes mellitus with hyperglycemia: Principal | ICD-10-CM

## 2018-03-24 DIAGNOSIS — E1122 Type 2 diabetes mellitus with diabetic chronic kidney disease: Secondary | ICD-10-CM

## 2018-03-24 DIAGNOSIS — IMO0002 Reserved for concepts with insufficient information to code with codable children: Secondary | ICD-10-CM

## 2018-03-24 LAB — BASIC METABOLIC PANEL WITH GFR
BUN / CREAT RATIO: 17 (calc) (ref 6–22)
BUN: 21 mg/dL (ref 7–25)
CO2: 23 mmol/L (ref 20–32)
Calcium: 9.1 mg/dL (ref 8.6–10.4)
Chloride: 106 mmol/L (ref 98–110)
Creat: 1.24 mg/dL — ABNORMAL HIGH (ref 0.50–0.99)
GFR, Est African American: 52 mL/min/{1.73_m2} — ABNORMAL LOW (ref >=60)
GFR, Est Non African American: 45 mL/min/{1.73_m2} — ABNORMAL LOW (ref >=60)
GLUCOSE: 133 mg/dL — AB (ref 65–99)
POTASSIUM: 4.9 mmol/L (ref 3.5–5.3)
SODIUM: 142 mmol/L (ref 135–146)

## 2018-03-24 LAB — HEMOGLOBIN A1C
HEMOGLOBIN A1C: 7.8 %{Hb} — AB (ref <5.7)
MEAN PLASMA GLUCOSE: 177 (calc)
eAG (mmol/L): 9.8 (calc)

## 2018-03-24 LAB — URIC ACID: Uric Acid, Serum: 5.8 mg/dL (ref 2.5–7.0)

## 2018-03-24 MED ORDER — BACLOFEN 5 MG PO TABS: 2.5000 mg | ORAL_TABLET | Freq: Two times a day (BID) | ORAL | 2 refills | Status: DC | PRN

## 2018-03-24 NOTE — Telephone Encounter (Signed)
Pt needs refills on baclofen and janument sent to [facility].

## 2018-03-24 NOTE — Telephone Encounter (Signed)
Already sent Janumet earlier today. New refill for baclofen sent as well.  [name], [facility] 03/24/2018, 5:21 PM

## 2018-03-27 ENCOUNTER — Encounter: Payer: Self-pay | Admitting: Family Medicine

## 2018-03-27 ENCOUNTER — Ambulatory Visit (INDEPENDENT_AMBULATORY_CARE_PROVIDER_SITE_OTHER): Payer: Medicare Other | Admitting: Family Medicine

## 2018-03-27 VITALS — BP 137/54 | HR 61 | Temp 97.9°F | Resp 16 | Ht <= 58 in | Wt 171.0 lb

## 2018-03-27 DIAGNOSIS — N183 Chronic kidney disease, stage 3 unspecified: Secondary | ICD-10-CM

## 2018-03-27 DIAGNOSIS — I10 Essential (primary) hypertension: Secondary | ICD-10-CM | POA: Diagnosis not present

## 2018-03-27 DIAGNOSIS — E1121 Type 2 diabetes mellitus with diabetic nephropathy: Secondary | ICD-10-CM | POA: Diagnosis not present

## 2018-03-27 DIAGNOSIS — M810 Age-related osteoporosis without current pathological fracture: Secondary | ICD-10-CM

## 2018-03-27 DIAGNOSIS — R252 Cramp and spasm: Secondary | ICD-10-CM | POA: Diagnosis not present

## 2018-03-27 MED ORDER — IBANDRONATE SODIUM 150 MG PO TABS: 150.0000 mg | ORAL_TABLET | ORAL | 3 refills | Status: DC

## 2018-03-27 MED ORDER — LISINOPRIL 20 MG PO TABS: 20.0000 mg | ORAL_TABLET | Freq: Every day | ORAL | 3 refills | Status: DC

## 2018-03-27 NOTE — Assessment & Plan Note (Signed)
Stable improved on baclofen Refill Baclofen 5mg  nightly for 90 day supply to [facility]

## 2018-03-27 NOTE — Patient Instructions (Addendum)
Thank you for coming to the office today.  If you change mind - please schedule and return for a NURSE ONLY VISIT for VACCINE - Approximately around October 2019 - Would recommend High Dose Flu Vaccine  Today we sent rx to [facility] for Ibandronate and Lisinopril  Let me know when ready for any rx to be sent to [facility] - call us 2 week in advance to request this - When ready we will send Baclofen 5mg  nightly for 90 day supply  Kidney function is improved, Cr 1.24 - good work!  A1c 7.8 - great job - continue Janumet for now  Gout level 5.8 this is good result. Continue Allopurinol 100mg  daily no change in medicine  Our goal is to prevent future gout flares. Try to avoid dietary triggers that are the most common causes of gout flares. - Avoid the following foods/drinks: - Red meat, organ meat (liver) - Alcohol (especially beer, also wine, liquor) - Processed foods / carbs (white bread, white rice, pasta, sugar) - Sugary drinks (sweet tea, soda) - Shellfish, shrimp / lobster  - Foods that are preferred to eat: - Beans, Lentils, Whole grains, Quinoa - Fruits, Vegetables - Dairy, Cheese, Yogurt - Soy based protein  Please schedule a Follow-up Appointment to: Return in about 3 months (around 06/27/2018) for DM A1c (f/u other specialist, may need TSH).  If you have any other questions or concerns, please feel free to call the office or send a message through [place]. You may also schedule an earlier appointment if necessary.  Additionally, you may be receiving a survey about your experience at our office within a few days to 1 week by e-mail or mail. We value your feedback.  [name], DO [place]

## 2018-03-27 NOTE — Progress Notes (Signed)
Subjective:    Patient ID: Renee Boyd, female    DOB: 1950-11-09, 67 y.o.   MRN: [id number]  Renee Boyd is a 67 y.o. female presenting on 03/27/2018 for Diabetes   HPI   Specialists: Cardiology - Dr Virl Axe (Cardiology, EP), pacemaker checks Nephrology - Dr Lavonia Dana ([place]) Podiatry - Dr Daylene Katayama [facility])  CHRONIC DM, Type 2 with CKD-III Last visit 12/2017, discontinued Glipizide and restarted Janumet Now recent A1c 10.5 down to 7.8 CBGs: does not have cbg log today Meds: Janumet 50-500mg  BID - asking about 30 day vs 90 day - only seems to cover 30 day supply - Off sulfonylurea Reports good compliance. Tolerating well w/o side-effects Currently on ACEi - per renal - Last lab showed chemistry Cr 1.24, with improved Cr, has followed by Nephrology [facility] Lifestyle: - Weight down 6-7 lbs in 2 months - Diet (diet improved, avoiding cooking with oils and doing more home cooking) - Exercise (walking regularly for exercise) [facility] - updated DM Eye exam in 01/2018 Denies hypoglycemia, polyuria, visual changes, numbness or tingling.  CHRONIC HTN: Reports no new concerns. Readings improved Current Meds - Metoprolol XL 100mg  daily, Lisinopril 20mg  daily, Furosemide 20mg  x 2 daily, Amlodipine 10mg  daily   Reports good compliance, took meds today. Tolerating well, w/o complaints. Denies CP, dyspnea, HA, edema, dizziness / lightheadedness  Gout / Hyperuricemia / Bunions bilateral Followed by [facility]iat[facility] Dr Amalia Hailey, has received foot injection in past for gout. Recently had mild flare, asking about diet options to reduce gout, she eats more ground [place] and not beef, avoid shellfish. - She admits episodes of pain with bunions on feet L>R often, now not flared up - Recent result Uric Acid 5.8, on Allopurinol 100mg  daily  Chronic Muscle Spasms, nightly Today reports doing well on Baclofen 5mg  nightly - request 90 day refill for future No longer on  Flexeril Taking Gabapentin  Osteoporosis Improved on Ibandronate 150mg  monthly, due for refill  Additional complaints - Regarding her pharmacy, was at [facility], then due to cost, had to switch to [place] 03/05/18, now she plans to use [facility] Mail order for 90 day and [facility] locally for 30 day meds (Temazepam, Tramadol)  Depression screen [facility] 2/9 03/27/2018 12/19/2017 11/21/2017  Decreased Interest 0 0 0  Down, Depressed, Hopeless 0 0 0  PHQ - 2 Score 0 0 0  Altered sleeping - - -  Tired, decreased energy - - -  Change in appetite - - -  Feeling bad or failure about yourself  - - -  Trouble concentrating - - -  Moving slowly or fidgety/restless - - -  Suicidal thoughts - - -  PHQ-9 Score - - -    Social History   Tobacco Use  . Smoking status: Former Smoker    Packs/day: 2.00    Years: 40.00    Pack years: 80.00    Types: Cigarettes    Last attempt to quit: 06/27/2005    Years since quitting: 12.7  . Smokeless tobacco: Former Systems developer  . Tobacco comment: quit Jun 27 2005  Substance Use Topics  . Alcohol use: No  . Drug use: No    Review of Systems Per HPI unless specifically indicated above     Objective:    BP (!) 137/54   Pulse 61   Temp 97.9 F (36.6 C) (Oral)   Resp 16   Ht 4\' 10"  (1.473 m)   Wt 171 lb (77.6  kg)   BMI 35.74 kg/m   Wt Readings from Last 3 Encounters:  03/27/18 171 lb (77.6 kg)  03/23/18 173 lb (78.5 kg)  01/28/18 177 lb (80.3 kg)    Physical Exam  Constitutional: She is oriented to person, place, and time. She appears well-developed and well-nourished. No distress.  Well-appearing, comfortable, cooperative, obese  HENT:  Head: Normocephalic and atraumatic.  Mouth/Throat: Oropharynx is clear and moist.  Eyes: Conjunctivae are normal. Right eye exhibits no discharge. Left eye exhibits no discharge.  Cardiovascular: Normal rate.  Pulmonary/Chest: Effort normal.  Musculoskeletal: She exhibits no edema.  Bilateral great toe  bunions present, similar appearance, without erythema edema.  Neurological: She is alert and oriented to person, place, and time.  Skin: Skin is warm and dry. No rash noted. She is not diaphoretic. No erythema.  Psychiatric: She has a normal mood and affect. Her behavior is normal.  Well groomed, good eye contact, normal speech and thoughts  Nursing note and vitals reviewed.  Results for orders placed or performed in visit on 03/23/18  Uric acid  Result Value Ref Range   Uric Acid, Serum 5.8 2.5 - 7.0 mg/dL  BASIC METABOLIC PANEL WITH GFR  Result Value Ref Range   Glucose, Bld 133 (H) 65 - 99 mg/dL   BUN 21 7 - 25 mg/dL   Creat 1.24 (H) 0.50 - 0.99 mg/dL   GFR, Est Non African American 45 (L) > OR = 60 mL/min/1.23m2   GFR, Est African American 52 (L) > OR = 60 mL/min/1.48m2   BUN/Creatinine Ratio 17 6 - 22 (calc)   Sodium 142 135 - 146 mmol/L   Potassium 4.9 3.5 - 5.3 mmol/L   Chloride 106 98 - 110 mmol/L   CO2 23 20 - 32 mmol/L   Calcium 9.1 8.6 - 10.4 mg/dL  Hemoglobin A1c  Result Value Ref Range   Hgb A1c MFr Bld 7.8 (H) <5.7 % of total Hgb   Mean Plasma Glucose 177 (calc)   eAG (mmol/L) 9.8 (calc)      Assessment & Plan:   Problem List Items Addressed This Visit    Essential hypertension, benign - Primary    Improved HTN control Complication with CKD-III, CHB pacer, AFib Followed by Cardiology    Plan:  1. Continue current BP regimen - Metoprolol-XL 100mg  daily, Amlodipine 10mg , Lisinopril 20mg  daily - Furosemide 40mg  daily (x2 of 20mg  tabs) per Cardiology 2. Encourage improved lifestyle - low sodium diet, regular exercise 3. Continue monitor BP outside office, bring readings to next visit, if persistently >140/90 or new symptoms notify office sooner      Relevant Medications   lisinopril (PRINIVIL,ZESTRIL) 20 MG tablet   Muscle cramps at night    Stable improved on baclofen Refill Baclofen 5mg  nightly for 90 day supply to [facility]      Relevant  Medications   Baclofen 5 MG TABS   Osteoporosis without current pathological fracture    Stable, controlled on ibandronate Last DEXA 01/2017 Refill ibandronate 150mg  monthly #3 for 90 day supply with refills - sent to [facility] Follow-up repeat DEXA in 2020 or 2021      Relevant Medications   ibandronate (BONIVA) 150 MG tablet   Stage 3 chronic kidney disease (HCC)    Improved Cr on re-check, now stable CKD-III back to baseline Followed by [facility] Dr Juleen China Secondary CKD due to HTN, DM Age  Plan Follow-up w/ Nephrology Continue current meds      Type  2 diabetes with nephropathy ([place])    Improved DM control now with less hyperglycemia, A1c now 7.8 down from 10.5 previously, back on Janumet No hypoglycemia Complications - CKD-III, other including, GERD, obesity, hypothyroidism, Gout - increases risk of future cardiovascular complications   Plan:  1. Continue current therapy - Janumet 50-500mg  BID 2. Encourage improved lifestyle - low carb, low sugar diet, reduce portion size, continue improving regular exercise 3. Check CBG, bring log to next visit for review 4. Continue ACEi, Statin 5. Follow-up 3 months DM A1c - then space out to q 6 months in future, will defer GLP1 or med change now       Relevant Medications   lisinopril (PRINIVIL,ZESTRIL) 20 MG tablet      Meds ordered this encounter  Medications  . ibandronate (BONIVA) 150 MG tablet    Sig: Take 1 tablet (150 mg total) by mouth every 30 (thirty) days. Take in the morning with a full glass of water, on an empty stomach    Dispense:  3 tablet    Refill:  3  . lisinopril (PRINIVIL,ZESTRIL) 20 MG tablet    Sig: Take 1 tablet (20 mg total) by mouth daily.    Dispense:  90 tablet    Refill:  3    Follow up plan: Return in about 3 months (around 06/27/2018) for DM A1c (f/u other specialist, may need TSH).  [name], [facility] 03/27/2018,  1:35 PM

## 2018-03-27 NOTE — Assessment & Plan Note (Addendum)
Improved HTN control Complication with CKD-III, CHB pacer, AFib Followed by Cardiology    Plan:  1. Continue current BP regimen - Metoprolol-XL 100mg  daily, Amlodipine 10mg , Lisinopril 20mg  daily - Furosemide 40mg  daily (x2 of 20mg  tabs) per Cardiology 2. Encourage improved lifestyle - low sodium diet, regular exercise 3. Continue monitor BP outside office, bring readings to next visit, if persistently >140/90 or new symptoms notify office sooner

## 2018-03-27 NOTE — Assessment & Plan Note (Signed)
Improved Cr on re-check, now stable CKD-III back to baseline Followed by [facility] Dr Juleen China Secondary CKD due to HTN, DM Age  Plan Follow-up w/ Nephrology Continue current meds

## 2018-03-27 NOTE — Assessment & Plan Note (Signed)
Stable, controlled on ibandronate Last DEXA 01/2017 Refill ibandronate 150mg  monthly #3 for 90 day supply with refills - sent to [facility] Follow-up repeat DEXA in 2020 or 2021

## 2018-03-27 NOTE — Assessment & Plan Note (Signed)
Improved DM control now with less hyperglycemia, A1c now 7.8 down from 10.5 previously, back on Janumet No hypoglycemia Complications - CKD-III, other including, GERD, obesity, hypothyroidism, Gout - increases risk of future cardiovascular complications   Plan:  1. Continue current therapy - Janumet 50-500mg  BID 2. Encourage improved lifestyle - low carb, low sugar diet, reduce portion size, continue improving regular exercise 3. Check CBG, bring log to next visit for review 4. Continue ACEi, Statin 5. Follow-up 3 months DM A1c - then space out to q 6 months in future, will defer GLP1 or med change now

## 2018-03-30 ENCOUNTER — Other Ambulatory Visit: Payer: Self-pay | Admitting: Family Medicine

## 2018-03-30 MED ORDER — OMEPRAZOLE 20 MG PO CPDR: DELAYED_RELEASE_CAPSULE | ORAL | 3 refills | Status: DC

## 2018-03-30 NOTE — Telephone Encounter (Signed)
Pt needs a refill on omeprazole sent to [facility].  Her call back number is [phone]

## 2018-04-07 ENCOUNTER — Ambulatory Visit (INDEPENDENT_AMBULATORY_CARE_PROVIDER_SITE_OTHER): Payer: Medicare Other | Admitting: Podiatry

## 2018-04-07 ENCOUNTER — Encounter: Payer: Self-pay | Admitting: Podiatry

## 2018-04-07 DIAGNOSIS — M659 Synovitis and tenosynovitis, unspecified: Secondary | ICD-10-CM

## 2018-04-10 NOTE — Progress Notes (Signed)
   Subjective:  67 year old female presenting today for follow up evaluation of left ankle and 1st MPJ pain of the left foot. She reports the pain has improved but has not resolved completely. She reports some associated darkening of the skin of the 1st MPJ. She reports the injection helped alleviate the pain. There are no modifying factors noted. Patient is here for further evaluation and treatment.   Past Medical History:  Diagnosis Date  . GERD (gastroesophageal reflux disease)   . Hypertension   . Pacemaker   . Stroke [facility])      Objective / Physical Exam:  General:  The patient is alert and oriented x3 in no acute distress. Dermatology:  Skin is warm, dry and supple bilateral lower extremities. Negative for open lesions or macerations. Vascular:  Palpable pedal pulses bilaterally. No edema or erythema noted. Capillary refill within normal limits. Neurological:  Epicritic and protective threshold grossly intact bilaterally.  Musculoskeletal Exam:  Pain on palpation to the anterior lateral medial aspects of the patient's left ankle. Mild edema noted. Range of motion within normal limits to all pedal and ankle joints bilateral. Muscle strength 5/5 in all groups bilateral.   Radiographic Exam:  Normal osseous mineralization. Joint spaces preserved. No fracture/dislocation/boney destruction.    Assessment: 1. Left ankle synovitis   Plan of Care:  1. Patient was evaluated. 2. injection of 0.5 mL Celestone Soluspan injected in the patient's left ankle. 3. Continue wearing good shoes.  4. Return to clinic as needed.    Edrick Kins, DPM [facility]  Dr. Edrick Kins, [place]                                        [place], [place] [address]                Office [phone]  Fax [phone]

## 2018-04-13 ENCOUNTER — Telehealth: Payer: Self-pay | Admitting: Family Medicine

## 2018-04-13 DIAGNOSIS — E1165 Type 2 diabetes mellitus with hyperglycemia: Principal | ICD-10-CM

## 2018-04-13 DIAGNOSIS — E1122 Type 2 diabetes mellitus with diabetic chronic kidney disease: Secondary | ICD-10-CM

## 2018-04-13 DIAGNOSIS — IMO0002 Reserved for concepts with insufficient information to code with codable children: Secondary | ICD-10-CM

## 2018-04-13 MED ORDER — GLUCOSE BLOOD VI STRP: ORAL_STRIP | 11 refills | Status: DC

## 2018-04-13 NOTE — Telephone Encounter (Signed)
Rx send for test strip but has already refill for baclofen.

## 2018-04-13 NOTE — Telephone Encounter (Signed)
Pt needs refills on baclofen and contour next test strips sent to [facility]

## 2018-04-20 ENCOUNTER — Other Ambulatory Visit: Payer: Self-pay

## 2018-04-20 MED ORDER — APIXABAN 5 MG PO TABS: 5.0000 mg | ORAL_TABLET | Freq: Two times a day (BID) | ORAL | 0 refills | Status: DC

## 2018-04-20 MED ORDER — ATORVASTATIN CALCIUM 20 MG PO TABS: 20.0000 mg | ORAL_TABLET | Freq: Every day | ORAL | 0 refills | Status: DC

## 2018-04-20 NOTE — Telephone Encounter (Signed)
*  STAT* If patient is at the pharmacy, call can be transferred to refill team.   1. Which medications need to be refilled? (please list name of each medication and dose if known) Eliquis,  Atorvastatin  2. Which pharmacy/location (including street and city if local pharmacy) is medication to be sent to?[facility]  3. Do they need a 30 day or 90 day supply? [place]

## 2018-04-20 NOTE — Telephone Encounter (Signed)
Please review Eliquis for refill Atorvastatin has been approved.

## 2018-04-21 ENCOUNTER — Ambulatory Visit (INDEPENDENT_AMBULATORY_CARE_PROVIDER_SITE_OTHER): Payer: Medicare Other | Admitting: *Deleted

## 2018-04-21 ENCOUNTER — Telehealth: Payer: Self-pay

## 2018-04-21 DIAGNOSIS — I442 Atrioventricular block, complete: Secondary | ICD-10-CM

## 2018-04-21 NOTE — Telephone Encounter (Signed)
Error

## 2018-04-22 ENCOUNTER — Other Ambulatory Visit: Payer: Self-pay | Admitting: Family Medicine

## 2018-04-22 DIAGNOSIS — F5101 Primary insomnia: Secondary | ICD-10-CM

## 2018-04-22 NOTE — Progress Notes (Signed)
Remote pacemaker transmission.   

## 2018-04-28 ENCOUNTER — Encounter: Payer: Self-pay | Admitting: Internal Medicine

## 2018-04-28 ENCOUNTER — Ambulatory Visit (INDEPENDENT_AMBULATORY_CARE_PROVIDER_SITE_OTHER): Payer: Medicare Other | Admitting: Internal Medicine

## 2018-04-28 VITALS — BP 158/80 | HR 67 | Ht 59.0 in | Wt 168.8 lb

## 2018-04-28 DIAGNOSIS — I442 Atrioventricular block, complete: Secondary | ICD-10-CM | POA: Diagnosis not present

## 2018-04-28 DIAGNOSIS — I10 Essential (primary) hypertension: Secondary | ICD-10-CM | POA: Diagnosis not present

## 2018-04-28 DIAGNOSIS — Z950 Presence of cardiac pacemaker: Secondary | ICD-10-CM | POA: Diagnosis not present

## 2018-04-28 DIAGNOSIS — I4819 Other persistent atrial fibrillation: Secondary | ICD-10-CM

## 2018-04-28 DIAGNOSIS — I481 Persistent atrial fibrillation: Secondary | ICD-10-CM

## 2018-04-28 MED ORDER — METOPROLOL SUCCINATE ER 100 MG PO TB24: 100.0000 mg | ORAL_TABLET | Freq: Every day | ORAL | 3 refills | Status: DC

## 2018-04-28 NOTE — Progress Notes (Signed)
Patient Care Team: Olin Hauser, DO as PCP - General (Family Medicine) Lavonia Dana, MD as Consulting Physician (Nephrology) Deboraha Sprang, MD as Consulting Physician (Cardiology) Edrick Kins, DPM as Consulting Physician (Podiatry)   HPI  Renee Boyd is a 67 y.o. female Seen in followup for largely asymptomatic permanent atrial fibrillation.  She transferred care to here 7/17.  At that time, anticoagulation was initiated.    She is s/p pacemaker insertion Medtronic 2014  She has hx of HTN  DM and Grade 3 Renal insufficiency    Records and Results Reviewed Date Cr K Hgb TSH  10/17 1.36  8.8 0.304  2/18 1.73 4.9  0.6  6/18  1.3  11.1                DATE TEST EF   7/17 Echo   60-65 % Pulm HTN 64 (no E/E')              She struggled this winter with congestive heart failure manifested by volume overload and peripheral edema.  She ended up having to change doctors.  Dr. Raliegh Ip was able to get her diuresed.  Unfortunately, it was complicated by gout.  More recently she has been doing moderately well.  She has dyspnea on exertion but is able to carry groceries in from the car.  She denies nocturnal dyspnea or snoring but she has daytime somnolence and significant fatigue.  She did not undertake a sleep study because of concerns regarding hair oils.     As she was nearing the end of her Eliquis prescription, prior to its been refilled, she took it daily.  She comes in today with a form having been sent from [place] ascribing a nontraumatic intracerebral hemorrhage to her.  She has no idea what is talking about.  Review of the medical record demonstrates no imaging.  She wonders whether it is an error.     Thromboembolic risk factors ( age 67, HTN-1, TIA/CVA-2, DM-1 , Gender-1) for a CHADSVASc Score of 6     Past Medical History:  Diagnosis Date  . GERD (gastroesophageal reflux disease)   . Hypertension   . Pacemaker   . Stroke [facility])      Past Surgical History:  Procedure Laterality Date  . ABDOMINAL HYSTERECTOMY    . CHOLECYSTECTOMY    . TONSILLECTOMY    . TUBAL LIGATION      Current Outpatient Medications  Medication Sig Dispense Refill  . allopurinol (ZYLOPRIM) 100 MG tablet Take 1 tablet (100 mg total) by mouth daily. 90 tablet 1  . amLODipine (NORVASC) 10 MG tablet Take 1 tablet (10 mg total) by mouth daily. 90 tablet 1  . apixaban (ELIQUIS) 5 MG TABS tablet Take 1 tablet (5 mg total) by mouth 2 (two) times daily. 60 tablet 0  . atorvastatin (LIPITOR) 20 MG tablet Take 1 tablet (20 mg total) by mouth daily. 90 tablet 0  . Baclofen 5 MG TABS Take 5 mg by mouth at bedtime. 90 tablet 3  . desonide (DESOWEN) 0.05 % lotion Apply 1 application topically 2 (two) times daily as needed. For up to 1 week max, caution if using on face. 59 mL 3  . furosemide (LASIX) 20 MG tablet TAKE 2 TABLETS BY MOUTH EVERY MORNING 180 tablet 2  . gabapentin (NEURONTIN) 300 MG capsule Take 1 capsule (300 mg total) by mouth 2 (two) times daily. 180 capsule 3  . glucose blood  test strip Check blood sugar twice daily as instructed 100 each 11  . ibandronate (BONIVA) 150 MG tablet Take 1 tablet (150 mg total) by mouth every 30 (thirty) days. Take in the morning with a full glass of water, on an empty stomach 3 tablet 3  . JANUMET 50-500 MG tablet TAKE 1 TABLET BY MOUTH TWICE DAILY WITH A MEAL 180 tablet 1  . levothyroxine (SYNTHROID, LEVOTHROID) 25 MCG tablet Take 1 tablet (25 mcg total) by mouth daily before breakfast. 90 tablet 1  . lisinopril (PRINIVIL,ZESTRIL) 20 MG tablet Take 1 tablet (20 mg total) by mouth daily. 90 tablet 3  . metoprolol succinate (TOPROL-XL) 100 MG 24 hr tablet Take 1 tablet (100 mg total) by mouth daily. Take with or immediately following a meal. 90 tablet 3  . omeprazole (PRILOSEC) 20 MG capsule TAKE 1 CAPSULE BY MOUTH TWICE A DAY BEFORE A MEAL 180 capsule 3  . temazepam (RESTORIL) 30 MG capsule TAKE 1 CAPSULE BY MOUTH  AT BEDTIME AS NEEDED SLEEP 30 capsule 2  . traMADol (ULTRAM) 50 MG tablet Take 1 tablet (50 mg total) by mouth 2 (two) times daily. 60 tablet 2   No current facility-administered medications for this visit.     Allergies  Allergen Reactions  . Morphine And Related Itching  . Percocet [Oxycodone-Acetaminophen] Other (See Comments)    Hallucinations       Review of Systems negative except from HPI and PMH  Physical Exam BP (!) 158/80 (BP Location: Left Arm, Patient Position: Sitting, Cuff Size: Normal)   Pulse 67   Ht 4\' 11"  (1.499 m)   Wt 168 lb 12 oz (76.5 kg)   BMI 34.08 kg/m  Well developed and nourished in no acute distress HENT normal Neck supple with JVP-8 Clear Regular rate and rhythm, no murmurs or gallops Abd-soft with active BS No Clubbing cyanosis 1+ edema Skin-warm and dry A & Oriented  Grossly normal sensory and motor function   ECG atrial flutter with an atrial cycle length of 220 ms Intervals-/15/44 Ventricular paced rhythm  Assessment and  Plan  Atrial fibrillation long-term persistent  Hypertension   Pacemaker  Medtronic    Hyperlipidemia  DM-nephropathy  Complete heart block  Sleep disordered breathing and daytime somnolence  She has persistent atrial arrhythmias.  Occurring 1-2 months at a time.  It is not clear as to whether her symptoms are less when she is in sinus rhythm.  We will anticipate cardioversion in about 3 weeks time as she missed some of her interval Eliquis.  We will then try to ascertain as to whether sinus rhythm is associated with improved functional status prior to making a decision regarding antiarrhythmic therapy.  She will need to transmit in 3 weeks to see whether she reverted spontaneously and if not we will pursue DCCV  She is volume overloaded.  We will increase her diuretics from 40--60 daily.  We have discussed the relation of excessive diuresis and gout.  I encouraged her to decrease her salt and water intake so  as to be able to avoid the use of diuretics  She is agreeable to undertaking a sleep study   More than 50% of 40 min was spent in counseling related to the above

## 2018-04-28 NOTE — Patient Instructions (Addendum)
Medication Instructions: - Your physician has recommended you make the following change in your medication:   1) INCREASE lasix (furosemide) 20 mg- take 3 tablets (60 mg) by mouth every morning x 3 days, then resume 2 tablets (40 mg) once daily  2) Make sure that you are taking your Eliquis 5 mg TWICE daily  Samples given: Eliquis 5 mg Lot: [id number] Exp: 6/21 # 3 boxes   Labwork: - none ordered  Procedures/Testing: - none ordered  Follow-Up: - Remote monitoring is used to monitor your Pacemaker of ICD from home. This monitoring reduces the number of office visits required to check your device to one time per year. It allows [place] to keep an eye on the functioning of your device to ensure it is working properly. You are scheduled for a device check from home on 05/19/18. You may send your transmission at any time that day. If you have a wireless device, the transmission will be sent automatically- once the transmission is received and reviewed, we will determine whether or not you are still out of rhythm, if so we will set you up for a Cardioversion to be done.   - Your physician recommends that you schedule a follow-up appointment: Pending your next transmission.  Any Additional Special Instructions Will Be Listed Below (If Applicable).     If you need a refill on your cardiac medications before your next appointment, please call your pharmacy.    Electrical Cardioversion Electrical cardioversion is the delivery of a jolt of electricity to restore a normal rhythm to the heart. A rhythm that is too fast or is not regular keeps the heart from pumping well. In this procedure, sticky patches or metal paddles are placed on the chest to deliver electricity to the heart from a device. This procedure may be done in an emergency if:  There is low or no blood pressure as a result of the heart rhythm.  Normal rhythm must be restored as fast as possible to protect the brain and heart from  further damage.  It may save a life.  This procedure may also be done for irregular or fast heart rhythms that are not immediately life-threatening. Tell a health care provider about:  Any allergies you have.  All medicines you are taking, including vitamins, herbs, eye drops, creams, and over-the-counter medicines.  Any problems you or family members have had with anesthetic medicines.  Any blood disorders you have.  Any surgeries you have had.  Any medical conditions you have.  Whether you are pregnant or may be pregnant. What are the risks? Generally, this is a safe procedure. However, problems may occur, including:  Allergic reactions to medicines.  A blood clot that breaks free and travels to other parts of your body.  The possible return of an abnormal heart rhythm within hours or days after the procedure.  Your heart stopping (cardiac arrest). This is rare.  What happens before the procedure? Medicines  Your health care provider may have you start taking: ? Blood-thinning medicines (anticoagulants) so your blood does not clot as easily. ? Medicines may be given to help stabilize your heart rate and rhythm.  Ask your health care provider about changing or stopping your regular medicines. This is especially important if you are taking diabetes medicines or blood thinners. General instructions  Plan to have someone take you home from the hospital or clinic.  If you will be going home right after the procedure, plan to have someone with you  for 24 hours.  Follow instructions from your health care provider about eating or drinking restrictions. What happens during the procedure?  To lower your risk of infection: ? Your health care team will wash or sanitize their hands. ? Your skin will be washed with soap.  An IV tube will be inserted into one of your veins.  You will be given a medicine to help you relax (sedative).  Sticky patches (electrodes) or metal  paddles may be placed on your chest.  An electrical shock will be delivered. The procedure may vary among health care providers and hospitals. What happens after the procedure?  Your blood pressure, heart rate, breathing rate, and blood oxygen level will be monitored until the medicines you were given have worn off.  Do not drive for 24 hours if you were given a sedative.  Your heart rhythm will be watched to make sure it does not change. This information is not intended to replace advice given to you by your health care provider. Make sure you discuss any questions you have with your health care provider. Document Released: 07/12/2002 Document Revised: 03/20/2016 Document Reviewed: 01/26/2016 Elsevier Interactive Patient Education  2017 [employer].

## 2018-05-05 ENCOUNTER — Other Ambulatory Visit: Payer: Self-pay | Admitting: Family Medicine

## 2018-05-05 DIAGNOSIS — M25552 Pain in left hip: Principal | ICD-10-CM

## 2018-05-05 DIAGNOSIS — G8929 Other chronic pain: Secondary | ICD-10-CM

## 2018-05-07 ENCOUNTER — Other Ambulatory Visit: Payer: Self-pay | Admitting: Family Medicine

## 2018-05-07 DIAGNOSIS — G8929 Other chronic pain: Secondary | ICD-10-CM

## 2018-05-07 DIAGNOSIS — M25552 Pain in left hip: Principal | ICD-10-CM

## 2018-05-07 MED ORDER — TRAMADOL HCL 50 MG PO TABS: 50.0000 mg | ORAL_TABLET | Freq: Two times a day (BID) | ORAL | 2 refills | Status: DC

## 2018-05-07 NOTE — Telephone Encounter (Signed)
Agreed to refill Tramadol for now we will discuss at next follow-up  [name], [facility] 05/07/2018, 2:57 PM

## 2018-05-13 LAB — CUP PACEART REMOTE DEVICE CHECK
Battery Remaining Longevity: 39 mo
Brady Statistic AP VS Percent: 0.11 %
Brady Statistic AS VS Percent: 9.07 %
Brady Statistic RV Percent Paced: 81.56 %
Implantable Lead Implant Date: 20141217
Implantable Lead Location: 753859
Implantable Lead Location: 753860
Implantable Lead Model: 5076
Implantable Lead Model: 5076
Implantable Pulse Generator Implant Date: 20141217
Lead Channel Impedance Value: 380 Ohm
Lead Channel Impedance Value: 418 Ohm
Lead Channel Impedance Value: 551 Ohm
Lead Channel Pacing Threshold Amplitude: 0.375 V
Lead Channel Pacing Threshold Amplitude: 0.5 V
Lead Channel Pacing Threshold Pulse Width: 0.4 ms
Lead Channel Sensing Intrinsic Amplitude: 2.875 mV
Lead Channel Sensing Intrinsic Amplitude: 2.875 mV
Lead Channel Sensing Intrinsic Amplitude: 8.5 mV
Lead Channel Sensing Intrinsic Amplitude: 8.5 mV
Lead Channel Setting Pacing Amplitude: 2.5 V
Lead Channel Setting Pacing Pulse Width: 0.4 ms
MDC IDC LEAD IMPLANT DT: 20141217
MDC IDC MSMT BATTERY VOLTAGE: 2.97 V
MDC IDC MSMT LEADCHNL RA PACING THRESHOLD PULSEWIDTH: 0.4 ms
MDC IDC MSMT LEADCHNL RV IMPEDANCE VALUE: 551 Ohm
MDC IDC SESS DTM: 20190917143853
MDC IDC SET LEADCHNL RA PACING AMPLITUDE: 2 V
MDC IDC SET LEADCHNL RV SENSING SENSITIVITY: 0.9 mV
MDC IDC STAT BRADY AP VP PERCENT: 22.78 %
MDC IDC STAT BRADY AS VP PERCENT: 68.04 %
MDC IDC STAT BRADY RA PERCENT PACED: 21.69 %

## 2018-05-18 ENCOUNTER — Other Ambulatory Visit: Payer: Self-pay | Admitting: Internal Medicine

## 2018-05-18 MED ORDER — FUROSEMIDE 20 MG PO TABS: ORAL_TABLET | ORAL | 3 refills | Status: DC

## 2018-05-18 NOTE — Telephone Encounter (Signed)
°*  STAT* If patient is at the pharmacy, call can be transferred to refill team.   1. Which medications need to be refilled? (please list name of each medication and dose if known) Furosamide 20 mg    2. Which pharmacy/location (including street and city if local pharmacy) is [facility]   3. Do they need a 30 day or 90 day supply? 90 day

## 2018-05-19 ENCOUNTER — Ambulatory Visit (INDEPENDENT_AMBULATORY_CARE_PROVIDER_SITE_OTHER): Payer: Medicare Other | Admitting: *Deleted

## 2018-05-19 ENCOUNTER — Encounter: Payer: Self-pay | Admitting: *Deleted

## 2018-05-19 ENCOUNTER — Telehealth: Payer: Self-pay | Admitting: *Deleted

## 2018-05-19 DIAGNOSIS — I4892 Unspecified atrial flutter: Secondary | ICD-10-CM

## 2018-05-19 DIAGNOSIS — Z950 Presence of cardiac pacemaker: Secondary | ICD-10-CM

## 2018-05-19 DIAGNOSIS — Z01812 Encounter for preprocedural laboratory examination: Secondary | ICD-10-CM

## 2018-05-19 NOTE — Telephone Encounter (Signed)
I called and spoke with the patient. She is aware she is currently still in atrial flutter. I have notified Dr. Caryl Comes of this as well. He recommends that the patient proceed with DCCV. I have confirmed with the patient that she has been taking eliquis 5 mg BID with no missed doses since her office visit on 04/28/18 with Dr. Caryl Comes.  The patient is agreeable with proceeding with her DCCV on Thursday 05/28/18 at 7:30 am. Verbal instructions for the procedure were given to the patient. Paper copy also mailed to the patient.   The patient will come in to the office on 05/26/18 for a BMP/ CBC to be done.    You are scheduled for a Cardioversion on Thursday 05/28/18 with Dr.Gollan.  Please arrive at the [place] [facility] at 6:30 a.m. on the day of your procedure.  DIET INSTRUCTIONS:  Nothing to eat or drink after midnight except your medications with a              sip of water.         1) Labs: Tuesday 05/26/18 at 10:00 am  2) Medications:  You may take all of your regular medications the morning of your procedure unless listed below:  - hold lasix (furosemide) the morning of your procedure  - hold janumet the morning of your procedure  3) Must have a responsible person to drive you home.  4) Bring a current list of your medications and current insurance cards.    If you have any questions after you get home, please call the office at [phone]

## 2018-05-19 NOTE — Telephone Encounter (Signed)
-----   Message from Shiela Mayer, RN sent at 05/19/2018 10:27 AM EDT ----- Beaulah Corin!!!  Sarahanne's still in AFL, persistent since 03/27/18.  Thanks, Tobin Chad ----- Message ----- From: Emily Filbert, RN Sent: 04/28/2018  12:47 PM EDT To: Emily Filbert, RN, Cv [facility] Device  Patient due to transmit 05/19/18 to see if she is still in a-flutter.  If so, will need DCCV.  I'm putting it on the schedule, just not in [facility].   Thanks!

## 2018-05-20 ENCOUNTER — Other Ambulatory Visit: Payer: Self-pay | Admitting: Internal Medicine

## 2018-05-20 MED ORDER — APIXABAN 5 MG PO TABS: 5.0000 mg | ORAL_TABLET | Freq: Two times a day (BID) | ORAL | 2 refills | Status: DC

## 2018-05-20 NOTE — Telephone Encounter (Signed)
Refill Request.  

## 2018-05-20 NOTE — Progress Notes (Signed)
Remote pacemaker transmission.   

## 2018-05-20 NOTE — Telephone Encounter (Signed)
*  STAT* If patient is at the pharmacy, call can be transferred to refill team.   1. Which medications need to be refilled? (please list name of each medication and dose if known) Eliquis 5 MG twice a day   2. Which pharmacy/location (including street and city if local pharmacy) [facility]    3. Do they need a 30 day or 90 day supply? 90 day

## 2018-05-21 ENCOUNTER — Other Ambulatory Visit: Payer: Self-pay | Admitting: Internal Medicine

## 2018-05-21 DIAGNOSIS — I4819 Other persistent atrial fibrillation: Secondary | ICD-10-CM

## 2018-05-25 ENCOUNTER — Other Ambulatory Visit: Payer: Self-pay | Admitting: Family Medicine

## 2018-05-25 DIAGNOSIS — M25552 Pain in left hip: Principal | ICD-10-CM

## 2018-05-25 DIAGNOSIS — R252 Cramp and spasm: Secondary | ICD-10-CM

## 2018-05-25 DIAGNOSIS — G8929 Other chronic pain: Secondary | ICD-10-CM

## 2018-05-25 MED ORDER — GABAPENTIN 300 MG PO CAPS: 300.0000 mg | ORAL_CAPSULE | Freq: Two times a day (BID) | ORAL | 3 refills | Status: DC

## 2018-05-25 NOTE — Telephone Encounter (Signed)
Pt called requesting refill on gabapentin 300 mg [facility]. Pt call back # is  [phone]

## 2018-05-27 ENCOUNTER — Encounter: Payer: Self-pay | Admitting: Anesthesiology

## 2018-05-27 ENCOUNTER — Other Ambulatory Visit (INDEPENDENT_AMBULATORY_CARE_PROVIDER_SITE_OTHER): Payer: Medicare Other

## 2018-05-27 DIAGNOSIS — I4892 Unspecified atrial flutter: Secondary | ICD-10-CM

## 2018-05-27 DIAGNOSIS — Z01812 Encounter for preprocedural laboratory examination: Secondary | ICD-10-CM

## 2018-05-28 ENCOUNTER — Ambulatory Visit: Payer: Medicare Other | Admitting: Anesthesiology

## 2018-05-28 ENCOUNTER — Ambulatory Visit
Admission: RE | Admit: 2018-05-28 | Discharge: 2018-05-28 | Disposition: A | Discharge status: Home / Self Care | Payer: Medicare Other | Source: Ambulatory Visit | Attending: Cardiovascular Disease | Admitting: Cardiovascular Disease

## 2018-05-28 ENCOUNTER — Encounter
Admission: RE | Disposition: A | Discharge status: Home / Self Care | Payer: Self-pay | Source: Ambulatory Visit | Attending: Cardiovascular Disease

## 2018-05-28 DIAGNOSIS — E119 Type 2 diabetes mellitus without complications: Secondary | ICD-10-CM | POA: Insufficient documentation

## 2018-05-28 DIAGNOSIS — N183 Chronic kidney disease, stage 3 (moderate): Secondary | ICD-10-CM | POA: Diagnosis not present

## 2018-05-28 DIAGNOSIS — E669 Obesity, unspecified: Secondary | ICD-10-CM | POA: Diagnosis not present

## 2018-05-28 DIAGNOSIS — Z6833 Body mass index (BMI) 33.0-33.9, adult: Secondary | ICD-10-CM | POA: Diagnosis not present

## 2018-05-28 DIAGNOSIS — Z7984 Long term (current) use of oral hypoglycemic drugs: Secondary | ICD-10-CM | POA: Diagnosis not present

## 2018-05-28 DIAGNOSIS — Z950 Presence of cardiac pacemaker: Secondary | ICD-10-CM | POA: Diagnosis not present

## 2018-05-28 DIAGNOSIS — Z79891 Long term (current) use of opiate analgesic: Secondary | ICD-10-CM | POA: Diagnosis not present

## 2018-05-28 DIAGNOSIS — K219 Gastro-esophageal reflux disease without esophagitis: Secondary | ICD-10-CM | POA: Diagnosis not present

## 2018-05-28 DIAGNOSIS — I4821 Permanent atrial fibrillation: Secondary | ICD-10-CM | POA: Insufficient documentation

## 2018-05-28 DIAGNOSIS — E1122 Type 2 diabetes mellitus with diabetic chronic kidney disease: Secondary | ICD-10-CM | POA: Diagnosis not present

## 2018-05-28 DIAGNOSIS — I483 Typical atrial flutter: Secondary | ICD-10-CM | POA: Diagnosis not present

## 2018-05-28 DIAGNOSIS — I509 Heart failure, unspecified: Secondary | ICD-10-CM | POA: Diagnosis not present

## 2018-05-28 DIAGNOSIS — M109 Gout, unspecified: Secondary | ICD-10-CM | POA: Diagnosis not present

## 2018-05-28 DIAGNOSIS — Z7901 Long term (current) use of anticoagulants: Secondary | ICD-10-CM | POA: Diagnosis not present

## 2018-05-28 DIAGNOSIS — E039 Hypothyroidism, unspecified: Secondary | ICD-10-CM | POA: Insufficient documentation

## 2018-05-28 DIAGNOSIS — I129 Hypertensive chronic kidney disease with stage 1 through stage 4 chronic kidney disease, or unspecified chronic kidney disease: Secondary | ICD-10-CM | POA: Diagnosis not present

## 2018-05-28 DIAGNOSIS — I110 Hypertensive heart disease with heart failure: Secondary | ICD-10-CM | POA: Insufficient documentation

## 2018-05-28 DIAGNOSIS — Z79899 Other long term (current) drug therapy: Secondary | ICD-10-CM | POA: Diagnosis not present

## 2018-05-28 DIAGNOSIS — N289 Disorder of kidney and ureter, unspecified: Secondary | ICD-10-CM | POA: Diagnosis not present

## 2018-05-28 DIAGNOSIS — Z87891 Personal history of nicotine dependence: Secondary | ICD-10-CM | POA: Insufficient documentation

## 2018-05-28 DIAGNOSIS — Z79890 Hormone replacement therapy (postmenopausal): Secondary | ICD-10-CM | POA: Diagnosis not present

## 2018-05-28 DIAGNOSIS — I4819 Other persistent atrial fibrillation: Secondary | ICD-10-CM

## 2018-05-28 DIAGNOSIS — I4891 Unspecified atrial fibrillation: Secondary | ICD-10-CM | POA: Diagnosis not present

## 2018-05-28 HISTORY — PX: CARDIOVERSION: EP1203

## 2018-05-28 LAB — BASIC METABOLIC PANEL
BUN / CREAT RATIO: 20 (ref 12–28)
BUN: 27 mg/dL (ref 8–27)
CHLORIDE: 99 mmol/L (ref 96–106)
CO2: 20 mmol/L (ref 20–29)
Calcium: 9.6 mg/dL (ref 8.7–10.3)
Creatinine, Ser: 1.35 mg/dL — ABNORMAL HIGH (ref 0.57–1.00)
GFR calc non Af Amer: 40 mL/min/{1.73_m2} — ABNORMAL LOW (ref >59)
GFR, EST AFRICAN AMERICAN: 46 mL/min/{1.73_m2} — AB (ref >59)
GLUCOSE: 123 mg/dL — AB (ref 65–99)
Potassium: 4.5 mmol/L (ref 3.5–5.2)
Sodium: 139 mmol/L (ref 134–144)

## 2018-05-28 LAB — CBC WITH DIFFERENTIAL/PLATELET
BASOS ABS: 0 10*3/uL (ref 0.0–0.2)
Basos: 0 %
EOS (ABSOLUTE): 0.1 10*3/uL (ref 0.0–0.4)
EOS: 2 %
HEMATOCRIT: 35.3 % (ref 34.0–46.6)
HEMOGLOBIN: 11.3 g/dL (ref 11.1–15.9)
IMMATURE GRANS (ABS): 0 10*3/uL (ref 0.0–0.1)
Immature Granulocytes: 0 %
LYMPHS ABS: 1.3 10*3/uL (ref 0.7–3.1)
LYMPHS: 29 %
MCH: 28 pg (ref 26.6–33.0)
MCHC: 32 g/dL (ref 31.5–35.7)
MCV: 88 fL (ref 79–97)
MONOCYTES: 10 %
Monocytes Absolute: 0.5 10*3/uL (ref 0.1–0.9)
Neutrophils Absolute: 2.7 10*3/uL (ref 1.4–7.0)
Neutrophils: 59 %
Platelets: 207 10*3/uL (ref 150–450)
RBC: 4.03 x10E6/uL (ref 3.77–5.28)
RDW: 15 % (ref 12.3–15.4)
WBC: 4.6 10*3/uL (ref 3.4–10.8)

## 2018-05-28 SURGERY — CARDIOVERSION (CATH LAB)
Anesthesia: General

## 2018-05-28 MED ORDER — EPHEDRINE SULFATE 50 MG/ML IJ SOLN
INTRAMUSCULAR | Status: AC
  Filled 2018-05-28: qty 1

## 2018-05-28 MED ORDER — PROPOFOL 10 MG/ML IV BOLUS
INTRAVENOUS | Status: DC | PRN
  Administered 2018-05-28: 80 mg via INTRAVENOUS

## 2018-05-28 MED ORDER — PROPOFOL 10 MG/ML IV BOLUS
INTRAVENOUS | Status: AC
  Filled 2018-05-28: qty 20

## 2018-05-28 MED ORDER — SODIUM CHLORIDE 0.9 % IV SOLN
INTRAVENOUS | Status: DC
  Administered 2018-05-28: 08:00:00 via INTRAVENOUS

## 2018-05-28 MED ORDER — PHENYLEPHRINE HCL 10 MG/ML IJ SOLN
INTRAMUSCULAR | Status: AC
  Filled 2018-05-28: qty 1

## 2018-05-28 NOTE — Anesthesia Post-op Follow-up Note (Signed)
Anesthesia QCDR form completed.        

## 2018-05-28 NOTE — CV Procedure (Signed)
Cardioversion procedure note For atrial flutter, typical  Procedure Details:  Consent: Risks of procedure as well as the alternatives and risks of each were explained to the (patient/caregiver). Consent for procedure obtained.  Time Out: Verified patient identification, verified procedure, site/side was marked, verified correct patient position, special equipment/implants available, medications/allergies/relevent history reviewed, required imaging and test results available. Performed  Patient placed on cardiac monitor, pulse oximetry, supplemental oxygen as necessary.  Sedation given: propofol IV, Dr. Marcello Moores Pacer pads placed anterior and posterior chest.   Cardioverted 1 time(s).  Cardioverted at  150 J. Synchronized biphasic Converted to NSR   Evaluation: Findings: Post procedure EKG shows: NSR Complications: None Patient did tolerate procedure well.  Time Spent Directly with the Patient:  50 minutes   Esmond Plants, M.D., Ph.D.

## 2018-05-28 NOTE — Discharge Instructions (Signed)
Chemical Cardioversion °Chemical cardioversion, also called pharmacologic cardioversion, is the use of medicine to make an abnormal heart rhythm normal again. You may have this treatment if you have a new abnormal heart rhythm such as atrial fibrillation, or if your abnormal heart rhythm is causing problems such as shortness of breath. °If this treatment is not successful, you may need to have a different kind of cardioversion called electrical cardioversion. Electrical cardioversion is the delivery of a jolt of electricity to restore a normal rhythm to the heart. °Tell a health care provider about: °· Any allergies you have. °· All medicines you are taking, including vitamins, herbs, eye drops, creams, and over-the-counter medicines. °· Any problems you or family members have had with anesthetic medicines. °· Any blood disorders you have. °· Any surgeries you have had. °· Any medical conditions you have. °· Whether you are pregnant or may be pregnant. °What are the risks? °Generally, this is a safe procedure. However, problems may occur, including: °· Worsening of your abnormal heart rhythm. °· An abnormal heart rhythm that is life-threatening. °· A stroke from a blood clot. ° °What happens before the procedure? °· Take over-the-counter and prescription medicines only as told by your health care provider. Your health care provider may have you start taking blood-thinning medicines (anticoagulants) so your blood does not clot easily. Your health care provider may also give you medicines to help stabilize your heart rhythm. °· Ask your health care provider about changing or stopping your regular medicines. This is especially important if you are taking diabetes medicines or blood thinners. °· You may have a test to look for blood clots in your heart (transesophageal echocardiogram, or TEE). In this test, a tube with an instrument is passed down the esophagus. Sound waves (ultrasound) are then used to produce very  clear, detailed images of the heart. °What happens during the procedure? °· You will be given one or more medicines by mouth or through an IV tube in your vein. The number of medicines will depend on your heart rhythm. You may take the medicines while you are at home, in a clinic, or in the hospital. °· Sticky patches (electrodes) or metal paddles may be placed on your chest to monitor your heart. °The procedure may vary among health care providers and hospitals. °What happens after the procedure? °· You may be asked to stay in the hospital so your heart rhythm can be monitored. °· Once your heart rhythm is normal again, you may need to take medicines to increase your chances of keeping a regular heart rhythm. °· Get help right away if: °? You experience side effects from medicines. °? Your heart rhythm changes. °? You have chest pain or shortness of breath. °? You have symptoms of a stroke, such as:. °§ Sudden weakness or numbness in your face, arm, or leg, especially on one side of your body. °§ Sudden trouble speaking, understanding, or both (aphasia). °§ Sudden trouble seeing with one or both eyes. °These symptoms may represent a serious problem that is an emergency. Do not wait to see if the symptoms will go away. Get medical help right away. Call your local emergency services (911 in the [place] Do not drive yourself to the hospital. °Summary °· Chemical cardioversion, also called pharmacologic cardioversion, is the use of medicine to make an abnormal heart rhythm normal again. °· You will be given one or more medicines by mouth or through an IV tube in your vein. °· Once your heart   rhythm is normal again, you may need to take medicines to increase your chances of keeping a regular heart rhythm. °This information is not intended to replace advice given to you by your health care provider. Make sure you discuss any questions you have with your health care provider. °Document Released: 05/13/2006 Document  Revised: 07/26/2016 Document Reviewed: 07/26/2016 °Elsevier Interactive Patient Education © 2017 Elsevier Inc. ° °

## 2018-05-28 NOTE — Anesthesia Preprocedure Evaluation (Addendum)
Anesthesia Evaluation  Patient identified by MRN, date of birth, ID band Patient awake    Reviewed: Allergy & Precautions, NPO status , Patient's Chart, lab work & pertinent test results, reviewed documented beta blocker date and time   Airway Mallampati: III  TM Distance: >3 FB     Dental  (+) Chipped, Upper Dentures, Partial Lower   Pulmonary former smoker,           Cardiovascular hypertension, Pt. on medications and Pt. on home beta blockers + dysrhythmias Atrial Fibrillation + pacemaker      Neuro/Psych CVA    GI/Hepatic GERD  Controlled,  Endo/Other  diabetes, Type 2Hypothyroidism   Renal/GU Renal disease     Musculoskeletal   Abdominal   Peds  Hematology  (+) anemia ,   Anesthesia Other Findings Obese. Gout. EKG shows paced. EF 60 2 yrs ago.  Reproductive/Obstetrics                            Anesthesia Physical Anesthesia Plan  ASA: III  Anesthesia Plan: General   Post-op Pain Management:    Induction: Intravenous  PONV Risk Score and Plan:   Airway Management Planned:   Additional Equipment:   Intra-op Plan:   Post-operative Plan:   Informed Consent: I have reviewed the patients History and Physical, chart, labs and discussed the procedure including the risks, benefits and alternatives for the proposed anesthesia with the patient or authorized representative who has indicated his/her understanding and acceptance.     Plan Discussed with: CRNA  Anesthesia Plan Comments:         Anesthesia Quick Evaluation

## 2018-05-28 NOTE — Transfer of Care (Signed)
Immediate Anesthesia Transfer of Care Note  Patient: Renee Boyd  Procedure(s) Performed: CARDIOVERSION (N/A )  Patient Location: Nursing Unit  Anesthesia Type:General  Level of Consciousness: drowsy and patient cooperative  Airway & Oxygen Therapy: Patient Spontanous Breathing and Patient connected to nasal cannula oxygen  Post-op Assessment: Report given to RN and Post -op Vital signs reviewed and stable  Post vital signs: Reviewed and stable  Last Vitals:  Vitals Value Taken Time  BP 110/64 05/28/2018  7:43 AM  Temp    Pulse 80 05/28/2018  7:46 AM  Resp 14 05/28/2018  7:46 AM  SpO2 96 % 05/28/2018  7:46 AM  Vitals shown include unvalidated device data.  Last Pain:  Vitals:   05/28/18 0636  TempSrc: Oral  PainSc: 0-No pain         Complications: No apparent anesthesia complications

## 2018-05-28 NOTE — Anesthesia Postprocedure Evaluation (Signed)
Anesthesia Post Note  Patient: Renee Boyd  Procedure(s) Performed: CARDIOVERSION (N/A )  Patient location during evaluation: Cath Lab Anesthesia Type: General Level of consciousness: awake and alert Pain management: pain level controlled Vital Signs Assessment: post-procedure vital signs reviewed and stable Respiratory status: spontaneous breathing, nonlabored ventilation, respiratory function stable and patient connected to nasal cannula oxygen Cardiovascular status: blood pressure returned to baseline and stable Postop Assessment: no apparent nausea or vomiting Anesthetic complications: no     Last Vitals:  Vitals:   05/28/18 0746 05/28/18 0800  BP: (!) 102/58 123/64  Pulse: 80 61  Resp: 14 16  Temp:    SpO2: 96% 98%    Last Pain:  Vitals:   05/28/18 0636  TempSrc: Oral  PainSc: 0-No pain                 [name] S

## 2018-05-29 ENCOUNTER — Other Ambulatory Visit: Payer: Self-pay | Admitting: Internal Medicine

## 2018-05-29 ENCOUNTER — Other Ambulatory Visit: Payer: Self-pay

## 2018-05-29 DIAGNOSIS — I10 Essential (primary) hypertension: Secondary | ICD-10-CM

## 2018-05-29 MED ORDER — AMLODIPINE BESYLATE 10 MG PO TABS: 10.0000 mg | ORAL_TABLET | Freq: Every day | ORAL | 3 refills | Status: DC

## 2018-05-29 NOTE — Telephone Encounter (Signed)
Patient was given refills by Dr. Parks Ranger for amlodipine in Aug. 2019.

## 2018-05-29 NOTE — Telephone Encounter (Signed)
*  STAT* If patient is at the pharmacy, call can be transferred to refill team.   1. Which medications need to be refilled? (please list name of each medication and dose if known) amlodipine  2. Which pharmacy/location (including street and city if local pharmacy) is [facility]  3. Do they need a 30 day or 90 day supply? [place]

## 2018-05-29 NOTE — Telephone Encounter (Signed)
°*  STAT* If patient is at the pharmacy, call can be transferred to refill team.   1. Which medications need to be refilled? (please list name of each medication and dose if known) Amlodipine 10 mg po q d   2. Which pharmacy/location (including street and city if local pharmacy) is [facility]   3. Do they need a 30 day or 90 day supply? [place]

## 2018-05-31 NOTE — H&P (Signed)
H&P Addendum, pre-cardioversion  Patient was seen and evaluated prior to -cardioversion procedure Symptoms, prior testing details again confirmed with the patient Patient examined, no significant change from prior exam Lab work reviewed in detail personally by myself Patient understands risk and benefit of the procedure, willing to proceed  Signed, Tim Callee Rohrig, MD, Ph.D [facility]  

## 2018-06-01 ENCOUNTER — Other Ambulatory Visit: Payer: Self-pay | Admitting: Family Medicine

## 2018-06-01 ENCOUNTER — Telehealth: Payer: Self-pay | Admitting: Family Medicine

## 2018-06-01 DIAGNOSIS — L509 Urticaria, unspecified: Secondary | ICD-10-CM

## 2018-06-01 DIAGNOSIS — E790 Hyperuricemia without signs of inflammatory arthritis and tophaceous disease: Secondary | ICD-10-CM

## 2018-06-01 MED ORDER — DESONIDE 0.05 % EX LOTN: 1.0000 "application " | TOPICAL_LOTION | Freq: Two times a day (BID) | CUTANEOUS | 3 refills | Status: DC | PRN

## 2018-06-01 MED ORDER — ALLOPURINOL 100 MG PO TABS: 100.0000 mg | ORAL_TABLET | Freq: Every day | ORAL | 3 refills | Status: DC

## 2018-06-01 NOTE — Telephone Encounter (Signed)
Refilled  Renee Putnam, DO [facility] 06/01/2018, 5:38 PM

## 2018-06-01 NOTE — Telephone Encounter (Signed)
Pt called requesting refill for allopurinol 100 mg 90 day supply, Desonide 0.05  [facility] . Pt. Call back # is  [phone]

## 2018-06-03 ENCOUNTER — Other Ambulatory Visit: Payer: Self-pay

## 2018-06-03 ENCOUNTER — Telehealth: Payer: Self-pay | Admitting: Family Medicine

## 2018-06-03 DIAGNOSIS — E039 Hypothyroidism, unspecified: Secondary | ICD-10-CM

## 2018-06-03 MED ORDER — LEVOTHYROXINE SODIUM 25 MCG PO TABS: 25.0000 ug | ORAL_TABLET | Freq: Every day | ORAL | 1 refills | Status: DC

## 2018-06-03 NOTE — Telephone Encounter (Signed)
Rx send

## 2018-06-03 NOTE — Telephone Encounter (Signed)
Pt called requesting refill on levothyroxine  25 MCG 90 day supply  [facility]

## 2018-06-11 ENCOUNTER — Ambulatory Visit: Payer: Medicare Other | Admitting: Physician Assistant

## 2018-06-11 DIAGNOSIS — E875 Hyperkalemia: Secondary | ICD-10-CM | POA: Diagnosis not present

## 2018-06-11 DIAGNOSIS — N184 Chronic kidney disease, stage 4 (severe): Secondary | ICD-10-CM | POA: Diagnosis not present

## 2018-06-11 DIAGNOSIS — R8290 Unspecified abnormal findings in urine: Secondary | ICD-10-CM | POA: Diagnosis not present

## 2018-06-11 DIAGNOSIS — I129 Hypertensive chronic kidney disease with stage 1 through stage 4 chronic kidney disease, or unspecified chronic kidney disease: Secondary | ICD-10-CM | POA: Diagnosis not present

## 2018-06-11 DIAGNOSIS — E1122 Type 2 diabetes mellitus with diabetic chronic kidney disease: Secondary | ICD-10-CM | POA: Diagnosis not present

## 2018-06-12 ENCOUNTER — Other Ambulatory Visit: Payer: Self-pay | Admitting: Family Medicine

## 2018-06-12 DIAGNOSIS — E1165 Type 2 diabetes mellitus with hyperglycemia: Principal | ICD-10-CM

## 2018-06-12 DIAGNOSIS — E1122 Type 2 diabetes mellitus with diabetic chronic kidney disease: Secondary | ICD-10-CM

## 2018-06-12 DIAGNOSIS — IMO0002 Reserved for concepts with insufficient information to code with codable children: Secondary | ICD-10-CM

## 2018-06-12 MED ORDER — SITAGLIPTIN PHOS-METFORMIN HCL 50-500 MG PO TABS: 1.0000 | ORAL_TABLET | Freq: Two times a day (BID) | ORAL | 1 refills | Status: DC

## 2018-06-12 NOTE — Telephone Encounter (Signed)
Refilled and patient notified 

## 2018-06-12 NOTE — Telephone Encounter (Signed)
Pt. Called requesting refill on janumet 50-500 mg 90 day supply [facility]

## 2018-06-13 LAB — CUP PACEART REMOTE DEVICE CHECK
Battery Voltage: 2.97 V
Brady Statistic AP VS Percent: 0 %
Brady Statistic AS VS Percent: 3.7 %
Brady Statistic RA Percent Paced: 0.01 %
Brady Statistic RV Percent Paced: 95.03 %
Date Time Interrogation Session: 20191015135019
Implantable Lead Implant Date: 20141217
Implantable Lead Location: 753860
Implantable Lead Model: 5076
Implantable Pulse Generator Implant Date: 20141217
Lead Channel Impedance Value: 380 Ohm
Lead Channel Pacing Threshold Amplitude: 0.375 V
Lead Channel Pacing Threshold Pulse Width: 0.4 ms
Lead Channel Pacing Threshold Pulse Width: 0.4 ms
Lead Channel Sensing Intrinsic Amplitude: 3 mV
Lead Channel Setting Pacing Amplitude: 2 V
Lead Channel Setting Sensing Sensitivity: 0.9 mV
MDC IDC LEAD IMPLANT DT: 20141217
MDC IDC LEAD LOCATION: 753859
MDC IDC MSMT BATTERY REMAINING LONGEVITY: 37 mo
MDC IDC MSMT LEADCHNL RA IMPEDANCE VALUE: 418 Ohm
MDC IDC MSMT LEADCHNL RA SENSING INTR AMPL: 3 mV
MDC IDC MSMT LEADCHNL RV IMPEDANCE VALUE: 513 Ohm
MDC IDC MSMT LEADCHNL RV IMPEDANCE VALUE: 551 Ohm
MDC IDC MSMT LEADCHNL RV PACING THRESHOLD AMPLITUDE: 0.5 V
MDC IDC MSMT LEADCHNL RV SENSING INTR AMPL: 11 mV
MDC IDC MSMT LEADCHNL RV SENSING INTR AMPL: 11 mV
MDC IDC SET LEADCHNL RV PACING AMPLITUDE: 2.5 V
MDC IDC SET LEADCHNL RV PACING PULSEWIDTH: 0.4 ms
MDC IDC STAT BRADY AP VP PERCENT: 0.01 %
MDC IDC STAT BRADY AS VP PERCENT: 96.29 %

## 2018-06-17 DIAGNOSIS — I129 Hypertensive chronic kidney disease with stage 1 through stage 4 chronic kidney disease, or unspecified chronic kidney disease: Secondary | ICD-10-CM | POA: Diagnosis not present

## 2018-06-17 DIAGNOSIS — R809 Proteinuria, unspecified: Secondary | ICD-10-CM | POA: Diagnosis not present

## 2018-06-17 DIAGNOSIS — D631 Anemia in chronic kidney disease: Secondary | ICD-10-CM | POA: Diagnosis not present

## 2018-06-17 DIAGNOSIS — N183 Chronic kidney disease, stage 3 (moderate): Secondary | ICD-10-CM | POA: Diagnosis not present

## 2018-06-18 ENCOUNTER — Encounter: Payer: Self-pay | Admitting: Physician Assistant

## 2018-06-18 ENCOUNTER — Encounter

## 2018-06-18 ENCOUNTER — Ambulatory Visit: Payer: Medicare Other | Admitting: Nurse Practitioner

## 2018-06-18 NOTE — Progress Notes (Signed)
Cardiology Office Note Date:  06/19/2018  Patient ID:  Renee Boyd, Renee Boyd 01-Aug-1951, MRN [id number] PCP:  Olin Hauser, DO  Cardiologist:  Dr. Caryl Comes, MD    Chief Complaint: Follow up DCCV  History of Present Illness: Renee Boyd is a 67 y.o. female with history of persistent Afib on Eliquis, complete heart block s/p MDT PPM, pulmonary hypertension, CKD stage III, TIA, DM2, HTN, and hypothyroidism who presents for follow up of recent DCCV on 05/28/2018.   Patient initially underwent PPM implantation in 2006 in the setting of syncope with device generator replacement in 2014, now with complete heart block per notes. She established with Dr. Caryl Comes in 2017. Most recent echo from 2017, showed an EF of 60-65%, no RWMA, mildly dilated left atrium, RVSF normal, PASP 64 mmHg. Her Afib dates back back to at least 2017. For the most part, notes indicate she has been relatively asymptomatic from her Afib. She was started on Eliquis upon establishing with Dr. Caryl Comes in 2017.   She was most recently seen by Dr. Caryl Comes in 04/2018 noting issues with volume overload in the recent past, ultimately being diuresed by PCP, complicated by gout. It was noted she has not undergone sleep study secondary to concern for hair oils. It was unclear if her heart failure symptoms were in the setting of atrial arrhythmias, and less symptomatic when she was in sinus rhythm. Because of this, DCCV was recommended. However, this had to be deferred until she was adequately anticoagulated as she had missed some Eliquis. She was also felt to be volume up at her visit in 04/2018 with a weight of 168 pounds. Her Lasix was increased from 40 mg daily to 60 mg daily for 3 days. She ultimately underwent successful DCCV on 05/28/2018 with conversion to sinus rhythm.   Labs:  05/2018 - SCr 1.35, K+ 4.5, CBC unremarkable   She comes in doing reasonably well from a cardiac perspective today.  Since her cardioversion she has noted  intermittent tachypalpitations.  No chest pain or shortness of breath.  She continues to note intermittent lower extremity edema.  She states she tries to eat the right that is what "I am not a [place]."  Patient had [employer] before dinner last night.  She plans to eat a salty lunch at church this upcoming Sunday as it is her pastors anniversary.  Her weight is down 2 pounds from her last office visit on 9/24.  She is now back on Lasix 40 mg daily.  She has been compliant with Eliquis taking this twice daily without missing any doses.  No falls since she was last seen.  No BRBPR or melena.  She continues to be hesitant regarding sleep study as she is worried about her hair and she also notes every 2-3 nights she "does not sleep at all."   Past Medical History:  Diagnosis Date  . Chronic kidney disease (CKD), stage III (moderate) (HCC)   . Complete heart block ([place])    a. s/p MDT PPM 2006 with device generator replacement 2014; b. followed by Dr. Caryl Comes  . Diabetes mellitus without complication ([place])   . GERD (gastroesophageal reflux disease)   . Hypertension   . Hypothyroid   . Pacemaker   . Persistent atrial fibrillation    a. on Eliquis; b. CHADS2VASc 6 (HTN. age x 1, DM, TIA x 2, female); c. s/p DCCV 05/28/18  . Pulmonary hypertension ([place])    a. echo 2017: EF of 60-65%, no  RWMA, mildly dilated left atrium, RVSF normal, PASP 64 mmHg  . TIA (transient ischemic attack)     Past Surgical History:  Procedure Laterality Date  . ABDOMINAL HYSTERECTOMY    . CARDIOVERSION N/A 05/28/2018   Procedure: CARDIOVERSION;  Surgeon: Minna Merritts, MD;  Location: [facility] ORS;  Service: Cardiovascular;  Laterality: N/A;  . CHOLECYSTECTOMY    . TONSILLECTOMY    . TUBAL LIGATION      Current Meds  Medication Sig  . allopurinol (ZYLOPRIM) 100 MG tablet Take 1 tablet (100 mg total) by mouth daily.  Marland Kitchen amLODipine (NORVASC) 10 MG tablet Take 1 tablet (10 mg total) by mouth daily.  Marland Kitchen apixaban (ELIQUIS) 5 MG  TABS tablet Take 1 tablet (5 mg total) by mouth 2 (two) times daily.  Marland Kitchen atorvastatin (LIPITOR) 20 MG tablet Take 1 tablet (20 mg total) by mouth daily.  . Baclofen 5 MG TABS Take 5 mg by mouth at bedtime.  . cholecalciferol (VITAMIN D3) 25 MCG (1000 UT) tablet Take 1,000 Units by mouth daily.  Marland Kitchen desonide (DESOWEN) 0.05 % lotion Apply 1 application topically 2 (two) times daily as needed. For up to 1 week max, caution if using on face.  . furosemide (LASIX) 20 MG tablet TAKE 2 TABLETS BY MOUTH EVERY MORNING (Patient taking differently: Take 40 mg by mouth daily. )  . gabapentin (NEURONTIN) 300 MG capsule Take 1 capsule (300 mg total) by mouth 2 (two) times daily.  Marland Kitchen glucose blood test strip Check blood sugar twice daily as instructed  . ibandronate (BONIVA) 150 MG tablet Take 1 tablet (150 mg total) by mouth every 30 (thirty) days. Take in the morning with a full glass of water, on an empty stomach  . levothyroxine (SYNTHROID, LEVOTHROID) 25 MCG tablet Take 1 tablet (25 mcg total) by mouth daily before breakfast.  . lisinopril (PRINIVIL,ZESTRIL) 20 MG tablet Take 1 tablet (20 mg total) by mouth daily.  . metoprolol succinate (TOPROL-XL) 100 MG 24 hr tablet Take 1 tablet (100 mg total) by mouth daily. Take with or immediately following a meal.  . omeprazole (PRILOSEC) 20 MG capsule TAKE 1 CAPSULE BY MOUTH TWICE A DAY BEFORE A MEAL (Patient taking differently: Take 20 mg by mouth 2 (two) times daily before a meal. )  . sitaGLIPtin-metformin (JANUMET) 50-500 MG tablet Take 1 tablet by mouth 2 (two) times daily with a meal.  . temazepam (RESTORIL) 30 MG capsule TAKE 1 CAPSULE BY MOUTH AT BEDTIME AS NEEDED SLEEP (Patient taking differently: Take 30 mg by mouth at bedtime as needed for sleep. )  . traMADol (ULTRAM) 50 MG tablet Take 1 tablet (50 mg total) by mouth 2 (two) times daily.    Allergies:   Morphine and related and Percocet [oxycodone-acetaminophen]   Social History:  The patient  reports that  she quit smoking about 12 years ago. Her smoking use included cigarettes. She has a 80.00 pack-year smoking history. She has quit using smokeless tobacco. She reports that she does not drink alcohol or use drugs.   Family History:  The patient's family history includes Alcohol abuse in her father; Autism in her son; Cancer in her son; Heart disease in her maternal grandmother and mother; Hypertension in her other; 60 / Stillbirths in her maternal aunt.  ROS:   Review of Systems  Constitutional: Positive for malaise/fatigue. Negative for chills, diaphoresis, fever and weight loss.  HENT: Negative for congestion.   Eyes: Negative for discharge and redness.  Respiratory: Positive for  shortness of breath. Negative for cough, hemoptysis, sputum production and wheezing.   Cardiovascular: Positive for palpitations and leg swelling. Negative for chest pain, orthopnea, claudication and PND.  Gastrointestinal: Negative for abdominal pain, blood in stool, heartburn, melena, nausea and vomiting.  Genitourinary: Negative for hematuria.  Musculoskeletal: Negative for falls and myalgias.  Skin: Negative for rash.  Neurological: Positive for weakness. Negative for dizziness, tingling, tremors, sensory change, speech change, focal weakness and loss of consciousness.  Endo/Heme/Allergies: Does not bruise/bleed easily.  Psychiatric/Behavioral: Negative for substance abuse. The patient is not nervous/anxious.   All other systems reviewed and are negative.    PHYSICAL EXAM:  VS:  BP 140/82 (BP Location: Left Arm, Patient Position: Sitting, Cuff Size: Normal)   Pulse 85   Ht 4\' 11"  (1.499 m)   Wt 166 lb (75.3 kg)   BMI 33.53 kg/m  BMI: Body mass index is 33.53 kg/m.  Physical Exam  Constitutional: She is oriented to person, place, and time. She appears well-developed and well-nourished.  HENT:  Head: Normocephalic and atraumatic.  Eyes: Right eye exhibits no discharge. Left eye exhibits no  discharge.  Neck: Normal range of motion. No JVD present.  Cardiovascular: Normal rate, S1 normal, S2 normal and normal heart sounds. An irregular rhythm present. Exam reveals no distant heart sounds, no friction rub, no midsystolic click and no opening snap.  No murmur heard. Pulses:      Posterior tibial pulses are 2+ on the right side, and 2+ on the left side.  Pulmonary/Chest: Effort normal and breath sounds normal. No respiratory distress. She has no decreased breath sounds. She has no wheezes. She has no rales. She exhibits no tenderness.  Abdominal: Soft. She exhibits no distension. There is no tenderness.  Musculoskeletal: She exhibits edema.  Trace bilateral pretibial edema  Neurological: She is alert and oriented to person, place, and time.  Skin: Skin is warm and dry. No cyanosis. Nails show no clubbing.  Psychiatric: She has a normal mood and affect. Her speech is normal and behavior is normal. Judgment and thought content normal.     EKG:  Was ordered and interpreted by me today. Shows atrial flutter with occasional PVCs, 85 bpm, nonspecific IVCD -reviewed with Dr. Rockey Situ  Recent Labs: 09/16/2017: ALT 10 10/24/2017: B Natriuretic Peptide 31.0 05/27/2018: BUN 27; Creatinine, Ser 1.35; Hemoglobin 11.3; Platelets 207; Potassium 4.5; Sodium 139  No results found for requested labs within last 8760 hours.   CrCl cannot be calculated (Patient's most recent lab result is older than the maximum 21 days allowed.).   Wt Readings from Last 3 Encounters:  06/19/18 166 lb (75.3 kg)  05/28/18 168 lb (76.2 kg)  04/28/18 168 lb 12 oz (76.5 kg)     Other studies reviewed: Additional studies/records reviewed today include: summarized above  ASSESSMENT AND PLAN:  1. Persistent A. Fib/flutter: Unfortunately, patient appears to be back in atrial flutter with variable AV block with occasional PVCs on EKG today.  This was reviewed with Dr. Rockey Situ.  Patient has also noted some palpitations  since her cardioversion.  We will obtain an echocardiogram as outlined below.  Continue Eliquis 5 mg twice daily as well as Toprol-XL 100 mg daily.  She will be reevaluated by EP for possible antiarrhythmic therapy as noted at their last office visit and 04/2018.  2. Pulmonary hypertension: Appears well compensated today.  Check echocardiogram to evaluate LV systolic function and pulmonary pressures.  Now back on Lasix 40 mg daily.  She declines  labs at this time indicating her nephrologist recently checked them.  We will try to obtain these records.  3. High-grade AV block status post Medtronic PPM: Followed by EP.  4. Hypertension: Blood pressure is reasonably controlled today.  Continue current medications.  Some of her lower extremity swelling may be in the setting of amlodipine 10 mg daily.  Consider transitioning off of amlodipine and onto alternative antihypertensive therapy.  5. Hyperlipidemia: LDL of 47 from 06/2017.  Followed by PCP.  Remains on Lipitor 20 mg daily.  6. Possible sleep apnea: Again stressed the importance of sleep study and explained its role with her atrial arrhythmias.  Patient is still on the fence if she would like to proceed with a sleep study.  Unfortunately, she does have untreated sleep apnea the likelihood of being able to adequately/successfully control her atrial arrhythmia burden is significantly decreased.  Disposition: F/u with Dr. Caryl Comes in 2 to 4 weeks.  Current medicines are reviewed at length with the patient today.  The patient did not have any concerns regarding medicines.  Melvern Banker PA-C 06/19/2018 4:02 PM     [address] [place], [place] [address] [phone]

## 2018-06-19 ENCOUNTER — Ambulatory Visit (INDEPENDENT_AMBULATORY_CARE_PROVIDER_SITE_OTHER): Payer: Medicare Other | Admitting: Physician Assistant

## 2018-06-19 ENCOUNTER — Encounter: Payer: Self-pay | Admitting: Physician Assistant

## 2018-06-19 VITALS — BP 140/82 | HR 85 | Ht 59.0 in | Wt 166.0 lb

## 2018-06-19 DIAGNOSIS — R0602 Shortness of breath: Secondary | ICD-10-CM | POA: Diagnosis not present

## 2018-06-19 DIAGNOSIS — I442 Atrioventricular block, complete: Secondary | ICD-10-CM

## 2018-06-19 DIAGNOSIS — G4730 Sleep apnea, unspecified: Secondary | ICD-10-CM

## 2018-06-19 DIAGNOSIS — I4819 Other persistent atrial fibrillation: Secondary | ICD-10-CM | POA: Diagnosis not present

## 2018-06-19 DIAGNOSIS — Z950 Presence of cardiac pacemaker: Secondary | ICD-10-CM | POA: Diagnosis not present

## 2018-06-19 DIAGNOSIS — I10 Essential (primary) hypertension: Secondary | ICD-10-CM | POA: Diagnosis not present

## 2018-06-19 DIAGNOSIS — E782 Mixed hyperlipidemia: Secondary | ICD-10-CM | POA: Diagnosis not present

## 2018-06-19 DIAGNOSIS — I272 Pulmonary hypertension, unspecified: Secondary | ICD-10-CM

## 2018-06-19 DIAGNOSIS — I4892 Unspecified atrial flutter: Secondary | ICD-10-CM

## 2018-06-19 NOTE — Patient Instructions (Signed)
Medication Instructions:  Your physician recommends that you continue on your current medications as directed. Please refer to the Current Medication list given to you today.  If you need a refill on your cardiac medications before your next appointment, please call your pharmacy.   Lab work: None  If you have labs (blood work) drawn today and your tests are completely normal, you will receive your results only by: Marland Kitchen MyChart Message (if you have MyChart) OR . A paper copy in the mail If you have any lab test that is abnormal or we need to change your treatment, we will call you to review the results.  Testing/Procedures: 1- Your physician has requested that you have an echocardiogram. Echocardiography is a painless test that uses sound waves to create images of your heart. It provides your doctor with information about the size and shape of your heart and how well your heart's chambers and valves are working. This procedure takes approximately one hour. There are no restrictions for this procedure.    Follow-Up: At [facility], you and your health needs are our priority.  As part of our continuing mission to provide you with exceptional heart care, we have created designated Provider Care Teams.  These Care Teams include your primary Cardiologist (physician) and Advanced Practice Providers (APPs -  Physician Assistants and Nurse Practitioners) who all work together to provide you with the care you need, when you need it. You will need a follow up appointment in 2-4  weeks.   You may see Dr. Jens Som or one of the following Advanced Practice Providers on your designated Care Team:   Murray Hodgkins, NP Christell Faith, PA-C . Marrianne Mood, PA-C

## 2018-07-01 ENCOUNTER — Encounter: Payer: Self-pay | Admitting: Family Medicine

## 2018-07-01 ENCOUNTER — Ambulatory Visit (INDEPENDENT_AMBULATORY_CARE_PROVIDER_SITE_OTHER): Payer: Medicare Other | Admitting: Family Medicine

## 2018-07-01 VITALS — BP 138/65 | HR 68 | Temp 97.6°F | Resp 16 | Ht 59.0 in | Wt 168.0 lb

## 2018-07-01 DIAGNOSIS — E1121 Type 2 diabetes mellitus with diabetic nephropathy: Secondary | ICD-10-CM | POA: Diagnosis not present

## 2018-07-01 DIAGNOSIS — M7552 Bursitis of left shoulder: Secondary | ICD-10-CM | POA: Diagnosis not present

## 2018-07-01 LAB — POCT GLYCOSYLATED HEMOGLOBIN (HGB A1C): Hemoglobin A1C: 7.1 % — AB (ref 4.0–5.6)

## 2018-07-01 MED ORDER — LIDOCAINE HCL (PF) 1 % IJ SOLN
4.0000 mL | Freq: Once | INTRAMUSCULAR | Status: AC
  Administered 2018-07-01: 4 mL

## 2018-07-01 MED ORDER — METHYLPREDNISOLONE ACETATE 40 MG/ML IJ SUSP
40.0000 mg | Freq: Once | INTRAMUSCULAR | Status: AC
  Administered 2018-07-01: 40 mg via INTRA_ARTICULAR

## 2018-07-01 NOTE — Patient Instructions (Addendum)
Thank you for coming to the office today.  Improved sugar A1c - keep up the great work. No changes to medicines today.  Recent Labs    12/19/17 1559 03/23/18 1003 07/01/18 1133  HGBA1C 10.5* 7.8* 7.1*   You received a Left Shoulder Joint steroid injection today. - Lidocaine numbing medicine may ease the pain initially for a few hours until it wears off - As discussed, you may experience a "steroid flare" this evening or within 24-48 hours, anytime medicine is injected into an inflamed joint it can cause the pain to get worse temporarily - Everyone responds differently to these injections, it depends on the patient and the severity of the joint problem, it may provide anywhere from days to weeks, to months of relief. Ideal response is >6 months relief - Try to take it easy for next 1-2 days, avoid over activity and strain on joint (limit lifting for shoulder) - Recommend the following:   - For swelling - rest, compression sleeve / ACE wrap, elevation, and ice packs as needed for first few days   - For pain in future may use heating pad or moist heat as needed  If not improving as expected over next several weeks, please follow-up sooner for re-evaluation.   Please schedule a Follow-up Appointment to: Return in about 6 months (around 12/30/2018) for 6 months for lab review - DM, HLD, Shoulder pain.  If you have any other questions or concerns, please feel free to call the office or send a message through [place]. You may also schedule an earlier appointment if necessary.  Additionally, you may be receiving a survey about your experience at our office within a few days to 1 week by e-mail or mail. We value your feedback.  [name], DO [place]

## 2018-07-01 NOTE — Progress Notes (Signed)
Subjective:    Patient ID: Renee Boyd, female    DOB: [birth year], 67 y.o.   MRN: [id number]  Renee Boyd is a 67 y.o. female presenting on 07/01/2018 for Diabetes and Shoulder Pain (L shoulder bursitis)   HPI  Specialists: Cardiology - Dr Virl Axe (Cardiology, EP), pacemaker checks Nephrology - Dr Lavonia Dana ([place]) Podiatry - Dr Daylene Katayama [facility])  CHRONIC DM, Type 2 with CKD-III Improved A1c from 10.5 to 7.8, now down to 7.1 on A1c CBGs: does not have cbg log today Meds: Janumet 50-500mg  BID Reports good compliance. Tolerating well w/o side-effects Currently on ACEi - per renal Cr improved. Lifestyle: Wt stable - Diet (diet improved) - Exercise (walking regularly for exercise) [facility] - updated DM Eye exam in 01/2018 Denies hypoglycemia, polyuria, visual changes, numbness or tingling.  Left Shoulder Pain / Chronic Bursitis Reports recurrent issue now more recently flare up worsening L shoulder pain radiating into elbow, and also some discomfort and pain and numbness tingling in L hand after IV insertion issue - see below - Chart review shows that she was treated for rotator cuff tendonitis in past, previous PCP back in 2018 she received L shoulder steroid injection with good results almost lasting 1 year with pain relief - Patient describes when she had flare up in past, she has difficulty lifting her arm / shoulder above her head - Admits it can wake her up at night if lay on L shoulder - Has not had X-ray in shoulder - She takes her Tylenol Arthritis strength PRN and Tramadol PRN infrequently - Denies any fall or injury swelling or redness  History of CVA / History of Gout  Health Maintenance: Declined mammogram at this time.  Due for Flu Shot, declines today despite counseling on benefits   Depression screen [facility] 2/9 07/01/2018 03/27/2018 12/19/2017  Decreased Interest 0 0 0  Down, Depressed, Hopeless 0 0 0  PHQ - 2 Score 0 0 0  Altered  sleeping - - -  Tired, decreased energy - - -  Change in appetite - - -  Feeling bad or failure about yourself  - - -  Trouble concentrating - - -  Moving slowly or fidgety/restless - - -  Suicidal thoughts - - -  PHQ-9 Score - - -    Social History   Tobacco Use  . Smoking status: Former Smoker    Packs/day: 2.00    Years: 40.00    Pack years: 80.00    Types: Cigarettes    Last attempt to quit: 06/27/2005    Years since quitting: 13.0  . Smokeless tobacco: Former Systems developer  . Tobacco comment: quit Jun 27 2005  Substance Use Topics  . Alcohol use: No  . Drug use: No    Review of Systems Per HPI unless specifically indicated above     Objective:    BP 138/65   Pulse 68   Temp 97.6 F (36.4 C) (Oral)   Resp 16   Ht 4\' 11"  (1.499 m)   Wt 168 lb (76.2 kg)   BMI 33.93 kg/m   Wt Readings from Last 3 Encounters:  07/01/18 168 lb (76.2 kg)  06/19/18 166 lb (75.3 kg)  05/28/18 168 lb (76.2 kg)    Physical Exam  Constitutional: She is oriented to person, place, and time. She appears well-developed and well-nourished. No distress.  Well-appearing, comfortable, cooperative  HENT:  Head: Normocephalic and atraumatic.  Mouth/Throat: Oropharynx is clear and moist.  Eyes: Conjunctivae are normal. Right eye exhibits no discharge. Left eye exhibits no discharge.  Neck: Normal range of motion. Neck supple. No thyromegaly present.  Cardiovascular: Normal rate, regular rhythm, normal heart sounds and intact distal pulses.  No murmur heard. Pulmonary/Chest: Effort normal and breath sounds normal. No respiratory distress. She has no wheezes. She has no rales.  Musculoskeletal: She exhibits no edema.  Left Shoulder Inspection: Normal appearance bilateral symmetrical Palpation: Non-tender to palpation over anterior, lateral, or posterior shoulder  ROM: Limited ROM above shoulder and abduction, flexion. Intact behind back int rotation Special Testing: Rotator cuff testing positive for  pain otherwise without significant weakness. Hawkin's AC impingement positive for pain Strength: Normal strength 5/5 flex/ext, ext rot / int rot, grip, rotator cuff str testing. Neurovascular: Distally intact pulses, sensation to light touch   Lymphadenopathy:    She has no cervical adenopathy.  Neurological: She is alert and oriented to person, place, and time.  Skin: Skin is warm and dry. No rash noted. She is not diaphoretic. No erythema.  Psychiatric: She has a normal mood and affect. Her behavior is normal.  Well groomed, good eye contact, normal speech and thoughts  Nursing note and vitals reviewed.    ________________________________________________________ PROCEDURE NOTE Date: 07/01/18 Left Shoulder Subacromial injection Discussed benefits and risks (including pain, bleeding, infection, steroid flare). Verbal consent given by patient. Medication:  1 cc Depo-medrol 40mg  and 4 cc Lidocaine 1% without epi Time Out taken  Landmarks identified. Area cleansed with alcohol wipes. Using 21 gauge and 1, 1/2 inch needle, Left subacromial bursa space was injected (with above listed medication) via posterior approach cold spray used for superficial anesthetic. Sterile bandage placed. Patient tolerated procedure well without bleeding or paresthesias. No complications.   Recent Labs    12/19/17 1559 03/23/18 1003 07/01/18 1133  HGBA1C 10.5* 7.8* 7.1*    Results for orders placed or performed in visit on 07/01/18  POCT HgB A1C  Result Value Ref Range   Hemoglobin A1C 7.1 (A) 4.0 - 5.6 %      Assessment & Plan:   Problem List Items Addressed This Visit    Chronic bursitis of left shoulder    Consistent with subacute on chronic L-shoulder bursitis vs rotator cuff tendinopathy with some reduced active ROM but without significant evidence of muscle tear (no weakness).  No clear etiology of injury. Known recurrent L shoulder problem before similar bursitis, past injection effective No  prior imaging  Plan: Left shoulder subacromial steroid injection performed today, see procedure note for details.  1. Continue Tylenol and Tramadol 2. Relative rest but keep shoulder mobile, demonstrated ROM exercises, avoid heavy lifting 3. May try heating pad PRN 4. Follow-up 4-6 weeks if not improved for re-evaluation, consider referral to Physical Therapy, X-rays      Relevant Medications   lidocaine (PF) (XYLOCAINE) 1 % injection 4 mL (Completed)   methylPREDNISolone acetate (DEPO-MEDROL) injection 40 mg (Completed)   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    Improved DM control now with less hyperglycemia, A1c now 7.1 No hypoglycemia Complications - CKD-III, other including, GERD, obesity, hypothyroidism, Gout - increases risk of future cardiovascular complications  - OFF Sulfonylurea  Plan:  1. Continue current therapy - Janumet 50-500mg  BID 2. Encourage improved lifestyle - low carb, low sugar diet, reduce portion size, continue improving regular exercise 3. Check CBG, bring log to next visit for review 4. Continue ACEi, Statin 5. Follow-up 6 months      Relevant  Orders   POCT HgB A1C (Completed)      Meds ordered this encounter  Medications  . lidocaine (PF) (XYLOCAINE) 1 % injection 4 mL  . methylPREDNISolone acetate (DEPO-MEDROL) injection 40 mg    Follow up plan: Return in about 6 months (around 12/30/2018) for 6 months for lab review - DM, HLD, Shoulder pain.  Future labs 6 months 12/29/18  [name], [facility] 07/01/2018, 11:57 AM

## 2018-07-02 ENCOUNTER — Encounter: Payer: Self-pay | Admitting: Family Medicine

## 2018-07-02 ENCOUNTER — Other Ambulatory Visit: Payer: Self-pay | Admitting: Family Medicine

## 2018-07-02 DIAGNOSIS — E1121 Type 2 diabetes mellitus with diabetic nephropathy: Secondary | ICD-10-CM

## 2018-07-02 DIAGNOSIS — F5101 Primary insomnia: Secondary | ICD-10-CM

## 2018-07-02 DIAGNOSIS — N183 Chronic kidney disease, stage 3 unspecified: Secondary | ICD-10-CM

## 2018-07-02 DIAGNOSIS — E039 Hypothyroidism, unspecified: Secondary | ICD-10-CM

## 2018-07-02 DIAGNOSIS — I10 Essential (primary) hypertension: Secondary | ICD-10-CM

## 2018-07-02 NOTE — Assessment & Plan Note (Signed)
Consistent with subacute on chronic L-shoulder bursitis vs rotator cuff tendinopathy with some reduced active ROM but without significant evidence of muscle tear (no weakness).  No clear etiology of injury. Known recurrent L shoulder problem before similar bursitis, past injection effective No prior imaging  Plan: Left shoulder subacromial steroid injection performed today, see procedure note for details.  1. Continue Tylenol and Tramadol 2. Relative rest but keep shoulder mobile, demonstrated ROM exercises, avoid heavy lifting 3. May try heating pad PRN 4. Follow-up 4-6 weeks if not improved for re-evaluation, consider referral to Physical Therapy, X-rays

## 2018-07-02 NOTE — Assessment & Plan Note (Signed)
Improved DM control now with less hyperglycemia, A1c now 7.1 No hypoglycemia Complications - CKD-III, other including, GERD, obesity, hypothyroidism, Gout - increases risk of future cardiovascular complications  - OFF Sulfonylurea  Plan:  1. Continue current therapy - Janumet 50-500mg  BID 2. Encourage improved lifestyle - low carb, low sugar diet, reduce portion size, continue improving regular exercise 3. Check CBG, bring log to next visit for review 4. Continue ACEi, Statin 5. Follow-up 6 months

## 2018-07-06 ENCOUNTER — Other Ambulatory Visit: Payer: Medicare Other

## 2018-07-06 DIAGNOSIS — H40003 Preglaucoma, unspecified, bilateral: Secondary | ICD-10-CM | POA: Diagnosis not present

## 2018-07-07 ENCOUNTER — Ambulatory Visit (INDEPENDENT_AMBULATORY_CARE_PROVIDER_SITE_OTHER): Payer: Medicare Other

## 2018-07-07 ENCOUNTER — Encounter: Payer: Self-pay | Admitting: Internal Medicine

## 2018-07-07 ENCOUNTER — Other Ambulatory Visit: Payer: Self-pay

## 2018-07-07 ENCOUNTER — Ambulatory Visit (INDEPENDENT_AMBULATORY_CARE_PROVIDER_SITE_OTHER): Payer: Medicare Other | Admitting: Internal Medicine

## 2018-07-07 VITALS — BP 140/80 | HR 63 | Ht 59.0 in | Wt 170.2 lb

## 2018-07-07 DIAGNOSIS — Z950 Presence of cardiac pacemaker: Secondary | ICD-10-CM | POA: Diagnosis not present

## 2018-07-07 DIAGNOSIS — I4892 Unspecified atrial flutter: Secondary | ICD-10-CM | POA: Diagnosis not present

## 2018-07-07 DIAGNOSIS — I272 Pulmonary hypertension, unspecified: Secondary | ICD-10-CM | POA: Diagnosis not present

## 2018-07-07 DIAGNOSIS — R0602 Shortness of breath: Secondary | ICD-10-CM | POA: Diagnosis not present

## 2018-07-07 DIAGNOSIS — I4819 Other persistent atrial fibrillation: Secondary | ICD-10-CM

## 2018-07-07 DIAGNOSIS — I442 Atrioventricular block, complete: Secondary | ICD-10-CM | POA: Diagnosis not present

## 2018-07-07 NOTE — Patient Instructions (Signed)
Medication Instructions:  - Your physician recommends that you continue on your current medications as directed. Please refer to the Current Medication list given to you today.  If you need a refill on your cardiac medications before your next appointment, please call your pharmacy.   Lab work: - none ordered (we may need to order some additional lab work depending on the date of your Cardiac CT)  If you have labs (blood work) drawn today and your tests are completely normal, you will receive your results only by: Marland Kitchen MyChart Message (if you have MyChart) OR . A paper copy in the mail If you have any lab test that is abnormal or we need to change your treatment, we will call you to review the results.  Testing/Procedures: - Your physician has recommended that you have a Cardiac CT. You will be called by our [facility] office with a date/ time for your scan. It may take 3-4 weeks to be called for an appointment as this has to be run through your insurance prior to scheduling and there is a waiting list for these test to be done.    Please arrive at the [facility] main entrance of [facility] at ____________________ AM (30-45 minutes prior to test start time)  [facility] [address] [place], [place] [address] [phone]  Proceed to the [facility] Radiology Department (First Floor).  Please follow these instructions carefully (unless otherwise directed):  Hold all erectile dysfunction medications at least 48 hours prior to test.  On the Night Before the Test: . Be sure to Drink plenty of water. . Do not consume any caffeinated/decaffeinated beverages or chocolate 12 hours prior to your test. . Do not take any antihistamines 12 hours prior to your test. . If you take Metformin do not take 24 hours prior to test.  On the Day of the Test: . Drink plenty of water. Do not drink any water within one hour of the test. . Do not eat any food 4 hours prior to the  test. . You may take your regular medications prior to the test.  . Take metoprolol two hours prior to test. . HOLD Furosemide/Hydrochlorothiazide morning of the test.       After the Test: . Drink plenty of water. . After receiving IV contrast, you may experience a mild flushed feeling. This is normal. . On occasion, you may experience a mild rash up to 24 hours after the test. This is not dangerous. If this occurs, you can take Benadryl 25 mg and increase your fluid intake. . If you experience trouble breathing, this can be serious. If it is severe call 911 IMMEDIATELY. If it is mild, please call our office. . If you take any of these medications: Glipizide/Metformin, Avandament, Glucavance, please do not take 48 hours after completing test.   Follow-Up: At [facility], you and your health needs are our priority.  As part of our continuing mission to provide you with exceptional heart care, we have created designated Provider Care Teams.  These Care Teams include your primary Cardiologist (physician) and Advanced Practice Providers (APPs -  Physician Assistants and Nurse Practitioners) who all work together to provide you with the care you need, when you need it. . pending the results of your Cardiac CT scan  Any Other Special Instructions Will Be Listed Below (If Applicable). - N/A

## 2018-07-07 NOTE — Progress Notes (Signed)
Patient Care Team: Olin Hauser, DO as PCP - General (Family Medicine) Lavonia Dana, MD as Consulting Physician (Nephrology) Deboraha Sprang, MD as Consulting Physician (Cardiology) Edrick Kins, DPM as Consulting Physician (Podiatry)   HPI  Renee Boyd is a 67 y.o. female Seen in followup for largely asymptomatic permanent atrial fibrillation.  She transferred care to here 7/17.  At that time, anticoagulation was initiated.    She is s/p pacemaker insertion Medtronic 2014 for sinus node dysfunction and CHB   She has hx of HTN  DM and Grade 3 Renal insufficiency    Records and Results Reviewed Date Cr K Hgb TSH  10/17 1.36  8.8 0.304  2/18 1.73 4.9  0.6  6/18  1.3  11.1                DATE TEST EF   7/17 Echo   60-65 % Pulm HTN 64 (no E/E')  12/19  Echo           She struggled this winter with congestive heart failure manifested by volume overload and peripheral edema.  She ended up having to change doctors.  Dr. Raliegh Ip was able to get her diuresed.  Unfortunately, it was complicated by gout.  More recently she has been doing moderately well.  She has dyspnea on exertion but is able to carry groceries in from the car.  She denies nocturnal dyspnea or snoring but she has daytime somnolence and significant fatigue.  She did not undertake a sleep study because of concerns regarding hair oils.     As she was nearing the end of her Eliquis prescription, prior to its been refilled, she took it daily.  9/19 brought form from [facility] ascribing a nontraumatic intracerebral hemorrhage to her.  She has no idea what is talking about.  Review of the medical record demonstrates no imaging.  Wonder whether it is an error.  She was cardioverted 2 months ago; did not sustain sinus for more than days   She continues with some SOB DOE but much less than 6 or 8 months ago.  No peripheral edema.  Has a history of chest pain which she calls angina.  Had a Myoview  scan more than a decade ago.  Did not tolerate it.    Thromboembolic risk factors ( age -35, HTN-1, TIA/CVA-2, DM-1 , Gender-1) for a CHADSVASc Score of 6     Past Medical History:  Diagnosis Date  . Chronic kidney disease (CKD), stage III (moderate) (HCC)   . Complete heart block ([place])    a. s/p MDT PPM 2006 with device generator replacement 2014; b. followed by Dr. Caryl Comes  . Diabetes mellitus without complication ([place])   . GERD (gastroesophageal reflux disease)   . Hypertension   . Hypothyroid   . Pacemaker   . Persistent atrial fibrillation    a. on Eliquis; b. CHADS2VASc 6 (HTN. age x 1, DM, TIA x 2, female); c. s/p DCCV 05/28/18  . Pulmonary hypertension ([place])    a. echo 2017: EF of 60-65%, no RWMA, mildly dilated left atrium, RVSF normal, PASP 64 mmHg  . TIA (transient ischemic attack)     Past Surgical History:  Procedure Laterality Date  . ABDOMINAL HYSTERECTOMY    . CARDIOVERSION N/A 05/28/2018   Procedure: CARDIOVERSION;  Surgeon: Minna Merritts, MD;  Location: [facility] ORS;  Service: Cardiovascular;  Laterality: N/A;  . CHOLECYSTECTOMY    . TONSILLECTOMY    .  TUBAL LIGATION      Current Outpatient Medications  Medication Sig Dispense Refill  . allopurinol (ZYLOPRIM) 100 MG tablet Take 1 tablet (100 mg total) by mouth daily. 90 tablet 3  . amLODipine (NORVASC) 10 MG tablet Take 1 tablet (10 mg total) by mouth daily. 90 tablet 3  . apixaban (ELIQUIS) 5 MG TABS tablet Take 1 tablet (5 mg total) by mouth 2 (two) times daily. 180 tablet 2  . atorvastatin (LIPITOR) 20 MG tablet Take 1 tablet (20 mg total) by mouth daily. 90 tablet 0  . Baclofen 5 MG TABS Take 5 mg by mouth at bedtime. 90 tablet 3  . cholecalciferol (VITAMIN D3) 25 MCG (1000 UT) tablet Take 1,000 Units by mouth daily.    Marland Kitchen desonide (DESOWEN) 0.05 % lotion Apply 1 application topically 2 (two) times daily as needed. For up to 1 week max, caution if using on face. 118 mL 3  . furosemide (LASIX) 20 MG tablet  TAKE 2 TABLETS BY MOUTH EVERY MORNING (Patient taking differently: Take 40 mg by mouth daily. ) 180 tablet 3  . gabapentin (NEURONTIN) 300 MG capsule Take 1 capsule (300 mg total) by mouth 2 (two) times daily. 180 capsule 3  . glucose blood test strip Check blood sugar twice daily as instructed 100 each 11  . ibandronate (BONIVA) 150 MG tablet Take 1 tablet (150 mg total) by mouth every 30 (thirty) days. Take in the morning with a full glass of water, on an empty stomach 3 tablet 3  . levothyroxine (SYNTHROID, LEVOTHROID) 25 MCG tablet Take 1 tablet (25 mcg total) by mouth daily before breakfast. 90 tablet 1  . lisinopril (PRINIVIL,ZESTRIL) 20 MG tablet Take 1 tablet (20 mg total) by mouth daily. 90 tablet 3  . metoprolol succinate (TOPROL-XL) 100 MG 24 hr tablet Take 1 tablet (100 mg total) by mouth daily. Take with or immediately following a meal. 90 tablet 3  . omeprazole (PRILOSEC) 20 MG capsule TAKE 1 CAPSULE BY MOUTH TWICE A DAY BEFORE A MEAL (Patient taking differently: Take 20 mg by mouth 2 (two) times daily before a meal. ) 180 capsule 3  . sitaGLIPtin-metformin (JANUMET) 50-500 MG tablet Take 1 tablet by mouth 2 (two) times daily with a meal. 180 tablet 1  . temazepam (RESTORIL) 30 MG capsule TAKE 1 CAPSULE BY MOUTH AT BEDTIME AS NEEDED SLEEP (Patient taking differently: Take 30 mg by mouth at bedtime as needed for sleep. ) 30 capsule 2  . traMADol (ULTRAM) 50 MG tablet Take 1 tablet (50 mg total) by mouth 2 (two) times daily. 60 tablet 2   No current facility-administered medications for this visit.     Allergies  Allergen Reactions  . Morphine And Related Itching  . Percocet [Oxycodone-Acetaminophen] Other (See Comments)    Hallucinations       Review of Systems negative except from HPI and PMH  Physical Exam BP 140/80 (BP Location: Left Arm, Patient Position: Sitting, Cuff Size: Normal)   Pulse 63   Ht 4\' 11"  (1.499 m)   Wt 170 lb 4 oz (77.2 kg)   BMI 34.39 kg/m  Well  developed and nourished in no acute distress HENT normal Neck supple with JVP-6-7 Clear Regular rate and rhythm, no murmurs or gallops Abd-soft with active BS No Clubbing cyanosis edema Skin-warm and dry A & Oriented  Grossly normal sensory and motor function   ECG atrial flutter with ventricular pacing at 63 Intervals-/15/44  Assessment and  Plan  Atrial fibrillation/flutter long-term persistent  Ventricular tachycardia-nonsustained  Hypertension   Pacemaker  Medtronic    Hyperlipidemia  DM-nephropathy  Complete heart block  Sleep disordered breathing and daytime somnolence  She has recurrent atrial arrhythmias following cardioversion.  We will plan a trial of antiarrhythmic drug.  To stratify, we will need to look for coronary artery disease with her multiple cardiac risk factors.  She is averse to Myoview scanning, so will undertake CTA  VTNS is a concern but implications will be related to underlying heart disease, As above  BP reasonably controlled  On Anticoagulation;  No bleeding issues

## 2018-07-08 ENCOUNTER — Telehealth: Payer: Self-pay | Admitting: Internal Medicine

## 2018-07-08 NOTE — Telephone Encounter (Signed)
S/w patient and let her know that when they call to schedule to let them know what days may work best for her. Advised they will do what they can to schedule it when most convenient for her. She was appreciative.

## 2018-07-08 NOTE — Telephone Encounter (Signed)
Patient calling  Patient would like, if possible, to have CT Coronary scheduled during the holidays  Will have family in town that will be able to help Please advise

## 2018-07-13 ENCOUNTER — Other Ambulatory Visit: Payer: Self-pay | Admitting: Internal Medicine

## 2018-07-13 DIAGNOSIS — H40003 Preglaucoma, unspecified, bilateral: Secondary | ICD-10-CM | POA: Diagnosis not present

## 2018-07-21 ENCOUNTER — Ambulatory Visit (INDEPENDENT_AMBULATORY_CARE_PROVIDER_SITE_OTHER): Payer: Medicare Other

## 2018-07-21 DIAGNOSIS — I442 Atrioventricular block, complete: Secondary | ICD-10-CM | POA: Diagnosis not present

## 2018-07-21 NOTE — Progress Notes (Signed)
Remote pacemaker transmission.   

## 2018-07-22 ENCOUNTER — Encounter: Payer: Self-pay | Admitting: Cardiology

## 2018-08-06 ENCOUNTER — Telehealth: Payer: Self-pay | Admitting: Internal Medicine

## 2018-08-06 DIAGNOSIS — I4819 Other persistent atrial fibrillation: Secondary | ICD-10-CM

## 2018-08-06 NOTE — Telephone Encounter (Signed)
Demetrios Loll, RN        08-26-18 @ 8:30 for cardiac ct/ She is going to need a bmet before test. She is aware that someone will call to schedule/ Also had question about med to hold and take.

## 2018-08-06 NOTE — Telephone Encounter (Signed)
I called and spoke with the patient. She is scheduled to come in to the office on 08/12/18 for a BMP to be done at 10:00 am.  She did pull her written instructions that I had previously given her and we did go through these step by step for her Cardiac CTA.  All medications were reviewed in regards to her CT.  The patient verbalized understanding of the instructions. She was advised to call back with any further questions/ concerns.

## 2018-08-12 ENCOUNTER — Other Ambulatory Visit: Payer: Medicare Other

## 2018-08-12 ENCOUNTER — Other Ambulatory Visit
Admission: RE | Admit: 2018-08-12 | Discharge: 2018-08-12 | Disposition: A | Discharge status: Home / Self Care | Payer: Medicare Other | Attending: Internal Medicine | Admitting: Internal Medicine

## 2018-08-12 DIAGNOSIS — I4819 Other persistent atrial fibrillation: Secondary | ICD-10-CM | POA: Insufficient documentation

## 2018-08-12 LAB — BASIC METABOLIC PANEL
Anion gap: 9 (ref 5–15)
BUN: 23 mg/dL (ref 8–23)
CO2: 27 mmol/L (ref 22–32)
Calcium: 10.1 mg/dL (ref 8.9–10.3)
Chloride: 102 mmol/L (ref 98–111)
Creatinine, Ser: 1.2 mg/dL — ABNORMAL HIGH (ref 0.44–1.00)
GFR calc Af Amer: 53 mL/min — ABNORMAL LOW (ref >60)
GFR, EST NON AFRICAN AMERICAN: 46 mL/min — AB (ref >60)
Glucose, Bld: 158 mg/dL — ABNORMAL HIGH (ref 70–99)
Potassium: 4.3 mmol/L (ref 3.5–5.1)
Sodium: 138 mmol/L (ref 135–145)

## 2018-08-19 ENCOUNTER — Other Ambulatory Visit: Payer: Self-pay | Admitting: Family Medicine

## 2018-08-19 DIAGNOSIS — F5101 Primary insomnia: Secondary | ICD-10-CM

## 2018-08-23 LAB — CUP PACEART REMOTE DEVICE CHECK
Battery Remaining Longevity: 35 mo
Battery Voltage: 2.97 V
Brady Statistic AP VP Percent: 0.08 %
Brady Statistic AP VS Percent: 0 %
Brady Statistic AS VP Percent: 96.37 %
Brady Statistic AS VS Percent: 3.55 %
Brady Statistic RA Percent Paced: 0.08 %
Brady Statistic RV Percent Paced: 96.96 %
Date Time Interrogation Session: 20191217165426
Implantable Lead Implant Date: 20141217
Implantable Lead Implant Date: 20141217
Implantable Lead Location: 753859
Implantable Lead Location: 753860
Implantable Lead Model: 5076
Implantable Pulse Generator Implant Date: 20141217
Lead Channel Impedance Value: 399 Ohm
Lead Channel Impedance Value: 437 Ohm
Lead Channel Impedance Value: 589 Ohm
Lead Channel Pacing Threshold Amplitude: 0.375 V
Lead Channel Pacing Threshold Amplitude: 0.5 V
Lead Channel Pacing Threshold Pulse Width: 0.4 ms
Lead Channel Pacing Threshold Pulse Width: 0.4 ms
Lead Channel Sensing Intrinsic Amplitude: 11 mV
Lead Channel Sensing Intrinsic Amplitude: 4 mV
Lead Channel Sensing Intrinsic Amplitude: 4 mV
Lead Channel Setting Pacing Pulse Width: 0.4 ms
Lead Channel Setting Sensing Sensitivity: 0.9 mV
MDC IDC MSMT LEADCHNL RV IMPEDANCE VALUE: 551 Ohm
MDC IDC MSMT LEADCHNL RV SENSING INTR AMPL: 11 mV
MDC IDC SET LEADCHNL RA PACING AMPLITUDE: 2 V
MDC IDC SET LEADCHNL RV PACING AMPLITUDE: 2.5 V

## 2018-08-25 ENCOUNTER — Telehealth (HOSPITAL_COMMUNITY): Payer: Self-pay | Admitting: Emergency Medicine

## 2018-08-25 NOTE — Telephone Encounter (Signed)
Reaching out to patient to offer assistance regarding upcoming cardiac imaging study; pt verbalizes understanding of appt date/time, parking situation and where to check in, pre-test NPO status and medications ordered, and verified current allergies; name and call back number provided for further questions should they arise Aashika Carta RN Navigator Cardiac Imaging [facility] [phone] office [phone] cell 

## 2018-08-26 ENCOUNTER — Ambulatory Visit (HOSPITAL_COMMUNITY)
Admission: RE | Admit: 2018-08-26 | Discharge: 2018-08-26 | Disposition: A | Discharge status: Home / Self Care | Payer: Medicare Other | Source: Ambulatory Visit | Attending: Internal Medicine | Admitting: Internal Medicine

## 2018-08-26 ENCOUNTER — Encounter (HOSPITAL_COMMUNITY): Payer: Self-pay

## 2018-08-26 DIAGNOSIS — I4819 Other persistent atrial fibrillation: Secondary | ICD-10-CM

## 2018-08-26 MED ORDER — NITROGLYCERIN 0.4 MG SL SUBL
SUBLINGUAL_TABLET | SUBLINGUAL | Status: AC
  Administered 2018-08-26: 0.8 mg via SUBLINGUAL
  Filled 2018-08-26: qty 2

## 2018-08-26 MED ORDER — IOPAMIDOL (ISOVUE-370) INJECTION 76%
80.0000 mL | Freq: Once | INTRAVENOUS | Status: AC | PRN
  Administered 2018-08-26: 80 mL via INTRAVENOUS

## 2018-08-26 MED ORDER — NITROGLYCERIN 0.4 MG SL SUBL
0.8000 mg | SUBLINGUAL_TABLET | Freq: Once | SUBLINGUAL | Status: AC
  Administered 2018-08-26: 0.8 mg via SUBLINGUAL
  Filled 2018-08-26: qty 25

## 2018-08-26 MED ORDER — METOPROLOL TARTRATE 5 MG/5ML IV SOLN
INTRAVENOUS | Status: AC
  Filled 2018-08-26: qty 5

## 2018-09-03 ENCOUNTER — Telehealth: Payer: Self-pay | Admitting: Internal Medicine

## 2018-09-03 NOTE — Telephone Encounter (Signed)
I spoke with the patient. She is aware of her Cardiac CT report and that I am waiting on Dr. Caryl Comes to decide what he would like to do with her medication. She is aware that I will call her back after further review with Dr. Caryl Comes.  The patient voices understanding and is agreeable.

## 2018-09-03 NOTE — Telephone Encounter (Signed)
Patient returning call Patient not sure who has called

## 2018-09-03 NOTE — Telephone Encounter (Signed)
Notes recorded by Emily Filbert, RN on 09/03/2018 at 1:48 PM EST Dr. Caryl Comes made aware that the FFR report was back. Per Dr. 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  I attempted to call the patient with results- no answer/ no voice mail.  I am also awaiting MD recommendations if the patient will need to be placed on an antiarrhythmic for her atrial fibrillation. ------  Notes recorded by Deboraha Sprang, MD on 09/03/2018 at 1:33 PM EST H Please tell her the initial review suggests that there is some blockage in her heart. There is further computer evaluation that will help [place] understand how severe that blockages. We will let her know when that comes back. Thanks

## 2018-09-04 ENCOUNTER — Other Ambulatory Visit: Payer: Self-pay | Admitting: Family Medicine

## 2018-09-04 DIAGNOSIS — M25552 Pain in left hip: Principal | ICD-10-CM

## 2018-09-04 DIAGNOSIS — G8929 Other chronic pain: Secondary | ICD-10-CM

## 2018-09-09 NOTE — Telephone Encounter (Signed)
I called and spoke with the patient about Dr. Olin Pia recommendations regarding her anti arrhythmic therapy. She states that Dr. Lavera Guise had her on amiodarone for about 10 years (12/06-6/17). She was on this when she was hospitalized in 2017, but she could not recall why this was stopped.  She states that her INR was being checked by her PCP at the time, but they had a hard time getting her blood from her finger prick and this kept "getting passed off" per the patient. In reviewing her records, she was admitted 02/07/16 with weakness- found to be anemic with suprtherapeutic INR. She ended up with a blood transfusion. Labs on admission- 02/07/16: INR >10.0, Hemoglabin- 6.8.  Amiodarone was stopped at discharge 02/11/16.  At that time, she was discharged- warfarin and amiodarone were stopped.   The patient states she currently feels like she goes "back and forth" between feeling well and not feeling so good.  I have advised the patient that I think we need to schedule her an office visit to see Dr. Caryl Comes to discuss antiarrhythmic therapy further and see if a DCCV will be needed.  The patient is agreeable- I have scheduled her to see Dr. Caryl Comes tomorrow at 10:00 am.

## 2018-09-09 NOTE — Telephone Encounter (Signed)
Deboraha Sprang, MD  Emily Filbert, RN; Dollene Primrose, RN        Please Inform Patient that CTA is abnormal And thus her options AAD include amio v dofetilide   Thanks

## 2018-09-10 ENCOUNTER — Other Ambulatory Visit
Admission: RE | Admit: 2018-09-10 | Discharge: 2018-09-10 | Disposition: A | Discharge status: Home / Self Care | Payer: Medicare Other | Source: Ambulatory Visit | Attending: Internal Medicine | Admitting: Internal Medicine

## 2018-09-10 ENCOUNTER — Ambulatory Visit (INDEPENDENT_AMBULATORY_CARE_PROVIDER_SITE_OTHER): Payer: Medicare Other | Admitting: Internal Medicine

## 2018-09-10 ENCOUNTER — Encounter: Payer: Self-pay | Admitting: Internal Medicine

## 2018-09-10 VITALS — BP 116/62 | HR 86 | Ht 59.0 in | Wt 173.5 lb

## 2018-09-10 DIAGNOSIS — Z950 Presence of cardiac pacemaker: Secondary | ICD-10-CM | POA: Diagnosis not present

## 2018-09-10 DIAGNOSIS — Z79899 Other long term (current) drug therapy: Secondary | ICD-10-CM

## 2018-09-10 DIAGNOSIS — I4819 Other persistent atrial fibrillation: Secondary | ICD-10-CM

## 2018-09-10 DIAGNOSIS — I10 Essential (primary) hypertension: Secondary | ICD-10-CM | POA: Diagnosis not present

## 2018-09-10 DIAGNOSIS — I442 Atrioventricular block, complete: Secondary | ICD-10-CM

## 2018-09-10 LAB — HEPATIC FUNCTION PANEL
ALT: 18 U/L (ref 0–44)
AST: 18 U/L (ref 15–41)
Albumin: 4.7 g/dL (ref 3.5–5.0)
Alkaline Phosphatase: 49 U/L (ref 38–126)
Bilirubin, Direct: <0.1 mg/dL (ref 0.0–0.2)
Total Bilirubin: 0.8 mg/dL (ref 0.3–1.2)
Total Protein: 7.3 g/dL (ref 6.5–8.1)

## 2018-09-10 LAB — TSH: TSH: 1.298 u[IU]/mL (ref 0.350–4.500)

## 2018-09-10 MED ORDER — AMIODARONE HCL 400 MG PO TABS: ORAL_TABLET | ORAL | 0 refills | Status: DC

## 2018-09-10 NOTE — Patient Instructions (Signed)
Medication Instructions:  - Your physician has recommended you make the following change in your medication:   1) RE-START amiodarone:  - 400 mg- take 1 tablet by mouth twice daily x 2 weeks, then - 400 mg- take 1 tablet by mouth once daily x 2 weeks, then (already sent to the pharmacy)  - 200 mg- take 1 tablet by mouth once daily  (Dr. Olin Pia nurse will send this to the pharmacy in about 2 weeks)  If you need a refill on your cardiac medications before your next appointment, please call your pharmacy.   Lab work: - Your physician recommends that you have lab work today : TSH/ Liver  If you have labs (blood work) drawn today and your tests are completely normal, you will receive your results only by: Marland Kitchen MyChart Message (if you have MyChart) OR . A paper copy in the mail If you have any lab test that is abnormal or we need to change your treatment, we will call you to review the results.  Testing/Procedures: - none ordered  Follow-Up: At [facility], you and your health needs are our priority.  As part of our continuing mission to provide you with exceptional heart care, we have created designated Provider Care Teams.  These Care Teams include your primary Cardiologist (physician) and Advanced Practice Providers (APPs -  Physician Assistants and Nurse Practitioners) who all work together to provide you with the care you need, when you need it. . in 6 weeks with Dr. Caryl Comes  Any Other Special Instructions Will Be Listed Below (If Applicable). - N/A

## 2018-09-10 NOTE — Progress Notes (Signed)
Patient Care Team: Olin Hauser, DO as PCP - General (Family Medicine) Lavonia Dana, MD as Consulting Physician (Nephrology) Deboraha Sprang, MD as Consulting Physician (Cardiology) Edrick Kins, DPM as Consulting Physician (Podiatry)   HPI  Renee Boyd is a 68 y.o. female Seen in followup for largely asymptomatic permanent atrial fibrillation.  She transferred care to here 7/17.  At that time, anticoagulation was initiated.    She is s/p pacemaker insertion Medtronic 2014 for sinus node dysfunction and CHB   Previously exposed to amiodarone when started [employer].  Ended up with elevated INR and the amiodarone was discontinued.  She had tolerated it well.  Records and Results Reviewed Date Cr K Hgb LFTs TSH  10/17 1.36  8.8  0.304  2/18 1.73 4.9   0.6  6/18  1.3  11.1    1/20 1.2 4.3 11.3            DATE TEST EF   7/17 Echo   60-65 % Pulm HTN 64 (no E/E')  12/19  Echo  50-55% LVH severe (17/16)   PAsys 50-55   MR mild LAE(37/2.0/34)  1/20 CTA  CAD 2V   FFR > 0.8 LAD/RCA         9/19 brought form from [facility] ascribing a nontraumatic intracerebral hemorrhage to her.  She has no idea what is talking about.  Review of the medical record demonstrates no imaging.  Wonder whether it is an error.  She was cardioverted 2 months ago; did not sustain sinus for more than days   She has had problems with variable functional capacity with dyspnea on exertion.  Some peripheral edema but no chest pain  Underwent CTA imaging looking for coronary disease to consider antiarrhythmic therapy   Thromboembolic risk factors ( age -57, HTN-1, TIA/CVA-2, DM-1 , Gender-1) for a CHADSVASc Score of 6     Past Medical History:  Diagnosis Date  . Chronic kidney disease (CKD), stage III (moderate) (HCC)   . Complete heart block ([place])    a. s/p MDT PPM 2006 with device generator replacement 2014; b. followed by Dr. Caryl Comes  . Diabetes mellitus without  complication ([place])   . GERD (gastroesophageal reflux disease)   . Hypertension   . Hypothyroid   . Pacemaker   . Persistent atrial fibrillation    a. on Eliquis; b. CHADS2VASc 6 (HTN. age x 1, DM, TIA x 2, female); c. s/p DCCV 05/28/18  . Pulmonary hypertension ([place])    a. echo 2017: EF of 60-65%, no RWMA, mildly dilated left atrium, RVSF normal, PASP 64 mmHg  . TIA (transient ischemic attack)     Past Surgical History:  Procedure Laterality Date  . ABDOMINAL HYSTERECTOMY    . CARDIOVERSION N/A 05/28/2018   Procedure: CARDIOVERSION;  Surgeon: Minna Merritts, MD;  Location: [facility] ORS;  Service: Cardiovascular;  Laterality: N/A;  . CHOLECYSTECTOMY    . TONSILLECTOMY    . TUBAL LIGATION      Current Outpatient Medications  Medication Sig Dispense Refill  . allopurinol (ZYLOPRIM) 100 MG tablet Take 1 tablet (100 mg total) by mouth daily. 90 tablet 3  . amLODipine (NORVASC) 10 MG tablet Take 1 tablet (10 mg total) by mouth daily. 90 tablet 3  . apixaban (ELIQUIS) 5 MG TABS tablet Take 1 tablet (5 mg total) by mouth 2 (two) times daily. 180 tablet 2  . atorvastatin (LIPITOR) 20 MG tablet TAKE 1 TABLET DAILY  90 tablet 2  . Baclofen 5 MG TABS Take 5 mg by mouth at bedtime. 90 tablet 3  . cholecalciferol (VITAMIN D3) 25 MCG (1000 UT) tablet Take 1,000 Units by mouth. 3 x weekly    . desonide (DESOWEN) 0.05 % lotion Apply 1 application topically 2 (two) times daily as needed. For up to 1 week max, caution if using on face. 118 mL 3  . furosemide (LASIX) 20 MG tablet TAKE 2 TABLETS BY MOUTH EVERY MORNING (Patient taking differently: Take 40 mg by mouth daily. ) 180 tablet 3  . gabapentin (NEURONTIN) 300 MG capsule Take 1 capsule (300 mg total) by mouth 2 (two) times daily. 180 capsule 3  . glucose blood test strip Check blood sugar twice daily as instructed 100 each 11  . ibandronate (BONIVA) 150 MG tablet Take 1 tablet (150 mg total) by mouth every 30 (thirty) days. Take in the morning with  a full glass of water, on an empty stomach 3 tablet 3  . levothyroxine (SYNTHROID, LEVOTHROID) 25 MCG tablet Take 1 tablet (25 mcg total) by mouth daily before breakfast. 90 tablet 1  . lisinopril (PRINIVIL,ZESTRIL) 20 MG tablet Take 1 tablet (20 mg total) by mouth daily. 90 tablet 3  . metoprolol succinate (TOPROL-XL) 100 MG 24 hr tablet Take 1 tablet (100 mg total) by mouth daily. Take with or immediately following a meal. 90 tablet 3  . omeprazole (PRILOSEC) 20 MG capsule TAKE 1 CAPSULE BY MOUTH TWICE A DAY BEFORE A MEAL (Patient taking differently: Take 20 mg by mouth 2 (two) times daily before a meal. ) 180 capsule 3  . sitaGLIPtin-metformin (JANUMET) 50-500 MG tablet Take 1 tablet by mouth 2 (two) times daily with a meal. 180 tablet 1  . temazepam (RESTORIL) 30 MG capsule Take 1 capsule (30 mg total) by mouth at bedtime as needed for sleep. 30 capsule 2  . traMADol (ULTRAM) 50 MG tablet TAKE 1 TABLET BY MOUTH TWICE DAILY 60 tablet 2   No current facility-administered medications for this visit.     Allergies  Allergen Reactions  . Morphine And Related Itching  . Percocet [Oxycodone-Acetaminophen] Other (See Comments)    Hallucinations       Review of Systems negative except from HPI and PMH  Physical Exam BP 116/62 (BP Location: Left Arm, Patient Position: Sitting, Cuff Size: Normal)   Pulse 86   Ht 4\' 11"  (1.499 m)   Wt 173 lb 8 oz (78.7 kg)   BMI 35.04 kg/m  Well developed and nourished in no acute distress HENT normal Neck supple with JVP-flat Clear Regular rate and rhythm, no murmurs or gallops Abd-soft with active BS No Clubbing cyanosis 1=+ edema Skin-warm and dry A & Oriented  Grossly normal sensory and motor function   ECG AV pacing with frequent ventricular ectopy with a right bundle inferior axis morphology Assessment and  Plan  Atrial fibrillation/flutter long-term persistent  Ventricular tachycardia-nonsustained  Hypertension   Pacemaker  Medtronic     DM-nephropathy  Complete heart block  PVCs  Sleep disordered breathing and daytime somnolence  Recurrent persistent atrial with functional limitations likely associated with atrial fibrillation.  We have discussed antiarrhythmic therapy and with her coronary disease excluding 1C therapy, discussed dofetilide and amiodarone.  Not withstanding her young age, she would like to resume amiodarone, a drug with which she was familiar until a few years ago when it was stopped with a supratherapeutic INR.  I think it is reasonable.  I  think it is also to be anticipated that we would use it as bridge to catheter ablation  We will begin amiodarone.  We will check surveillance laboratories today and then have her come back in about 6 weeks.  As she has had long episodes of atrial fibrillation that have been self terminating, hopefully she will maintain sinus rhythm and if not she will again next time she reverts to sinus rhythm.  At this point we will not anticipate cardioversion  PVC burden is extremely high; antiarrhythmic therapy may also suppress.  Thankfully there is no interval effect on LV function  Device was interrogated today demonstrating significant atrial fibrillation burden  We spent more than 50% of our >25 min visit in face to face counseling regarding the above

## 2018-09-14 ENCOUNTER — Telehealth: Payer: Self-pay | Admitting: Family Medicine

## 2018-09-14 NOTE — Telephone Encounter (Signed)
Pt needs a refill on [facility]

## 2018-09-15 ENCOUNTER — Other Ambulatory Visit: Payer: Self-pay

## 2018-09-15 NOTE — Telephone Encounter (Signed)
As per patient she called pharmacy yesterday but they didn't have her meds.

## 2018-09-15 NOTE — Telephone Encounter (Addendum)
Called pharmacy they shipped on 01/26 and delivered on 01/28. The patient got confused by box color but did commit that she received it.

## 2018-09-18 ENCOUNTER — Telehealth: Payer: Self-pay | Admitting: Internal Medicine

## 2018-09-18 NOTE — Telephone Encounter (Signed)
Spoke with patient and she is just not able to tolerate the amiodarone at the high dose she is taking at this time. She reports that she has been taking Amiodarone 200 mg 2 tablets twice daily. She reports vomiting and just reports she just can't continue tolerating. Instructed her to decrease to Amiodarone 200 mg 2 tablets once daily or 1 tablet every 12 hours and that I would check with Dr. Olin Pia nurse on Monday morning about this. She states that this medication did make her sick previously when she was on it. She verbalized understanding, agreement with plan, and had no further questions at this time.

## 2018-09-18 NOTE — Telephone Encounter (Signed)
Pt c/o medication issue:  1. Name of Medication: amiodarone   2. How are you currently taking this medication (dosage and times per day)? 400 MG 2 times daily   3. Are you having a reaction (difficulty breathing--STAT)? Throwing up, heaving, nauseated, can't eat, food doesn't taste right  4. What is your medication issue? Patient started taking new medication last Saturday.  Does say that arm is no longer hurting but doesn't feel she can take medication any longer.  Please call to discuss.

## 2018-09-18 NOTE — Telephone Encounter (Signed)
Spoke with [facility] to confirm dosage of pills provided to patient. She reports that amiodarone does not come in 400 mg and so they provided her with 200 mg and instructions reviewed.

## 2018-09-21 NOTE — Telephone Encounter (Signed)
I spoke with the patient to follow up on her amiodarone dose and symtpoms. Per the patient, she is currently taking amiodarone 200 mg BID and feels she is doing ok with this. She was seen by Dr. Caryl Comes on 2/7 and re-loaded on amiodarone 400 mg BID x 2 weeks, then 400 mg daily x 2 weeks, with plans to then decrease to amiodarone 200 mg daily.  I advised the patient to continue with amiodarone 200 BID at this time. She is aware I will clarify with Dr. Caryl Comes, in light of her symptoms last week, if she will need to stay on this dose until ~ 10/02/18.  The patient voices understanding and is agreeable.

## 2018-09-22 LAB — CUP PACEART INCLINIC DEVICE CHECK
Battery Remaining Longevity: 36 mo
Battery Voltage: 2.97 V
Brady Statistic AP VP Percent: 10.39 %
Brady Statistic AP VS Percent: 0.02 %
Brady Statistic AS VP Percent: 86.39 %
Brady Statistic AS VS Percent: 3.2 %
Brady Statistic RA Percent Paced: 9.76 %
Brady Statistic RV Percent Paced: 96.28 %
Date Time Interrogation Session: 20191203153631
Implantable Lead Implant Date: 20141217
Implantable Lead Location: 753859
Implantable Lead Location: 753860
Implantable Lead Model: 5076
Implantable Lead Model: 5076
Implantable Pulse Generator Implant Date: 20141217
Lead Channel Impedance Value: 418 Ohm
Lead Channel Impedance Value: 418 Ohm
Lead Channel Impedance Value: 589 Ohm
Lead Channel Impedance Value: 589 Ohm
Lead Channel Pacing Threshold Amplitude: 0.75 V
Lead Channel Pacing Threshold Pulse Width: 0.4 ms
Lead Channel Sensing Intrinsic Amplitude: 4.75 mV
Lead Channel Setting Pacing Amplitude: 2.5 V
Lead Channel Setting Pacing Pulse Width: 0.4 ms
MDC IDC LEAD IMPLANT DT: 20141217
MDC IDC SET LEADCHNL RA PACING AMPLITUDE: 2 V
MDC IDC SET LEADCHNL RV SENSING SENSITIVITY: 0.9 mV

## 2018-09-22 MED ORDER — AMIODARONE HCL 200 MG PO TABS: ORAL_TABLET | ORAL | Status: DC

## 2018-09-22 NOTE — Telephone Encounter (Signed)
I spoke with the patient. She is aware of Dr. Olin Pia recommendations to take:  1) amiodarone 200 mg BID x 2 weeks (through 2/28), then 2) decrease amiodarone to 100 mg BID  The patient is agreeable with the above recommendations.  She will keep her follow up on 3/19 with Dr. Caryl Comes.

## 2018-09-22 NOTE — Telephone Encounter (Signed)
Agree with 200 bid x 2 weeks then try 100 bid

## 2018-09-23 ENCOUNTER — Encounter: Payer: Self-pay | Admitting: Internal Medicine

## 2018-09-23 DIAGNOSIS — R8290 Unspecified abnormal findings in urine: Secondary | ICD-10-CM | POA: Diagnosis not present

## 2018-09-23 DIAGNOSIS — E875 Hyperkalemia: Secondary | ICD-10-CM | POA: Diagnosis not present

## 2018-09-23 DIAGNOSIS — N184 Chronic kidney disease, stage 4 (severe): Secondary | ICD-10-CM | POA: Diagnosis not present

## 2018-09-23 DIAGNOSIS — E1122 Type 2 diabetes mellitus with diabetic chronic kidney disease: Secondary | ICD-10-CM | POA: Diagnosis not present

## 2018-09-23 DIAGNOSIS — I129 Hypertensive chronic kidney disease with stage 1 through stage 4 chronic kidney disease, or unspecified chronic kidney disease: Secondary | ICD-10-CM | POA: Diagnosis not present

## 2018-09-30 DIAGNOSIS — D631 Anemia in chronic kidney disease: Secondary | ICD-10-CM | POA: Diagnosis not present

## 2018-09-30 DIAGNOSIS — R809 Proteinuria, unspecified: Secondary | ICD-10-CM | POA: Diagnosis not present

## 2018-09-30 DIAGNOSIS — N183 Chronic kidney disease, stage 3 (moderate): Secondary | ICD-10-CM | POA: Diagnosis not present

## 2018-09-30 DIAGNOSIS — E875 Hyperkalemia: Secondary | ICD-10-CM | POA: Diagnosis not present

## 2018-09-30 DIAGNOSIS — I129 Hypertensive chronic kidney disease with stage 1 through stage 4 chronic kidney disease, or unspecified chronic kidney disease: Secondary | ICD-10-CM | POA: Diagnosis not present

## 2018-09-30 DIAGNOSIS — E1122 Type 2 diabetes mellitus with diabetic chronic kidney disease: Secondary | ICD-10-CM | POA: Diagnosis not present

## 2018-09-30 DIAGNOSIS — N2581 Secondary hyperparathyroidism of renal origin: Secondary | ICD-10-CM | POA: Diagnosis not present

## 2018-10-20 ENCOUNTER — Other Ambulatory Visit: Payer: Self-pay

## 2018-10-20 ENCOUNTER — Telehealth: Payer: Self-pay | Admitting: Internal Medicine

## 2018-10-20 ENCOUNTER — Ambulatory Visit (INDEPENDENT_AMBULATORY_CARE_PROVIDER_SITE_OTHER): Payer: Medicare Other | Admitting: *Deleted

## 2018-10-20 DIAGNOSIS — I442 Atrioventricular block, complete: Secondary | ICD-10-CM | POA: Diagnosis not present

## 2018-10-20 NOTE — Telephone Encounter (Signed)
Called and spoke to pt regarding appointment 10/22/18 for atrial fib and amiodarone    Currently symptoms are better and with constipation and we will see patient as scheduled

## 2018-10-21 ENCOUNTER — Telehealth: Payer: Self-pay | Admitting: Internal Medicine

## 2018-10-21 LAB — CUP PACEART REMOTE DEVICE CHECK
Battery Remaining Longevity: 39 mo
Battery Voltage: 2.97 V
Brady Statistic AP VP Percent: 99.28 %
Brady Statistic AP VS Percent: 0.17 %
Brady Statistic AS VP Percent: 0.53 %
Brady Statistic AS VS Percent: 0.02 %
Brady Statistic RV Percent Paced: 97.94 %
Date Time Interrogation Session: 20200317143713
Implantable Lead Implant Date: 20141217
Implantable Lead Location: 753859
Implantable Lead Location: 753860
Implantable Lead Model: 5076
Implantable Lead Model: 5076
Implantable Pulse Generator Implant Date: 20141217
Lead Channel Impedance Value: 399 Ohm
Lead Channel Impedance Value: 418 Ohm
Lead Channel Impedance Value: 494 Ohm
Lead Channel Impedance Value: 513 Ohm
Lead Channel Pacing Threshold Amplitude: 0.375 V
Lead Channel Pacing Threshold Amplitude: 0.625 V
Lead Channel Pacing Threshold Pulse Width: 0.4 ms
Lead Channel Pacing Threshold Pulse Width: 0.4 ms
Lead Channel Sensing Intrinsic Amplitude: 3.375 mV
Lead Channel Sensing Intrinsic Amplitude: 3.375 mV
Lead Channel Sensing Intrinsic Amplitude: 8.75 mV
Lead Channel Sensing Intrinsic Amplitude: 8.75 mV
Lead Channel Setting Pacing Amplitude: 2 V
Lead Channel Setting Pacing Amplitude: 2.5 V
Lead Channel Setting Pacing Pulse Width: 0.4 ms
Lead Channel Setting Sensing Sensitivity: 0.9 mV
MDC IDC LEAD IMPLANT DT: 20141217
MDC IDC STAT BRADY RA PERCENT PACED: 99.14 %

## 2018-10-21 NOTE — Progress Notes (Signed)
Patient Care Team: Olin Hauser, DO as PCP - General (Family Medicine) Lavonia Dana, MD as Consulting Physician (Nephrology) Deboraha Sprang, MD as Consulting Physician (Cardiology) Edrick Kins, DPM as Consulting Physician (Podiatry)   HPI  Renee Boyd is a 68 y.o. female Seen in followup for largely asymptomatic permanent atrial fibrillation.  She is s/p pacemaker insertion Medtronic 2014 for sinus node dysfunction and CHB   She transferred care to here 7/17.  At that time, anticoagulation was initiated.    Patient presents today for a follow-up of Atrial fib, amiodaronecompliance and for possible re-programming of her PPM. She currently taking half amiodarone in the morning and half at night. She states that she lost 9 pounds, but has not made any diet changes (she eats at fast food restaurants frequently). She has started eating more vegetables.   Currently symptoms arebetter, but she complains of constipation.  She admits right shoulder pain and occasional nausea secondary to medication but this is improved of late taking her medication twice daily   the patient denies chest pain, shortness of breath, or peripheral edema.  There have been no palpitations no interval atrial fibrillation of which she is aware  Patient denies symptoms of GI intolerance, sun sensitivity, neurological symptoms attributable to amiodarone.       Records and Results Reviewed Date Cr K Hgb LFTs TSH  10/17 1.36  8.8  0.304  2/18 1.73 4.9   0.6  6/18  1.3  11.1    1/20 1.2 4.3 11.3 18(2/20) 1.298 (2/20)          DATE TEST EF   7/17 Echo   60-65 % Pulm HTN 64 (no E/E')  12/19  Echo  50-55% LVH severe (17/16)   PAsys 50-55   MR mild LAE(37/2.0/34)  1/20 CTA  CAD 2V   FFR > 0.8 LAD/RCA             Thromboembolic risk factors ( age -14, HTN-1, TIA/CVA-2, DM-1 , Gender-1) for a CHADSVASc Score of 6}     Past Medical History:  Diagnosis Date   Chronic kidney  disease (CKD), stage III (moderate) (HCC)    Complete heart block ([place])    a. s/p MDT PPM 2006 with device generator replacement 2014; b. followed by Dr. Caryl Comes   Diabetes mellitus without complication ([place])    GERD (gastroesophageal reflux disease)    Hypertension    Hypothyroid    Pacemaker    Persistent atrial fibrillation    a. on Eliquis; b. CHADS2VASc 6 (HTN. age x 1, DM, TIA x 2, female); c. s/p DCCV 05/28/18   Pulmonary hypertension ([place])    a. echo 2017: EF of 60-65%, no RWMA, mildly dilated left atrium, RVSF normal, PASP 64 mmHg   TIA (transient ischemic attack)     Past Surgical History:  Procedure Laterality Date   ABDOMINAL HYSTERECTOMY     CARDIOVERSION N/A 05/28/2018   Procedure: CARDIOVERSION;  Surgeon: Minna Merritts, MD;  Location: [facility] ORS;  Service: Cardiovascular;  Laterality: N/A;   CHOLECYSTECTOMY     TONSILLECTOMY     TUBAL LIGATION      Current Outpatient Medications  Medication Sig Dispense Refill   allopurinol (ZYLOPRIM) 100 MG tablet Take 1 tablet (100 mg total) by mouth daily. 90 tablet 3   amiodarone (PACERONE) 200 MG tablet Take 1 tablet (200 mg) by mouth twice daily, then take 0.5 tablet (100 mg) by mouth twice daily (Patient  taking differently: Take 200 mg by mouth daily. )     amLODipine (NORVASC) 10 MG tablet Take 1 tablet (10 mg total) by mouth daily. 90 tablet 3   apixaban (ELIQUIS) 5 MG TABS tablet Take 1 tablet (5 mg total) by mouth 2 (two) times daily. 180 tablet 2   atorvastatin (LIPITOR) 20 MG tablet TAKE 1 TABLET DAILY 90 tablet 2   Baclofen 5 MG TABS Take 5 mg by mouth at bedtime. 90 tablet 3   cholecalciferol (VITAMIN D3) 25 MCG (1000 UT) tablet Take 1,000 Units by mouth. 3 x weekly     desonide (DESOWEN) 0.05 % lotion Apply 1 application topically 2 (two) times daily as needed. For up to 1 week max, caution if using on face. 118 mL 3   furosemide (LASIX) 20 MG tablet TAKE 2 TABLETS BY MOUTH EVERY MORNING  (Patient taking differently: Take 40 mg by mouth daily. ) 180 tablet 3   glucose blood test strip Check blood sugar twice daily as instructed 100 each 11   ibandronate (BONIVA) 150 MG tablet Take 1 tablet (150 mg total) by mouth every 30 (thirty) days. Take in the morning with a full glass of water, on an empty stomach 3 tablet 3   levothyroxine (SYNTHROID, LEVOTHROID) 25 MCG tablet Take 1 tablet (25 mcg total) by mouth daily before breakfast. 90 tablet 1   lisinopril (PRINIVIL,ZESTRIL) 20 MG tablet Take 1 tablet (20 mg total) by mouth daily. 90 tablet 3   metoprolol succinate (TOPROL-XL) 100 MG 24 hr tablet Take 1 tablet (100 mg total) by mouth daily. Take with or immediately following a meal. 90 tablet 3   omeprazole (PRILOSEC) 20 MG capsule TAKE 1 CAPSULE BY MOUTH TWICE A DAY BEFORE A MEAL (Patient taking differently: Take 20 mg by mouth 2 (two) times daily before a meal. ) 180 capsule 3   sitaGLIPtin-metformin (JANUMET) 50-500 MG tablet Take 1 tablet by mouth 2 (two) times daily with a meal. 180 tablet 1   temazepam (RESTORIL) 30 MG capsule Take 1 capsule (30 mg total) by mouth at bedtime as needed for sleep. 30 capsule 2   traMADol (ULTRAM) 50 MG tablet TAKE 1 TABLET BY MOUTH TWICE DAILY 60 tablet 2   gabapentin (NEURONTIN) 300 MG capsule Take 1 capsule (300 mg total) by mouth 2 (two) times daily. (Patient not taking: Reported on 10/22/2018) 180 capsule 3   No current facility-administered medications for this visit.     Allergies  Allergen Reactions   Morphine And Related Itching   Percocet [Oxycodone-Acetaminophen] Other (See Comments)    Hallucinations       Review of Systems Please see the history of present illness. All other systems reviewed and negative.   Physical Exam  BP 128/70 (BP Location: Left Arm, Patient Position: Sitting, Cuff Size: Normal)    Pulse 69    Ht 4\' 11"  (1.499 m)    Wt 165 lb 8 oz (75.1 kg)    BMI 33.43 kg/m  Well developed and well nourished  in no acute distress HENT normal Neck supple with JVP-flat Clear Device pocket well healed; without hematoma or erythema.  There is no tethering  Regular rate and rhythm, no  gallop No  murmur Abd-soft with active BS No Clubbing cyanosis  edema Skin-warm and dry A & Oriented  Grossly normal sensory and motor function  ECG sinus   Assessment and  Plan  Atrial fibrillation/flutter long-term persistent  Ventricular tachycardia-nonsustained  Hypertension   Pacemaker  Medtronic The patient's device was interrogated.  The information was reviewed. No changes were made in the programming.     DM-nephropathy  Complete heart block  PVCs  Sleep disordered breathing and daytime somnolence  High Risk Medication Surveillance    Holding sinus  Tolerating amiodarone  On Anticoagulation;  No bleeding issues   Check amio labs   I, Heide Scales am acting as a sibecribe for Virl Axe, M.D. I have reviewed the above documentation for accuracy and completeness, and I agree with the above.     Signed, Virl Axe, MD 10/22/18 [place], [place]

## 2018-10-21 NOTE — Telephone Encounter (Signed)
   Cardiac Questionnaire:    Since your last visit or hospitalization:    1. Have you been having new or worsening chest pain? No    2. Have you been having new or worsening shortness of breath? No 3. Have you been having new or worsening leg swelling, wt gain, or increase in abdominal girth (pants fitting more tightly)? No   4. Have you had any passing out spells? No  Dr. Caryl Comes spoke with the patient yesterday and based on her current cardiac condition he would still like to see her for her scheduled office visit on 10/22/18.    _____________   [id number] Pre-Screening Questions:  . Have you recently travelled abroad or to [place], [place] state, or [place]? No (3) . Do you currently have a fever? No (3) . Have you been in contact with someone that is currently pending confirmation of Covid19 testing or has been confirmed to have the [place] virus?  No (3) . Are you currently experiencing fatigue or cough? No (2) . Are you currently experiencing shortness of breath at rest or with minimal activity? No (1) . Have you been in contact with someone that was recently sick with fever/cough/fatigue? No (1)   **A score of 3 or more should result in cancellation of the pts cardiology appt.  **A score of  2 should be provided a mask prior to admission into the lobby  *Travel to a high risk area or contact with a confirmed case should stay at home, away from confirmed  patient, monitor symptoms, and reach out to PCP for e-visit, additional testing.  *ALL PTS W/ FEVER SHOULD BE REFERRED TO PCP FOR E-VISIT*

## 2018-10-22 ENCOUNTER — Other Ambulatory Visit: Payer: Self-pay

## 2018-10-22 ENCOUNTER — Encounter: Payer: Self-pay | Admitting: Internal Medicine

## 2018-10-22 ENCOUNTER — Other Ambulatory Visit
Admission: RE | Admit: 2018-10-22 | Discharge: 2018-10-22 | Disposition: A | Discharge status: Home / Self Care | Payer: Medicare Other | Source: Ambulatory Visit | Attending: Internal Medicine | Admitting: Internal Medicine

## 2018-10-22 ENCOUNTER — Ambulatory Visit (INDEPENDENT_AMBULATORY_CARE_PROVIDER_SITE_OTHER): Payer: Medicare Other | Admitting: Internal Medicine

## 2018-10-22 VITALS — BP 128/70 | HR 69 | Ht 59.0 in | Wt 165.5 lb

## 2018-10-22 DIAGNOSIS — I442 Atrioventricular block, complete: Secondary | ICD-10-CM | POA: Diagnosis not present

## 2018-10-22 DIAGNOSIS — I10 Essential (primary) hypertension: Secondary | ICD-10-CM

## 2018-10-22 DIAGNOSIS — Z79899 Other long term (current) drug therapy: Secondary | ICD-10-CM

## 2018-10-22 DIAGNOSIS — I4819 Other persistent atrial fibrillation: Secondary | ICD-10-CM | POA: Diagnosis not present

## 2018-10-22 DIAGNOSIS — Z950 Presence of cardiac pacemaker: Secondary | ICD-10-CM

## 2018-10-22 LAB — HEPATIC FUNCTION PANEL
ALT: 21 U/L (ref 0–44)
AST: 22 U/L (ref 15–41)
Albumin: 4.8 g/dL (ref 3.5–5.0)
Alkaline Phosphatase: 41 U/L (ref 38–126)
Bilirubin, Direct: <0.1 mg/dL (ref 0.0–0.2)
Total Bilirubin: 0.3 mg/dL (ref 0.3–1.2)
Total Protein: 7.6 g/dL (ref 6.5–8.1)

## 2018-10-22 LAB — TSH: TSH: 0.844 u[IU]/mL (ref 0.350–4.500)

## 2018-10-22 NOTE — Patient Instructions (Signed)
Medication Instructions:  - Your physician recommends that you continue on your current medications as directed. Please refer to the Current Medication list given to you today.  If you need a refill on your cardiac medications before your next appointment, please call your pharmacy.   Lab work: - Your physician recommends that you have lab work today: Liver/ TSH  If you have labs (blood work) drawn today and your tests are completely normal, you will receive your results only by: Marland Kitchen MyChart Message (if you have MyChart) OR . A paper copy in the mail If you have any lab test that is abnormal or we need to change your treatment, we will call you to review the results.  Testing/Procedures: - none ordered  Follow-Up: At [facility], you and your health needs are our priority.  As part of our continuing mission to provide you with exceptional heart care, we have created designated Provider Care Teams.  These Care Teams include your primary Cardiologist (physician) and Advanced Practice Providers (APPs -  Physician Assistants and Nurse Practitioners) who all work together to provide you with the care you need, when you need it. . in 3 months with Dr. Caryl Comes  Any Other Special Instructions Will Be Listed Below (If Applicable). - N/A

## 2018-10-28 ENCOUNTER — Telehealth: Payer: Self-pay | Admitting: Internal Medicine

## 2018-10-28 MED ORDER — AMIODARONE HCL 200 MG PO TABS: ORAL_TABLET | ORAL | 6 refills | Status: DC

## 2018-10-28 MED ORDER — AMIODARONE HCL 200 MG PO TABS: ORAL_TABLET | ORAL | 2 refills | Status: DC

## 2018-10-28 NOTE — Telephone Encounter (Signed)
I spoke with the patient. She is stating that she needs her amiodarone RX to be from the [employer].  She prefers this to be from [facility], but if they do not have this manufacturer, then she would like the Amiodarone RX sent to [facility] as she has confirmed with them they do have the [employer].   She tried the [employer] and could not take this due to nausea.   I called [facility] at [phone] and was on hold for 20 minutes. A rep answered and advised that I call the direct pharmacy line at [phone].  I called this number- message states this is now an inactive # and I was disconnected.   I called back to [facility] [phone]. I was finally able to connect to another rep who transferred me to member services at [phone].  They spoke with the pharmacy team and I was connected to the pharmacy staff. I was advised they do have the [employer] brand- they will flag the patient's chart to dispense only this manufacturer.  The RX has been updated and the patient has been made aware.

## 2018-10-28 NOTE — Progress Notes (Signed)
Remote pacemaker transmission.   

## 2018-10-28 NOTE — Telephone Encounter (Signed)
Patient calling to speak with Darryll Capers she is aware of what drug company she prefers for her amiodarone medication  Please call to discuss

## 2018-11-01 ENCOUNTER — Other Ambulatory Visit: Payer: Self-pay | Admitting: Family Medicine

## 2018-11-01 DIAGNOSIS — E039 Hypothyroidism, unspecified: Secondary | ICD-10-CM

## 2018-11-03 ENCOUNTER — Ambulatory Visit: Payer: Medicare Other

## 2018-11-04 ENCOUNTER — Other Ambulatory Visit: Payer: Self-pay | Admitting: Family Medicine

## 2018-11-04 DIAGNOSIS — M25552 Pain in left hip: Principal | ICD-10-CM

## 2018-11-04 DIAGNOSIS — G8929 Other chronic pain: Secondary | ICD-10-CM

## 2018-11-17 ENCOUNTER — Telehealth: Payer: Self-pay

## 2018-11-17 NOTE — Telephone Encounter (Signed)
Patient scheduled for an AWV on 11/24/2018 with [facility], Due to Covid-19 pandemic this is unable to be done in office, called patient to see if they are able to do this virtually or if it needed to rescheduled for an in office for after June 2020. Unable to leave message  Direct call back [phone]

## 2018-11-18 ENCOUNTER — Other Ambulatory Visit: Payer: Self-pay | Admitting: Family Medicine

## 2018-11-18 DIAGNOSIS — F5101 Primary insomnia: Secondary | ICD-10-CM

## 2018-11-19 ENCOUNTER — Telehealth: Payer: Self-pay | Admitting: Family Medicine

## 2018-11-19 NOTE — Telephone Encounter (Signed)
Pt called  requesting refill on temazepam 30 mg called into  [facility]

## 2018-11-19 NOTE — Telephone Encounter (Signed)
Patient scheduled for an AWV on 11/24/2018 with [facility], Due to Covid-19 pandemic this is unable to be done in office, called patient to see if she was able to do this virtually. Patient unable to access any video call applications. She prefers to cancel appt for now and will reschedule when things go back to normal

## 2018-11-23 ENCOUNTER — Telehealth: Payer: Self-pay | Admitting: Family Medicine

## 2018-11-23 DIAGNOSIS — E1122 Type 2 diabetes mellitus with diabetic chronic kidney disease: Secondary | ICD-10-CM

## 2018-11-23 DIAGNOSIS — IMO0002 Reserved for concepts with insufficient information to code with codable children: Secondary | ICD-10-CM

## 2018-11-23 DIAGNOSIS — E1165 Type 2 diabetes mellitus with hyperglycemia: Principal | ICD-10-CM

## 2018-11-23 MED ORDER — GLUCOSE BLOOD VI STRP: ORAL_STRIP | 11 refills | Status: DC

## 2018-11-23 NOTE — Telephone Encounter (Signed)
Pt called requesting her glucose blood test strip  Called into   [facility]

## 2018-11-24 ENCOUNTER — Ambulatory Visit: Payer: Medicare Other

## 2018-11-26 LAB — CUP PACEART INCLINIC DEVICE CHECK
Implantable Lead Implant Date: 20141217
Implantable Lead Implant Date: 20141217
Implantable Lead Location: 753859
Implantable Lead Model: 5076
Implantable Lead Model: 5076
Implantable Pulse Generator Implant Date: 20141217
MDC IDC LEAD LOCATION: 753860
MDC IDC SESS DTM: 20200423134312

## 2018-12-11 ENCOUNTER — Telehealth: Payer: Self-pay | Admitting: Family Medicine

## 2018-12-11 ENCOUNTER — Other Ambulatory Visit: Payer: Self-pay

## 2018-12-11 DIAGNOSIS — IMO0002 Reserved for concepts with insufficient information to code with codable children: Secondary | ICD-10-CM

## 2018-12-11 DIAGNOSIS — E1122 Type 2 diabetes mellitus with diabetic chronic kidney disease: Secondary | ICD-10-CM

## 2018-12-11 MED ORDER — SITAGLIPTIN PHOS-METFORMIN HCL 50-500 MG PO TABS: 1.0000 | ORAL_TABLET | Freq: Two times a day (BID) | ORAL | 1 refills | Status: DC

## 2018-12-11 NOTE — Telephone Encounter (Signed)
Pt. Called requesting refill on janumet 50/500 twice  A day  [facility]

## 2018-12-29 ENCOUNTER — Other Ambulatory Visit: Payer: Medicare Other

## 2018-12-29 ENCOUNTER — Other Ambulatory Visit: Payer: Self-pay

## 2018-12-29 ENCOUNTER — Telehealth: Payer: Self-pay

## 2018-12-29 DIAGNOSIS — N183 Chronic kidney disease, stage 3 unspecified: Secondary | ICD-10-CM

## 2018-12-29 DIAGNOSIS — E1121 Type 2 diabetes mellitus with diabetic nephropathy: Secondary | ICD-10-CM

## 2018-12-29 DIAGNOSIS — E039 Hypothyroidism, unspecified: Secondary | ICD-10-CM

## 2018-12-29 DIAGNOSIS — I10 Essential (primary) hypertension: Secondary | ICD-10-CM

## 2018-12-29 NOTE — Telephone Encounter (Signed)
The pt called to verify upcoming appt.

## 2018-12-30 LAB — CBC WITH DIFFERENTIAL/PLATELET
Absolute Monocytes: 388 cells/uL (ref 200–950)
Basophils Absolute: 29 cells/uL (ref 0–200)
Basophils Relative: 0.5 %
Eosinophils Absolute: 51 cells/uL (ref 15–500)
Eosinophils Relative: 0.9 %
HCT: 32.6 % — ABNORMAL LOW (ref 35.0–45.0)
Hemoglobin: 10.3 g/dL — ABNORMAL LOW (ref 11.7–15.5)
Lymphs Abs: 1607 cells/uL (ref 850–3900)
MCH: 28.1 pg (ref 27.0–33.0)
MCHC: 31.6 g/dL — ABNORMAL LOW (ref 32.0–36.0)
MCV: 89.1 fL (ref 80.0–100.0)
MPV: 12.1 fL (ref 7.5–12.5)
Monocytes Relative: 6.8 %
Neutro Abs: 3625 cells/uL (ref 1500–7800)
Neutrophils Relative %: 63.6 %
Platelets: 202 10*3/uL (ref 140–400)
RBC: 3.66 10*6/uL — ABNORMAL LOW (ref 3.80–5.10)
RDW: 15.9 % — ABNORMAL HIGH (ref 11.0–15.0)
Total Lymphocyte: 28.2 %
WBC: 5.7 10*3/uL (ref 3.8–10.8)

## 2018-12-30 LAB — COMPLETE METABOLIC PANEL WITH GFR
AG Ratio: 1.9 (calc) (ref 1.0–2.5)
ALT: 14 U/L (ref 6–29)
AST: 12 U/L (ref 10–35)
Albumin: 4.6 g/dL (ref 3.6–5.1)
Alkaline phosphatase (APISO): 42 U/L (ref 37–153)
BUN/Creatinine Ratio: 19 (calc) (ref 6–22)
BUN: 34 mg/dL — ABNORMAL HIGH (ref 7–25)
CO2: 26 mmol/L (ref 20–32)
Calcium: 10.5 mg/dL — ABNORMAL HIGH (ref 8.6–10.4)
Chloride: 103 mmol/L (ref 98–110)
Creat: 1.78 mg/dL — ABNORMAL HIGH (ref 0.50–0.99)
GFR, Est African American: 33 mL/min/{1.73_m2} — ABNORMAL LOW (ref >=60)
GFR, Est Non African American: 29 mL/min/{1.73_m2} — ABNORMAL LOW (ref >=60)
Globulin: 2.4 g/dL (calc) (ref 1.9–3.7)
Glucose, Bld: 159 mg/dL — ABNORMAL HIGH (ref 65–99)
Potassium: 4.5 mmol/L (ref 3.5–5.3)
Sodium: 141 mmol/L (ref 135–146)
Total Bilirubin: 0.3 mg/dL (ref 0.2–1.2)
Total Protein: 7 g/dL (ref 6.1–8.1)

## 2018-12-30 LAB — TSH: TSH: 1.01 mIU/L (ref 0.40–4.50)

## 2018-12-30 LAB — LIPID PANEL
Cholesterol: 126 mg/dL (ref <200)
HDL: 42 mg/dL — ABNORMAL LOW (ref >=50)
LDL Cholesterol (Calc): 60 mg/dL (calc)
Non-HDL Cholesterol (Calc): 84 mg/dL (calc) (ref <130)
Total CHOL/HDL Ratio: 3 (calc) (ref <5.0)
Triglycerides: 153 mg/dL — ABNORMAL HIGH (ref <150)

## 2018-12-30 LAB — HEMOGLOBIN A1C
Hgb A1c MFr Bld: 7.4 % of total Hgb — ABNORMAL HIGH (ref <5.7)
Mean Plasma Glucose: 166 (calc)
eAG (mmol/L): 9.2 (calc)

## 2018-12-30 LAB — T4, FREE: Free T4: 1.7 ng/dL (ref 0.8–1.8)

## 2019-01-01 ENCOUNTER — Other Ambulatory Visit: Payer: Self-pay

## 2019-01-01 ENCOUNTER — Encounter: Payer: Self-pay | Admitting: Family Medicine

## 2019-01-01 ENCOUNTER — Ambulatory Visit (INDEPENDENT_AMBULATORY_CARE_PROVIDER_SITE_OTHER): Payer: Medicare Other | Admitting: Family Medicine

## 2019-01-01 DIAGNOSIS — N183 Chronic kidney disease, stage 3 unspecified: Secondary | ICD-10-CM

## 2019-01-01 DIAGNOSIS — I129 Hypertensive chronic kidney disease with stage 1 through stage 4 chronic kidney disease, or unspecified chronic kidney disease: Secondary | ICD-10-CM

## 2019-01-01 DIAGNOSIS — E039 Hypothyroidism, unspecified: Secondary | ICD-10-CM

## 2019-01-01 DIAGNOSIS — E1121 Type 2 diabetes mellitus with diabetic nephropathy: Secondary | ICD-10-CM

## 2019-01-01 DIAGNOSIS — D649 Anemia, unspecified: Secondary | ICD-10-CM | POA: Insufficient documentation

## 2019-01-01 DIAGNOSIS — I272 Pulmonary hypertension, unspecified: Secondary | ICD-10-CM | POA: Insufficient documentation

## 2019-01-01 NOTE — Assessment & Plan Note (Signed)
Elevated A1c 7.4, mild increase from prior, still near goal < 8 No hypoglycemia Complications - CKD-III, other including, GERD, obesity, hypothyroidism, Gout - increases risk of future cardiovascular complications  - OFF Sulfonylurea  Plan:  1. Continue current therapy - Janumet 50-500mg  BID - discussed at risk in future if worsening Creatinine kidney function may warrant change of med again, similar discussion in past - needs to discuss with Nephrology Dr Juleen China 2. Encourage improved lifestyle - low carb, low sugar diet, reduce portion size, continue improving regular exercise 3. Check CBG, bring log to next visit for review 4. Continue ACEi, Statin 5. Follow-up 4 months

## 2019-01-01 NOTE — Assessment & Plan Note (Signed)
Controlled HTN Complication with CKD-III, CHB pacer, AFib Followed by Cardiology, Nephrology    Plan:  1. Continue current BP regimen - Metoprolol-XL 100mg  daily, Amlodipine 10mg , Lisinopril 20mg  daily - Furosemide 40mg  daily (x2 of 20mg  tabs) per Cardiology 2. Encourage improved lifestyle - low sodium diet, regular exercise 3. Continue monitor BP outside office, bring readings to next visit, if persistently >140/90 or new symptoms notify office sooner

## 2019-01-01 NOTE — Assessment & Plan Note (Signed)
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

## 2019-01-01 NOTE — Assessment & Plan Note (Signed)
Controlled TSH normal On levothyroxine 84mcg daily - continue

## 2019-01-01 NOTE — Patient Instructions (Addendum)
Thank you for coming to the office today.  Follow up with Dr Juleen China as scheduled  Keep on current meds  Add iron supplement with food  Please schedule a Follow-up Appointment to: Return in about 4 months (around 05/04/2019) for DM A1c, CKD, HTN.  If you have any other questions or concerns, please feel free to call the office or send a message through [place]. You may also schedule an earlier appointment if necessary.  Additionally, you may be receiving a survey about your experience at our office within a few days to 1 week by e-mail or mail. We value your feedback.  [name], DO [place]

## 2019-01-01 NOTE — Progress Notes (Signed)
Subjective:    Patient ID: Renee Boyd, female    DOB: April 02, 1951, 68 y.o.   MRN: [id number]  Renee Boyd is a 68 y.o. female presenting on 01/01/2019 for Diabetes (average bs 120-150 range ) and Shoulder Pain (chronic left shoulder pain. Pt states the weather made the pain worse. Pt also complains of chronic lower back pain that radiates to her stomach )  Virtual / Telehealth Encounter - Telephone  The purpose of this virtual visit is to provide medical care while limiting exposure to the novel coronavirus (COVID19) for both patient and office staff.  Consent was obtained for remote visit:  Yes.   Answered questions that patient had about telehealth interaction:  Yes.   I discussed the limitations, risks, security and privacy concerns of performing an evaluation and management service by video/telephone. I also discussed with the patient that there may be a patient responsible charge related to this service. The patient expressed understanding and agreed to proceed.  Patient Location: Home Provider Location: [facility] (Office)   HPI   CHRONIC DM, Type 2with CKD-III Interval update, lab result A1c 7.4 (12/2018), previously 7.1. Attributed to some poor dietary choices. CBGs:does not have cbg log today Meds:Janumet 50-500mg  BID (no longer on Glimpeiride) Reports good compliance. Tolerating well w/o side-effects Currently on ACEi- per renal Lifestyle: - Diet (admits needs to return to DM diet, low carb - admits eating too many sweets since had good A1c result previously) - Exercise (walking regularly for exercise) [facility] - updated DM Eye exam in 01/2018 - will be due again soon Denies hypoglycemia, polyuria, visual changes, numbness or tingling.  Left Shoulder Pain / Chronic Bursitis Reports chronic problem ,similar to issue before, taking Tylenol and Tramadol, due to CKD cannot take NSAIDs. - Admits Episodic worsening flare up with shoulder and back due  to arthritis, and recent rainy weather. Takes Tylenol Arthritis strength PRN and Tramadol PRN infrequently - Denies any fall or injury swelling or redness  Hypothyroidism Last lab 12/2018 TSH normal. Controlled on levothyroxine 78mcg daily  CKD-III / Normocytic Anemia / HTN Followed by Dr Juleen China, next month, following q 3 months monitoring kidney function, now last lab shows increased Cr to 1.7 and reduced GFR similar to previous results History of blood and plasma transfusion. Hgb down to 10.3, previously 11 range. Not taking any NSAID. Trying to stay hydrated. Admits some colder temperature sensation Denies reduced urine out put, dyspnea, fatigue, near syncope, dark urine, dysuria, hematuria  Depression screen [facility] 2/9 07/01/2018 03/27/2018 12/19/2017  Decreased Interest 0 0 0  Down, Depressed, Hopeless 0 0 0  PHQ - 2 Score 0 0 0  Altered sleeping - - -  Tired, decreased energy - - -  Change in appetite - - -  Feeling bad or failure about yourself  - - -  Trouble concentrating - - -  Moving slowly or fidgety/restless - - -  Suicidal thoughts - - -  PHQ-9 Score - - -    Social History   Tobacco Use  . Smoking status: Former Smoker    Packs/day: 2.00    Years: 40.00    Pack years: 80.00    Types: Cigarettes    Last attempt to quit: 06/27/2005    Years since quitting: 13.5  . Smokeless tobacco: Former Systems developer  . Tobacco comment: quit Jun 27 2005  Substance Use Topics  . Alcohol use: No  . Drug use: No    Review of  Systems Per HPI unless specifically indicated above     Objective:    There were no vitals taken for this visit.  Wt Readings from Last 3 Encounters:  10/22/18 165 lb 8 oz (75.1 kg)  09/10/18 173 lb 8 oz (78.7 kg)  07/07/18 170 lb 4 oz (77.2 kg)    Physical Exam   No physical exam done. Telephone remote virtual visit today.  Results for orders placed or performed in visit on 12/29/18  T4, free  Result Value Ref Range   Free T4 1.7 0.8 - 1.8 ng/dL   TSH  Result Value Ref Range   TSH 1.01 0.40 - 4.50 mIU/L  Lipid panel  Result Value Ref Range   Cholesterol 126 <200 mg/dL   HDL 42 (L) > OR = 50 mg/dL   Triglycerides 153 (H) <150 mg/dL   LDL Cholesterol (Calc) 60 mg/dL (calc)   Total CHOL/HDL Ratio 3.0 <5.0 (calc)   Non-HDL Cholesterol (Calc) 84 <130 mg/dL (calc)  COMPLETE METABOLIC PANEL WITH GFR  Result Value Ref Range   Glucose, Bld 159 (H) 65 - 99 mg/dL   BUN 34 (H) 7 - 25 mg/dL   Creat 1.78 (H) 0.50 - 0.99 mg/dL   GFR, Est Non African American 29 (L) > OR = 60 mL/min/1.52m2   GFR, Est African American 33 (L) > OR = 60 mL/min/1.46m2   BUN/Creatinine Ratio 19 6 - 22 (calc)   Sodium 141 135 - 146 mmol/L   Potassium 4.5 3.5 - 5.3 mmol/L   Chloride 103 98 - 110 mmol/L   CO2 26 20 - 32 mmol/L   Calcium 10.5 (H) 8.6 - 10.4 mg/dL   Total Protein 7.0 6.1 - 8.1 g/dL   Albumin 4.6 3.6 - 5.1 g/dL   Globulin 2.4 1.9 - 3.7 g/dL (calc)   AG Ratio 1.9 1.0 - 2.5 (calc)   Total Bilirubin 0.3 0.2 - 1.2 mg/dL   Alkaline phosphatase (APISO) 42 37 - 153 U/L   AST 12 10 - 35 U/L   ALT 14 6 - 29 U/L  CBC with Differential/Platelet  Result Value Ref Range   WBC 5.7 3.8 - 10.8 Thousand/uL   RBC 3.66 (L) 3.80 - 5.10 Million/uL   Hemoglobin 10.3 (L) 11.7 - 15.5 g/dL   HCT 32.6 (L) 35.0 - 45.0 %   MCV 89.1 80.0 - 100.0 fL   MCH 28.1 27.0 - 33.0 pg   MCHC 31.6 (L) 32.0 - 36.0 g/dL   RDW 15.9 (H) 11.0 - 15.0 %   Platelets 202 140 - 400 Thousand/uL   MPV 12.1 7.5 - 12.5 fL   Neutro Abs 3,625 1,500 - 7,800 cells/uL   Lymphs Abs 1,607 850 - 3,900 cells/uL   Absolute Monocytes 388 200 - 950 cells/uL   Eosinophils Absolute 51 15 - 500 cells/uL   Basophils Absolute 29 0 - 200 cells/uL   Neutrophils Relative % 63.6 %   Total Lymphocyte 28.2 %   Monocytes Relative 6.8 %   Eosinophils Relative 0.9 %   Basophils Relative 0.5 %  Hemoglobin A1c  Result Value Ref Range   Hgb A1c MFr Bld 7.4 (H) <5.7 % of total Hgb   Mean Plasma Glucose 166  (calc)   eAG (mmol/L) 9.2 (calc)      Assessment & Plan:   Problem List Items Addressed This Visit    Benign hypertension with CKD (chronic kidney disease) stage III (HCC)    Controlled HTN Complication with CKD-III, CHB pacer,  AFib Followed by Cardiology, Nephrology    Plan:  1. Continue current BP regimen - Metoprolol-XL 100mg  daily, Amlodipine 10mg , Lisinopril 20mg  daily - Furosemide 40mg  daily (x2 of 20mg  tabs) per Cardiology 2. Encourage improved lifestyle - low sodium diet, regular exercise 3. Continue monitor BP outside office, bring readings to next visit, if persistently >140/90 or new symptoms notify office sooner      Hypothyroidism    Controlled TSH normal On levothyroxine 98mcg daily - continue      Normocytic anemia    Increased cold intolerance now w/ Mild reduced Hgb to 10 - previously 11 range Chronic History of known anemia similarly Hgb 8-10 in past, symptomatic Limited other anemia symptoms  Likely secondary to CKD, needs to discuss further with Nephrology  Trial on oral iron supplement for now w/ food - follow-up closely      Stage 3 chronic kidney disease (HCC)    Elevated Creatinine back up to 1.7, GFR reduced around 30 Followed by [facility] Dr Juleen China Secondary CKD due to HTN, DM Age  Follow-up w/ Nephrology within a month, they are checking labs q 3 months and following her, concern she is having progressive reduced kidney function Continue current meds - need to discuss med adjust for renal dysfunction with Nephrology  Also at risk of anemia due to CKD      Type 2 diabetes with nephropathy ([place]) - Primary    Elevated A1c 7.4, mild increase from prior, still near goal < 8 No hypoglycemia Complications - CKD-III, other including, GERD, obesity, hypothyroidism, Gout - increases risk of future cardiovascular complications  - OFF Sulfonylurea  Plan:  1. Continue current therapy - Janumet 50-500mg  BID - discussed at risk in future if  worsening Creatinine kidney function may warrant change of med again, similar discussion in past - needs to discuss with Nephrology Dr Juleen China 2. Encourage improved lifestyle - low carb, low sugar diet, reduce portion size, continue improving regular exercise 3. Check CBG, bring log to next visit for review 4. Continue ACEi, Statin 5. Follow-up 4 months         No orders of the defined types were placed in this encounter.   Follow up plan: Return in about 4 months (around 05/04/2019) for DM A1c, CKD, HTN.  Patient verbalizes understanding with the above medical recommendations including the limitation of remote medical advice.  Specific follow-up and call-back criteria were given for patient to follow-up or seek medical care more urgently if needed.  Total duration of direct patient care provided via telephone: 14 minutes   [name], [facility] 01/01/2019, 11:23 AM

## 2019-01-01 NOTE — Assessment & Plan Note (Signed)
Increased cold intolerance now w/ Mild reduced Hgb to 10 - previously 11 range Chronic History of known anemia similarly Hgb 8-10 in past, symptomatic Limited other anemia symptoms  Likely secondary to CKD, needs to discuss further with Nephrology  Trial on oral iron supplement for now w/ food - follow-up closely

## 2019-01-11 DIAGNOSIS — H40003 Preglaucoma, unspecified, bilateral: Secondary | ICD-10-CM | POA: Diagnosis not present

## 2019-01-11 LAB — HM DIABETES EYE EXAM

## 2019-01-18 ENCOUNTER — Other Ambulatory Visit: Payer: Self-pay | Admitting: Family Medicine

## 2019-01-18 DIAGNOSIS — F5101 Primary insomnia: Secondary | ICD-10-CM

## 2019-01-19 ENCOUNTER — Encounter: Payer: Medicare Other | Admitting: *Deleted

## 2019-01-20 ENCOUNTER — Telehealth: Payer: Self-pay

## 2019-01-20 NOTE — Telephone Encounter (Signed)
Spoke with patient to remind of missed remote transmission 

## 2019-01-21 DIAGNOSIS — N184 Chronic kidney disease, stage 4 (severe): Secondary | ICD-10-CM | POA: Diagnosis not present

## 2019-01-21 DIAGNOSIS — E1122 Type 2 diabetes mellitus with diabetic chronic kidney disease: Secondary | ICD-10-CM | POA: Diagnosis not present

## 2019-01-21 DIAGNOSIS — I129 Hypertensive chronic kidney disease with stage 1 through stage 4 chronic kidney disease, or unspecified chronic kidney disease: Secondary | ICD-10-CM | POA: Diagnosis not present

## 2019-01-21 DIAGNOSIS — E875 Hyperkalemia: Secondary | ICD-10-CM | POA: Diagnosis not present

## 2019-01-21 DIAGNOSIS — R8290 Unspecified abnormal findings in urine: Secondary | ICD-10-CM | POA: Diagnosis not present

## 2019-01-26 ENCOUNTER — Telehealth (INDEPENDENT_AMBULATORY_CARE_PROVIDER_SITE_OTHER): Payer: Medicare Other | Admitting: Internal Medicine

## 2019-01-26 ENCOUNTER — Other Ambulatory Visit: Payer: Self-pay

## 2019-01-26 DIAGNOSIS — Z950 Presence of cardiac pacemaker: Secondary | ICD-10-CM | POA: Diagnosis not present

## 2019-01-26 DIAGNOSIS — I442 Atrioventricular block, complete: Secondary | ICD-10-CM

## 2019-01-26 DIAGNOSIS — R0602 Shortness of breath: Secondary | ICD-10-CM | POA: Diagnosis not present

## 2019-01-26 NOTE — Progress Notes (Signed)
Electrophysiology TeleHealth Note   Due to national recommendations of social distancing due to COVID 19, an audio/video telehealth visit is felt to be most appropriate for this patient at this time.  See MyChart message from today for the patient's consent to telehealth for [facility].   Date:  01/26/2019   ID:  [name], [name] 05/10/1951, MRN [id number]  Location: patient's home  Provider location: [address], [place]  Evaluation Performed: Follow-up visit  PCP:  Olin Hauser, DO  Cardiologist:     Electrophysiologist:  SK   Chief Complaint:     History of Present Illness:    Renee Boyd is a 68 y.o. female who presents via audio/video conferencing for a telehealth visit today. The patient did not have access to video technology/had technical difficulties with video requiring transitioning to audio format only (telephone).  All issues noted in this document were discussed and addressed.  No physical exam could be performed with this format.      Since last being seen in our clinic for atrial fibrillation-permanent with pacemaker for sinus node dysfn and CHB, the patient reports an episode of hyperacuity and loud noises in L ear now w tinnitus getting better  The patient denies chest pain, shortness of breath, nocturnal dyspnea , orthopnea  trace peripheral edema .  There have been no palpitations , lightheadedness  or syncope .   Patient denies symptoms of GI intolerance, sun sensitivity, neurological symptoms attributable to amiodarone.         Records and Results Reviewed Date Cr K Hgb LFTs TSH  10/17 1.36  8.8  0.304  2/18 1.73 4.9   0.6  6/18  1.3  11.1    1/20 1.2 4.3 11.3 18(2/20) 1.298 (2/20)  5/20 1.78 4.5 10.3 14 1.01   DATE TEST EF   7/17 Echo   60-65 % Pulm HTN 64 (no E/E')  12/19  Echo  50-55% LVH severe (17/16)   PAsys 50-55   MR mild LAE(37/2.0/34)  1/20 CTA  CAD 2V   FFR > 0.8 LAD/RCA     The  patient denies symptoms of fevers, chills, cough, or new SOB worrisome for COVID 19.    Past Medical History:  Diagnosis Date  . Chronic kidney disease (CKD), stage III (moderate) (HCC)   . Complete heart block ([place])    a. s/p MDT PPM 2006 with device generator replacement 2014; b. followed by Dr. Caryl Comes  . GERD (gastroesophageal reflux disease)   . Hypertension   . Hypothyroid   . Pacemaker   . Persistent atrial fibrillation    a. on Eliquis; b. CHADS2VASc 6 (HTN. age x 1, DM, TIA x 2, female); c. s/p DCCV 05/28/18  . Pulmonary hypertension ([place])    a. echo 2017: EF of 60-65%, no RWMA, mildly dilated left atrium, RVSF normal, PASP 64 mmHg  . TIA (transient ischemic attack)     Past Surgical History:  Procedure Laterality Date  . ABDOMINAL HYSTERECTOMY    . CARDIOVERSION N/A 05/28/2018   Procedure: CARDIOVERSION;  Surgeon: Minna Merritts, MD;  Location: [facility] ORS;  Service: Cardiovascular;  Laterality: N/A;  . CHOLECYSTECTOMY    . TONSILLECTOMY    . TUBAL LIGATION      Current Outpatient Medications  Medication Sig Dispense Refill  . allopurinol (ZYLOPRIM) 100 MG tablet Take 1 tablet (100 mg total) by mouth daily. 90 tablet 3  . amiodarone (PACERONE) 200 MG tablet Take  1/2 tablet (100 mg) by mouth twice daily 90 tablet 2  . amLODipine (NORVASC) 10 MG tablet Take 1 tablet (10 mg total) by mouth daily. 90 tablet 3  . apixaban (ELIQUIS) 5 MG TABS tablet Take 1 tablet (5 mg total) by mouth 2 (two) times daily. 180 tablet 2  . atorvastatin (LIPITOR) 20 MG tablet TAKE 1 TABLET DAILY (Patient taking differently: 40 mg. ( takes 2- 20 mg tablets a day.)) 90 tablet 2  . Baclofen 5 MG TABS Take 5 mg by mouth at bedtime. 90 tablet 3  . calcitRIOL (ROCALTROL) 0.25 MCG capsule     . cholecalciferol (VITAMIN D3) 25 MCG (1000 UT) tablet Take 1,000 Units by mouth. 3 x weekly    . desonide (DESOWEN) 0.05 % lotion Apply 1 application topically 2 (two) times daily as needed. For up to 1 week max,  caution if using on face. 118 mL 3  . ferrous sulfate 325 (65 FE) MG EC tablet Take 325 mg by mouth daily.    . furosemide (LASIX) 20 MG tablet TAKE 2 TABLETS BY MOUTH EVERY MORNING (Patient taking differently: Take 40 mg by mouth daily. ) 180 tablet 3  . gabapentin (NEURONTIN) 300 MG capsule Take 1 capsule (300 mg total) by mouth 2 (two) times daily. 180 capsule 3  . glucose blood test strip Check blood sugar twice daily as instructed 100 each 11  . ibandronate (BONIVA) 150 MG tablet Take 1 tablet (150 mg total) by mouth every 30 (thirty) days. Take in the morning with a full glass of water, on an empty stomach 3 tablet 3  . levothyroxine (SYNTHROID, LEVOTHROID) 25 MCG tablet TAKE 1 TABLET DAILY BEFORE BREAKFAST 90 tablet 3  . lisinopril (PRINIVIL,ZESTRIL) 20 MG tablet Take 1 tablet (20 mg total) by mouth daily. 90 tablet 3  . metoprolol succinate (TOPROL-XL) 100 MG 24 hr tablet Take 1 tablet (100 mg total) by mouth daily. Take with or immediately following a meal. 90 tablet 3  . omeprazole (PRILOSEC) 20 MG capsule TAKE 1 CAPSULE BY MOUTH TWICE A DAY BEFORE A MEAL (Patient taking differently: Take 20 mg by mouth 2 (two) times daily before a meal. ) 180 capsule 3  . sitaGLIPtin-metformin (JANUMET) 50-500 MG tablet Take 1 tablet by mouth 2 (two) times daily with a meal. 180 tablet 1  . temazepam (RESTORIL) 30 MG capsule TAKE 1 CAPSULE BY MOUTH AT BEDTIME AS NEEDED SLEEP 30 capsule 2  . traMADol (ULTRAM) 50 MG tablet TAKE 1 TABLET BY MOUTH TWICE DAILY 60 tablet 2   No current facility-administered medications for this visit.     Allergies:   Morphine and related and Percocet [oxycodone-acetaminophen]   Social History:  The patient  reports that she quit smoking about 13 years ago. Her smoking use included cigarettes. She has a 80.00 pack-year smoking history. She has quit using smokeless tobacco. She reports that she does not drink alcohol or use drugs.   Family History:  The patient's   family  history includes Alcohol abuse in her father; Autism in her son; Cancer in her son; Heart disease in her maternal grandmother and mother; Hypertension in an other family member; Miscarriages / [place] in her maternal aunt.   ROS:  Please see the history of present illness.   All other systems are personally reviewed and negative.    Exam:    Vital Signs:  There were no vitals taken for this visit.    Well appearing, alert and conversant,  regular work of breathing,  good skin color Eyes- anicteric, neuro- grossly intact, skin- no apparent rash or lesions or cyanosis, mouth- oral mucosa is pink   Labs/Other Tests and Data Reviewed:    Recent Labs: 12/29/2018: ALT 14; BUN 34; Creat 1.78; Hemoglobin 10.3; Platelets 202; Potassium 4.5; Sodium 141; TSH 1.01   Wt Readings from Last 3 Encounters:  10/22/18 165 lb 8 oz (75.1 kg)  09/10/18 173 lb 8 oz (78.7 kg)  07/07/18 170 lb 4 oz (77.2 kg)     Other studies personally reviewed: Additional studies/ records that were reviewed today include: *As above Review of the above records today demonstrates   **  Last device remote is reviewed from [facility] PDF dated 3/20 which reveals normal device function,   arrhythmias - atrial fib converted to sinus   ASSESSMENT & PLAN:     Atrial fibrillation/flutter long-term persistent  Ventricular tachycardia-nonsustained  Hypertension   Pacemaker  Medtronic   DM-nephropathy  Complete heart block  PVCs  Sleep disordered breathing and daytime somnolence  Blood pressure  Euvolemic continue current meds  On Anticoagulation;  No bleeding issues   No intercurrent Ventricular tachycardia  Encouraged to restrain her diet  Will get VS tomorrow from renal visit   COVID 19 screen The patient denies symptoms of COVID 19 at this time.  The importance of social distancing was discussed today.  Follow-up:  *72m Next remote: As Scheduled   Current medicines are reviewed at length  with the patient today.   The patient does not have concerns regarding her medicines.  The following changes were made today:  none  Labs/ tests ordered today include:   No orders of the defined types were placed in this encounter.   Future tests ( post COVID )    Patient Risk:  after full review of this patients clinical status, I feel that they are at moderate risk at this time.  Today, I have spent 18 minutes with the patient with telehealth technology discussing the above.  Signed, Virl Axe, MD  01/26/2019 10:53 AM     [address] [place] [id number] [phone] (office) [phone] (fax)

## 2019-01-26 NOTE — Patient Instructions (Addendum)
Medication Instructions:  - Your physician recommends that you continue on your current medications as directed. Please refer to the Current Medication list given to you today.  If you need a refill on your cardiac medications before your next appointment, please call your pharmacy.   Lab work: - none ordered  If you have labs (blood work) drawn today and your tests are completely normal, you will receive your results only by: [facility] Kitchen MyChart Message (if you have MyChart) OR . A paper copy in the mail If you have any lab test that is abnormal or we need to change your treatment, we will call you to review the results.  Testing/Procedures: - none ordered  Follow-Up: At [facility], you and your health needs are our priority.  As part of our continuing mission to provide you with exceptional heart care, we have created designated Provider Care Teams.  These Care Teams include your primary Cardiologist (physician) and Advanced Practice Providers (APPs -  Physician Assistants and Nurse Practitioners) who all work together to provide you with the care you need, when you need it.  You will need a follow up appointment in 1 year with Dr. Caryl Comes.   Please call our office 2 months in advance to schedule this appointment.  (call in early April 2021 to schedule)   Any Other Special Instructions Will Be Listed Below (If Applicable). - N/A

## 2019-01-27 ENCOUNTER — Telehealth: Payer: Self-pay | Admitting: Internal Medicine

## 2019-01-27 DIAGNOSIS — I129 Hypertensive chronic kidney disease with stage 1 through stage 4 chronic kidney disease, or unspecified chronic kidney disease: Secondary | ICD-10-CM | POA: Diagnosis not present

## 2019-01-27 DIAGNOSIS — D631 Anemia in chronic kidney disease: Secondary | ICD-10-CM | POA: Diagnosis not present

## 2019-01-27 DIAGNOSIS — N2581 Secondary hyperparathyroidism of renal origin: Secondary | ICD-10-CM | POA: Diagnosis not present

## 2019-01-27 DIAGNOSIS — E1122 Type 2 diabetes mellitus with diabetic chronic kidney disease: Secondary | ICD-10-CM | POA: Diagnosis not present

## 2019-01-27 DIAGNOSIS — N183 Chronic kidney disease, stage 3 (moderate): Secondary | ICD-10-CM | POA: Diagnosis not present

## 2019-01-27 NOTE — Telephone Encounter (Signed)
Noted  

## 2019-01-27 NOTE — Telephone Encounter (Signed)
To Dr. Klein to review. 

## 2019-01-27 NOTE — Telephone Encounter (Signed)
Todays visit with another provider - please chart with [facility] virtual   Height   5'1  Weight  173   bp  Sit 131/70  Stand 120/72   HR   62  bmi   32.7  o2   100%  Temp   97.8   A1c 7.4 patient getting referral to diabetic doctor

## 2019-01-28 ENCOUNTER — Encounter: Payer: Self-pay | Admitting: Family Medicine

## 2019-01-28 ENCOUNTER — Other Ambulatory Visit: Payer: Self-pay

## 2019-01-28 ENCOUNTER — Ambulatory Visit (INDEPENDENT_AMBULATORY_CARE_PROVIDER_SITE_OTHER): Payer: Medicare Other | Admitting: Family Medicine

## 2019-01-28 VITALS — Ht 59.0 in | Wt 173.0 lb

## 2019-01-28 DIAGNOSIS — N184 Chronic kidney disease, stage 4 (severe): Secondary | ICD-10-CM | POA: Diagnosis not present

## 2019-01-28 DIAGNOSIS — E1121 Type 2 diabetes mellitus with diabetic nephropathy: Secondary | ICD-10-CM | POA: Diagnosis not present

## 2019-01-28 DIAGNOSIS — H9192 Unspecified hearing loss, left ear: Secondary | ICD-10-CM | POA: Diagnosis not present

## 2019-01-28 DIAGNOSIS — H9312 Tinnitus, left ear: Secondary | ICD-10-CM | POA: Diagnosis not present

## 2019-01-28 MED ORDER — RYBELSUS 3 MG PO TABS: 3.0000 mg | ORAL_TABLET | Freq: Every day | ORAL | 0 refills | Status: DC

## 2019-01-28 NOTE — Progress Notes (Signed)
Virtual Visit via Telephone The purpose of this virtual visit is to provide medical care while limiting exposure to the novel coronavirus (COVID19) for both patient and office staff.  Consent was obtained for phone visit:  Yes.   Answered questions that patient had about telehealth interaction:  Yes.   I discussed the limitations, risks, security and privacy concerns of performing an evaluation and management service by telephone. I also discussed with the patient that there may be a patient responsible charge related to this service. The patient expressed understanding and agreed to proceed.  Patient Location: Home Provider Location: [facility])  ---------------------------------------------------------------------- Chief Complaint  Patient presents with  . Ear Problem    The pt state she's been hearing a really loud noise in her left ear x 1 mth. No pain in the left ear, drainage, or fullness.     S: Reviewed CMA documentation. I have called patient and gathered additional HPI as follows:  Left Ear Pain Reports symptoms acute onset few weeks to month ago, sudden loud noise within her ear only, sounded like a horn, she had no pain at all but could not hear well out of her ear and it sounded like an echo within her head, still without pain. She describes that the sound in her at times has changed sounds. She tries to keep busy and using white noise to cover up the sound, which has improved. Overall this has gradually improved now, and the hearing / sound in her ear has improved some, it has eased down significantly. Sometimes now she hears a humming or a beating drum, then it may go away and be episodic. She has taken OTC Claritin PRN. Denies ear pain, drainage, discharge, sinus congestion pressure, headache  CHRONIC DM, Type 2with CKD-IV Interval update, lab result A1c 7.4 (12/2018), previously 7.1. Attributed to some poor dietary choices. - Newest update, notified  by her Nephrology Dr Juleen China at [facility] that declining kidney function down to GFR < 30, recommended endocrinology consult vs GLP1 agent yesterday, needs to stop metformin CBGs:improved CBGs, checking on contour meter Meds:Janumet 50-500mg  BID (no longer on Glimpeiride) Reports good compliance. Tolerating well w/o side-effects Currently on ACEi- per renal Lifestyle: - Diet (improved diet) - Exercise (walking regularly for exercise) Denies hypoglycemia, polyuria, visual changes, numbness or tingling.   Past Medical History:  Diagnosis Date  . Chronic kidney disease (CKD), stage III (moderate) (HCC)   . Complete heart block ([place])    a. s/p MDT PPM 2006 with device generator replacement 2014; b. followed by Dr. Caryl Comes  . GERD (gastroesophageal reflux disease)   . Hypertension   . Hypothyroid   . Pacemaker   . Persistent atrial fibrillation    a. on Eliquis; b. CHADS2VASc 6 (HTN. age x 1, DM, TIA x 2, female); c. s/p DCCV 05/28/18  . Pulmonary hypertension ([place])    a. echo 2017: EF of 60-65%, no RWMA, mildly dilated left atrium, RVSF normal, PASP 64 mmHg  . TIA (transient ischemic attack)    Social History   Tobacco Use  . Smoking status: Former Smoker    Packs/day: 2.00    Years: 40.00    Pack years: 80.00    Types: Cigarettes    Quit date: 06/27/2005    Years since quitting: 13.5  . Smokeless tobacco: Former Systems developer  . Tobacco comment: quit Jun 27 2005  Substance Use Topics  . Alcohol use: No  . Drug use: No    Current Outpatient  Medications:  .  allopurinol (ZYLOPRIM) 100 MG tablet, Take 1 tablet (100 mg total) by mouth daily., Disp: 90 tablet, Rfl: 3 .  amiodarone (PACERONE) 200 MG tablet, Take 1/2 tablet (100 mg) by mouth twice daily, Disp: 90 tablet, Rfl: 2 .  amLODipine (NORVASC) 10 MG tablet, Take 1 tablet (10 mg total) by mouth daily., Disp: 90 tablet, Rfl: 3 .  apixaban (ELIQUIS) 5 MG TABS tablet, Take 1 tablet (5 mg total) by mouth 2 (two) times daily., Disp: 180  tablet, Rfl: 2 .  atorvastatin (LIPITOR) 20 MG tablet, TAKE 1 TABLET DAILY (Patient taking differently: 40 mg. ( takes 2- 20 mg tablets a day.)), Disp: 90 tablet, Rfl: 2 .  Baclofen 5 MG TABS, Take 5 mg by mouth at bedtime., Disp: 90 tablet, Rfl: 3 .  calcitRIOL (ROCALTROL) 0.25 MCG capsule, , Disp: , Rfl:  .  ferrous sulfate 325 (65 FE) MG EC tablet, Take 325 mg by mouth daily., Disp: , Rfl:  .  furosemide (LASIX) 20 MG tablet, TAKE 2 TABLETS BY MOUTH EVERY MORNING (Patient taking differently: Take 40 mg by mouth daily. ), Disp: 180 tablet, Rfl: 3 .  gabapentin (NEURONTIN) 300 MG capsule, Take 1 capsule (300 mg total) by mouth 2 (two) times daily., Disp: 180 capsule, Rfl: 3 .  glucose blood test strip, Check blood sugar twice daily as instructed, Disp: 100 each, Rfl: 11 .  ibandronate (BONIVA) 150 MG tablet, Take 1 tablet (150 mg total) by mouth every 30 (thirty) days. Take in the morning with a full glass of water, on an empty stomach, Disp: 3 tablet, Rfl: 3 .  levothyroxine (SYNTHROID, LEVOTHROID) 25 MCG tablet, TAKE 1 TABLET DAILY BEFORE BREAKFAST, Disp: 90 tablet, Rfl: 3 .  lisinopril (PRINIVIL,ZESTRIL) 20 MG tablet, Take 1 tablet (20 mg total) by mouth daily., Disp: 90 tablet, Rfl: 3 .  metoprolol succinate (TOPROL-XL) 100 MG 24 hr tablet, Take 1 tablet (100 mg total) by mouth daily. Take with or immediately following a meal., Disp: 90 tablet, Rfl: 3 .  omeprazole (PRILOSEC) 20 MG capsule, TAKE 1 CAPSULE BY MOUTH TWICE A DAY BEFORE A MEAL (Patient taking differently: Take 20 mg by mouth 2 (two) times daily before a meal. ), Disp: 180 capsule, Rfl: 3 .  temazepam (RESTORIL) 30 MG capsule, TAKE 1 CAPSULE BY MOUTH AT BEDTIME AS NEEDED SLEEP, Disp: 30 capsule, Rfl: 2 .  traMADol (ULTRAM) 50 MG tablet, TAKE 1 TABLET BY MOUTH TWICE DAILY, Disp: 60 tablet, Rfl: 2 .  desonide (DESOWEN) 0.05 % lotion, Apply 1 application topically 2 (two) times daily as needed. For up to 1 week max, caution if using on  face. (Patient not taking: Reported on 01/28/2019), Disp: 118 mL, Rfl: 3 .  RYBELSUS 3 MG TABS, Take 3 mg by mouth daily before breakfast., Disp: 30 tablet, Rfl: 0  Depression screen [facility] 2/9 01/28/2019 07/01/2018 03/27/2018  Decreased Interest 0 0 0  Down, Depressed, Hopeless 1 0 0  PHQ - 2 Score 1 0 0  Altered sleeping 0 - -  Tired, decreased energy 0 - -  Change in appetite 1 - -  Feeling bad or failure about yourself  0 - -  Trouble concentrating 0 - -  Moving slowly or fidgety/restless 0 - -  Suicidal thoughts 0 - -  PHQ-9 Score 2 - -  Difficult doing work/chores Not difficult at all - -    No flowsheet data found.  -------------------------------------------------------------------------- O: No physical exam performed due  to remote telephone encounter.  Lab results reviewed.  Recent Results (from the past 2160 hour(s))  T4, free     Status: None   Collection Time: 12/29/18  9:07 AM  Result Value Ref Range   Free T4 1.7 0.8 - 1.8 ng/dL  TSH     Status: None   Collection Time: 12/29/18  9:07 AM  Result Value Ref Range   TSH 1.01 0.40 - 4.50 mIU/L  Lipid panel     Status: Abnormal   Collection Time: 12/29/18  9:07 AM  Result Value Ref Range   Cholesterol 126 <200 mg/dL   HDL 42 (L) > OR = 50 mg/dL   Triglycerides 153 (H) <150 mg/dL   LDL Cholesterol (Calc) 60 mg/dL (calc)    Comment: Reference range: <100 . Desirable range <100 mg/dL for primary prevention;   <70 mg/dL for patients with CHD or diabetic patients  with > or = 2 CHD risk factors. Marland Kitchen LDL-C is now calculated using the Martin-Hopkins  calculation, which is a validated novel method providing  better accuracy than the Friedewald equation in the  estimation of LDL-C.  Cresenciano Genre et al. Annamaria Helling. 1194;174(08): 2061-2068  (http://education.[employer].com/faq/FAQ164)    Total CHOL/HDL Ratio 3.0 <5.0 (calc)   Non-HDL Cholesterol (Calc) 84 <130 mg/dL (calc)    Comment: For patients with diabetes plus 1 major  ASCVD risk  factor, treating to a non-HDL-C goal of <100 mg/dL  (LDL-C of <70 mg/dL) is considered a therapeutic  option.   COMPLETE METABOLIC PANEL WITH GFR     Status: Abnormal   Collection Time: 12/29/18  9:07 AM  Result Value Ref Range   Glucose, Bld 159 (H) 65 - 99 mg/dL    Comment: .            Fasting reference interval . For someone without known diabetes, a glucose value >125 mg/dL indicates that they may have diabetes and this should be confirmed with a follow-up test. .    BUN 34 (H) 7 - 25 mg/dL   Creat 1.78 (H) 0.50 - 0.99 mg/dL    Comment: For patients >44 years of age, the reference limit for Creatinine is approximately 13% higher for people identified as African-American. .    GFR, Est Non African American 29 (L) > OR = 60 mL/min/1.41m2   GFR, Est African American 33 (L) > OR = 60 mL/min/1.36m2   BUN/Creatinine Ratio 19 6 - 22 (calc)   Sodium 141 135 - 146 mmol/L   Potassium 4.5 3.5 - 5.3 mmol/L   Chloride 103 98 - 110 mmol/L   CO2 26 20 - 32 mmol/L   Calcium 10.5 (H) 8.6 - 10.4 mg/dL   Total Protein 7.0 6.1 - 8.1 g/dL   Albumin 4.6 3.6 - 5.1 g/dL   Globulin 2.4 1.9 - 3.7 g/dL (calc)   AG Ratio 1.9 1.0 - 2.5 (calc)   Total Bilirubin 0.3 0.2 - 1.2 mg/dL   Alkaline phosphatase (APISO) 42 37 - 153 U/L   AST 12 10 - 35 U/L   ALT 14 6 - 29 U/L  CBC with Differential/Platelet     Status: Abnormal   Collection Time: 12/29/18  9:07 AM  Result Value Ref Range   WBC 5.7 3.8 - 10.8 Thousand/uL   RBC 3.66 (L) 3.80 - 5.10 Million/uL   Hemoglobin 10.3 (L) 11.7 - 15.5 g/dL   HCT 32.6 (L) 35.0 - 45.0 %   MCV 89.1 80.0 - 100.0 fL   MCH  28.1 27.0 - 33.0 pg   MCHC 31.6 (L) 32.0 - 36.0 g/dL   RDW 15.9 (H) 11.0 - 15.0 %   Platelets 202 140 - 400 Thousand/uL   MPV 12.1 7.5 - 12.5 fL   Neutro Abs 3,625 1,500 - 7,800 cells/uL   Lymphs Abs 1,607 850 - 3,900 cells/uL   Absolute Monocytes 388 200 - 950 cells/uL   Eosinophils Absolute 51 15 - 500 cells/uL   Basophils  Absolute 29 0 - 200 cells/uL   Neutrophils Relative % 63.6 %   Total Lymphocyte 28.2 %   Monocytes Relative 6.8 %   Eosinophils Relative 0.9 %   Basophils Relative 0.5 %  Hemoglobin A1c     Status: Abnormal   Collection Time: 12/29/18  9:07 AM  Result Value Ref Range   Hgb A1c MFr Bld 7.4 (H) <5.7 % of total Hgb    Comment: For someone without known diabetes, a hemoglobin A1c value of 6.5% or greater indicates that they may have  diabetes and this should be confirmed with a follow-up  test. . For someone with known diabetes, a value <7% indicates  that their diabetes is well controlled and a value  greater than or equal to 7% indicates suboptimal  control. A1c targets should be individualized based on  duration of diabetes, age, comorbid conditions, and  other considerations. . Currently, no consensus exists regarding use of hemoglobin A1c for diagnosis of diabetes for children. .    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 - I offered Rybelsus oral GLP1, and she agrees, start new rx 3mg  daily empty stomach before breakfast 30 min before, no other meds, take thyroid medicine later, then other meds w/ or after meal - DC Janumet when start new med - Will require PA for medicare/medicaid, indication is CKD-IV progressive worsening renal function, recommended GLP1 by nephrology, failed metformin - After 3 weeks she will notify us with CBGs and request dose inc Rybelsus to 7mg  daily maintenance dose      Relevant Medications   RYBELSUS 3 MG TABS    Other Visit Diagnoses    Tinnitus of left ear    -  Primary   Relevant Orders   Ambulatory referral to ENT   Hearing loss of left ear, unspecified hearing loss type       Relevant Orders   Ambulatory referral to ENT    Gradully improving tinnitus and hearing loss, but sudden onset and severity concerning, requesting ENT evaluation  and hearing test - Referral sent, also recommended by her Cardiology   Orders Placed This Encounter  Procedures  . Ambulatory referral to ENT    Referral Priority:   Routine    Referral Type:   Consultation    Referral Reason:   Specialty Services Required    Requested Specialty:   Otolaryngology    Number of Visits Requested:   1    Meds ordered this encounter  Medications  . RYBELSUS 3 MG TABS    Sig: Take 3 mg by mouth daily before breakfast.    Dispense:  30 tablet    Refill:  0    Follow-up: - Return in 3 months as scheduled for diabetes  Patient verbalizes understanding with the above medical recommendations including the limitation of remote medical advice.  Specific follow-up and call-back criteria were given for patient to follow-up or seek medical care more urgently if needed.   - Time spent in direct consultation with patient on phone: 12 minutes  [name], [facility] 01/28/2019, 9:05 AM

## 2019-01-28 NOTE — Patient Instructions (Signed)
Stay tuned for ear specialist apt they will call you  [facility] [address]  [place],  [place] [address] Ph: [phone]  I will try to order new diabetes medicine, stay tuned. Rybelsus, one a day, empty stomach, only sip of water, then wait 30 min before first meal in morning, can take thyroid medicine before meal as well.  Stop Janumet and start new med.  Please schedule a Follow-up Appointment to: No follow-ups on file.  If you have any other questions or concerns, please feel free to call the office or send a message through [place]. You may also schedule an earlier appointment if necessary.  Additionally, you may be receiving a survey about your experience at our office within a few days to 1 week by e-mail or mail. We value your feedback.  [name], DO [place]

## 2019-01-28 NOTE — Assessment & Plan Note (Signed)
Elevated A1c 7.4, mild increase from prior, still near goal < 8 No hypoglycemia Complications - CKD-III, other including, GERD, obesity, hypothyroidism, Gout - increases risk of future cardiovascular complications  - OFF Sulfonylurea  Nephrology asked her to DC metformin, requested alternative DM Med vs Endocrinology, considered GLP1, patient declined injection. - I offered Rybelsus oral GLP1, and she agrees, start new rx 3mg  daily empty stomach before breakfast 30 min before, no other meds, take thyroid medicine later, then other meds w/ or after meal - DC Janumet when start new med - Will require PA for medicare/medicaid, indication is CKD-IV progressive worsening renal function, recommended GLP1 by nephrology, failed metformin - After 3 weeks she will notify us with CBGs and request dose inc Rybelsus to 7mg  daily maintenance dose

## 2019-01-28 NOTE — Assessment & Plan Note (Addendum)
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

## 2019-01-29 ENCOUNTER — Encounter: Payer: Self-pay | Admitting: Cardiology

## 2019-02-02 DIAGNOSIS — H903 Sensorineural hearing loss, bilateral: Secondary | ICD-10-CM | POA: Diagnosis not present

## 2019-02-08 ENCOUNTER — Telehealth: Payer: Self-pay | Admitting: Internal Medicine

## 2019-02-08 NOTE — Telephone Encounter (Signed)
°  1. Has your device fired? NO  2. Is you device beeping? NO  3. Are you experiencing draining or swelling at device site? no  4. Are you calling to see if we received your device transmission? yes  5. Have you passed out? no    Please route to [place]

## 2019-02-08 NOTE — Telephone Encounter (Signed)
Patient calling back States that she got a recording when she called the 1-800 number but it states that they are experiencing technical difficulties and that the wireless box was down Please call with any further instructions

## 2019-02-08 NOTE — Telephone Encounter (Signed)
Transmission not received. Pt to call MDT tech support at [phone].   Legrand Como [address]" [place], PA-C 02/08/2019 3:58 PM

## 2019-02-08 NOTE — Telephone Encounter (Signed)
I called the pt to let her know that we got her message about her monitor being down. I told her to just try again in the morning hopefully [employer] will have it fixed by then. Pt verbalized understanding.

## 2019-02-08 NOTE — Telephone Encounter (Signed)
Had not received transmission. Troubleshot with patient. Battery light lit up on MDT monitor indicating it needs new AA batteries. Pt will replace and re-attempt.   She has intermittent 1-2 bar service with her [employer]. Instructed to move to area of better service if needed.    Pt understands we will only call back if we do not receive transmission, or there is an issue on her transmission.    Legrand Como [address] [place], PA-C 02/08/2019 10:17 AM

## 2019-02-09 ENCOUNTER — Ambulatory Visit (INDEPENDENT_AMBULATORY_CARE_PROVIDER_SITE_OTHER): Payer: Medicare Other

## 2019-02-09 ENCOUNTER — Ambulatory Visit (INDEPENDENT_AMBULATORY_CARE_PROVIDER_SITE_OTHER): Payer: Medicare Other | Admitting: *Deleted

## 2019-02-09 DIAGNOSIS — I442 Atrioventricular block, complete: Secondary | ICD-10-CM

## 2019-02-09 DIAGNOSIS — Z0000 Encounter for general adult medical examination without abnormal findings: Secondary | ICD-10-CM | POA: Diagnosis not present

## 2019-02-09 LAB — CUP PACEART REMOTE DEVICE CHECK
Battery Remaining Longevity: 35 mo
Battery Voltage: 2.96 V
Brady Statistic AP VP Percent: 96.66 %
Brady Statistic AP VS Percent: 0.39 %
Brady Statistic AS VP Percent: 2.89 %
Brady Statistic AS VS Percent: 0.06 %
Brady Statistic RA Percent Paced: 96.55 %
Brady Statistic RV Percent Paced: 99.29 %
Date Time Interrogation Session: 20200707145312
Implantable Lead Implant Date: 20141217
Implantable Lead Implant Date: 20141217
Implantable Lead Location: 753859
Implantable Lead Location: 753860
Implantable Lead Model: 5076
Implantable Lead Model: 5076
Implantable Pulse Generator Implant Date: 20141217
Lead Channel Impedance Value: 399 Ohm
Lead Channel Impedance Value: 399 Ohm
Lead Channel Impedance Value: 513 Ohm
Lead Channel Impedance Value: 513 Ohm
Lead Channel Pacing Threshold Amplitude: 0.375 V
Lead Channel Pacing Threshold Amplitude: 0.625 V
Lead Channel Pacing Threshold Pulse Width: 0.4 ms
Lead Channel Pacing Threshold Pulse Width: 0.4 ms
Lead Channel Sensing Intrinsic Amplitude: 10.875 mV
Lead Channel Sensing Intrinsic Amplitude: 10.875 mV
Lead Channel Sensing Intrinsic Amplitude: 4.5 mV
Lead Channel Sensing Intrinsic Amplitude: 4.5 mV
Lead Channel Setting Pacing Amplitude: 2 V
Lead Channel Setting Pacing Amplitude: 2.5 V
Lead Channel Setting Pacing Pulse Width: 0.4 ms
Lead Channel Setting Sensing Sensitivity: 0.9 mV

## 2019-02-09 NOTE — Progress Notes (Signed)
Subjective:   AVIANNA MOYNAHAN is a 68 y.o. female who presents for Medicare Annual (Subsequent) preventive examination.  This visit is being conducted via phone call  - after an attmept to do on video chat - due to the COVID-19 pandemic. This patient has given me verbal consent via phone to conduct this visit, patient states they are participating from their home address. Some vital signs may be absent or patient reported.   Patient identification: identified by name, DOB, and current address.    Review of Systems:   Cardiac Risk Factors include: diabetes mellitus;dyslipidemia;hypertension;advanced age (>70men, >72 women)     Objective:     Vitals: There were no vitals taken for this visit.  There is no height or weight on file to calculate BMI.  Advanced Directives 02/09/2019 05/28/2018 09/01/2017 12/10/2016 02/07/2016 02/07/2016  Does Patient Have a Medical Advance Directive? Yes Yes Yes No No No  Type of Advance Directive Living will;Healthcare Power of [place];Living will Living will;Healthcare Power of Attorney - - -  Does patient want to make changes to medical advance directive? - No - Patient declined No - Patient declined - - -  Copy of [place] in Chart? Yes - validated most recent copy scanned in chart (See row information) No - copy requested - - - -  Would patient like information on creating a medical advance directive? - - - No - Patient declined - -    Tobacco Social History   Tobacco Use  Smoking Status Former Smoker  . Packs/day: 2.00  . Years: 40.00  . Pack years: 80.00  . Types: Cigarettes  . Quit date: 06/27/2005  . Years since quitting: 13.6  Smokeless Tobacco Never Used  Tobacco Comment   quit Jun 27 2005     Counseling given: Not Answered Comment: quit Jun 27 2005   Clinical Intake:  Pre-visit preparation completed: Yes  Pain : No/denies pain     Nutritional Risks: None Diabetes: Yes CBG done?:  No Did pt. 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  No  Does the patient have any non-healing wounds?  No  Has the patient had any unintentional weight loss or weight gain?  No   Diabetes:  Is the patient diabetic?  Yes  If diabetic, was a CBG obtained today?  No  Did the patient bring in their glucometer from home?  No  How often do you monitor your CBG's? 1-2 times a day.   Financial Strains and Diabetes Management:  Are you having any financial strains with the device, your supplies or your medication? No .  Does the patient want to be seen by Chronic Care Management for management of their diabetes?  No  Would the patient like to be referred to a Nutritionist or for Diabetic Management?  No   Diabetic Exams:  Diabetic Eye Exam: Completed 01/11/2019.   Diabetic Foot Exam: due, will be completed at next in office visit.    Interpreter Needed?: No  Information entered by :: Darrion Wyszynski,LPN  Past Medical History:  Diagnosis Date  . Chronic kidney disease (CKD), stage III (moderate) (HCC)   .  Complete heart block ([place])    a. s/p MDT PPM 2006 with device generator replacement 2014; b. followed by Dr. Caryl Comes  . GERD (gastroesophageal reflux disease)   . Hypertension   . Hypothyroid   . Pacemaker   . Persistent atrial fibrillation    a. on Eliquis; b. CHADS2VASc 6 (HTN. age x 1, DM, TIA x 2, female); c. s/p DCCV 05/28/18  . Pulmonary hypertension ([place])    a. echo 2017: EF of 60-65%, no RWMA, mildly dilated left atrium, RVSF normal, PASP 64 mmHg  . TIA (transient ischemic attack)    Past Surgical History:  Procedure Laterality Date  . ABDOMINAL HYSTERECTOMY    .  CARDIOVERSION N/A 05/28/2018   Procedure: CARDIOVERSION;  Surgeon: Minna Merritts, MD;  Location: [facility] ORS;  Service: Cardiovascular;  Laterality: N/A;  . CHOLECYSTECTOMY    . TONSILLECTOMY    . TUBAL LIGATION     Family History  Problem Relation Age of Onset  . Hypertension Other   . Heart disease Mother        MI  . Alcohol abuse Father   . Autism Son   . Heart disease Maternal Grandmother   . Cancer Son        colon  . Miscarriages / Stillbirths Maternal Aunt    Social History   Socioeconomic History  . Marital status: Divorced    Spouse name: Not on file  . Number of children: Not on file  . Years of education: Not on file  . Highest education level: High school graduate  Occupational History  . Occupation: retired  Scientific laboratory technician  . Financial resource strain: Not hard at all  . Food insecurity    Worry: Never true    Inability: Never true  . Transportation needs    Medical: No    Non-medical: No  Tobacco Use  . Smoking status: Former Smoker    Packs/day: 2.00    Years: 40.00    Pack years: 80.00    Types: Cigarettes    Quit date: 06/27/2005    Years since quitting: 13.6  . Smokeless tobacco: Never Used  . Tobacco comment: quit Jun 27 2005  Substance and Sexual Activity  . Alcohol use: No  . Drug use: No  . Sexual activity: Never  Lifestyle  . Physical activity    Days per week: Not on file    Minutes per session: Not on file  . Stress: Not at all  Relationships  . Social connections    Talks on phone: More than three times a week    Gets together: More than three times a week    Attends religious service: More than 4 times per year    Active member of club or organization: Yes    Attends meetings of clubs or organizations: More than 4 times per year    Relationship status: Divorced  Other Topics Concern  . Not on file  Social History Narrative  . Not on file    Outpatient Encounter Medications as of 02/09/2019  Medication Sig  . allopurinol  (ZYLOPRIM) 100 MG tablet Take 1 tablet (100 mg total) by mouth daily.  Marland Kitchen amiodarone (PACERONE) 200 MG tablet Take 1/2 tablet (100 mg) by mouth twice daily  . amLODipine (NORVASC) 10 MG tablet Take 1 tablet (10 mg total) by mouth daily.  Marland Kitchen apixaban (ELIQUIS) 5 MG TABS tablet Take 1 tablet (5 mg total) by mouth 2 (two) times daily.  Marland Kitchen atorvastatin (LIPITOR) 20  MG tablet TAKE 1 TABLET DAILY (Patient taking differently: 40 mg. ( takes 2- 20 mg tablets a day.))  . Baclofen 5 MG TABS Take 5 mg by mouth at bedtime.  . calcitRIOL (ROCALTROL) 0.25 MCG capsule   . ferrous sulfate 325 (65 FE) MG EC tablet Take 325 mg by mouth daily.  . furosemide (LASIX) 20 MG tablet TAKE 2 TABLETS BY MOUTH EVERY MORNING (Patient taking differently: Take 40 mg by mouth daily. )  . gabapentin (NEURONTIN) 300 MG capsule Take 1 capsule (300 mg total) by mouth 2 (two) times daily.  Marland Kitchen glucose blood test strip Check blood sugar twice daily as instructed  . ibandronate (BONIVA) 150 MG tablet Take 1 tablet (150 mg total) by mouth every 30 (thirty) days. Take in the morning with a full glass of water, on an empty stomach  . levothyroxine (SYNTHROID, LEVOTHROID) 25 MCG tablet TAKE 1 TABLET DAILY BEFORE BREAKFAST  . lisinopril (PRINIVIL,ZESTRIL) 20 MG tablet Take 1 tablet (20 mg total) by mouth daily.  . metoprolol succinate (TOPROL-XL) 100 MG 24 hr tablet Take 1 tablet (100 mg total) by mouth daily. Take with or immediately following a meal.  . omeprazole (PRILOSEC) 20 MG capsule TAKE 1 CAPSULE BY MOUTH TWICE A DAY BEFORE A MEAL (Patient taking differently: Take 20 mg by mouth 2 (two) times daily before a meal. )  . RYBELSUS 3 MG TABS Take 3 mg by mouth daily before breakfast.  . temazepam (RESTORIL) 30 MG capsule TAKE 1 CAPSULE BY MOUTH AT BEDTIME AS NEEDED SLEEP  . traMADol (ULTRAM) 50 MG tablet TAKE 1 TABLET BY MOUTH TWICE DAILY  . desonide (DESOWEN) 0.05 % lotion Apply 1 application topically 2 (two) times daily as needed. For up  to 1 week max, caution if using on face. (Patient not taking: Reported on 02/09/2019)   No facility-administered encounter medications on file as of 02/09/2019.     Activities of Daily Living In your present state of health, do you have any difficulty performing the following activities: 02/09/2019 05/28/2018  Hearing? Y N  Comment tinnitus -  Vision? N Y  Difficulty concentrating or making decisions? N N  Walking or climbing stairs? N Y  Dressing or bathing? N N  Doing errands, shopping? N -  Preparing Food and eating ? N -  Using the Toilet? N -  In the past six months, have you accidently leaked urine? N -  Do you have problems with loss of bowel control? N -  Managing your Medications? N -  Managing your Finances? N -  Housekeeping or managing your Housekeeping? N -  Some recent data might be hidden    Patient Care Team: Olin Hauser, DO as PCP - General (Family Medicine) Lavonia Dana, MD as Consulting Physician (Nephrology) Deboraha Sprang, MD as Consulting Physician (Cardiology) Edrick Kins, DPM as Consulting Physician (Podiatry)    Assessment:   This is a routine wellness examination for Sharan.  Exercise Activities and Dietary recommendations Current Exercise Habits: The patient does not participate in regular exercise at present, Exercise limited by: None identified  Goals    . DIET - INCREASE WATER INTAKE     Recommend drinking at least 6- 8 glasses of water a day        Fall Risk: Fall Risk  02/09/2019 07/01/2018 03/27/2018 12/19/2017 11/21/2017  Falls in the past year? 0 0 No No No  Comment - - - - -  Number falls in past yr: - - - - -  Comment - - - - -  Injury with Fall? - - - - -  Follow up - Falls evaluation completed - - -    FALL RISK PREVENTION PERTAINING TO THE HOME:  Any stairs in or around the home? No  If so, are there any without handrails? n/a  Home free of loose throw rugs in walkways, pet beds, electrical cords, etc? Yes   Adequate lighting in your home to reduce risk of falls? Yes   ASSISTIVE DEVICES UTILIZED TO PREVENT FALLS:  Life alert? No  Use of a cane, walker or w/c? Yes  cane as needed Grab bars in the bathroom? No  Shower chair or bench in shower? No  Elevated toilet seat or a handicapped toilet? No   TIMED UP AND GO:  Unable to perform    Depression Screen PHQ 2/9 Scores 02/09/2019 01/28/2019 07/01/2018 03/27/2018  PHQ - 2 Score 1 1 0 0  PHQ- 9 Score - 2 - -     Cognitive Function     6CIT Screen 02/09/2019  What Year? 0 points  What month? 0 points  What time? 0 points  Count back from 20 0 points  Months in reverse 0 points  Repeat phrase 0 points  Total Score 0    Immunization History  Administered Date(s) Administered  . Pneumococcal Conjugate-13 09/10/2016  . Pneumococcal Polysaccharide-23 09/16/2017  . Tdap 04/06/2015    Qualifies for Shingles Vaccine? Yes  Zostavax completed n/a. Due for Shingrix. Education has been provided regarding the importance of this vaccine. Pt has been advised to call insurance company to determine out of pocket expense. Advised may also receive vaccine at local pharmacy or Health Dept. Verbalized acceptance and understanding.  Tdap: up to date   Flu Vaccine: up to date   Pneumococcal Vaccine: up to date    Screening Tests Health Maintenance  Topic Date Due  . FOOT EXAM  12/20/2018  . MAMMOGRAM  07/02/2019 (Originally 04/11/1999)  . INFLUENZA VACCINE  03/06/2019  . HEMOGLOBIN A1C  07/01/2019  . OPHTHALMOLOGY EXAM  01/11/2020  . COLONOSCOPY  07/05/2024  . TETANUS/TDAP  04/05/2025  . DEXA SCAN  Completed  . Hepatitis C Screening  Completed  . PNA vac Low Risk Adult  Completed    Cancer Screenings:  Colorectal Screening: Completed 07/05/2014. Repeat every 10 years  Mammogram: declined  Bone Density: Completed 01/13/2017.   Lung Cancer Screening: (Low Dose CT Chest recommended if Age 43-80 years, 30 pack-year currently smoking OR have  quit w/in 15years.) does qualify. Declined    Additional Screening:  Hepatitis C Screening: does qualify; Completed 09/10/2016  Dental Screening: Recommended annual dental exams for proper oral hygiene   Community Resource Referral:  CRR required this visit?  No       Plan:  I have personally reviewed and addressed the Medicare Annual Wellness questionnaire and have noted the following in the patient's chart:  A. Medical and social history B. Use of alcohol, tobacco or illicit drugs  C. Current medications and supplements D. Functional ability and status E.  Nutritional status F.  Physical activity G. Advance directives H. List of other physicians I.  Hospitalizations, surgeries, and ER visits in previous 12 months J.  [place] such as hearing and vision if needed, cognitive and depression L. Referrals and appointments   In addition, I have reviewed and discussed with patient certain preventive protocols, quality metrics, and best practice recommendations. A written personalized care plan for preventive services  as well as general preventive health recommendations were provided to patient.   Signed,    Bevelyn Ngo, LPN  08/08/1028 Nurse Health Advisor   Nurse Notes: none

## 2019-02-09 NOTE — Telephone Encounter (Signed)
Added to schedule for processing.

## 2019-02-09 NOTE — Telephone Encounter (Signed)
Transmission received 02-09-2019

## 2019-02-09 NOTE — Patient Instructions (Addendum)
Renee Boyd , Thank you for taking time to come for your Medicare Wellness Visit. I appreciate your ongoing commitment to your health goals. Please review the following plan we discussed and let me know if I can assist you in the future.   Screening recommendations/referrals: Colonoscopy: completed 07/05/2014 Mammogram: declined Bone Density: completed 01/13/2017 Recommended yearly ophthalmology/optometry visit for glaucoma screening and checkup Recommended yearly dental visit for hygiene and checkup  Vaccinations: Influenza vaccine: declined Pneumococcal vaccine: completed series  Tdap vaccine: up to date  Shingles vaccine: shingrix eligible, check with your insurance company for coverage   Advanced directives: copy on file   Conditions/risks identified: diabetic, discussed chronic care management team.   Next appointment: Follow up in one year for your annual wellness exam.    Preventive Care 70 Years and Older, Female Preventive care refers to lifestyle choices and visits with your health care provider that can promote health and wellness. What does preventive care include?  A yearly physical exam. This is also called an annual well check.  Dental exams once or twice a year.  Routine eye exams. Ask your health care provider how often you should have your eyes checked.  Personal lifestyle choices, including:  Daily care of your teeth and gums.  Regular physical activity.  Eating a healthy diet.  Avoiding tobacco and drug use.  Limiting alcohol use.  Practicing safe sex.  Taking low-dose aspirin every day.  Taking vitamin and mineral supplements as recommended by your health care provider. What happens during an annual well check? The services and screenings done by your health care provider during your annual well check will depend on your age, overall health, lifestyle risk factors, and family history of disease. Counseling  Your health care provider may ask you  questions about your:  Alcohol use.  Tobacco use.  Drug use.  Emotional well-being.  Home and relationship well-being.  Sexual activity.  Eating habits.  History of falls.  Memory and ability to understand (cognition).  Work and work Statistician.  Reproductive health. Screening  You may have the following tests or measurements:  Height, weight, and BMI.  Blood pressure.  Lipid and cholesterol levels. These may be checked every 5 years, or more frequently if you are over 5 years old.  Skin check.  Lung cancer screening. You may have this screening every year starting at age 70 if you have a 30-pack-year history of smoking and currently smoke or have quit within the past 15 years.  Fecal occult blood test (FOBT) of the stool. You may have this test every year starting at age 70.  Flexible sigmoidoscopy or colonoscopy. You may have a sigmoidoscopy every 5 years or a colonoscopy every 10 years starting at age 70.  Hepatitis C blood test.  Hepatitis B blood test.  Sexually transmitted disease (STD) testing.  Diabetes screening. This is done by checking your blood sugar (glucose) after you have not eaten for a while (fasting). You may have this done every 1-3 years.  Bone density scan. This is done to screen for osteoporosis. You may have this done starting at age 70.  Mammogram. This may be done every 1-2 years. Talk to your health care provider about how often you should have regular mammograms. Talk with your health care provider about your test results, treatment options, and if necessary, the need for more tests. Vaccines  Your health care provider may recommend certain vaccines, such as:  Influenza vaccine. This is recommended every year.  Tetanus,  diphtheria, and acellular pertussis (Tdap, Td) vaccine. You may need a Td booster every 10 years.  Zoster vaccine. You may need this after age 70.  Pneumococcal 13-valent conjugate (PCV13) vaccine. One dose is  recommended after age 70.  Pneumococcal polysaccharide (PPSV23) vaccine. One dose is recommended after age 70. Talk to your health care provider about which screenings and vaccines you need and how often you need them. This information is not intended to replace advice given to you by your health care provider. Make sure you discuss any questions you have with your health care provider. Document Released: 08/18/2015 Document Revised: 04/10/2016 Document Reviewed: 05/23/2015 Elsevier Interactive Patient Education  2017 [place] Prevention in the Home Falls can cause injuries. They can happen to people of all ages. There are many things you can do to make your home safe and to help prevent falls. What can I do on the outside of my home?  Regularly fix the edges of walkways and driveways and fix any cracks.  Remove anything that might make you trip as you walk through a door, such as a raised step or threshold.  Trim any bushes or trees on the path to your home.  Use bright outdoor lighting.  Clear any walking paths of anything that might make someone trip, such as rocks or tools.  Regularly check to see if handrails are loose or broken. Make sure that both sides of any steps have handrails.  Any raised decks and porches should have guardrails on the edges.  Have any leaves, snow, or ice cleared regularly.  Use sand or salt on walking paths during winter.  Clean up any spills in your garage right away. This includes oil or grease spills. What can I do in the bathroom?  Use night lights.  Install grab bars by the toilet and in the tub and shower. Do not use towel bars as grab bars.  Use non-skid mats or decals in the tub or shower.  If you need to sit down in the shower, use a plastic, non-slip stool.  Keep the floor dry. Clean up any water that spills on the floor as soon as it happens.  Remove soap buildup in the tub or shower regularly.  Attach bath mats  securely with double-sided non-slip rug tape.  Do not have throw rugs and other things on the floor that can make you trip. What can I do in the bedroom?  Use night lights.  Make sure that you have a light by your bed that is easy to reach.  Do not use any sheets or blankets that are too big for your bed. They should not hang down onto the floor.  Have a firm chair that has side arms. You can use this for support while you get dressed.  Do not have throw rugs and other things on the floor that can make you trip. What can I do in the kitchen?  Clean up any spills right away.  Avoid walking on wet floors.  Keep items that you use a lot in easy-to-reach places.  If you need to reach something above you, use a strong step stool that has a grab bar.  Keep electrical cords out of the way.  Do not use floor polish or wax that makes floors slippery. If you must use wax, use non-skid floor wax.  Do not have throw rugs and other things on the floor that can make you trip. What can I do with  my stairs?  Do not leave any items on the stairs.  Make sure that there are handrails on both sides of the stairs and use them. Fix handrails that are broken or loose. Make sure that handrails are as long as the stairways.  Check any carpeting to make sure that it is firmly attached to the stairs. Fix any carpet that is loose or worn.  Avoid having throw rugs at the top or bottom of the stairs. If you do have throw rugs, attach them to the floor with carpet tape.  Make sure that you have a light switch at the top of the stairs and the bottom of the stairs. If you do not have them, ask someone to add them for you. What else can I do to help prevent falls?  Wear shoes that:  Do not have high heels.  Have rubber bottoms.  Are comfortable and fit you well.  Are closed at the toe. Do not wear sandals.  If you use a stepladder:  Make sure that it is fully opened. Do not climb a closed  stepladder.  Make sure that both sides of the stepladder are locked into place.  Ask someone to hold it for you, if possible.  Clearly mark and make sure that you can see:  Any grab bars or handrails.  First and last steps.  Where the edge of each step is.  Use tools that help you move around (mobility aids) if they are needed. These include:  Canes.  Walkers.  Scooters.  Crutches.  Turn on the lights when you go into a dark area. Replace any light bulbs as soon as they burn out.  Set up your furniture so you have a clear path. Avoid moving your furniture around.  If any of your floors are uneven, fix them.  If there are any pets around you, be aware of where they are.  Review your medicines with your doctor. Some medicines can make you feel dizzy. This can increase your chance of falling. Ask your doctor what other things that you can do to help prevent falls. This information is not intended to replace advice given to you by your health care provider. Make sure you discuss any questions you have with your health care provider. Document Released: 05/18/2009 Document Revised: 12/28/2015 Document Reviewed: 08/26/2014 Elsevier Interactive Patient Education  2017 [employer].

## 2019-02-19 ENCOUNTER — Encounter: Payer: Self-pay | Admitting: Cardiology

## 2019-02-19 NOTE — Progress Notes (Signed)
Remote pacemaker transmission.   

## 2019-02-21 ENCOUNTER — Other Ambulatory Visit: Payer: Self-pay | Admitting: Family Medicine

## 2019-02-21 DIAGNOSIS — K219 Gastro-esophageal reflux disease without esophagitis: Secondary | ICD-10-CM

## 2019-02-21 DIAGNOSIS — M810 Age-related osteoporosis without current pathological fracture: Secondary | ICD-10-CM

## 2019-03-03 ENCOUNTER — Telehealth: Payer: Self-pay

## 2019-03-03 DIAGNOSIS — E1121 Type 2 diabetes mellitus with diabetic nephropathy: Secondary | ICD-10-CM

## 2019-03-03 NOTE — Telephone Encounter (Signed)
Patient called stating that the Rybelsus is not working and wanted to let Dr. Raliegh Ip know.  Please advise

## 2019-03-03 NOTE — Telephone Encounter (Signed)
Please contact patient to find out more information.  What is not working about Rybelsus?  If it is not strong enough to control her sugars - I did advise her that the dose needs to be increased from 3mg  after 1 month up to 7mg , and then possibly again in the future to 14mg .  She would need a new rx.  Let me know. If she has a lot of questions, or wants to discuss in more detail with me, she can schedule a virtual visit for blood sugar review.  If this medicine is ineffective - we may need to consider referral to Endocrinology Diabetes specialist. Her kidney doctor asked about this as well.  [name], DO [facility] 03/03/2019, 4:59 PM

## 2019-03-04 ENCOUNTER — Other Ambulatory Visit: Payer: Self-pay | Admitting: Internal Medicine

## 2019-03-04 MED ORDER — APIXABAN 5 MG PO TABS: 5.0000 mg | ORAL_TABLET | Freq: Two times a day (BID) | ORAL | 2 refills | Status: DC

## 2019-03-04 MED ORDER — RYBELSUS 7 MG PO TABS: 7.0000 mg | ORAL_TABLET | Freq: Every day | ORAL | 2 refills | Status: DC

## 2019-03-04 NOTE — Telephone Encounter (Signed)
Refill Request.  

## 2019-03-04 NOTE — Telephone Encounter (Signed)
As per patient Rybelsus is not that much effective it was in the beginning but she is also not sure because she is taking antibiotics for her oral procedures. Her sugar level stays in 120-177 mg/dl and gets 190 to 195 mg/dl. She wants to go up to 7 mg as per your recommendation and Rx send to [facility].

## 2019-03-04 NOTE — Telephone Encounter (Signed)
°*  STAT* If patient is at the pharmacy, call can be transferred to refill team.   1. Which medications need to be refilled? (please list name of each medication and dose if known) Eliquis 5 MG 2 a day   2. Which pharmacy/location (including street and city if local pharmacy) is medication to be sent to? [facility]   3. Do they need a 30 day or 90 day supply? 90 day

## 2019-03-04 NOTE — Telephone Encounter (Signed)
Eliquis 5mg  refill request received; pt is 68 yrs old, wt-78.5kg, Crea-1.78 on 12/29/2018, last Telemedicine visit by Dr. Caryl Comes on 01/26/2019, Diagnosis Afib; will send in refill to requested pharmacy.

## 2019-03-04 NOTE — Telephone Encounter (Signed)
It may be reduced due to antibiotics. Or if she is taking other pills or meal too soon.  Otherwise, she probably just needs to increase dose up to 7 then maybe 14 in future.  New rx sent to [facility] now for 7mg   [name], [facility] 03/04/2019, 12:47 PM

## 2019-03-08 ENCOUNTER — Other Ambulatory Visit: Payer: Self-pay | Admitting: Internal Medicine

## 2019-03-18 ENCOUNTER — Other Ambulatory Visit: Payer: Self-pay | Admitting: Family Medicine

## 2019-03-18 DIAGNOSIS — M25552 Pain in left hip: Secondary | ICD-10-CM

## 2019-03-18 DIAGNOSIS — G8929 Other chronic pain: Secondary | ICD-10-CM

## 2019-03-26 ENCOUNTER — Other Ambulatory Visit: Payer: Self-pay | Admitting: Internal Medicine

## 2019-03-29 ENCOUNTER — Telehealth: Payer: Self-pay

## 2019-03-29 DIAGNOSIS — E1121 Type 2 diabetes mellitus with diabetic nephropathy: Secondary | ICD-10-CM

## 2019-03-29 NOTE — Telephone Encounter (Signed)
At this point I would recommend an Endocrinology specialist - Diabetic doctor. Her kidney Dr Juleen China has recommended this before. There is one more dose of Rylbesus up to 14 mg every day, however I would still prefer for her to get second opinion from Endocrinology as next step.  Called patient. She mentioned a few dietary issues of not always adhering to diet but thinks this is rare occurrence.  She agrees to referral to endocrine diabetes management. Placed to [facility] clinic  [name], [facility] 03/29/2019, 11:53 AM

## 2019-03-29 NOTE — Telephone Encounter (Signed)
As per patient Renee Boyd is not that much effective and her fasting blood sugar is still in 185-190 mg/dl range even she has increased to 7 mg from 3 mg. Please suggest?

## 2019-04-02 ENCOUNTER — Telehealth: Payer: Self-pay | Admitting: Family Medicine

## 2019-04-02 DIAGNOSIS — E1121 Type 2 diabetes mellitus with diabetic nephropathy: Secondary | ICD-10-CM

## 2019-04-02 MED ORDER — RYBELSUS 14 MG PO TABS: 14.0000 mg | ORAL_TABLET | Freq: Every day | ORAL | 1 refills | Status: DC

## 2019-04-02 NOTE — Telephone Encounter (Signed)
Dr appt for diabetes at [facility] appt Sept 30, Pt wanted to know what too do for mediation until then. Please advise pt.

## 2019-04-02 NOTE — Telephone Encounter (Signed)
Sent new rx Rybelsus 14mg . Same med but increased dose, this is highest dose it comes in. She can try this until she sees endocrinology on 05/05/19.  [name], DO [facility] 04/02/2019, 12:55 PM

## 2019-04-02 NOTE — Telephone Encounter (Signed)
VM not set spoke to Son and advised him to tell mother to call the office.

## 2019-04-05 NOTE — Telephone Encounter (Addendum)
Patient advised.

## 2019-04-19 ENCOUNTER — Other Ambulatory Visit: Payer: Self-pay | Admitting: Family Medicine

## 2019-04-19 DIAGNOSIS — R252 Cramp and spasm: Secondary | ICD-10-CM

## 2019-04-19 DIAGNOSIS — F5101 Primary insomnia: Secondary | ICD-10-CM

## 2019-04-21 ENCOUNTER — Other Ambulatory Visit: Payer: Self-pay | Admitting: Family Medicine

## 2019-04-21 ENCOUNTER — Other Ambulatory Visit: Payer: Self-pay | Admitting: Internal Medicine

## 2019-04-21 DIAGNOSIS — E790 Hyperuricemia without signs of inflammatory arthritis and tophaceous disease: Secondary | ICD-10-CM

## 2019-04-22 DIAGNOSIS — N184 Chronic kidney disease, stage 4 (severe): Secondary | ICD-10-CM | POA: Diagnosis not present

## 2019-04-22 DIAGNOSIS — E1122 Type 2 diabetes mellitus with diabetic chronic kidney disease: Secondary | ICD-10-CM | POA: Diagnosis not present

## 2019-04-22 DIAGNOSIS — E875 Hyperkalemia: Secondary | ICD-10-CM | POA: Diagnosis not present

## 2019-04-22 DIAGNOSIS — I129 Hypertensive chronic kidney disease with stage 1 through stage 4 chronic kidney disease, or unspecified chronic kidney disease: Secondary | ICD-10-CM | POA: Diagnosis not present

## 2019-04-28 DIAGNOSIS — E875 Hyperkalemia: Secondary | ICD-10-CM | POA: Diagnosis not present

## 2019-04-28 DIAGNOSIS — N184 Chronic kidney disease, stage 4 (severe): Secondary | ICD-10-CM | POA: Diagnosis not present

## 2019-04-28 DIAGNOSIS — N2581 Secondary hyperparathyroidism of renal origin: Secondary | ICD-10-CM | POA: Diagnosis not present

## 2019-04-28 DIAGNOSIS — I129 Hypertensive chronic kidney disease with stage 1 through stage 4 chronic kidney disease, or unspecified chronic kidney disease: Secondary | ICD-10-CM | POA: Insufficient documentation

## 2019-04-28 DIAGNOSIS — R809 Proteinuria, unspecified: Secondary | ICD-10-CM | POA: Insufficient documentation

## 2019-04-28 DIAGNOSIS — E1122 Type 2 diabetes mellitus with diabetic chronic kidney disease: Secondary | ICD-10-CM | POA: Diagnosis not present

## 2019-05-04 ENCOUNTER — Ambulatory Visit (INDEPENDENT_AMBULATORY_CARE_PROVIDER_SITE_OTHER): Payer: Medicare Other | Admitting: Family Medicine

## 2019-05-04 ENCOUNTER — Encounter: Payer: Self-pay | Admitting: Family Medicine

## 2019-05-04 ENCOUNTER — Other Ambulatory Visit: Payer: Self-pay

## 2019-05-04 VITALS — BP 144/80 | HR 67 | Resp 15 | Ht 59.0 in | Wt 169.2 lb

## 2019-05-04 DIAGNOSIS — E1121 Type 2 diabetes mellitus with diabetic nephropathy: Secondary | ICD-10-CM | POA: Diagnosis not present

## 2019-05-04 DIAGNOSIS — F5101 Primary insomnia: Secondary | ICD-10-CM

## 2019-05-04 DIAGNOSIS — N184 Chronic kidney disease, stage 4 (severe): Secondary | ICD-10-CM

## 2019-05-04 LAB — POCT GLYCOSYLATED HEMOGLOBIN (HGB A1C): Hemoglobin A1C: 8.2 % — AB (ref 4.0–5.6)

## 2019-05-04 NOTE — Patient Instructions (Addendum)
Thank you for coming to the office today.  Follow up with Diabetes specialist tomorrow as scheduled. To discuss next options in treatment.  A1c 8.2  Keep improving diet and lifestyle as discussed.  Hopefully a new treatment option can help control your sugar.  Let me know if we need to change sleeping medicine. Cannot stop it cold [place] will need to taper off of it.  Sleep Hygiene Recommendations to promote healthy sleep in all patients, especially if symptoms of insomnia are worsening. Due to the nature of sleep rhythms, if your body gets "out of rhythm", it may take some time before your sleep cycle can be "reset".  Please try to follow as many of the following tips as you can, usually there are only a few of these are the primary cause of the problem.  ?To reset your sleep rhythm, go to bed and get up at the same time every day ?Sleep only long enough to feel rested and then get out of bed ?Do not try to force yourself to sleep. If you can't sleep, get out of bed and try again later. ?Avoid naps during the day, unless excessively tired. The more sleeping during the day, then the less sleep your body needs at night.  ?Have coffee, tea, and other foods that have caffeine only in the morning ?Exercise several days a week, but not right before bed ?If you drink alcohol, prefer to have appropriate drink with one meal, but prefer to avoid alcohol in the evening, and bedtime ?If you smoke, avoid smoking, especially in the evening  ?Avoid watching TV or looking at phones, computers, or reading devices ("e-books") that give off light at least 30 minutes before bed. This artificial light sends "awake signals" to your brain and can make it harder to fall asleep. ?Make your bedroom a comfortable place where it is easy to fall asleep: ? Put up shades or special blackout curtains to block light from outside. ? Use a white noise machine to block noise. ? Keep the temperature cool. ?Try your best  to solve or at least address your problems before you go to bed ?Use relaxation techniques to manage stress. Ask your health care provider to suggest some techniques that may work well for you. These may include: ? Breathing exercises. ? Routines to release muscle tension. ? Visualizing peaceful scenes.    Please schedule a Follow-up Appointment to: Return in about 3 months (around 08/03/2019) for Follow-up 3-4 months DM / CKD.  If you have any other questions or concerns, please feel free to call the office or send a message through [place]. You may also schedule an earlier appointment if necessary.  Additionally, you may be receiving a survey about your experience at our office within a few days to 1 week by e-mail or mail. We value your feedback.  [name], DO [place]

## 2019-05-04 NOTE — Assessment & Plan Note (Signed)
Chronic problem Stable for years, now lately some episodic worsening, seems Temazepam may be less effective - She has tried other meds in past Ambien, Ambien CR, SSRi - Reconsider options - consider Trazodone in future if ready would taper off med Follow-up in future if ready to switch meds

## 2019-05-04 NOTE — Assessment & Plan Note (Signed)
Elevated A1c 8.2 from prior 7.4, only mild increase, still goal is 7-8 range, prefer tighter control in setting CKD IV No hypoglycemia Complications - CKD-III, other including, GERD, obesity, hypothyroidism, Gout - increases risk of future cardiovascular complications  - OFF Sulfonylurea, Metformin due to CKD  Followed by Dr Juleen China [facility] - cannot resume Metformin due to GFR  Plan - already on Rybelsus oral GLP1 14mg  daily - max dose, now she can discuss with [facility] Endocrinology has apt tomorrow with them for consultation initial - can discuss other treatment options, she has declined injectable GLP1 in past, that may improve her stability glucose control but other options are available to her

## 2019-05-04 NOTE — Assessment & Plan Note (Addendum)
Stable CKD-IV per Dr Juleen China [facility] Last visit 04/2019 Reviewed report Secondary to DM, HTN, Age Cannot use Metformin due to GFR - on Rybelsus - will defer to [facility] Endocrinology now on DM regimen currently

## 2019-05-04 NOTE — Progress Notes (Signed)
Subjective:    Patient ID: Renee Boyd, female    DOB: 1950/11/24, 68 y.o.   MRN: [id number]  Renee Boyd is a 68 y.o. female presenting on 05/04/2019 for Diabetes and Hypertension   HPI   CHRONIC DM, Type 2with CKD-IV Previous discussions on diabetes, most recent in past 3-4 months, with management on oral GLP1 agent due to decline in kidney function GFR < 30 needed to come off Metformin. She was on janumet previous. - Followed by Dr Kathryne Eriksson, last apt 04/28/19, see report - Today she reports overall even highest dose Rybelsus 14mg  daily empty stomach does not seem effective, overall her A1c is at 8.2 today but she says has labile or fluctuating sugar. - She admits used to be able to tolerate eating more and even some sweets and her sugar would maintain but now it is more labile. Now she can't even eat 2 sugar free cookies - CBG ranging from 150-180 and high as 200s+ - High sugar can make her nauseas - She has apt with [facility] Endocrinology tomorrow on 9/30 - off Glimepiride, Janumet / Metformin Currently on ACEi- per renal Lifestyle: - Diet (improved diet) - Exercise (walking regularly for exercise) Denies hypoglycemia, polyuria, visual changes, numbness or tingling.  Additional concern Thought gabapentin was making her sick, she went from twice a day down to once a day, and now hips not bothering her, she stopped taking it.  Insomnia Reports chronic issue with difficulty sleeping. On Temazepam 30mg  nightly with previously good results, lately some less effective, seems to be effective every 2-3rd day. 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 [employer], affected her hearing due to loud noises, this keeps her up at times, she is seeing Audiology, she uses white noise or fan to help   Health Maintenance: Due for [employer], declines today despite counseling on benefits   Depression screen [facility] 2/9 02/09/2019 01/28/2019 07/01/2018  Decreased Interest 0 0 0   Down, Depressed, Hopeless 1 1 0  PHQ - 2 Score 1 1 0  Altered sleeping - 0 -  Tired, decreased energy - 0 -  Change in appetite - 1 -  Feeling bad or failure about yourself  - 0 -  Trouble concentrating - 0 -  Moving slowly or fidgety/restless - 0 -  Suicidal thoughts - 0 -  PHQ-9 Score - 2 -  Difficult doing work/chores - Not difficult at all -    Social History   Tobacco Use  . Smoking status: Former Smoker    Packs/day: 2.00    Years: 40.00    Pack years: 80.00    Types: Cigarettes    Quit date: 06/27/2005    Years since quitting: 13.8  . Smokeless tobacco: Never Used  . Tobacco comment: quit Jun 27 2005  Substance Use Topics  . Alcohol use: No  . Drug use: No    Review of Systems Per HPI unless specifically indicated above     Objective:    BP (!) 144/80 (BP Location: Right Arm, Patient Position: Sitting, Cuff Size: Normal)   Pulse 67   Resp 15   Ht 4\' 11"  (1.499 m)   Wt 169 lb 3.2 oz (76.7 kg)   BMI 34.17 kg/m   Wt Readings from Last 3 Encounters:  05/04/19 169 lb 3.2 oz (76.7 kg)  01/28/19 173 lb (78.5 kg)  10/22/18 165 lb 8 oz (75.1 kg)    Physical Exam Vitals signs and  nursing note reviewed.  Constitutional:      General: She is not in acute distress.    Appearance: She is well-developed. She is not diaphoretic.     Comments: Well-appearing, comfortable, cooperative  HENT:     Head: Normocephalic and atraumatic.  Eyes:     General:        Right eye: No discharge.        Left eye: No discharge.     Conjunctiva/sclera: Conjunctivae normal.  Cardiovascular:     Rate and Rhythm: Normal rate.  Pulmonary:     Effort: Pulmonary effort is normal.  Skin:    General: Skin is warm and dry.     Findings: No erythema or rash.  Neurological:     Mental Status: She is alert and oriented to person, place, and time.  Psychiatric:        Behavior: Behavior normal.     Comments: Well groomed, good eye contact, normal speech and thoughts      Diabetic  Foot Exam - Simple   Simple Foot Form Diabetic Foot exam was performed with the following findings: Yes 05/04/2019 11:36 AM  Visual Inspection See comments: Yes Sensation Testing Intact to touch and monofilament testing bilaterally: Yes Pulse Check Posterior Tibialis and Dorsalis pulse intact bilaterally: Yes Comments Bilateral bunion great toe. Mild callus formation. No ulceration. Intact monofilament sensation.     Recent Labs    07/01/18 1133 12/29/18 0907 05/04/19 1129  HGBA1C 7.1* 7.4* 8.2*    Results for orders placed or performed in visit on 05/04/19  POCT glycosylated hemoglobin (Hb A1C)  Result Value Ref Range   Hemoglobin A1C 8.2 (A) 4.0 - 5.6 %   HbA1c POC (<> result, manual entry)     HbA1c, POC (prediabetic range)     HbA1c, POC (controlled diabetic range)        Assessment & Plan:   Problem List Items Addressed This Visit    CKD (chronic kidney disease), stage IV (HCC)    Stable CKD-IV per Dr Juleen China [facility] Last visit 04/2019 Reviewed report Secondary to DM, HTN, Age Cannot use Metformin due to GFR - on Rybelsus - will defer to [facility] Endocrinology now on DM regimen currently      Primary insomnia    Chronic problem Stable for years, now lately some episodic worsening, seems Temazepam may be less effective - She has tried other meds in past Ambien, Ambien CR, SSRi - Reconsider options - consider Trazodone in future if ready would taper off med Follow-up in future if ready to switch meds      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    Elevated A1c 8.2 from prior 7.4, only mild increase, still goal is 7-8 range, prefer tighter control in setting CKD IV No hypoglycemia Complications - CKD-III, other including, GERD, obesity, hypothyroidism, Gout - increases risk of future cardiovascular complications  - OFF Sulfonylurea, Metformin due to CKD  Followed by Dr Juleen China [facility] - cannot resume Metformin due to GFR  Plan - already on Rybelsus oral GLP1  14mg  daily - max dose, now she can discuss with [facility] Endocrinology has apt tomorrow with them for consultation initial - can discuss other treatment options, she has declined injectable GLP1 in past, that may improve her stability glucose control but other options are available to her      Relevant Orders   POCT glycosylated hemoglobin (Hb A1C) (Completed)      No orders of the defined types  were placed in this encounter.    Follow up plan: Return in about 3 months (around 08/03/2019) for Follow-up 3-4 months DM / CKD.   [name], [facility] 05/04/2019, 11:29 AM

## 2019-05-05 DIAGNOSIS — E1165 Type 2 diabetes mellitus with hyperglycemia: Secondary | ICD-10-CM | POA: Diagnosis not present

## 2019-05-05 DIAGNOSIS — E782 Mixed hyperlipidemia: Secondary | ICD-10-CM | POA: Diagnosis not present

## 2019-05-05 DIAGNOSIS — I10 Essential (primary) hypertension: Secondary | ICD-10-CM | POA: Diagnosis not present

## 2019-05-05 DIAGNOSIS — E669 Obesity, unspecified: Secondary | ICD-10-CM | POA: Diagnosis not present

## 2019-05-05 DIAGNOSIS — N184 Chronic kidney disease, stage 4 (severe): Secondary | ICD-10-CM | POA: Diagnosis not present

## 2019-05-11 ENCOUNTER — Encounter: Payer: Self-pay | Admitting: Podiatry

## 2019-05-11 ENCOUNTER — Other Ambulatory Visit: Payer: Self-pay

## 2019-05-11 ENCOUNTER — Other Ambulatory Visit: Payer: Self-pay | Admitting: Podiatry

## 2019-05-11 ENCOUNTER — Ambulatory Visit (INDEPENDENT_AMBULATORY_CARE_PROVIDER_SITE_OTHER): Payer: Medicare Other

## 2019-05-11 ENCOUNTER — Ambulatory Visit (INDEPENDENT_AMBULATORY_CARE_PROVIDER_SITE_OTHER): Payer: Medicare Other | Admitting: *Deleted

## 2019-05-11 ENCOUNTER — Ambulatory Visit (INDEPENDENT_AMBULATORY_CARE_PROVIDER_SITE_OTHER): Payer: Medicare Other | Admitting: Podiatry

## 2019-05-11 ENCOUNTER — Other Ambulatory Visit: Payer: Self-pay | Admitting: Internal Medicine

## 2019-05-11 ENCOUNTER — Ambulatory Visit: Payer: Medicare Other | Admitting: Podiatry

## 2019-05-11 DIAGNOSIS — S99922A Unspecified injury of left foot, initial encounter: Secondary | ICD-10-CM

## 2019-05-11 DIAGNOSIS — I442 Atrioventricular block, complete: Secondary | ICD-10-CM

## 2019-05-11 DIAGNOSIS — S93602A Unspecified sprain of left foot, initial encounter: Secondary | ICD-10-CM | POA: Diagnosis not present

## 2019-05-11 DIAGNOSIS — I10 Essential (primary) hypertension: Secondary | ICD-10-CM

## 2019-05-12 LAB — CUP PACEART REMOTE DEVICE CHECK
Battery Remaining Longevity: 32 mo
Battery Voltage: 2.95 V
Brady Statistic AP VP Percent: 92.36 %
Brady Statistic AP VS Percent: 0 %
Brady Statistic AS VP Percent: 7.62 %
Brady Statistic AS VS Percent: 0.02 %
Brady Statistic RA Percent Paced: 91.52 %
Brady Statistic RV Percent Paced: 99.85 %
Date Time Interrogation Session: 20201006132235
Implantable Lead Implant Date: 20141217
Implantable Lead Implant Date: 20141217
Implantable Lead Location: 753859
Implantable Lead Location: 753860
Implantable Lead Model: 5076
Implantable Lead Model: 5076
Implantable Pulse Generator Implant Date: 20141217
Lead Channel Impedance Value: 437 Ohm
Lead Channel Impedance Value: 456 Ohm
Lead Channel Impedance Value: 570 Ohm
Lead Channel Impedance Value: 570 Ohm
Lead Channel Pacing Threshold Amplitude: 0.375 V
Lead Channel Pacing Threshold Amplitude: 0.625 V
Lead Channel Pacing Threshold Pulse Width: 0.4 ms
Lead Channel Pacing Threshold Pulse Width: 0.4 ms
Lead Channel Sensing Intrinsic Amplitude: 4.75 mV
Lead Channel Sensing Intrinsic Amplitude: 4.75 mV
Lead Channel Sensing Intrinsic Amplitude: 9.5 mV
Lead Channel Sensing Intrinsic Amplitude: 9.5 mV
Lead Channel Setting Pacing Amplitude: 2 V
Lead Channel Setting Pacing Amplitude: 2.5 V
Lead Channel Setting Pacing Pulse Width: 0.4 ms
Lead Channel Setting Sensing Sensitivity: 0.9 mV

## 2019-05-14 NOTE — Progress Notes (Signed)
   HPI: 68 y.o. female presenting today with a chief complaint of intermittent burning pain of the left foot that began three days ago secondary to a twisting injury. She notes the pain at the lateral aspect of the foot. Walking increases the pain. She has been using a compression anklet and taking Tylenol and Tramadol for pain. Patient is here for further evaluation and treatment.   Past Medical History:  Diagnosis Date  . Complete heart block ([place])    a. s/p MDT PPM 2006 with device generator replacement 2014; b. followed by Dr. Caryl Comes  . GERD (gastroesophageal reflux disease)   . Hypertension   . Hypothyroid   . Pacemaker   . Persistent atrial fibrillation (HCC)    a. on Eliquis; b. CHADS2VASc 6 (HTN. age x 1, DM, TIA x 2, female); c. s/p DCCV 05/28/18  . Pulmonary hypertension ([place])    a. echo 2017: EF of 60-65%, no RWMA, mildly dilated left atrium, RVSF normal, PASP 64 mmHg  . TIA (transient ischemic attack)      Physical Exam: General: The patient is alert and oriented x3 in no acute distress.  Dermatology: Skin is warm, dry and supple bilateral lower extremities. Negative for open lesions or macerations.  Vascular: Palpable pedal pulses bilaterally. No edema or erythema noted. Capillary refill within normal limits.  Neurological: Epicritic and protective threshold grossly intact bilaterally.   Musculoskeletal Exam: Pain with palpation with mild edema noted to the lateral left midfoot. Range of motion within normal limits to all pedal and ankle joints bilateral. Muscle strength 5/5 in all groups bilateral.   Radiographic Exam:  Normal osseous mineralization. Joint spaces preserved. No fracture/dislocation/boney destruction.    Assessment: 1. Left foot sprain   Plan of Care:  1. Patient evaluated. X-Rays reviewed.  2. Compression anklet dispensed.  3. Post op shoe dispensed to use for two weeks.  4. Return to clinic as needed.       Edrick Kins, DPM [facility]  Dr. Edrick Kins, DPM    [address], [place] [address]                Office [phone]  Fax [phone]

## 2019-05-19 NOTE — Progress Notes (Signed)
Remote pacemaker transmission.   

## 2019-06-03 DIAGNOSIS — E782 Mixed hyperlipidemia: Secondary | ICD-10-CM | POA: Diagnosis not present

## 2019-06-03 DIAGNOSIS — E669 Obesity, unspecified: Secondary | ICD-10-CM | POA: Diagnosis not present

## 2019-06-03 DIAGNOSIS — E1165 Type 2 diabetes mellitus with hyperglycemia: Secondary | ICD-10-CM | POA: Diagnosis not present

## 2019-06-03 DIAGNOSIS — I10 Essential (primary) hypertension: Secondary | ICD-10-CM | POA: Diagnosis not present

## 2019-06-03 DIAGNOSIS — N184 Chronic kidney disease, stage 4 (severe): Secondary | ICD-10-CM | POA: Diagnosis not present

## 2019-06-15 ENCOUNTER — Ambulatory Visit (INDEPENDENT_AMBULATORY_CARE_PROVIDER_SITE_OTHER): Payer: Medicare Other | Admitting: Podiatry

## 2019-06-15 ENCOUNTER — Encounter: Payer: Self-pay | Admitting: Podiatry

## 2019-06-15 ENCOUNTER — Other Ambulatory Visit: Payer: Self-pay

## 2019-06-15 DIAGNOSIS — M659 Synovitis and tenosynovitis, unspecified: Secondary | ICD-10-CM | POA: Diagnosis not present

## 2019-06-18 NOTE — Progress Notes (Signed)
   Subjective:  68 y.o. female presenting today for follow up evaluation of a left foot sprain. She states she is doing better and has improved. She states her left foot and ankle feels weak. She is requesting an injection for treatment. She has been taking OTC Tylenol and using a compression anklet for treatment. Being on the foot increases the symptoms. Patient is here for further evaluation and treatment.   Past Medical History:  Diagnosis Date  . Complete heart block ([place])    a. s/p MDT PPM 2006 with device generator replacement 2014; b. followed by Dr. Caryl Comes  . GERD (gastroesophageal reflux disease)   . Hypertension   . Hypothyroid   . Pacemaker   . Persistent atrial fibrillation (HCC)    a. on Eliquis; b. CHADS2VASc 6 (HTN. age x 1, DM, TIA x 2, female); c. s/p DCCV 05/28/18  . Pulmonary hypertension ([place])    a. echo 2017: EF of 60-65%, no RWMA, mildly dilated left atrium, RVSF normal, PASP 64 mmHg  . TIA (transient ischemic attack)      Objective / Physical Exam:  General:  The patient is alert and oriented x3 in no acute distress. Dermatology:  Skin is warm, dry and supple bilateral lower extremities. Negative for open lesions or macerations. Vascular:  Palpable pedal pulses bilaterally. No edema or erythema noted. Capillary refill within normal limits. Neurological:  Epicritic and protective threshold grossly intact bilaterally.  Musculoskeletal Exam:  Pain on palpation to the anterior lateral medial aspects of the patient's left ankle. Mild edema noted. Range of motion within normal limits to all pedal and ankle joints bilateral. Muscle strength 5/5 in all groups bilateral.   Assessment: 1. Left ankle DJD /synovitis   Plan of Care:  1. Patient was evaluated. 2. Injection of 0.5 mL Celestone Soluspan injected in the patient's left ankle. 3. Continue taking OTC Tylenol as needed.  4. Continue using compression anklet.  5. Return to clinic as needed.    Edrick Kins, DPM [facility]  Dr. Edrick Kins, [address]                                        [place], [place] [address]                Office [phone]  Fax [phone]

## 2019-07-09 ENCOUNTER — Other Ambulatory Visit: Payer: Self-pay | Admitting: Internal Medicine

## 2019-07-09 NOTE — Telephone Encounter (Signed)
This is a [facility] pt 

## 2019-07-12 DIAGNOSIS — H40003 Preglaucoma, unspecified, bilateral: Secondary | ICD-10-CM | POA: Diagnosis not present

## 2019-07-19 ENCOUNTER — Other Ambulatory Visit: Payer: Self-pay | Admitting: Family Medicine

## 2019-07-19 DIAGNOSIS — M25552 Pain in left hip: Secondary | ICD-10-CM

## 2019-07-19 DIAGNOSIS — H40003 Preglaucoma, unspecified, bilateral: Secondary | ICD-10-CM | POA: Diagnosis not present

## 2019-07-19 DIAGNOSIS — G8929 Other chronic pain: Secondary | ICD-10-CM

## 2019-07-19 DIAGNOSIS — F5101 Primary insomnia: Secondary | ICD-10-CM

## 2019-08-10 ENCOUNTER — Ambulatory Visit: Payer: Medicare Other | Admitting: Family Medicine

## 2019-08-10 ENCOUNTER — Encounter: Payer: Self-pay | Admitting: Emergency Medicine

## 2019-08-10 ENCOUNTER — Emergency Department: Payer: Medicare Other

## 2019-08-10 ENCOUNTER — Telehealth: Payer: Self-pay

## 2019-08-10 ENCOUNTER — Ambulatory Visit (INDEPENDENT_AMBULATORY_CARE_PROVIDER_SITE_OTHER): Payer: Medicare Other | Admitting: *Deleted

## 2019-08-10 ENCOUNTER — Other Ambulatory Visit: Payer: Self-pay

## 2019-08-10 ENCOUNTER — Inpatient Hospital Stay
Admission: EM | Admit: 2019-08-10 | Discharge: 2019-08-14 | DRG: 177 | Disposition: A | Discharge status: Home Health | Payer: Medicare Other | Attending: Internal Medicine | Admitting: Internal Medicine

## 2019-08-10 DIAGNOSIS — J181 Lobar pneumonia, unspecified organism: Secondary | ICD-10-CM | POA: Diagnosis not present

## 2019-08-10 DIAGNOSIS — E1169 Type 2 diabetes mellitus with other specified complication: Secondary | ICD-10-CM | POA: Diagnosis present

## 2019-08-10 DIAGNOSIS — I10 Essential (primary) hypertension: Secondary | ICD-10-CM

## 2019-08-10 DIAGNOSIS — E1121 Type 2 diabetes mellitus with diabetic nephropathy: Secondary | ICD-10-CM | POA: Diagnosis not present

## 2019-08-10 DIAGNOSIS — Z6834 Body mass index (BMI) 34.0-34.9, adult: Secondary | ICD-10-CM

## 2019-08-10 DIAGNOSIS — Z79890 Hormone replacement therapy (postmenopausal): Secondary | ICD-10-CM | POA: Diagnosis not present

## 2019-08-10 DIAGNOSIS — Z885 Allergy status to narcotic agent status: Secondary | ICD-10-CM | POA: Diagnosis not present

## 2019-08-10 DIAGNOSIS — Z90710 Acquired absence of both cervix and uterus: Secondary | ICD-10-CM | POA: Diagnosis not present

## 2019-08-10 DIAGNOSIS — J188 Other pneumonia, unspecified organism: Secondary | ICD-10-CM

## 2019-08-10 DIAGNOSIS — I272 Pulmonary hypertension, unspecified: Secondary | ICD-10-CM | POA: Diagnosis present

## 2019-08-10 DIAGNOSIS — E669 Obesity, unspecified: Secondary | ICD-10-CM | POA: Diagnosis present

## 2019-08-10 DIAGNOSIS — N390 Urinary tract infection, site not specified: Secondary | ICD-10-CM

## 2019-08-10 DIAGNOSIS — Z8249 Family history of ischemic heart disease and other diseases of the circulatory system: Secondary | ICD-10-CM | POA: Diagnosis not present

## 2019-08-10 DIAGNOSIS — Z87891 Personal history of nicotine dependence: Secondary | ICD-10-CM

## 2019-08-10 DIAGNOSIS — J189 Pneumonia, unspecified organism: Secondary | ICD-10-CM | POA: Diagnosis not present

## 2019-08-10 DIAGNOSIS — Z9049 Acquired absence of other specified parts of digestive tract: Secondary | ICD-10-CM

## 2019-08-10 DIAGNOSIS — N183 Chronic kidney disease, stage 3 unspecified: Secondary | ICD-10-CM | POA: Diagnosis not present

## 2019-08-10 DIAGNOSIS — E785 Hyperlipidemia, unspecified: Secondary | ICD-10-CM | POA: Diagnosis present

## 2019-08-10 DIAGNOSIS — R531 Weakness: Secondary | ICD-10-CM | POA: Diagnosis not present

## 2019-08-10 DIAGNOSIS — E1122 Type 2 diabetes mellitus with diabetic chronic kidney disease: Secondary | ICD-10-CM | POA: Diagnosis present

## 2019-08-10 DIAGNOSIS — K219 Gastro-esophageal reflux disease without esophagitis: Secondary | ICD-10-CM | POA: Diagnosis present

## 2019-08-10 DIAGNOSIS — U071 COVID-19: Secondary | ICD-10-CM | POA: Diagnosis present

## 2019-08-10 DIAGNOSIS — I4819 Other persistent atrial fibrillation: Secondary | ICD-10-CM | POA: Diagnosis present

## 2019-08-10 DIAGNOSIS — G8929 Other chronic pain: Secondary | ICD-10-CM | POA: Diagnosis present

## 2019-08-10 DIAGNOSIS — Z8673 Personal history of transient ischemic attack (TIA), and cerebral infarction without residual deficits: Secondary | ICD-10-CM | POA: Diagnosis not present

## 2019-08-10 DIAGNOSIS — D631 Anemia in chronic kidney disease: Secondary | ICD-10-CM | POA: Diagnosis present

## 2019-08-10 DIAGNOSIS — I129 Hypertensive chronic kidney disease with stage 1 through stage 4 chronic kidney disease, or unspecified chronic kidney disease: Secondary | ICD-10-CM | POA: Diagnosis present

## 2019-08-10 DIAGNOSIS — Z950 Presence of cardiac pacemaker: Secondary | ICD-10-CM | POA: Diagnosis not present

## 2019-08-10 DIAGNOSIS — Z79899 Other long term (current) drug therapy: Secondary | ICD-10-CM

## 2019-08-10 DIAGNOSIS — D649 Anemia, unspecified: Secondary | ICD-10-CM | POA: Diagnosis not present

## 2019-08-10 DIAGNOSIS — Z7984 Long term (current) use of oral hypoglycemic drugs: Secondary | ICD-10-CM

## 2019-08-10 DIAGNOSIS — E039 Hypothyroidism, unspecified: Secondary | ICD-10-CM | POA: Diagnosis present

## 2019-08-10 DIAGNOSIS — Z811 Family history of alcohol abuse and dependence: Secondary | ICD-10-CM

## 2019-08-10 DIAGNOSIS — M810 Age-related osteoporosis without current pathological fracture: Secondary | ICD-10-CM | POA: Diagnosis present

## 2019-08-10 DIAGNOSIS — Z7901 Long term (current) use of anticoagulants: Secondary | ICD-10-CM | POA: Diagnosis not present

## 2019-08-10 DIAGNOSIS — J1282 Pneumonia due to coronavirus disease 2019: Secondary | ICD-10-CM

## 2019-08-10 DIAGNOSIS — I442 Atrioventricular block, complete: Secondary | ICD-10-CM | POA: Diagnosis not present

## 2019-08-10 DIAGNOSIS — N1832 Chronic kidney disease, stage 3b: Secondary | ICD-10-CM | POA: Diagnosis present

## 2019-08-10 DIAGNOSIS — E790 Hyperuricemia without signs of inflammatory arthritis and tophaceous disease: Secondary | ICD-10-CM | POA: Diagnosis present

## 2019-08-10 DIAGNOSIS — J9601 Acute respiratory failure with hypoxia: Secondary | ICD-10-CM | POA: Diagnosis present

## 2019-08-10 DIAGNOSIS — R0602 Shortness of breath: Secondary | ICD-10-CM | POA: Diagnosis not present

## 2019-08-10 DIAGNOSIS — N3000 Acute cystitis without hematuria: Secondary | ICD-10-CM | POA: Diagnosis present

## 2019-08-10 DIAGNOSIS — Z79891 Long term (current) use of opiate analgesic: Secondary | ICD-10-CM

## 2019-08-10 DIAGNOSIS — Z800 Family history of malignant neoplasm of digestive organs: Secondary | ICD-10-CM

## 2019-08-10 HISTORY — DX: Type 2 diabetes mellitus without complications: E11.9

## 2019-08-10 LAB — RESPIRATORY PANEL BY RT PCR (FLU A&B, COVID)
Influenza A by PCR: NEGATIVE
Influenza B by PCR: NEGATIVE
SARS Coronavirus 2 by RT PCR: POSITIVE — AB

## 2019-08-10 LAB — HEPATIC FUNCTION PANEL
ALT: 92 U/L — ABNORMAL HIGH (ref 0–44)
AST: 92 U/L — ABNORMAL HIGH (ref 15–41)
Albumin: 4 g/dL (ref 3.5–5.0)
Alkaline Phosphatase: 69 U/L (ref 38–126)
Bilirubin, Direct: 0.2 mg/dL (ref 0.0–0.2)
Indirect Bilirubin: 0.7 mg/dL (ref 0.3–0.9)
Total Bilirubin: 0.9 mg/dL (ref 0.3–1.2)
Total Protein: 8.5 g/dL — ABNORMAL HIGH (ref 6.5–8.1)

## 2019-08-10 LAB — CBC
HCT: 35.2 % — ABNORMAL LOW (ref 36.0–46.0)
Hemoglobin: 11.5 g/dL — ABNORMAL LOW (ref 12.0–15.0)
MCH: 29.6 pg (ref 26.0–34.0)
MCHC: 32.7 g/dL (ref 30.0–36.0)
MCV: 90.5 fL (ref 80.0–100.0)
Platelets: 276 10*3/uL (ref 150–400)
RBC: 3.89 MIL/uL (ref 3.87–5.11)
RDW: 14.6 % (ref 11.5–15.5)
WBC: 5.5 10*3/uL (ref 4.0–10.5)
nRBC: 0 % (ref 0.0–0.2)

## 2019-08-10 LAB — CUP PACEART REMOTE DEVICE CHECK
Battery Remaining Longevity: 30 mo
Battery Voltage: 2.95 V
Brady Statistic AP VP Percent: 99.33 %
Brady Statistic AP VS Percent: 0.02 %
Brady Statistic AS VP Percent: 0.65 %
Brady Statistic AS VS Percent: 0 %
Brady Statistic RA Percent Paced: 99.32 %
Brady Statistic RV Percent Paced: 99.75 %
Date Time Interrogation Session: 20210105120015
Implantable Lead Implant Date: 20141217
Implantable Lead Implant Date: 20141217
Implantable Lead Location: 753859
Implantable Lead Location: 753860
Implantable Lead Model: 5076
Implantable Lead Model: 5076
Implantable Pulse Generator Implant Date: 20141217
Lead Channel Impedance Value: 437 Ohm
Lead Channel Impedance Value: 456 Ohm
Lead Channel Impedance Value: 627 Ohm
Lead Channel Impedance Value: 627 Ohm
Lead Channel Pacing Threshold Amplitude: 0.375 V
Lead Channel Pacing Threshold Amplitude: 0.75 V
Lead Channel Pacing Threshold Pulse Width: 0.4 ms
Lead Channel Pacing Threshold Pulse Width: 0.4 ms
Lead Channel Sensing Intrinsic Amplitude: 4.875 mV
Lead Channel Sensing Intrinsic Amplitude: 4.875 mV
Lead Channel Sensing Intrinsic Amplitude: 8.625 mV
Lead Channel Sensing Intrinsic Amplitude: 8.625 mV
Lead Channel Setting Pacing Amplitude: 2 V
Lead Channel Setting Pacing Amplitude: 2.5 V
Lead Channel Setting Pacing Pulse Width: 0.4 ms
Lead Channel Setting Sensing Sensitivity: 0.9 mV

## 2019-08-10 LAB — BASIC METABOLIC PANEL
Anion gap: 15 (ref 5–15)
BUN: 32 mg/dL — ABNORMAL HIGH (ref 8–23)
CO2: 21 mmol/L — ABNORMAL LOW (ref 22–32)
Calcium: 9.5 mg/dL (ref 8.9–10.3)
Chloride: 98 mmol/L (ref 98–111)
Creatinine, Ser: 2.02 mg/dL — ABNORMAL HIGH (ref 0.44–1.00)
GFR calc Af Amer: 28 mL/min — ABNORMAL LOW (ref >60)
GFR calc non Af Amer: 24 mL/min — ABNORMAL LOW (ref >60)
Glucose, Bld: 105 mg/dL — ABNORMAL HIGH (ref 70–99)
Potassium: 3.3 mmol/L — ABNORMAL LOW (ref 3.5–5.1)
Sodium: 134 mmol/L — ABNORMAL LOW (ref 135–145)

## 2019-08-10 LAB — URINALYSIS, COMPLETE (UACMP) WITH MICROSCOPIC
Bilirubin Urine: NEGATIVE
Glucose, UA: NEGATIVE mg/dL
Hgb urine dipstick: NEGATIVE
Ketones, ur: NEGATIVE mg/dL
Nitrite: NEGATIVE
Protein, ur: 30 mg/dL — AB
Specific Gravity, Urine: 1.012 (ref 1.005–1.030)
pH: 5 (ref 5.0–8.0)

## 2019-08-10 LAB — ABO/RH: ABO/RH(D): A POS

## 2019-08-10 LAB — C-REACTIVE PROTEIN: CRP: 11.4 mg/dL — ABNORMAL HIGH (ref <1.0)

## 2019-08-10 LAB — LACTATE DEHYDROGENASE: LDH: 343 U/L — ABNORMAL HIGH (ref 98–192)

## 2019-08-10 LAB — PROCALCITONIN: Procalcitonin: 0.29 ng/mL

## 2019-08-10 LAB — FERRITIN: Ferritin: 954 ng/mL — ABNORMAL HIGH (ref 11–307)

## 2019-08-10 MED ORDER — ATORVASTATIN CALCIUM 20 MG PO TABS
20.0000 mg | ORAL_TABLET | Freq: Every day | ORAL | Status: DC
  Administered 2019-08-11 – 2019-08-13 (×3): 20 mg via ORAL
  Filled 2019-08-10 (×4): qty 1

## 2019-08-10 MED ORDER — AMLODIPINE BESYLATE 10 MG PO TABS
10.0000 mg | ORAL_TABLET | Freq: Every day | ORAL | Status: DC
  Administered 2019-08-11: 10:00:00 10 mg via ORAL
  Filled 2019-08-10: qty 1

## 2019-08-10 MED ORDER — APIXABAN 5 MG PO TABS
5.0000 mg | ORAL_TABLET | Freq: Two times a day (BID) | ORAL | Status: DC
  Administered 2019-08-11 – 2019-08-14 (×7): 5 mg via ORAL
  Filled 2019-08-10 (×7): qty 1

## 2019-08-10 MED ORDER — SODIUM CHLORIDE 0.9 % IV SOLN
100.0000 mg | Freq: Every day | INTRAVENOUS | Status: AC
  Administered 2019-08-11 – 2019-08-14 (×4): 100 mg via INTRAVENOUS
  Filled 2019-08-10: qty 100
  Filled 2019-08-10 (×4): qty 20

## 2019-08-10 MED ORDER — AMLODIPINE BESYLATE 5 MG PO TABS: 10.0000 mg | ORAL_TABLET | Freq: Every day | ORAL | Status: DC

## 2019-08-10 MED ORDER — ACETAMINOPHEN 325 MG PO TABS: 650.0000 mg | ORAL_TABLET | Freq: Four times a day (QID) | ORAL | Status: DC | PRN

## 2019-08-10 MED ORDER — PANTOPRAZOLE SODIUM 40 MG PO TBEC
40.0000 mg | DELAYED_RELEASE_TABLET | Freq: Every day | ORAL | Status: DC
  Administered 2019-08-11 – 2019-08-14 (×4): 40 mg via ORAL
  Filled 2019-08-10 (×4): qty 1

## 2019-08-10 MED ORDER — ALLOPURINOL 100 MG PO TABS
100.0000 mg | ORAL_TABLET | Freq: Every day | ORAL | Status: DC
  Administered 2019-08-11 – 2019-08-14 (×5): 100 mg via ORAL
  Filled 2019-08-10 (×6): qty 1

## 2019-08-10 MED ORDER — SODIUM CHLORIDE 0.9 % IV BOLUS
500.0000 mL | Freq: Once | INTRAVENOUS | Status: AC
  Administered 2019-08-10: 500 mL via INTRAVENOUS

## 2019-08-10 MED ORDER — SODIUM CHLORIDE 0.9 % IV SOLN
200.0000 mg | Freq: Once | INTRAVENOUS | Status: AC
  Administered 2019-08-10: 200 mg via INTRAVENOUS
  Filled 2019-08-10: qty 40

## 2019-08-10 MED ORDER — DOCUSATE SODIUM 100 MG PO CAPS
100.0000 mg | ORAL_CAPSULE | Freq: Two times a day (BID) | ORAL | Status: DC
  Administered 2019-08-11: 100 mg via ORAL
  Filled 2019-08-10 (×3): qty 1

## 2019-08-10 MED ORDER — DEXAMETHASONE SODIUM PHOSPHATE 10 MG/ML IJ SOLN
8.0000 mg | Freq: Once | INTRAMUSCULAR | Status: AC
  Administered 2019-08-10: 8 mg via INTRAVENOUS
  Filled 2019-08-10: qty 1

## 2019-08-10 MED ORDER — ENOXAPARIN SODIUM 30 MG/0.3ML ~~LOC~~ SOLN
30.0000 mg | SUBCUTANEOUS | Status: DC
  Filled 2019-08-10: qty 0.3

## 2019-08-10 MED ORDER — SODIUM CHLORIDE 0.45 % IV SOLN: INTRAVENOUS | Status: DC

## 2019-08-10 MED ORDER — SODIUM CHLORIDE 0.9 % IV SOLN: 250.0000 mL | INTRAVENOUS | Status: DC | PRN

## 2019-08-10 MED ORDER — DULAGLUTIDE 0.75 MG/0.5ML ~~LOC~~ SOAJ: 0.7500 mg | SUBCUTANEOUS | Status: DC

## 2019-08-10 MED ORDER — SODIUM CHLORIDE 0.9% FLUSH: 3.0000 mL | Freq: Two times a day (BID) | INTRAVENOUS | Status: DC

## 2019-08-10 MED ORDER — FUROSEMIDE 40 MG PO TABS
40.0000 mg | ORAL_TABLET | Freq: Every day | ORAL | Status: DC
  Administered 2019-08-11 – 2019-08-14 (×4): 40 mg via ORAL
  Filled 2019-08-10 (×5): qty 1

## 2019-08-10 MED ORDER — TEMAZEPAM 7.5 MG PO CAPS
30.0000 mg | ORAL_CAPSULE | Freq: Every evening | ORAL | Status: DC | PRN
  Administered 2019-08-11 – 2019-08-13 (×3): 30 mg via ORAL
  Filled 2019-08-10 (×3): qty 4

## 2019-08-10 MED ORDER — TRAMADOL HCL 50 MG PO TABS
50.0000 mg | ORAL_TABLET | Freq: Two times a day (BID) | ORAL | Status: DC
  Administered 2019-08-11 – 2019-08-14 (×7): 50 mg via ORAL
  Filled 2019-08-10 (×8): qty 1

## 2019-08-10 MED ORDER — SODIUM CHLORIDE 0.9 % IV SOLN
1.0000 g | Freq: Once | INTRAVENOUS | Status: AC
  Administered 2019-08-10: 1 g via INTRAVENOUS
  Filled 2019-08-10: qty 10

## 2019-08-10 MED ORDER — METOPROLOL SUCCINATE ER 25 MG PO TB24
100.0000 mg | ORAL_TABLET | Freq: Every day | ORAL | Status: DC
  Administered 2019-08-11 – 2019-08-14 (×2): 100 mg via ORAL
  Filled 2019-08-10 (×5): qty 4

## 2019-08-10 MED ORDER — SODIUM CHLORIDE 0.9% FLUSH: 3.0000 mL | INTRAVENOUS | Status: DC | PRN

## 2019-08-10 MED ORDER — CIPROFLOXACIN HCL 500 MG PO TABS
250.0000 mg | ORAL_TABLET | Freq: Every day | ORAL | Status: DC
  Administered 2019-08-11 – 2019-08-12 (×2): 250 mg via ORAL
  Filled 2019-08-10 (×2): qty 1

## 2019-08-10 MED ORDER — SODIUM CHLORIDE 0.9% FLUSH
3.0000 mL | Freq: Once | INTRAVENOUS | Status: AC
  Administered 2019-08-10: 3 mL via INTRAVENOUS

## 2019-08-10 MED ORDER — BACLOFEN 10 MG PO TABS
5.0000 mg | ORAL_TABLET | Freq: Every day | ORAL | Status: DC
  Administered 2019-08-11 – 2019-08-13 (×4): 5 mg via ORAL
  Filled 2019-08-10 (×5): qty 0.5

## 2019-08-10 MED ORDER — TRAZODONE HCL 50 MG PO TABS
25.0000 mg | ORAL_TABLET | Freq: Every evening | ORAL | Status: DC | PRN
  Filled 2019-08-10: qty 0.5

## 2019-08-10 MED ORDER — LEVOTHYROXINE SODIUM 25 MCG PO TABS
25.0000 ug | ORAL_TABLET | Freq: Every day | ORAL | Status: DC
  Administered 2019-08-11 – 2019-08-14 (×4): 25 ug via ORAL
  Filled 2019-08-10 (×4): qty 1
  Filled 2019-08-10: qty 0.5
  Filled 2019-08-10: qty 1

## 2019-08-10 MED ORDER — AMIODARONE HCL 200 MG PO TABS
100.0000 mg | ORAL_TABLET | Freq: Two times a day (BID) | ORAL | Status: DC
  Administered 2019-08-11 – 2019-08-14 (×8): 100 mg via ORAL
  Filled 2019-08-10 (×9): qty 1

## 2019-08-10 MED ORDER — LISINOPRIL 20 MG PO TABS
20.0000 mg | ORAL_TABLET | Freq: Every day | ORAL | Status: DC
  Administered 2019-08-11: 10:00:00 20 mg via ORAL
  Filled 2019-08-10 (×2): qty 1

## 2019-08-10 MED ORDER — TOCILIZUMAB 400 MG/20ML IV SOLN
8.0000 mg/kg | Freq: Once | INTRAVENOUS | Status: AC
  Administered 2019-08-11: 616 mg via INTRAVENOUS
  Filled 2019-08-10: qty 30.8

## 2019-08-10 NOTE — ED Notes (Signed)
Patient ambulatory to use restroom. Loose bowel movement and urination noted.

## 2019-08-10 NOTE — Telephone Encounter (Signed)
Patient triaged and advised to seek care immediately at hospital ED, she had transportation with daughter in law and agree to go by personal vehicle.  [name], [facility] 08/10/2019, 11:50 AM

## 2019-08-10 NOTE — Progress Notes (Signed)
PHARMACIST - PHYSICIAN COMMUNICATION  CONCERNING:  Enoxaparin (Lovenox) for DVT Prophylaxis    RECOMMENDATION: Patient was prescribed enoxaparin 40mg  q24 hours for VTE prophylaxis.   Filed Weights   08/10/19 1151  Weight: 170 lb (77.1 kg)    Body mass index is 34.34 kg/m.  Estimated Creatinine Clearance: 23.2 mL/min (A) (by C-G formula based on SCr of 2.02 mg/dL (H)).  Patient is candidate for enoxaparin 30mg  every 24 hours based on CrCl <83ml/min   DESCRIPTION:   Patient is now receiving enoxaparin 30mg  every 24 hours.  [place] Resident 08/10/2019 9:26 PM

## 2019-08-10 NOTE — ED Notes (Signed)
Dr. Joan Mayans at bedside at this time.

## 2019-08-10 NOTE — ED Notes (Signed)
Patient ambulatory around room with one person assist. Upon returning to bed, patient reports increased work of breathing and SOB. Patient oxygen saturation 82% on room air. After sitting quietly in bed for 1-2 minutes, patient sat returned to 94% on room air.

## 2019-08-10 NOTE — ED Triage Notes (Signed)
Pt states she thinks her hgb is low because she has been feeling weak since Christmas. Was told to take iron pills per dr but it caused constipation so she stopped them. Denies fever or cough. Sats 91% on RA in triage, states "now that I think about it, I am feeling sob." Appears in no distress at this time.

## 2019-08-10 NOTE — ED Notes (Signed)
Patient placed on 2L [place] due to dropping O2 saturation while talking and sleeping. Patient tolerating well.

## 2019-08-10 NOTE — ED Provider Notes (Signed)
[facility] Emergency Department Provider Note  ____________________________________________   First MD Initiated Contact with Patient 08/10/19 1507     (approximate)  I have reviewed the triage vital signs and the nursing notes.  History  Chief Complaint Weakness    HPI Renee Boyd is a 69 y.o. female history of heart block, status post pacemaker, hypothyroidism, atrial fibrillation on Eliquis, TIA who presents emergency department for significant fatigue, generalized weakness, decreased appetite, changes to taste, loose stool, mild shortness of breath.  Patient states symptoms have been present for about a week and a half, and have been progressively worsening.  She has had very minimal PO intake due to lack of appetite and changes in taste.  She reports extreme fatigue, hardly being able to get out of bed or watch TV.  She denies any fevers, cough, or exposure to any known sick contacts.  She does report a mild shortness of breath, primarily with exertion.  No associated chest pain.  She has had loose stool over the same time period, one episode of vomiting several days ago, none since then.  She denies any dysuria, hematuria, malodorous urine.  She denies any bloody or black stools.   Past Medical Hx Past Medical History:  Diagnosis Date  . Complete heart block ([place])    a. s/p MDT PPM 2006 with device generator replacement 2014; b. followed by Dr. Caryl Comes  . GERD (gastroesophageal reflux disease)   . Hypertension   . Hypothyroid   . Pacemaker   . Persistent atrial fibrillation (HCC)    a. on Eliquis; b. CHADS2VASc 6 (HTN. age x 1, DM, TIA x 2, female); c. s/p DCCV 05/28/18  . Pulmonary hypertension ([place])    a. echo 2017: EF of 60-65%, no RWMA, mildly dilated left atrium, RVSF normal, PASP 64 mmHg  . TIA (transient ischemic attack)     Problem List Patient Active Problem List   Diagnosis Date Noted  . Pulmonary hypertension, unspecified ([place])  01/01/2019  . Normocytic anemia 01/01/2019  . Osteoporosis without current pathological fracture 11/22/2017  . Complete heart block ([place]) 11/21/2017  . Muscle cramps at night 09/16/2017  . Hyperuricemia 12/10/2016  . Advanced care planning/counseling discussion 12/10/2016  . Chronic bursitis of left shoulder 09/10/2016  . Hypothyroidism 09/10/2016  . Benign hypertension with CKD (chronic kidney disease) stage III ([place]) 09/10/2016  . CKD (chronic kidney disease), stage IV ([place]) 05/13/2016  . Primary insomnia 05/13/2016  . Type 2 diabetes with nephropathy ([place]) 05/13/2016  . Heart block 05/13/2016  . Chronic pain 05/13/2016  . Chronic hip pain 05/13/2016    Past Surgical Hx Past Surgical History:  Procedure Laterality Date  . ABDOMINAL HYSTERECTOMY    . CARDIOVERSION N/A 05/28/2018   Procedure: CARDIOVERSION;  Surgeon: Minna Merritts, MD;  Location: [facility] ORS;  Service: Cardiovascular;  Laterality: N/A;  . CHOLECYSTECTOMY    . TONSILLECTOMY    . TUBAL LIGATION      Medications Prior to Admission medications   Medication Sig Start Date End Date Taking? Authorizing Provider  acetaminophen (TYLENOL) 650 MG CR tablet Take by mouth.    [provider]  allopurinol (ZYLOPRIM) 100 MG tablet TAKE 1 TABLET DAILY 04/21/19   Parks Ranger, Devonne Doughty, DO  amiodarone (PACERONE) 200 MG tablet TAKE ONE-HALF (1/2) TABLET TWICE A DAY 07/09/19   Deboraha Sprang, MD  amLODipine (NORVASC) 10 MG tablet TAKE 1 TABLET DAILY 05/11/19   Deboraha Sprang, MD  apixaban Arne [place]) 5  MG TABS tablet Take 1 tablet (5 mg total) by mouth 2 (two) times daily. 03/04/19   Deboraha Sprang, MD  atorvastatin (LIPITOR) 20 MG tablet TAKE 1 TABLET DAILY 03/08/19   Deboraha Sprang, MD  Baclofen 5 MG TABS TAKE 1 TABLET AT BEDTIME 04/19/19   Karamalegos, Devonne Doughty, DO  calcitRIOL (ROCALTROL) 0.25 MCG capsule  12/25/18   [provider]  desonide (DESOWEN) 0.05 % lotion Apply 1 application topically 2 (two)  times daily as needed. For up to 1 week max, caution if using on face. Patient not taking: Reported on 02/09/2019 06/01/18   Olin Hauser, DO  Dulaglutide 0.75 MG/0.5ML SOPN Inject into the skin. 05/05/19   [provider]  ferrous sulfate 325 (65 FE) MG EC tablet Take 325 mg by mouth daily.    [provider]  furosemide (LASIX) 20 MG tablet TAKE 2 TABLETS EVERY MORNING 04/21/19   Deboraha Sprang, MD  gabapentin (NEURONTIN) 300 MG capsule Take 1 capsule (300 mg total) by mouth 2 (two) times daily. Patient not taking: Reported on 05/04/2019 05/25/18   Olin Hauser, DO  glucose blood test strip Check blood sugar twice daily as instructed 11/23/18   Karamalegos, Devonne Doughty, DO  ibandronate (BONIVA) 150 MG tablet TAKE 1 TABLET EVERY 30 DAYS. TAKE IN THE MORNING WITH A FULL GLASS OF WATER, ON AN EMPTY STOMACH 02/21/19   Parks Ranger, Devonne Doughty, DO  ibuprofen (ADVIL) 800 MG tablet  02/23/19   [provider]  levothyroxine (SYNTHROID, LEVOTHROID) 25 MCG tablet TAKE 1 TABLET DAILY BEFORE BREAKFAST 11/02/18   Karamalegos, Devonne Doughty, DO  lisinopril (PRINIVIL,ZESTRIL) 20 MG tablet Take 1 tablet (20 mg total) by mouth daily. 03/27/18   Karamalegos, Devonne Doughty, DO  metoprolol succinate (TOPROL-XL) 100 MG 24 hr tablet TAKE 1 TABLET DAILY WITH OR IMMEDIATELY FOLLOWING A MEAL 03/26/19   Deboraha Sprang, MD  omeprazole (PRILOSEC) 20 MG capsule Take 1 capsule (20 mg total) by mouth 2 (two) times daily before a meal. 02/21/19   Karamalegos, Devonne Doughty, DO  Semaglutide (RYBELSUS) 14 MG TABS Take 14 mg by mouth daily before breakfast. Take on empty stomach, with no other medications Patient not taking: Reported on 05/04/2019 04/02/19   Olin Hauser, DO  temazepam (RESTORIL) 30 MG capsule TAKE 1 CAPSULE BY MOUTH AT BEDTIME AS NEEDED SLEEP 07/19/19   Parks Ranger, Devonne Doughty, DO  traMADol (ULTRAM) 50 MG tablet TAKE 1 TABLET BY MOUTH TWICE DAILY 07/19/19    Olin Hauser, DO    Allergies Morphine and related and Percocet [oxycodone-acetaminophen]  Family Hx Family History  Problem Relation Age of Onset  . Hypertension Other   . Heart disease Mother        MI  . Alcohol abuse Father   . Autism Son   . Heart disease Maternal Grandmother   . Cancer Son        colon  . Miscarriages / Stillbirths Maternal Aunt     Social Hx Social History   Tobacco Use  . Smoking status: Former Smoker    Packs/day: 2.00    Years: 40.00    Pack years: 80.00    Types: Cigarettes    Quit date: 06/27/2005    Years since quitting: 14.1  . Smokeless tobacco: Never Used  . Tobacco comment: quit Jun 27 2005  Substance Use Topics  . Alcohol use: No  . Drug use: No     Review of Systems  Constitutional:  Negative for fever, chills. + fatigue, weakness, decreased appetite Eyes: Negative for visual changes. ENT: Negative for sore throat. Cardiovascular: Negative for chest pain. Respiratory: Negative for shortness of breath. Gastrointestinal: + loose stool Genitourinary: Negative for dysuria. Musculoskeletal: Negative for leg swelling. Skin: Negative for rash. Neurological: Negative for for headaches.   Physical Exam  Vital Signs: ED Triage Vitals  Enc Vitals Group     BP 08/10/19 1149 (!) 119/58     Pulse Rate 08/10/19 1147 62     Resp 08/10/19 1147 16     Temp 08/10/19 1147 99.1 F (37.3 C)     Temp Source 08/10/19 1147 Oral     SpO2 08/10/19 1149 91 %     Weight 08/10/19 1151 170 lb (77.1 kg)     Height 08/10/19 1151 4\' 11"  (1.499 m)     Head Circumference --      Peak Flow --      Pain Score 08/10/19 1151 0     Pain Loc --      Pain Edu? --      Excl. in [place]? --     Constitutional: Alert and oriented. Appears fatigued.  Head: Normocephalic. Atraumatic. Eyes: Conjunctivae clear. Sclera anicteric. Nose: No congestion. No rhinorrhea. Mouth/Throat: Wearing mask.  Neck: No stridor.   Cardiovascular: Normal rate,  regular rhythm. Extremities well perfused. Respiratory: Normal respiratory effort. Oxygen drops to 90% on RA at rest. Down to low 80s with ambulation. Placed on 2 L . Gastrointestinal: Soft. Non-tender. Non-distended.  Musculoskeletal: No lower extremity edema. No deformities. Neurologic:  Normal speech and language. No gross focal neurologic deficits are appreciated.  Skin: Skin is warm, dry and intact. No rash noted. Psychiatric: Mood and affect are appropriate for situation.  EKG  Personally reviewed.   Rate: 60 Rhythm: paced Axis: LAD Intervals: abnormal 2/2 paced rhythm Paced rhythm Negative Sgarbossa criteria    Radiology  CXR: IMPRESSION:  1. Multilobar bilateral pneumonia, concerning for possible atypical  infection such as viral pneumonia.  2. Aortic atherosclerosis.    Procedures  Procedure(s) performed (including critical care):  Procedures   Initial Impression / Assessment and Plan / ED Course  69 y.o. female who presents to the ED for generalized weakness, loose stool, decreased appetite, SOB.  Ddx: COVID-19, UTI, anemia, electrolyte abnormality, dehydration  Will obtain labs, EKG, imaging, reassess.  Labs revealed mild AKI. Hemoglobin near baseline. UA concerning for infection with LE, WBC, bacteria, WBC clumps. Will treat with IVF and ceftriaxone. CXR concerning for possible COVID PNA, obtaining swab. Patient initially hesitant to pursue further treatment/testing, but after discussion with her and her daughter, agrees with plan of care and hospitalization if necessary.   COVID swab positive.  Patient does desaturate to the low 80s with ambulation, placed on 2 L nasal cannula.  Pharmacy consult for remdesivir, Decadron ordered.  We will plan for admission.  Discussed with hospitalist.   Final Clinical Impression(s) / ED Diagnosis  Final diagnoses:  Weakness  Urinary tract infection in elderly patient  Multifocal pneumonia  COVID-19        Note:  This document was prepared using Dragon voice recognition software and may include unintentional dictation errors.   Lilia Pro., MD 08/10/19 [phone]

## 2019-08-10 NOTE — Telephone Encounter (Addendum)
The pt came in the office severely fatigue and weak x 4 days. She state that she cannot hardly walk and have been in the bed for 4 days because she feels so bad. She denies coughing, SOB, fever or any other symptoms of coronavirus. The pt is concern that her hemoglobin is low, because she has a history of anemia. I informed the patient and her daughter-n-law that it best for her to go to the hospital so they can do stat labs and rule out that this is not Coronavirus or low hemoglobin. She verbalize understanding no questions or concerns.

## 2019-08-10 NOTE — H&P (Addendum)
History and Physical    Renee Boyd NGE:[id number] DOB: 02/13/1951 DOA: 08/10/2019  PCP: Olin Hauser, DO (Confirm with patient/family/NH records and if not entered, this has to be entered at [facility] point of entry) Patient coming from: patient is coming from home  I have personally briefly reviewed patient's old medical records in [place]  Chief Complaint: profound weakness, diarrhea, poor appetite, mild SOB  HPI: Renee Boyd is a 69 y.o. female with medical history significant of heart block, status post pacemaker, hypothyroidism, atrial fibrillation on Eliquis, TIA who presents emergency department for significant fatigue, generalized weakness, decreased appetite, changes to taste, loose stool, mild shortness of breath.  Patient states symptoms have been present for about a week and a half, and have been progressively worsening.  She has had very minimal PO intake due to lack of appetite and changes in taste.  She reports extreme fatigue, hardly being able to get out of bed or watch TV.  She denies any fevers, cough, or exposure to any known sick contacts.  She does report a mild shortness of breath, primarily with exertion.  No associated chest pain.  She has had loose stool over the same time period, one episode of vomiting several days ago, none since then.  She denies any dysuria, hematuria, malodorous urine.  She denies any bloody or black stools. .  ED Course: Patient hemodynamically stable. Mild hypoxemia with O2 sat 89% on room air. Covid Ag test +, abnl CXR with multi-lobar  Pna, elevated inflammatory markers: LDH 343, Ferritin 954, CRP 11.4. Nl WBC, minimally elevated LFTs, mild UTI. Remdesivir was started in ED, IV steroids were administered. TRH called to admit patient who is at high risk for progressive Covid 19 pneumonia.  Review of Systems: As per HPI otherwise 10 point review of systems negative.  Unacceptable ROS statements: "10 systems reviewed," "Extensive"  (without elaboration).  Acceptable ROS statements: "All others negative," "All others reviewed and are negative," and "All others unremarkable," with at [place] documented Can't double dip - if using for HPI can't use for ROS  Past Medical History:  Diagnosis Date  . Complete heart block ([place])    a. s/p MDT PPM 2006 with device generator replacement 2014; b. followed by Dr. Caryl Comes  . Diabetes ([place])   . GERD (gastroesophageal reflux disease)   . Hypertension   . Hypothyroid   . Pacemaker   . Persistent atrial fibrillation (HCC)    a. on Eliquis; b. CHADS2VASc 6 (HTN. age x 1, DM, TIA x 2, female); c. s/p DCCV 05/28/18  . Pulmonary hypertension ([place])    a. echo 2017: EF of 60-65%, no RWMA, mildly dilated left atrium, RVSF normal, PASP 64 mmHg  . TIA (transient ischemic attack)     Past Surgical History:  Procedure Laterality Date  . ABDOMINAL HYSTERECTOMY    . CARDIOVERSION N/A 05/28/2018   Procedure: CARDIOVERSION;  Surgeon: Minna Merritts, MD;  Location: [facility] ORS;  Service: Cardiovascular;  Laterality: N/A;  . CHOLECYSTECTOMY    . TONSILLECTOMY    . TUBAL LIGATION       reports that she quit smoking about 14 years ago. Her smoking use included cigarettes. She has a 80.00 pack-year smoking history. She has never used smokeless tobacco. She reports that she does not drink alcohol or use drugs.  Allergies  Allergen Reactions  . Morphine And Related Itching  . Percocet [Oxycodone-Acetaminophen] Other (See Comments)    Hallucinations  Family History  Problem Relation Age of Onset  . Hypertension Other   . Heart disease Mother        MI  . Alcohol abuse Father   . Autism Son   . Heart disease Maternal Grandmother   . Cancer Son        colon  . Miscarriages / Stillbirths Maternal Aunt    Unacceptable: Noncontributory, unremarkable, or negative. Acceptable: Family history reviewed and not pertinent (If you reviewed it)  Prior to Admission medications    Medication Sig Start Date End Date Taking? Authorizing Provider  acetaminophen (TYLENOL) 650 MG CR tablet Take by mouth.    [provider]  allopurinol (ZYLOPRIM) 100 MG tablet TAKE 1 TABLET DAILY 04/21/19   Parks Ranger, Devonne Doughty, DO  amiodarone (PACERONE) 200 MG tablet TAKE ONE-HALF (1/2) TABLET TWICE A DAY 07/09/19   Deboraha Sprang, MD  amLODipine (NORVASC) 10 MG tablet TAKE 1 TABLET DAILY 05/11/19   Deboraha Sprang, MD  apixaban (ELIQUIS) 5 MG TABS tablet Take 1 tablet (5 mg total) by mouth 2 (two) times daily. 03/04/19   Deboraha Sprang, MD  atorvastatin (LIPITOR) 20 MG tablet TAKE 1 TABLET DAILY 03/08/19   Deboraha Sprang, MD  Baclofen 5 MG TABS TAKE 1 TABLET AT BEDTIME 04/19/19   Karamalegos, Devonne Doughty, DO  calcitRIOL (ROCALTROL) 0.25 MCG capsule  12/25/18   [provider]  desonide (DESOWEN) 0.05 % lotion Apply 1 application topically 2 (two) times daily as needed. For up to 1 week max, caution if using on face. Patient not taking: Reported on 02/09/2019 06/01/18   Olin Hauser, DO  Dulaglutide 0.75 MG/0.5ML SOPN Inject into the skin. 05/05/19   [provider]  ferrous sulfate 325 (65 FE) MG EC tablet Take 325 mg by mouth daily.    [provider]  furosemide (LASIX) 20 MG tablet TAKE 2 TABLETS EVERY MORNING 04/21/19   Deboraha Sprang, MD  gabapentin (NEURONTIN) 300 MG capsule Take 1 capsule (300 mg total) by mouth 2 (two) times daily. Patient not taking: Reported on 05/04/2019 05/25/18   Olin Hauser, DO  glucose blood test strip Check blood sugar twice daily as instructed 11/23/18   Karamalegos, Devonne Doughty, DO  ibandronate (BONIVA) 150 MG tablet TAKE 1 TABLET EVERY 30 DAYS. TAKE IN THE MORNING WITH A FULL GLASS OF WATER, ON AN EMPTY STOMACH 02/21/19   Parks Ranger, Devonne Doughty, DO  ibuprofen (ADVIL) 800 MG tablet  02/23/19   [provider]  levothyroxine (SYNTHROID, LEVOTHROID) 25 MCG tablet TAKE 1 TABLET DAILY BEFORE  BREAKFAST 11/02/18   Karamalegos, Devonne Doughty, DO  lisinopril (PRINIVIL,ZESTRIL) 20 MG tablet Take 1 tablet (20 mg total) by mouth daily. 03/27/18   Karamalegos, Devonne Doughty, DO  metoprolol succinate (TOPROL-XL) 100 MG 24 hr tablet TAKE 1 TABLET DAILY WITH OR IMMEDIATELY FOLLOWING A MEAL 03/26/19   Deboraha Sprang, MD  omeprazole (PRILOSEC) 20 MG capsule Take 1 capsule (20 mg total) by mouth 2 (two) times daily before a meal. 02/21/19   Karamalegos, Devonne Doughty, DO  Semaglutide (RYBELSUS) 14 MG TABS Take 14 mg by mouth daily before breakfast. Take on empty stomach, with no other medications Patient not taking: Reported on 05/04/2019 04/02/19   Olin Hauser, DO  temazepam (RESTORIL) 30 MG capsule TAKE 1 CAPSULE BY MOUTH AT BEDTIME AS NEEDED SLEEP 07/19/19   Karamalegos, Devonne Doughty, DO  traMADol (ULTRAM) 50 MG tablet TAKE 1 TABLET BY MOUTH TWICE DAILY  07/19/19   Olin Hauser, DO    Physical Exam: Vitals:   08/10/19 1815 08/10/19 1900 08/10/19 1952 08/10/19 2100  BP:  121/76  140/80  Pulse: (!) 59 60  60  Resp:  19  (!) 22  Temp:      TempSrc:      SpO2: 94% 94% 94% 97%  Weight:      Height:        Constitutional: NAD, calm, comfortable Vitals:   08/10/19 1815 08/10/19 1900 08/10/19 1952 08/10/19 2100  BP:  121/76  140/80  Pulse: (!) 59 60  60  Resp:  19  (!) 22  Temp:      TempSrc:      SpO2: 94% 94% 94% 97%  Weight:      Height:       General:  Obese elderly woman in no distress at rest Eyes: PERRL, lids and conjunctivae normal ENMT: Mucous membranes are moist. Posterior pharynx clear of any exudate or lesions. Dentition - upper denture, lower partial (not wearing at exam) Neck: normal, supple, no masses, no thyromegaly Respiratory: clear to auscultation bilaterally, no wheezing, no crackles. Normal respiratory effort. No accessory muscle use.  Cardiovascular: Regular rate and rhythm, no murmurs / rubs / gallops. No extremity edema. 2+ pedal pulses. No  carotid bruits.  Abdomen: Obese, no tenderness, no masses palpated. No hepatosplenomegaly. Bowel sounds positive.  Musculoskeletal: no clubbing / cyanosis. No joint deformity upper and lower extremities. Good ROM, no contractures. Normal muscle tone.  Skin: no rashes, lesions, ulcers. No induration Neurologic: CN 2-12 grossly intact. Sensation intact, DTR normal. Strength 5/5 in all 4.  Psychiatric: Normal judgment and insight. Alert and oriented x 3. Normal mood.   (Anything < 9 systems with 2 bullets each down codes to level 1) (If patient refuses exam can't bill higher level) (Make sure to document decubitus ulcers present on admission -- if possible -- and whether patient has chronic indwelling catheter at time of admission)  Labs on Admission: I have personally reviewed following labs and imaging studies  CBC: Recent Labs  Lab 08/10/19 1152  WBC 5.5  HGB 11.5*  HCT 35.2*  MCV 90.5  PLT 277   Basic Metabolic Panel: Recent Labs  Lab 08/10/19 1152  NA 134*  K 3.3*  CL 98  CO2 21*  GLUCOSE 105*  BUN 32*  CREATININE 2.02*  CALCIUM 9.5   GFR: Estimated Creatinine Clearance: 23.2 mL/min (A) (by C-G formula based on SCr of 2.02 mg/dL (H)). Liver Function Tests: Recent Labs  Lab 08/10/19 1704  AST 92*  ALT 92*  ALKPHOS 69  BILITOT 0.9  PROT 8.5*  ALBUMIN 4.0   No results for input(s): LIPASE, AMYLASE in the last 168 hours. No results for input(s): AMMONIA in the last 168 hours. Coagulation Profile: No results for input(s): INR, PROTIME in the last 168 hours. Cardiac Enzymes: No results for input(s): CKTOTAL, CKMB, CKMBINDEX, TROPONINI in the last 168 hours. BNP (last 3 results) No results for input(s): PROBNP in the last 8760 hours. HbA1C: No results for input(s): HGBA1C in the last 72 hours. CBG: No results for input(s): GLUCAP in the last 168 hours. Lipid Profile: No results for input(s): CHOL, HDL, LDLCALC, TRIG, CHOLHDL, LDLDIRECT in the last 72  hours. Thyroid Function Tests: No results for input(s): TSH, T4TOTAL, FREET4, T3FREE, THYROIDAB in the last 72 hours. Anemia Panel: Recent Labs    08/10/19 1704  FERRITIN 954*   Urine analysis:  Component Value Date/Time   COLORURINE YELLOW (A) 08/10/2019 1152   APPEARANCEUR CLOUDY (A) 08/10/2019 1152   LABSPEC 1.012 08/10/2019 1152   PHURINE 5.0 08/10/2019 1152   GLUCOSEU NEGATIVE 08/10/2019 1152   HGBUR NEGATIVE 08/10/2019 1152   BILIRUBINUR NEGATIVE 08/10/2019 1152   KETONESUR NEGATIVE 08/10/2019 1152   PROTEINUR 30 (A) 08/10/2019 1152   NITRITE NEGATIVE 08/10/2019 1152   LEUKOCYTESUR LARGE (A) 08/10/2019 1152    Radiological Exams on Admission: DG Chest 2 View  Result Date: 08/10/2019 CLINICAL DATA:  69 year old female with history of weakness since Christmas 2020. EXAM: CHEST - 2 VIEW COMPARISON:  Chest x-ray 10/24/2017. FINDINGS: Patchy ill-defined interstitial prominence and airspace disease noted throughout the lungs bilaterally, asymmetrically distributed, most severe in the periphery of the right mid to lower lung, concerning for multilobar bilateral pneumonia. No pleural effusions. No evidence of pulmonary edema. Heart size is borderline enlarged. Upper mediastinal contours are within normal limits. Aortic atherosclerosis. Left-sided pacemaker device in place with lead tips projecting over the expected location of the right atrium and right ventricular apex. IMPRESSION: 1. Multilobar bilateral pneumonia, concerning for possible atypical infection such as viral pneumonia. 2. Aortic atherosclerosis. Electronically Signed   By: Vinnie Langton M.D.   On: 08/10/2019 12:40   CUP PACEART REMOTE DEVICE CHECK  Result Date: 08/10/2019 Scheduled remote reviewed.  Normal device function.  Decrease noted in pt activity over last week. Next remote 91 days.  [place]   EKG: Independently reviewed. AV dual paced rhythm. ECHO - 07/07/18 - EF 50-55%, mild MR  Assessment/Plan Active  Problems:   Pneumonia due to COVID-19 virus   Type 2 diabetes with nephropathy (HCC)   Benign hypertension with CKD (chronic kidney disease) stage III (HCC)   Hypothyroidism   Normocytic anemia  (please populate well all problems here in Problem List. (For example, if patient is on BP meds at home and you resume or decide to hold them, it is a problem that needs to be her. Same for CAD, COPD, HLD and so on)   1. Covid pneumonia - patient is elderly with multiple comorbidities and at high risk for progressing to severe disease. In ED Remdesivir initiated and steroids. Plan Med/surg admit  Continue Remdesivir  Decadron 6 mg daily  Actemra - very high inflammatory markers with marked CXR changes. Mild UTI noted   And benefit outweighs risk  Oxygen to keep O2 sats >90%  2. DM - patient takes trulicity weekly. Plan Will give weekly dose of trulicity tomorrow - her usual day  Glycemic protocol with SS  3. Hypothyroidism - continue home meds  4. Hyperuricemia - continue home meds  5. Anemia - stable  6. Renal - CKD III now with increased creatinine - most likely prerenal Plan Gentle hydration given h/o poor po intake.  7. UTI - mild UTI w/o leukocytosis. Received 1 g Rocephin in ED Plan cipro 250 mg daily x 3 days.   DVT prophylaxis: eliquis (Lovenox/Heparin/SCD's/anticoagulated/None (if comfort care) Code Status: full code (Full/Partial (specify details) Family Communication: Called son - went to voicemail. ED-MD had given full report to daughter -in-law (Specify name, relationship. Do not write "discussed with patient". Specify tel # if discussed over the phone) Disposition Plan: home when medically stable (specify when and where you expect patient to be discharged) Consults called: none (with names) Admission status: Inpatient (inpatient / obs / tele / medical floor / SDU)  Examiner PPE: full PPE employed: double gloved, gowned, N-95 mask, surgical cover mask,  eye protection.  Donned and doffed per protocol  Adella Hare MD [facility] Pager [phone]  If 7PM-7AM, please contact night-coverage [email or web address] Password [facility]  08/10/2019, 9:33 PM

## 2019-08-10 NOTE — ED Notes (Signed)
Patient calling out stating that she is ready to leave. When this RN entered patient's room patient states "I've called my daughter and she is coming to pick me up. I don't want to stay, I feel better and am ready to go home." This RN explained to patient that she had antibiotics and fluids ordered due to infection and asked if she was warmer if she would stay for those. Patient refusing at this time. Agreeable to wait to talk to MD prior to leaving. Dr. Joan Mayans made aware.

## 2019-08-11 ENCOUNTER — Encounter: Payer: Self-pay | Admitting: Internal Medicine

## 2019-08-11 ENCOUNTER — Other Ambulatory Visit: Payer: Self-pay

## 2019-08-11 DIAGNOSIS — E1122 Type 2 diabetes mellitus with diabetic chronic kidney disease: Secondary | ICD-10-CM

## 2019-08-11 DIAGNOSIS — N3000 Acute cystitis without hematuria: Secondary | ICD-10-CM

## 2019-08-11 DIAGNOSIS — E785 Hyperlipidemia, unspecified: Secondary | ICD-10-CM

## 2019-08-11 DIAGNOSIS — I10 Essential (primary) hypertension: Secondary | ICD-10-CM

## 2019-08-11 DIAGNOSIS — E1169 Type 2 diabetes mellitus with other specified complication: Secondary | ICD-10-CM

## 2019-08-11 DIAGNOSIS — N183 Chronic kidney disease, stage 3 unspecified: Secondary | ICD-10-CM

## 2019-08-11 DIAGNOSIS — J9601 Acute respiratory failure with hypoxia: Secondary | ICD-10-CM

## 2019-08-11 DIAGNOSIS — I4819 Other persistent atrial fibrillation: Secondary | ICD-10-CM

## 2019-08-11 LAB — CBC WITH DIFFERENTIAL/PLATELET
Abs Immature Granulocytes: 0.01 10*3/uL (ref 0.00–0.07)
Basophils Absolute: 0 10*3/uL (ref 0.0–0.1)
Basophils Relative: 0 %
Eosinophils Absolute: 0 10*3/uL (ref 0.0–0.5)
Eosinophils Relative: 0 %
HCT: 30.1 % — ABNORMAL LOW (ref 36.0–46.0)
Hemoglobin: 10 g/dL — ABNORMAL LOW (ref 12.0–15.0)
Immature Granulocytes: 0 %
Lymphocytes Relative: 17 %
Lymphs Abs: 0.6 10*3/uL — ABNORMAL LOW (ref 0.7–4.0)
MCH: 30 pg (ref 26.0–34.0)
MCHC: 33.2 g/dL (ref 30.0–36.0)
MCV: 90.4 fL (ref 80.0–100.0)
Monocytes Absolute: 0.1 10*3/uL (ref 0.1–1.0)
Monocytes Relative: 2 %
Neutro Abs: 2.7 10*3/uL (ref 1.7–7.7)
Neutrophils Relative %: 81 %
Platelets: 255 10*3/uL (ref 150–400)
RBC: 3.33 MIL/uL — ABNORMAL LOW (ref 3.87–5.11)
RDW: 14.7 % (ref 11.5–15.5)
Smear Review: NORMAL
WBC: 3.4 10*3/uL — ABNORMAL LOW (ref 4.0–10.5)
nRBC: 0 % (ref 0.0–0.2)

## 2019-08-11 LAB — COMPREHENSIVE METABOLIC PANEL
ALT: 71 U/L — ABNORMAL HIGH (ref 0–44)
AST: 56 U/L — ABNORMAL HIGH (ref 15–41)
Albumin: 3.3 g/dL — ABNORMAL LOW (ref 3.5–5.0)
Alkaline Phosphatase: 64 U/L (ref 38–126)
Anion gap: 11 (ref 5–15)
BUN: 37 mg/dL — ABNORMAL HIGH (ref 8–23)
CO2: 24 mmol/L (ref 22–32)
Calcium: 8.8 mg/dL — ABNORMAL LOW (ref 8.9–10.3)
Chloride: 102 mmol/L (ref 98–111)
Creatinine, Ser: 1.74 mg/dL — ABNORMAL HIGH (ref 0.44–1.00)
GFR calc Af Amer: 34 mL/min — ABNORMAL LOW (ref >60)
GFR calc non Af Amer: 29 mL/min — ABNORMAL LOW (ref >60)
Glucose, Bld: 270 mg/dL — ABNORMAL HIGH (ref 70–99)
Potassium: 3.3 mmol/L — ABNORMAL LOW (ref 3.5–5.1)
Sodium: 137 mmol/L (ref 135–145)
Total Bilirubin: 0.6 mg/dL (ref 0.3–1.2)
Total Protein: 7 g/dL (ref 6.5–8.1)

## 2019-08-11 LAB — GLUCOSE, CAPILLARY
Glucose-Capillary: 371 mg/dL — ABNORMAL HIGH (ref 70–99)
Glucose-Capillary: 323 mg/dL — ABNORMAL HIGH (ref 70–99)

## 2019-08-11 LAB — HEMOGLOBIN A1C
Hgb A1c MFr Bld: 7.2 % — ABNORMAL HIGH (ref 4.8–5.6)
Mean Plasma Glucose: 159.94 mg/dL

## 2019-08-11 LAB — C-REACTIVE PROTEIN: CRP: 11.1 mg/dL — ABNORMAL HIGH (ref <1.0)

## 2019-08-11 MED ORDER — POTASSIUM CHLORIDE CRYS ER 20 MEQ PO TBCR
40.0000 meq | EXTENDED_RELEASE_TABLET | Freq: Once | ORAL | Status: AC
  Administered 2019-08-11: 10:00:00 40 meq via ORAL
  Filled 2019-08-11: qty 2

## 2019-08-11 MED ORDER — ZINC SULFATE 220 (50 ZN) MG PO CAPS
220.0000 mg | ORAL_CAPSULE | Freq: Every day | ORAL | Status: DC
  Administered 2019-08-11 – 2019-08-14 (×4): 220 mg via ORAL
  Filled 2019-08-11 (×3): qty 1

## 2019-08-11 MED ORDER — INSULIN ASPART 100 UNIT/ML ~~LOC~~ SOLN
0.0000 [IU] | Freq: Three times a day (TID) | SUBCUTANEOUS | Status: DC
  Administered 2019-08-11: 15 [IU] via SUBCUTANEOUS
  Administered 2019-08-12: 5 [IU] via SUBCUTANEOUS
  Administered 2019-08-12 – 2019-08-13 (×4): 8 [IU] via SUBCUTANEOUS
  Filled 2019-08-11 (×5): qty 1

## 2019-08-11 MED ORDER — INSULIN ASPART 100 UNIT/ML ~~LOC~~ SOLN
0.0000 [IU] | Freq: Every day | SUBCUTANEOUS | Status: DC
  Administered 2019-08-11: 4 [IU] via SUBCUTANEOUS
  Administered 2019-08-12: 5 [IU] via SUBCUTANEOUS
  Filled 2019-08-11 (×2): qty 1

## 2019-08-11 MED ORDER — DEXAMETHASONE SODIUM PHOSPHATE 4 MG/ML IJ SOLN
6.0000 mg | INTRAMUSCULAR | Status: DC
  Administered 2019-08-11 – 2019-08-13 (×3): 6 mg via INTRAVENOUS
  Filled 2019-08-11 (×3): qty 2

## 2019-08-11 MED ORDER — ASCORBIC ACID 500 MG PO TABS
500.0000 mg | ORAL_TABLET | Freq: Every day | ORAL | Status: DC
  Administered 2019-08-11 – 2019-08-13 (×3): 500 mg via ORAL
  Filled 2019-08-11 (×2): qty 1

## 2019-08-11 MED ORDER — IPRATROPIUM-ALBUTEROL 20-100 MCG/ACT IN AERS
2.0000 | INHALATION_SPRAY | Freq: Four times a day (QID) | RESPIRATORY_TRACT | Status: DC
  Administered 2019-08-11 – 2019-08-14 (×12): 2 via RESPIRATORY_TRACT
  Filled 2019-08-11: qty 4

## 2019-08-11 NOTE — ED Notes (Signed)
ED TO INPATIENT HANDOFF REPORT  ED Nurse Name and Phone #:  Dorothyann Gibbs  37   S Name/Age/Gender Renee Boyd 69 y.o. female Room/Bed: ED30A/ED30A  Code Status   Code Status: Full Code  Home/SNF/Other Home Patient oriented to: self, place, time and situation Is this baseline? Yes   Triage Complete: Triage complete  Chief Complaint Pneumonia due to COVID-19 virus [U07.1, J12.82]  Triage Note Pt states she thinks her hgb is low because she has been feeling weak since Christmas. Was told to take iron pills per dr but it caused constipation so she stopped them. Denies fever or cough. Sats 91% on RA in triage, states "now that I think about it, I am feeling sob." Appears in no distress at this time.     Allergies Allergies  Allergen Reactions  . Morphine And Related Itching  . Percocet [Oxycodone-Acetaminophen] Other (See Comments)    Hallucinations     Level of Care/Admitting Diagnosis ED Disposition    ED Disposition Condition Comment   Admit  Hospital Area: [facility] [[id number]]  Level of Care: Med-Surg [16]  Covid Evaluation: Confirmed COVID Positive  Diagnosis: Pneumonia due to COVID-19 virus [[id number]]  Admitting Physician: Neena Rhymes [[id number]]  Attending Physician: Adella Hare E [[id number]]  Estimated length of stay: 5 - 7 days  Certification:: I certify this patient will need inpatient services for at least 2 midnights       B Medical/Surgery History Past Medical History:  Diagnosis Date  . Complete heart block ([place])    a. s/p MDT PPM 2006 with device generator replacement 2014; b. followed by Dr. Caryl Comes  . Diabetes ([place])   . GERD (gastroesophageal reflux disease)   . Hypertension   . Hypothyroid   . Pacemaker   . Persistent atrial fibrillation ([facility])    a. on Eliquis; b. CHADS2VASc 6 (HTN. age x 1, DM, TIA x 2, female); c. s/p DCCV 05/28/18  . Pulmonary hypertension ([place])    a. echo 2017: EF of 60-65%, no RWMA, mildly dilated  left atrium, RVSF normal, PASP 64 mmHg  . TIA (transient ischemic attack)    Past Surgical History:  Procedure Laterality Date  . ABDOMINAL HYSTERECTOMY    . CARDIOVERSION N/A 05/28/2018   Procedure: CARDIOVERSION;  Surgeon: Minna Merritts, MD;  Location: [facility] ORS;  Service: Cardiovascular;  Laterality: N/A;  . CHOLECYSTECTOMY    . TONSILLECTOMY    . TUBAL LIGATION       A IV Location/Drains/Wounds Patient Lines/Drains/Airways Status   Active Line/Drains/Airways    Name:   Placement date:   Placement time:   Site:   Days:   Peripheral IV 08/10/19 Right Forearm   08/10/19    1652    Forearm   1          Intake/Output Last 24 hours  Intake/Output Summary (Last 24 hours) at 08/11/2019 0047 Last data filed at 08/10/2019 2154 Gross per 24 hour  Intake 850 ml  Output --  Net 850 ml    Labs/Imaging Results for orders placed or performed during the hospital encounter of 08/10/19 (from the past 48 hour(s))  Basic metabolic panel     Status: Abnormal   Collection Time: 08/10/19 11:52 AM  Result Value Ref Range   Sodium 134 (L) 135 - 145 mmol/L   Potassium 3.3 (L) 3.5 - 5.1 mmol/L   Chloride 98 98 - 111 mmol/L   CO2 21 (L) 22 - 32 mmol/L   Glucose,  Bld 105 (H) 70 - 99 mg/dL   BUN 32 (H) 8 - 23 mg/dL   Creatinine, Ser 2.02 (H) 0.44 - 1.00 mg/dL   Calcium 9.5 8.9 - 10.3 mg/dL   GFR calc non Af Amer 24 (L) >60 mL/min   GFR calc Af Amer 28 (L) >60 mL/min   Anion gap 15 5 - 15    Comment: Performed at [facility], [address] [place], [place] [address]  CBC     Status: Abnormal   Collection Time: 08/10/19 11:52 AM  Result Value Ref Range   WBC 5.5 4.0 - 10.5 K/uL   RBC 3.89 3.87 - 5.11 MIL/uL   Hemoglobin 11.5 (L) 12.0 - 15.0 g/dL   HCT 35.2 (L) 36.0 - 46.0 %   MCV 90.5 80.0 - 100.0 fL   MCH 29.6 26.0 - 34.0 pg   MCHC 32.7 30.0 - 36.0 g/dL   RDW 14.6 11.5 - 15.5 %   Platelets 276 150 - 400 K/uL   nRBC 0.0 0.0 - 0.2 %    Comment: Performed at [facility], [address] [place], [place] [address]  Urinalysis, Complete w Microscopic     Status: Abnormal   Collection Time: 08/10/19 11:52 AM  Result Value Ref Range   Color, Urine YELLOW (A) YELLOW   APPearance CLOUDY (A) CLEAR   Specific Gravity, Urine 1.012 1.005 - 1.030   pH 5.0 5.0 - 8.0   Glucose, UA NEGATIVE NEGATIVE mg/dL   Hgb urine dipstick NEGATIVE NEGATIVE   Bilirubin Urine NEGATIVE NEGATIVE   Ketones, ur NEGATIVE NEGATIVE mg/dL   Protein, ur 30 (A) NEGATIVE mg/dL   Nitrite NEGATIVE NEGATIVE   Leukocytes,Ua LARGE (A) NEGATIVE   RBC / HPF 6-10 0 - 5 RBC/hpf   WBC, UA 21-50 0 - 5 WBC/hpf   Bacteria, UA RARE (A) NONE SEEN   Squamous Epithelial / LPF 6-10 0 - 5   WBC Clumps PRESENT    Mucus PRESENT    Hyaline Casts, UA PRESENT     Comment: Performed at [facility], [address], [place], [place] [address]  ABO/Rh     Status: None   Collection Time: 08/10/19 11:52 AM  Result Value Ref Range   ABO/RH(D)      A POS Performed at [facility], [address] [place], [place] [address]   C-reactive protein     Status: Abnormal   Collection Time: 08/10/19  5:04 PM  Result Value Ref Range   CRP 11.4 (H) <1.0 mg/dL    Comment: Performed at [facility] [address] [place], [place] [address]  Ferritin (Iron Binding Protein)     Status: Abnormal   Collection Time: 08/10/19  5:04 PM  Result Value Ref Range   Ferritin 954 (H) 11 - 307 ng/mL    Comment: Performed at [facility], [address] [place], [place] [address]  Lactate dehydrogenase     Status: Abnormal   Collection Time: 08/10/19  5:04 PM  Result Value Ref Range   LDH 343 (H) 98 - 192 U/L    Comment: Performed at [facility], [address] [place], [place] [address]  Procalcitonin     Status: None   Collection Time: 08/10/19  5:04 PM  Result Value Ref Range   Procalcitonin 0.29 ng/mL    Comment:        Interpretation: PCT (Procalcitonin) <=  0.5 ng/mL: Systemic infection (sepsis) is  not likely. Local bacterial infection is possible. (NOTE)       Sepsis PCT Algorithm           Lower Respiratory Tract                                      Infection PCT Algorithm    ----------------------------     ----------------------------         PCT < 0.25 ng/mL                PCT < 0.10 ng/mL         Strongly encourage             Strongly discourage   discontinuation of antibiotics    initiation of antibiotics    ----------------------------     -----------------------------       PCT 0.25 - 0.50 ng/mL            PCT 0.10 - 0.25 ng/mL               OR       >80% decrease in PCT            Discourage initiation of                                            antibiotics      Encourage discontinuation           of antibiotics    ----------------------------     -----------------------------         PCT >= 0.50 ng/mL              PCT 0.26 - 0.50 ng/mL               AND        <80% decrease in PCT             Encourage initiation of                                             antibiotics       Encourage continuation           of antibiotics    ----------------------------     -----------------------------        PCT >= 0.50 ng/mL                  PCT > 0.50 ng/mL               AND         increase in PCT                  Strongly encourage                                      initiation of antibiotics    Strongly encourage escalation           of antibiotics                                     -----------------------------  PCT <= 0.25 ng/mL                                                 OR                                        > 80% decrease in PCT                                     Discontinue / Do not initiate                                             antibiotics Performed at [facility], [address] [place], [place] [address]   Respiratory Panel by RT PCR (Flu A&B,  Covid) - Nasopharyngeal Swab     Status: Abnormal   Collection Time: 08/10/19  5:04 PM   Specimen: Nasopharyngeal Swab  Result Value Ref Range   SARS Coronavirus 2 by RT PCR POSITIVE (A) NEGATIVE    Comment: RESULT CALLED TO, READ BACK BY AND VERIFIED WITH: LAURA CATES @1839  08/10/19 AKT (NOTE) SARS-CoV-2 target nucleic acids are DETECTED. SARS-CoV-2 RNA is generally detectable in upper respiratory specimens  during the acute phase of infection. Positive results are indicative of the presence of the identified virus, but do not rule out bacterial infection or co-infection with other pathogens not detected by the test. Clinical correlation with patient history and other diagnostic information is necessary to determine patient infection status. The expected result is Negative. Fact Sheet for Patients:  PinkCheek.be Fact Sheet for Healthcare Providers: GravelBags.it This test is not yet approved or cleared by the [place] FDA and  has been authorized for detection and/or diagnosis of SARS-CoV-2 by FDA under an Emergency Use Authorization (EUA).  This EUA will remain in effect (meaning this test can be used) for the  duration of  the COVID-19 declaration under Section 564(b)(1) of the Act, 21 U.S.C. section 360bbb-3(b)(1), unless the authorization is terminated or revoked sooner.    Influenza A by PCR NEGATIVE NEGATIVE   Influenza B by PCR NEGATIVE NEGATIVE    Comment: (NOTE) The Xpert Xpress SARS-CoV-2/FLU/RSV assay is intended as an aid in  the diagnosis of influenza from Nasopharyngeal swab specimens and  should not be used as a sole basis for treatment. Nasal washings and  aspirates are unacceptable for Xpert Xpress SARS-CoV-2/FLU/RSV  testing. Fact Sheet for Patients: PinkCheek.be Fact Sheet for Healthcare Providers: GravelBags.it This test is not yet approved  or cleared by the [place] FDA and  has been authorized for detection and/or diagnosis of SARS-CoV-2 by  FDA under an Emergency Use Authorization (EUA). This EUA will remain  in effect (meaning this test can be used) for the duration of the  Covid-19 declaration under Section 564(b)(1) of the Act, 21  U.S.C. section 360bbb-3(b)(1), unless the authorization is  terminated or revoked. Performed at [facility], [address] [place], [place] [address]   Hepatic function panel     Status: Abnormal   Collection Time: 08/10/19  5:04  PM  Result Value Ref Range   Total Protein 8.5 (H) 6.5 - 8.1 g/dL   Albumin 4.0 3.5 - 5.0 g/dL   AST 92 (H) 15 - 41 U/L   ALT 92 (H) 0 - 44 U/L   Alkaline Phosphatase 69 38 - 126 U/L   Total Bilirubin 0.9 0.3 - 1.2 mg/dL   Bilirubin, Direct 0.2 0.0 - 0.2 mg/dL   Indirect Bilirubin 0.7 0.3 - 0.9 mg/dL    Comment: Performed at [facility], [address], [place], [place] [address]   DG Chest 2 View  Result Date: 08/10/2019 CLINICAL DATA:  69 year old female with history of weakness since Christmas 2020. EXAM: CHEST - 2 VIEW COMPARISON:  Chest x-ray 10/24/2017. FINDINGS: Patchy ill-defined interstitial prominence and airspace disease noted throughout the lungs bilaterally, asymmetrically distributed, most severe in the periphery of the right mid to lower lung, concerning for multilobar bilateral pneumonia. No pleural effusions. No evidence of pulmonary edema. Heart size is borderline enlarged. Upper mediastinal contours are within normal limits. Aortic atherosclerosis. Left-sided pacemaker device in place with lead tips projecting over the expected location of the right atrium and right ventricular apex. IMPRESSION: 1. Multilobar bilateral pneumonia, concerning for possible atypical infection such as viral pneumonia. 2. Aortic atherosclerosis. Electronically Signed   By: Vinnie Langton M.D.   On: 08/10/2019 12:40   CUP PACEART REMOTE  DEVICE CHECK  Result Date: 08/10/2019 Scheduled remote reviewed.  Normal device function.  Decrease noted in pt activity over last week. Next remote 91 days.  [place]   Pending Labs [employer] (From admission, onward)    Start     Ordered   08/11/19 0500  CBC with Differential/Platelet  Daily,   STAT     08/10/19 2115   08/11/19 0500  Comprehensive metabolic panel  Daily,   STAT     08/10/19 2115   08/11/19 0500  C-reactive protein  Daily,   STAT     08/10/19 2115   08/10/19 2112  Influenza panel by PCR (type A & B)  Add-on,   AD     08/10/19 2115          Vitals/Pain Today's Vitals   08/10/19 1952 08/10/19 2100 08/10/19 2149 08/10/19 2344  BP:  140/80 119/60   Pulse:  60 60 (!) 59  Resp:  (!) 22 12   Temp:      TempSrc:      SpO2: 94% 97% 96% 90%  Weight:      Height:      PainSc:  Asleep      Isolation Precautions Airborne and Contact precautions  Medications Medications  remdesivir 200 mg in sodium chloride 0.9% 250 mL IVPB (0 mg Intravenous Stopped 08/10/19 2154)    Followed by  remdesivir 100 mg in sodium chloride 0.9 % 100 mL IVPB (has no administration in time range)  tocilizumab (ACTEMRA) 8 mg/kg = 616 mg in sodium chloride 0.9 % 100 mL infusion (has no administration in time range)  acetaminophen (TYLENOL) tablet 650 mg (has no administration in time range)  docusate sodium (COLACE) capsule 100 mg (100 mg Oral Given 08/11/19 0000)  traZODone (DESYREL) tablet 25 mg (has no administration in time range)  allopurinol (ZYLOPRIM) tablet 100 mg (100 mg Oral Given 08/11/19 0000)  traMADol (ULTRAM) tablet 50 mg (50 mg Oral Given 08/11/19 0000)  amiodarone (PACERONE) tablet 100 mg (100 mg Oral Given 08/11/19 0000)  atorvastatin (LIPITOR) tablet 20 mg (has no administration in time  range)  furosemide (LASIX) tablet 40 mg (has no administration in time range)  lisinopril (ZESTRIL) tablet 20 mg (has no administration in time range)  metoprolol succinate (TOPROL-XL) 24 hr  tablet 100 mg (has no administration in time range)  temazepam (RESTORIL) capsule 30 mg (has no administration in time range)  Dulaglutide SOPN 0.75 mg ( Subcutaneous Canceled Entry 08/10/19 2319)  levothyroxine (SYNTHROID) tablet 25 mcg (has no administration in time range)  pantoprazole (PROTONIX) EC tablet 40 mg (has no administration in time range)  apixaban (ELIQUIS) tablet 5 mg (has no administration in time range)  baclofen (LIORESAL) tablet 5 mg (5 mg Oral Given 08/11/19 0000)  0.45 % sodium chloride infusion ( Intravenous New Bag/Given 08/11/19 0015)  ciprofloxacin (CIPRO) tablet 250 mg (has no administration in time range)  amLODipine (NORVASC) tablet 10 mg (has no administration in time range)  sodium chloride flush (NS) 0.9 % injection 3 mL (3 mLs Intravenous Given 08/10/19 1654)  cefTRIAXone (ROCEPHIN) 1 g in sodium chloride 0.9 % 100 mL IVPB (0 g Intravenous Stopped 08/10/19 1800)  sodium chloride 0.9 % bolus 500 mL (0 mLs Intravenous Stopped 08/10/19 1800)  dexamethasone (DECADRON) injection 8 mg (8 mg Intravenous Given 08/10/19 1930)    Mobility walks Low fall risk   Focused Assessments n/a   R Recommendations: See Admitting Provider Note  Report given to:   Additional Notes: n/a

## 2019-08-11 NOTE — Progress Notes (Signed)
Patient ID: Renee Boyd, female   DOB: 30-Apr-1951, 69 y.o.   MRN: [id number] [facility] PROGRESS NOTE  MONAYE BLACKIE BHA:[id number] DOB: March 26, 1951 DOA: 08/10/2019 PCP: Olin Hauser, DO  HPI/Subjective: As per son, patient has not been feeling well since a little bit before Christmas.  He stated that he tried to get her to go see the doctor.  Patient states that she did have some loss of taste and some diarrhea and some shortness of breath.  She feels a little bit better today than she did yesterday.  Objective: Vitals:   08/11/19 0524 08/11/19 0815  BP: 128/61 132/68  Pulse: 61 72  Resp: 19 18  Temp: 97.8 F (36.6 C) 98.6 F (37 C)  SpO2: 97% 98%    Intake/Output Summary (Last 24 hours) at 08/11/2019 1314 Last data filed at 08/11/2019 0534 Gross per 24 hour  Intake 850.82 ml  Output --  Net 850.82 ml   Filed Weights   08/10/19 1151 08/11/19 0237  Weight: 77.1 kg 74.3 kg    ROS: Review of Systems  Constitutional: Negative for chills and fever.  Eyes: Negative for blurred vision.  Respiratory: Positive for cough and shortness of breath.   Cardiovascular: Negative for chest pain.  Gastrointestinal: Positive for diarrhea and nausea. Negative for abdominal pain, constipation and vomiting.  Genitourinary: Negative for dysuria.  Musculoskeletal: Negative for joint pain.  Neurological: Negative for dizziness and headaches.   Exam: Physical Exam  Constitutional: She is oriented to person, place, and time.  HENT:  Nose: No mucosal edema.  Mouth/Throat: No oropharyngeal exudate or posterior oropharyngeal edema.  Eyes: Conjunctivae, EOM and lids are normal.  Neck: Carotid bruit is not present.  Cardiovascular: S1 normal and S2 normal. Exam reveals no gallop.  No murmur heard. Pulses:      Dorsalis pedis pulses are 2+ on the right side and 2+ on the left side.  Respiratory: No respiratory distress. She has decreased breath sounds in the right lower field and  the left lower field. She has no wheezes. She has no rhonchi. She has no rales.  Coarse breath sounds bilaterally.  GI: Soft. Bowel sounds are normal. There is no abdominal tenderness.  Musculoskeletal:     Right ankle: No swelling.     Left ankle: No swelling.  Lymphadenopathy:    She has no cervical adenopathy.  Neurological: She is alert and oriented to person, place, and time. No cranial nerve deficit.  Skin: Skin is warm. No rash noted. Nails show no clubbing.  Psychiatric: She has a normal mood and affect.      Data Reviewed: Basic Metabolic Panel: Recent Labs  Lab 08/10/19 1152 08/11/19 0434  NA 134* 137  K 3.3* 3.3*  CL 98 102  CO2 21* 24  GLUCOSE 105* 270*  BUN 32* 37*  CREATININE 2.02* 1.74*  CALCIUM 9.5 8.8*   Liver Function Tests: Recent Labs  Lab 08/10/19 1704 08/11/19 0434  AST 92* 56*  ALT 92* 71*  ALKPHOS 69 64  BILITOT 0.9 0.6  PROT 8.5* 7.0  ALBUMIN 4.0 3.3*   CBC: Recent Labs  Lab 08/10/19 1152 08/11/19 0434  WBC 5.5 3.4*  NEUTROABS  --  2.7  HGB 11.5* 10.0*  HCT 35.2* 30.1*  MCV 90.5 90.4  PLT 276 255     Recent Results (from the past 240 hour(s))  Respiratory Panel by RT PCR (Flu A&B, Covid) - Nasopharyngeal Swab     Status: Abnormal   Collection  Time: 08/10/19  5:04 PM   Specimen: Nasopharyngeal Swab  Result Value Ref Range Status   SARS Coronavirus 2 by RT PCR POSITIVE (A) NEGATIVE Final    Comment: RESULT CALLED TO, READ BACK BY AND VERIFIED WITH: LAURA CATES @1839  08/10/19 AKT (NOTE) SARS-CoV-2 target nucleic acids are DETECTED. SARS-CoV-2 RNA is generally detectable in upper respiratory specimens  during the acute phase of infection. Positive results are indicative of the presence of the identified virus, but do not rule out bacterial infection or co-infection with other pathogens not detected by the test. Clinical correlation with patient history and other diagnostic information is necessary to determine  patient infection status. The expected result is Negative. Fact Sheet for Patients:  PinkCheek.be Fact Sheet for Healthcare Providers: GravelBags.it This test is not yet approved or cleared by the [place] FDA and  has been authorized for detection and/or diagnosis of SARS-CoV-2 by FDA under an Emergency Use Authorization (EUA).  This EUA will remain in effect (meaning this test can be used) for the  duration of  the COVID-19 declaration under Section 564(b)(1) of the Act, 21 U.S.C. section 360bbb-3(b)(1), unless the authorization is terminated or revoked sooner.    Influenza A by PCR NEGATIVE NEGATIVE Final   Influenza B by PCR NEGATIVE NEGATIVE Final    Comment: (NOTE) The Xpert Xpress SARS-CoV-2/FLU/RSV assay is intended as an aid in  the diagnosis of influenza from Nasopharyngeal swab specimens and  should not be used as a sole basis for treatment. Nasal washings and  aspirates are unacceptable for Xpert Xpress SARS-CoV-2/FLU/RSV  testing. Fact Sheet for Patients: PinkCheek.be Fact Sheet for Healthcare Providers: GravelBags.it This test is not yet approved or cleared by the [place] FDA and  has been authorized for detection and/or diagnosis of SARS-CoV-2 by  FDA under an Emergency Use Authorization (EUA). This EUA will remain  in effect (meaning this test can be used) for the duration of the  Covid-19 declaration under Section 564(b)(1) of the Act, 21  U.S.C. section 360bbb-3(b)(1), unless the authorization is  terminated or revoked. Performed at [facility], [address], [place], [place] [address]      Studies: DG Chest 2 View  Result Date: 08/10/2019 CLINICAL DATA:  69 year old female with history of weakness since Christmas 2020. EXAM: CHEST - 2 VIEW COMPARISON:  Chest x-ray 10/24/2017. FINDINGS: Patchy ill-defined interstitial  prominence and airspace disease noted throughout the lungs bilaterally, asymmetrically distributed, most severe in the periphery of the right mid to lower lung, concerning for multilobar bilateral pneumonia. No pleural effusions. No evidence of pulmonary edema. Heart size is borderline enlarged. Upper mediastinal contours are within normal limits. Aortic atherosclerosis. Left-sided pacemaker device in place with lead tips projecting over the expected location of the right atrium and right ventricular apex. IMPRESSION: 1. Multilobar bilateral pneumonia, concerning for possible atypical infection such as viral pneumonia. 2. Aortic atherosclerosis. Electronically Signed   By: Vinnie Langton M.D.   On: 08/10/2019 12:40   CUP PACEART REMOTE DEVICE CHECK  Result Date: 08/10/2019 Scheduled remote reviewed.  Normal device function.  Decrease noted in pt activity over last week. Next remote 91 days.  AM[place]   Scheduled Meds: . allopurinol  100 mg Oral Daily  . amiodarone  100 mg Oral BID  . amLODipine  10 mg Oral Daily  . apixaban  5 mg Oral BID  . atorvastatin  20 mg Oral Daily  . baclofen  5 mg Oral QHS  . ciprofloxacin  250  mg Oral Q breakfast  . dexamethasone (DECADRON) injection  6 mg Intravenous Q24H  . docusate sodium  100 mg Oral BID  . Dulaglutide  0.75 mg Subcutaneous Weekly  . furosemide  40 mg Oral Daily  . insulin aspart  0-15 Units Subcutaneous TID WC  . insulin aspart  0-5 Units Subcutaneous QHS  . levothyroxine  25 mcg Oral QAC breakfast  . lisinopril  20 mg Oral Daily  . metoprolol succinate  100 mg Oral Daily  . pantoprazole  40 mg Oral Daily  . traMADol  50 mg Oral BID   Continuous Infusions: . remdesivir 100 mg in NS 100 mL 100 mg (08/11/19 1046)    Assessment/Plan:  1. COVID-19 Multi lobar bilateral pneumonia.  Patient was given Actemra.  Patient on remdesivir given last night.  Tonight will be day 2.  Restart steroids Decadron 6 mg daily.  Start vitamin C and zinc.   Combivent inhaler prescribed.   2. Acute hypoxic respiratory failure.  Pulse ox 89% on room air.  Patient placed on 2 L of oxygen.  Incentive spirometer. 3. Type 2 diabetes mellitus with hyperlipidemia.  Continue atorvastatin.  Place on sliding scale.  Patient on dulaglutide insulin 4. Chronic kidney disease stage III.  Continue to monitor.  Discontinue IV fluids since creatinine looks like it is back to baseline. 5. UTI without hematuria.  I ordered a urine culture.  Patient looks like they were placed on Cipro. 6. Hypothyroidism unspecified on levothyroxine 7. Essential hypertension on lisinopril metoprolol and amlodipine 8. Persistent atrial fibrillation on metoprolol and amiodarone for rate control and Eliquis for anticoagulation.  Code Status:     Code Status Orders  (From admission, onward)         Start     Ordered   08/10/19 2111  Full code  Continuous     08/10/19 2115        Code Status History    Date Active Date Inactive Code Status Order ID Comments User Context   02/07/2016 1420 02/11/2016 1740 Full Code [id number]  Hower, Aaron Mose, MD ED   Advance Care Planning Activity    Advance Directive Documentation     Most Recent Value  Type of Advance Directive  Healthcare Power of [place]  Pre-existing out of facility DNR order (yellow form or pink MOST form)  --  "MOST" Form in Place?  --     Family Communication: Spoke with son and given update on the phone Disposition Plan: Day 2 of IV remdesivir tonight  Antibiotics:  Cipro  Time spent: 28 minutes  [place]

## 2019-08-11 NOTE — Progress Notes (Addendum)
Called patient designated family member son Mabeline Caras [phone] to give him an update on patient. All questions answered at time of call.

## 2019-08-11 NOTE — Consult Note (Addendum)
Remdesivir - Pharmacy Brief Note  SARS Coronavirus 2 by RT PCR: Positive 08/10/2019  Day 2 Remdesivir   O:  ALT: 71 CXR: Multilobar bilateral pneumonia, concerning for possible atypical infection such as viral pneumonia. SpO2: 89% on room air   A/P:  Remdesivir 200 mg IVPB once followed by 100 mg IVPB daily x 4 days.   Pernell Dupre, PharmD, BCPS Clinical Pharmacist 08/11/2019 9:01 AM

## 2019-08-11 NOTE — Progress Notes (Signed)
PPM remote 

## 2019-08-12 ENCOUNTER — Inpatient Hospital Stay: Payer: Medicare Other

## 2019-08-12 LAB — CBC WITH DIFFERENTIAL/PLATELET
Abs Immature Granulocytes: 0.07 10*3/uL (ref 0.00–0.07)
Basophils Absolute: 0 10*3/uL (ref 0.0–0.1)
Basophils Relative: 0 %
Eosinophils Absolute: 0 10*3/uL (ref 0.0–0.5)
Eosinophils Relative: 0 %
HCT: 29.2 % — ABNORMAL LOW (ref 36.0–46.0)
Hemoglobin: 9.5 g/dL — ABNORMAL LOW (ref 12.0–15.0)
Immature Granulocytes: 1 %
Lymphocytes Relative: 11 %
Lymphs Abs: 0.9 10*3/uL (ref 0.7–4.0)
MCH: 29.3 pg (ref 26.0–34.0)
MCHC: 32.5 g/dL (ref 30.0–36.0)
MCV: 90.1 fL (ref 80.0–100.0)
Monocytes Absolute: 0.5 10*3/uL (ref 0.1–1.0)
Monocytes Relative: 6 %
Neutro Abs: 6.9 10*3/uL (ref 1.7–7.7)
Neutrophils Relative %: 82 %
Platelets: 286 10*3/uL (ref 150–400)
RBC: 3.24 MIL/uL — ABNORMAL LOW (ref 3.87–5.11)
RDW: 14.7 % (ref 11.5–15.5)
Smear Review: NORMAL
WBC: 8.4 10*3/uL (ref 4.0–10.5)
nRBC: 0 % (ref 0.0–0.2)

## 2019-08-12 LAB — GLUCOSE, CAPILLARY
Glucose-Capillary: 257 mg/dL — ABNORMAL HIGH (ref 70–99)
Glucose-Capillary: 222 mg/dL — ABNORMAL HIGH (ref 70–99)
Glucose-Capillary: 251 mg/dL — ABNORMAL HIGH (ref 70–99)
Glucose-Capillary: 283 mg/dL — ABNORMAL HIGH (ref 70–99)
Glucose-Capillary: 352 mg/dL — ABNORMAL HIGH (ref 70–99)

## 2019-08-12 LAB — COMPREHENSIVE METABOLIC PANEL
ALT: 56 U/L — ABNORMAL HIGH (ref 0–44)
AST: 40 U/L (ref 15–41)
Albumin: 3.1 g/dL — ABNORMAL LOW (ref 3.5–5.0)
Alkaline Phosphatase: 60 U/L (ref 38–126)
Anion gap: 9 (ref 5–15)
BUN: 40 mg/dL — ABNORMAL HIGH (ref 8–23)
CO2: 23 mmol/L (ref 22–32)
Calcium: 9.4 mg/dL (ref 8.9–10.3)
Chloride: 106 mmol/L (ref 98–111)
Creatinine, Ser: 1.64 mg/dL — ABNORMAL HIGH (ref 0.44–1.00)
GFR calc Af Amer: 36 mL/min — ABNORMAL LOW (ref >60)
GFR calc non Af Amer: 31 mL/min — ABNORMAL LOW (ref >60)
Glucose, Bld: 288 mg/dL — ABNORMAL HIGH (ref 70–99)
Potassium: 3.8 mmol/L (ref 3.5–5.1)
Sodium: 138 mmol/L (ref 135–145)
Total Bilirubin: 0.3 mg/dL (ref 0.3–1.2)
Total Protein: 6.4 g/dL — ABNORMAL LOW (ref 6.5–8.1)

## 2019-08-12 LAB — C-REACTIVE PROTEIN: CRP: 8 mg/dL — ABNORMAL HIGH (ref <1.0)

## 2019-08-12 LAB — URINE CULTURE
Culture: NO GROWTH
Special Requests: NORMAL

## 2019-08-12 LAB — FERRITIN: Ferritin: 586 ng/mL — ABNORMAL HIGH (ref 11–307)

## 2019-08-12 IMAGING — DX DG CHEST 1V PORT
1 series · 1 of 1 positions shown · non-contrast
Comparison: Two days ago

CLINICAL DATA: Shortness of breath

EXAM:
PORTABLE CHEST 1 VIEW

[chest ap]
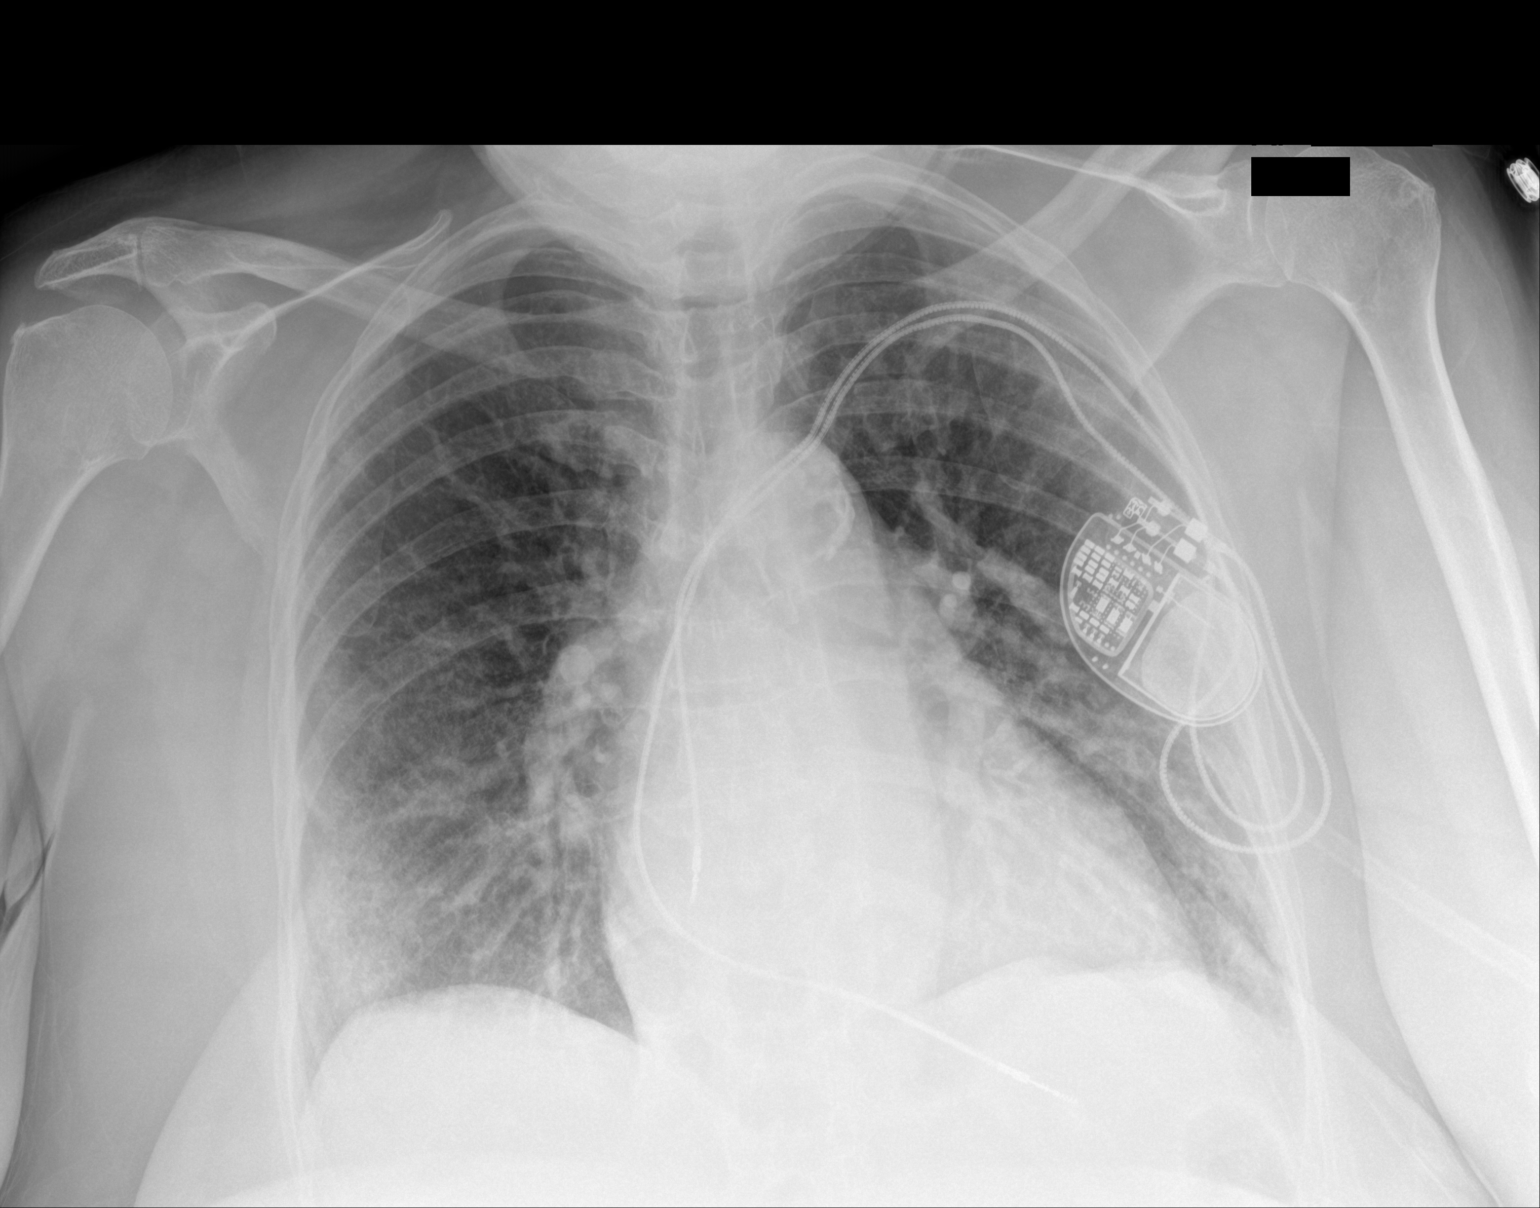

[1 of 1 positions shown; findings below may reference images not displayed]

FINDINGS: Hazy pulmonary infiltrate on both sides, better seen in the
subpleural right lower lung. No edema, effusion, or pneumothorax.
Normal heart size and mediastinal contours. Dual-chamber pacer leads
from the left.
IMPRESSION: Stable pneumonia.

## 2019-08-12 NOTE — Progress Notes (Signed)
Inpatient Diabetes Program Recommendations  AACE/ADA: New Consensus Statement on Inpatient Glycemic Control (2015)  Target Ranges:  Prepandial:   less than 140 mg/dL      Peak postprandial:   less than 180 mg/dL (1-2 hours)      Critically ill patients:  140 - 180 mg/dL   Lab Results  Component Value Date   GLUCAP 257 (H) 08/12/2019   HGBA1C 7.2 (H) 08/11/2019    Review of Glycemic Control Results for Renee Boyd, Renee Boyd (MRN [id number]) as of 08/12/2019 10:41  Ref. Range 08/11/2019 17:00 08/11/2019 21:13 08/12/2019 07:54  Glucose-Capillary Latest Ref Range: 70 - 99 mg/dL 371 (H) 323 (H) 257 (H)   Diabetes history: DM 2 Outpatient Diabetes medications:  Dulaglutide 0.75 weekly  Current orders for Inpatient glycemic control:  Novolog moderate tid with meals and HS Decadron 6 mg weekly  Inpatient Diabetes Program Recommendations:   Note blood sugars > goal.  Consider adding Levemir 6 units bid.  Also consider adding Novolog meal coverage 3 units tid with meals.   Thanks so much,  Adah Perl, RN, BC-ADM Inpatient Diabetes Coordinator Pager [phone] (8a-5p)

## 2019-08-12 NOTE — Progress Notes (Signed)
Patient ID: Renee Boyd, female   DOB: [birth year], 69 y.o.   MRN: [id number] [facility] PROGRESS NOTE  Renee Boyd XBD:[id number] DOB: [birth year] DOA: 08/10/2019 PCP: Renee Hauser, DO  HPI/Subjective: Patient feeling better today.  Some cough.  Some shortness of breath.  She states her sugars have been high.  Still have a little diarrhea.  Appetite a little bit better.  Objective: Vitals:   08/12/19 0021 08/12/19 0758  BP: 109/61 (!) 118/55  Pulse: 61 60  Resp: 18   Temp: 97.8 F (36.6 C) 98.7 F (37.1 C)  SpO2: 94% 100%    Intake/Output Summary (Last 24 hours) at 08/12/2019 1326 Last data filed at 08/12/2019 0600 Gross per 24 hour  Intake 110 ml  Output 700 ml  Net -590 ml   Filed Weights   08/10/19 1151 08/11/19 0237  Weight: 77.1 kg 74.3 kg    ROS: Review of Systems  Constitutional: Negative for chills and fever.  Eyes: Negative for blurred vision.  Respiratory: Positive for cough and shortness of breath.   Cardiovascular: Negative for chest pain.  Gastrointestinal: Positive for diarrhea and nausea. Negative for abdominal pain, constipation and vomiting.  Genitourinary: Negative for dysuria.  Musculoskeletal: Negative for joint pain.  Neurological: Negative for dizziness and headaches.   Exam: Physical Exam  Constitutional: She is oriented to person, place, and time.  HENT:  Nose: No mucosal edema.  Mouth/Throat: No oropharyngeal exudate or posterior oropharyngeal edema.  Eyes: Conjunctivae, EOM and lids are normal.  Neck: Carotid bruit is not present.  Cardiovascular: S1 normal and S2 normal. Exam reveals no gallop.  No murmur heard. Pulses:      Dorsalis pedis pulses are 2+ on the right side and 2+ on the left side.  Respiratory: No respiratory distress. She has decreased breath sounds in the right lower field and the left lower field. She has no wheezes. She has no rhonchi. She has no rales.  GI: Soft. Bowel sounds are normal. There is no  abdominal tenderness.  Musculoskeletal:     Right ankle: No swelling.     Left ankle: No swelling.  Lymphadenopathy:    She has no cervical adenopathy.  Neurological: She is alert and oriented to person, place, and time. No cranial nerve deficit.  Skin: Skin is warm. No rash noted. Nails show no clubbing.  Psychiatric: She has a normal mood and affect.      Data Reviewed: Basic Metabolic Panel: Recent Labs  Lab 08/10/19 1152 08/11/19 0434 08/12/19 0256  NA 134* 137 138  K 3.3* 3.3* 3.8  CL 98 102 106  CO2 21* 24 23  GLUCOSE 105* 270* 288*  BUN 32* 37* 40*  CREATININE 2.02* 1.74* 1.64*  CALCIUM 9.5 8.8* 9.4   Liver Function Tests: Recent Labs  Lab 08/10/19 1704 08/11/19 0434 08/12/19 0256  AST 92* 56* 40  ALT 92* 71* 56*  ALKPHOS 69 64 60  BILITOT 0.9 0.6 0.3  PROT 8.5* 7.0 6.4*  ALBUMIN 4.0 3.3* 3.1*   CBC: Recent Labs  Lab 08/10/19 1152 08/11/19 0434 08/12/19 0256  WBC 5.5 3.4* 8.4  NEUTROABS  --  2.7 6.9  HGB 11.5* 10.0* 9.5*  HCT 35.2* 30.1* 29.2*  MCV 90.5 90.4 90.1  PLT 276 255 286     Recent Results (from the past 240 hour(s))  Respiratory Panel by RT PCR (Flu A&B, Covid) - Nasopharyngeal Swab     Status: Abnormal   Collection Time: 08/10/19  5:04 PM  Specimen: Nasopharyngeal Swab  Result Value Ref Range Status   SARS Coronavirus 2 by RT PCR POSITIVE (A) NEGATIVE Final    Comment: RESULT CALLED TO, READ BACK BY AND VERIFIED WITH: LAURA CATES @1839  08/10/19 AKT (NOTE) SARS-CoV-2 target nucleic acids are DETECTED. SARS-CoV-2 RNA is generally detectable in upper respiratory specimens  during the acute phase of infection. Positive results are indicative of the presence of the identified virus, but do not rule out bacterial infection or co-infection with other pathogens not detected by the test. Clinical correlation with patient history and other diagnostic information is necessary to determine patient infection status. The expected result is  Negative. Fact Sheet for Patients:  PinkCheek.be Fact Sheet for Healthcare Providers: GravelBags.it This test is not yet approved or cleared by the [place] FDA and  has been authorized for detection and/or diagnosis of SARS-CoV-2 by FDA under an Emergency Use Authorization (EUA).  This EUA will remain in effect (meaning this test can be used) for the  duration of  the COVID-19 declaration under Section 564(b)(1) of the Act, 21 U.S.C. section 360bbb-3(b)(1), unless the authorization is terminated or revoked sooner.    Influenza A by PCR NEGATIVE NEGATIVE Final   Influenza B by PCR NEGATIVE NEGATIVE Final    Comment: (NOTE) The Xpert Xpress SARS-CoV-2/FLU/RSV assay is intended as an aid in  the diagnosis of influenza from Nasopharyngeal swab specimens and  should not be used as a sole basis for treatment. Nasal washings and  aspirates are unacceptable for Xpert Xpress SARS-CoV-2/FLU/RSV  testing. Fact Sheet for Patients: PinkCheek.be Fact Sheet for Healthcare Providers: GravelBags.it This test is not yet approved or cleared by the [place] FDA and  has been authorized for detection and/or diagnosis of SARS-CoV-2 by  FDA under an Emergency Use Authorization (EUA). This EUA will remain  in effect (meaning this test can be used) for the duration of the  Covid-19 declaration under Section 564(b)(1) of the Act, 21  U.S.C. section 360bbb-3(b)(1), unless the authorization is  terminated or revoked. Performed at [facility], [address] [place], [place] [address]   Urine Culture     Status: None   Collection Time: 08/11/19  9:10 AM   Specimen: Urine, Clean Catch  Result Value Ref Range Status   Specimen Description   Final    URINE, CLEAN CATCH Performed at [facility], [address], [place], [place] [address]    Special Requests    Final    Normal Performed at [facility], [address] [place], [place] [address]    Culture   Final    NO GROWTH Performed at [facility], [place] [address], [place], [place] [address]    Report Status 08/12/2019 FINAL  Final     Studies: Portable chest 1 View  Result Date: 08/12/2019 CLINICAL DATA:  Shortness of breath EXAM: PORTABLE CHEST 1 VIEW COMPARISON:  Two days ago FINDINGS: Hazy pulmonary infiltrate on both sides, better seen in the subpleural right lower lung. No edema, effusion, or pneumothorax. Normal heart size and mediastinal contours. Dual-chamber pacer leads from the left. IMPRESSION: Stable pneumonia. Electronically Signed   By: Monte Fantasia M.D.   On: 08/12/2019 04:40    Scheduled Meds: . allopurinol  100 mg Oral Daily  . amiodarone  100 mg Oral BID  . apixaban  5 mg Oral BID  . vitamin C  500 mg Oral Daily  . atorvastatin  20 mg Oral Daily  .  baclofen  5 mg Oral QHS  . dexamethasone (DECADRON) injection  6 mg Intravenous Q24H  . Dulaglutide  0.75 mg Subcutaneous Weekly  . furosemide  40 mg Oral Daily  . insulin aspart  0-15 Units Subcutaneous TID WC  . insulin aspart  0-5 Units Subcutaneous QHS  . Ipratropium-Albuterol  2 puff Inhalation Q6H  . levothyroxine  25 mcg Oral QAC breakfast  . lisinopril  20 mg Oral Daily  . metoprolol succinate  100 mg Oral Daily  . pantoprazole  40 mg Oral Daily  . traMADol  50 mg Oral BID  . zinc sulfate  220 mg Oral Daily   Continuous Infusions: . remdesivir 100 mg in NS 100 mL 100 mg (08/12/19 1036)    Assessment/Plan:  1. COVID-19 Multi lobar bilateral pneumonia.  Patient was given Actemra.  Patient on remdesivir (day 3 today) Decadron 6 mg daily.  Continue vitamin C, zinc and Combivent inhaler.   2. Acute hypoxic respiratory failure.  Pulse ox 89% on room air.  Patient tapered down to 1 L today.  We will just get a pulse ox on room air and see if we can get her off oxygen. 3. Type 2 diabetes  mellitus with hyperlipidemia.  Continue atorvastatin.  Placed on sliding scale.  Patient on dulaglutide insulin.  Sugars a little high with being placed on steroids. 4. Chronic kidney disease stage IIIb. continue to monitor.  5. Urine culture no growth.  Discontinue antibiotics. 6. Hypothyroidism unspecified on levothyroxine 7. Essential hypertension on lisinopril metoprolol and amlodipine 8. Persistent atrial fibrillation on metoprolol and amiodarone for rate control and Eliquis for anticoagulation.  Code Status:     Code Status Orders  (From admission, onward)         Start     Ordered   08/10/19 2111  Full code  Continuous     08/10/19 2115        Code Status History    Date Active Date Inactive Code Status Order ID Comments User Context   02/07/2016 1420 02/11/2016 1740 Full Code [id number]  Hower, Aaron Mose, MD ED   Advance Care Planning Activity    Advance Directive Documentation     Most Recent Value  Type of Advance Directive  Healthcare Power of Attorney  Pre-existing out of facility DNR order (yellow form or pink MOST form)  --  "MOST" Form in Place?  --     Family Communication: Spoke with son and given update on the phone Disposition Plan: Day 3 of IV remdesivir today.  Potential discharge on Saturday  Time spent: 27 minutes  [place]

## 2019-08-12 NOTE — Progress Notes (Signed)
Pt son Darnelle Maffucci updated on pt condition. He expressed his gratitude for our care of his mother. No questions at this time.

## 2019-08-13 DIAGNOSIS — J189 Pneumonia, unspecified organism: Secondary | ICD-10-CM

## 2019-08-13 LAB — COMPREHENSIVE METABOLIC PANEL
ALT: 57 U/L — ABNORMAL HIGH (ref 0–44)
AST: 37 U/L (ref 15–41)
Albumin: 3.3 g/dL — ABNORMAL LOW (ref 3.5–5.0)
Alkaline Phosphatase: 64 U/L (ref 38–126)
Anion gap: 13 (ref 5–15)
BUN: 34 mg/dL — ABNORMAL HIGH (ref 8–23)
CO2: 24 mmol/L (ref 22–32)
Calcium: 9.6 mg/dL (ref 8.9–10.3)
Chloride: 103 mmol/L (ref 98–111)
Creatinine, Ser: 1.44 mg/dL — ABNORMAL HIGH (ref 0.44–1.00)
GFR calc Af Amer: 43 mL/min — ABNORMAL LOW (ref >60)
GFR calc non Af Amer: 37 mL/min — ABNORMAL LOW (ref >60)
Glucose, Bld: 311 mg/dL — ABNORMAL HIGH (ref 70–99)
Potassium: 3.5 mmol/L (ref 3.5–5.1)
Sodium: 140 mmol/L (ref 135–145)
Total Bilirubin: 0.6 mg/dL (ref 0.3–1.2)
Total Protein: 6.9 g/dL (ref 6.5–8.1)

## 2019-08-13 LAB — CBC WITH DIFFERENTIAL/PLATELET
Abs Immature Granulocytes: 0.2 10*3/uL — ABNORMAL HIGH (ref 0.00–0.07)
Basophils Absolute: 0 10*3/uL (ref 0.0–0.1)
Basophils Relative: 0 %
Eosinophils Absolute: 0 10*3/uL (ref 0.0–0.5)
Eosinophils Relative: 0 %
HCT: 31.6 % — ABNORMAL LOW (ref 36.0–46.0)
Hemoglobin: 10.2 g/dL — ABNORMAL LOW (ref 12.0–15.0)
Immature Granulocytes: 2 %
Lymphocytes Relative: 9 %
Lymphs Abs: 1.1 10*3/uL (ref 0.7–4.0)
MCH: 29.4 pg (ref 26.0–34.0)
MCHC: 32.3 g/dL (ref 30.0–36.0)
MCV: 91.1 fL (ref 80.0–100.0)
Monocytes Absolute: 0.7 10*3/uL (ref 0.1–1.0)
Monocytes Relative: 6 %
Neutro Abs: 10.1 10*3/uL — ABNORMAL HIGH (ref 1.7–7.7)
Neutrophils Relative %: 83 %
Platelets: 301 10*3/uL (ref 150–400)
RBC: 3.47 MIL/uL — ABNORMAL LOW (ref 3.87–5.11)
RDW: 15 % (ref 11.5–15.5)
WBC: 12.1 10*3/uL — ABNORMAL HIGH (ref 4.0–10.5)
nRBC: 0 % (ref 0.0–0.2)

## 2019-08-13 LAB — GLUCOSE, CAPILLARY
Glucose-Capillary: 267 mg/dL — ABNORMAL HIGH (ref 70–99)
Glucose-Capillary: 256 mg/dL — ABNORMAL HIGH (ref 70–99)
Glucose-Capillary: 277 mg/dL — ABNORMAL HIGH (ref 70–99)
Glucose-Capillary: 368 mg/dL — ABNORMAL HIGH (ref 70–99)

## 2019-08-13 LAB — FERRITIN: Ferritin: 601 ng/mL — ABNORMAL HIGH (ref 11–307)

## 2019-08-13 LAB — C-REACTIVE PROTEIN: CRP: 4.2 mg/dL — ABNORMAL HIGH (ref <1.0)

## 2019-08-13 MED ORDER — INSULIN ASPART 100 UNIT/ML ~~LOC~~ SOLN
3.0000 [IU] | Freq: Three times a day (TID) | SUBCUTANEOUS | Status: DC
  Administered 2019-08-13 – 2019-08-14 (×2): 3 [IU] via SUBCUTANEOUS
  Filled 2019-08-13 (×2): qty 1

## 2019-08-13 MED ORDER — INSULIN ASPART 100 UNIT/ML ~~LOC~~ SOLN
0.0000 [IU] | Freq: Every day | SUBCUTANEOUS | Status: DC
  Administered 2019-08-13: 22:00:00 5 [IU] via SUBCUTANEOUS
  Filled 2019-08-13: qty 1

## 2019-08-13 MED ORDER — INSULIN DETEMIR 100 UNIT/ML ~~LOC~~ SOLN
6.0000 [IU] | Freq: Every day | SUBCUTANEOUS | Status: DC
  Administered 2019-08-13: 6 [IU] via SUBCUTANEOUS
  Filled 2019-08-13 (×2): qty 0.06

## 2019-08-13 MED ORDER — INSULIN ASPART 100 UNIT/ML ~~LOC~~ SOLN
0.0000 [IU] | Freq: Three times a day (TID) | SUBCUTANEOUS | Status: DC
  Administered 2019-08-13: 11 [IU] via SUBCUTANEOUS
  Administered 2019-08-14: 08:00:00 7 [IU] via SUBCUTANEOUS
  Filled 2019-08-13 (×2): qty 1

## 2019-08-13 NOTE — Care Management Important Message (Signed)
Important Message  Patient Details  Name: Renee Boyd MRN: [id number] Date of Birth: October 04, 1950   Medicare Important Message Given:  Yes   PC placed to patient to discuss.      Shelbie Ammons, RN 08/13/2019, 3:39 PM

## 2019-08-13 NOTE — Evaluation (Signed)
Physical Therapy Evaluation Patient Details Name: Renee Boyd MRN: [id number] DOB: 03/31/1951 Today's Date: 08/13/2019   History of Present Illness  Patient is 69 yo female that presented to ED for fatigue, generalized weakness, SOB, decreased appetite and changes in taste. Covid+. PMH of pacemaker, DM, HTN, hypothyroidsm, afib, TIA, CKDIII    Clinical Impression  Patient alert, oriented, very verbose during the session. The patient reported living in an apartment alone, but has several people nearby (sounds like an apartment in a large home). Previously independent at baseline, son may assist occasionally.   Oxygen status monitored throughout session, pt on room air. SpO2>90% majority of session. Did experience mild drops into mid 80s with physical activity and speaking, recovered with cued for PLB and standing rest breaks. The patient was able to sit EOB for several minutes with good balance, sit <> Stand multiple times with mod I (handheld assist provided though not required), and ambulated ~149ft total with no AD and with handheld assist. Pt instructed to utilize [facility] at home for safety. The patient also informed this Probation officer that she has been up to the bathroom independently in her room, has been brushed her teeth, cleaned her room, etc.  Overall the patient demonstrated mild deficits (see "PT Problem List") that impede the patient's functional abilities, safety, and mobility and would benefit from skilled PT intervention. Recommendation is HHPT with intermittent supervision.     Follow Up Recommendations Home health PT;Supervision - Intermittent    Equipment Recommendations  None recommended by PT;Other (comment)(Pt has SPC at home)    Recommendations for Other Services       Precautions / Restrictions Precautions Precautions: Fall;ICD/Pacemaker Restrictions Weight Bearing Restrictions: No      Mobility  Bed Mobility Overal bed mobility: Modified Independent                 Transfers Overall transfer level: Modified independent Equipment used: None;1 person hand held assist                Ambulation/Gait Ambulation/Gait assistance: Supervision;Min guard Gait Distance (Feet): 120 Feet Assistive device: 1 person hand held assist;None   Gait velocity: decreased   General Gait Details: Patient able to ambulate without assistance, but more comfortable with handheld assist. Educated on use of [facility]  Stairs            Wheelchair Mobility    Modified Rankin (Stroke Patients Only)       Balance Overall balance assessment: Needs assistance Sitting-balance support: Feet supported Sitting balance-Leahy Scale: Good       Standing balance-Leahy Scale: Fair                               Pertinent Vitals/Pain Pain Assessment: No/denies pain    Home Living Family/patient expects to be discharged to:: Private residence Living Arrangements: Alone Available Help at Discharge: Family;Friend(s);Available PRN/intermittently Type of Home: Apartment Home Access: Level entry     Home Layout: One level Home Equipment: Cane - single point      Prior Function Level of Independence: Independent               Hand Dominance   Dominant Hand: Right    Extremity/Trunk Assessment   Upper Extremity Assessment Upper Extremity Assessment: Generalized weakness    Lower Extremity Assessment Lower Extremity Assessment: Generalized weakness(grossly 4-/5)    Cervical / Trunk Assessment Cervical / Trunk Assessment: Normal  Communication  Communication: No difficulties  Cognition Arousal/Alertness: Awake/alert Behavior During Therapy: WFL for tasks assessed/performed Overall Cognitive Status: Within Functional Limits for tasks assessed                                        General Comments      Exercises Other Exercises Other Exercises: Pt able to sit EOB for several minutes with good sitting balance,  very verbose. spO2 >90% on room air Other Exercises: Pt educated on PLB, pt with fair carryover   Assessment/Plan    PT Assessment Patient needs continued PT services  PT Problem List Decreased mobility;Decreased activity tolerance;Decreased balance       PT Treatment Interventions DME instruction;Therapeutic exercise;Gait training;Balance training;Stair training;Therapeutic activities;Patient/family education;Neuromuscular re-education    PT Goals (Current goals can be found in the Care Plan section)  Acute Rehab PT Goals Patient Stated Goal: to go home PT Goal Formulation: With patient Time For Goal Achievement: 08/27/19 Potential to Achieve Goals: Good    Frequency Min 2X/week   Barriers to discharge        Co-evaluation               AM-PAC PT "6 Clicks" Mobility  Outcome Measure Help needed turning from your back to your side while in a flat bed without using bedrails?: None Help needed moving from lying on your back to sitting on the side of a flat bed without using bedrails?: None Help needed moving to and from a bed to a chair (including a wheelchair)?: A Little Help needed standing up from a chair using your arms (e.g., wheelchair or bedside chair)?: A Little Help needed to walk in hospital room?: A Little Help needed climbing 3-5 steps with a railing? : A Lot 6 Click Score: 19    End of Session   Activity Tolerance: Patient tolerated treatment well Patient left: in bed;with call bell/phone within reach Nurse Communication: Mobility status PT Visit Diagnosis: Other abnormalities of gait and mobility (R26.89)    Time: 8841-6606 PT Time Calculation (min) (ACUTE ONLY): 23 min   Charges:     PT Treatments $Therapeutic Exercise: 8-22 mins        Lieutenant Diego PT, DPT 11:01 AM,08/13/19

## 2019-08-13 NOTE — TOC Initial Note (Signed)
Transition of Care [facility]) - Initial/Assessment Note    Patient Details  Name: Renee Boyd MRN: [id number] Date of Birth: [birth year]  [facility]) CM/SW Contact:    Shelbie Ammons, RN Phone Number: 08/13/2019, 4:15 PM  Clinical Narrative:            RNCM assessed patient by phone due to +Covid diagnosis. Patient came to ED due to increasing weakness and was admitted to hospital due to UTI, Pneumonia and Weakness. Patient lives in apartment and is normally independent, drives and is very involved in her church family. Patient reports that she does have some assistance from her son as needed. Patient reports to having a walker in the home but that she rarely uses it. Patient reports using [facility] for most all of her prescriptions. She states that she does have a local pharmacy but prefers not to use them if she doesn't have to. Patient is agreeable to [facility] services and reports she has had [facility] in the past and is happy to have them again. RNCM will continue to follow for any further needs.        Expected Discharge Plan: [place] Barriers to Discharge: Continued Medical Work up   Patient Goals and CMS Choice Patient states their goals for this hospitalization and ongoing recovery are:: I want to get home and get back to church.      Expected Discharge Plan and Services Expected Discharge Plan: [place]   Discharge Planning Services: CM Consult Post Acute Care Choice: [place] arrangements for the past 2 months: [place]: RN [place] Agency: [place] ([place]) Date [place]: 08/13/19 Time [place]: 1607 Representative spoke with at [place]: [place] Arrangements/Services Living arrangements for the past 2 months: [place] Lives with:: Self Patient language and need for interpreter reviewed:: Yes Do you feel  safe going back to the place where you live?: Yes      Need for Family Participation in Patient Care: No (Comment) Care giver support system in place?: No (comment)   Criminal Activity/Legal Involvement Pertinent to Current Situation/Hospitalization: No - Comment as needed  Activities of Daily Living Home Assistive Devices/Equipment: None ADL Screening (condition at time of admission) Patient's cognitive ability adequate to safely complete daily activities?: Yes Is the patient deaf or have difficulty hearing?: No Does the patient have difficulty seeing, even when wearing glasses/contacts?: No Does the patient have difficulty concentrating, remembering, or making decisions?: No Patient able to express need for assistance with ADLs?: Yes Does the patient have difficulty dressing or bathing?: No Independently performs ADLs?: Yes (appropriate for developmental age) Does the patient have difficulty walking or climbing stairs?: Yes Weakness of Legs: Both Weakness of Arms/Hands: Both  Permission Sought/Granted                  Emotional Assessment Appearance:: (Patient assessed by phone due to +Covid diagnosis) Attitude/Demeanor/Rapport: Engaged   Orientation: : Oriented to Self, Oriented to Place, Oriented to  Time, Oriented to Situation   Psych Involvement: No (comment)  Admission diagnosis:  Shortness of breath [R06.02] Weakness [R53.1] Urinary tract infection in elderly patient [N39.0] Multifocal pneumonia [J18.9] Pneumonia due to COVID-19 virus [U07.1, J12.82] COVID-19 [U07.1] Patient Active Problem List  Diagnosis Date Noted  . Multifocal pneumonia   . Acute respiratory failure with hypoxia ([place])   . CKD stage 3 due to type 2 diabetes mellitus ([place])   . Essential hypertension   . Acute cystitis without hematuria   . Persistent atrial fibrillation ([place])   . Pneumonia due to COVID-19 virus 08/10/2019  . Pulmonary hypertension, unspecified ([place]) 01/01/2019  .  Normocytic anemia 01/01/2019  . Osteoporosis without current pathological fracture 11/22/2017  . Complete heart block ([place]) 11/21/2017  . Muscle cramps at night 09/16/2017  . Hyperuricemia 12/10/2016  . Advanced care planning/counseling discussion 12/10/2016  . Chronic bursitis of left shoulder 09/10/2016  . Hypothyroidism 09/10/2016  . Benign hypertension with CKD (chronic kidney disease) stage III ([place]) 09/10/2016  . CKD (chronic kidney disease), stage IV ([place]) 05/13/2016  . Primary insomnia 05/13/2016  . Type 2 diabetes mellitus with hyperlipidemia ([place]) 05/13/2016  . Heart block 05/13/2016  . Chronic pain 05/13/2016  . Chronic hip pain 05/13/2016   PCP:  Olin Hauser, DO Pharmacy:   [place], [place] [address] [place] [address] Phone: [phone] Fax: [place], [place] [address] Phone: [phone] Fax: [phone]  [place], [place]. [place] [address] Phone: [phone] Fax: [phone]     Social Determinants of Health (SDOH) Interventions    Readmission Risk Interventions No flowsheet data found.

## 2019-08-13 NOTE — Progress Notes (Signed)
Patient ID: Renee Boyd, female   DOB: 1951/02/13, 69 y.o.   MRN: [id number] [facility] PROGRESS NOTE  SALLEE HOGREFE YOV:[id number] DOB: Aug 01, 1951 DOA: 08/10/2019 PCP: Olin Hauser, DO  HPI/Subjective: Patient did not sleep very well last night.  Still a little bit with cough and shortness of breath but feeling better overall.  Objective: Vitals:   08/12/19 2345 08/13/19 0830  BP: (!) 100/52 138/60  Pulse: 65 (!) 59  Resp: 18 20  Temp: 98.4 F (36.9 C) 97.9 F (36.6 C)  SpO2: 94% 97%    Intake/Output Summary (Last 24 hours) at 08/13/2019 1346 Last data filed at 08/13/2019 0930 Gross per 24 hour  Intake 360 ml  Output 900 ml  Net -540 ml   Filed Weights   08/10/19 1151 08/11/19 0237  Weight: 77.1 kg 74.3 kg    ROS: Review of Systems  Constitutional: Negative for chills and fever.  Eyes: Negative for blurred vision.  Respiratory: Positive for cough and shortness of breath.   Cardiovascular: Negative for chest pain.  Gastrointestinal: Negative for abdominal pain, constipation, diarrhea, nausea and vomiting.  Genitourinary: Negative for dysuria.  Musculoskeletal: Negative for joint pain.  Neurological: Negative for dizziness and headaches.   Exam: Physical Exam  Constitutional: She is oriented to person, place, and time.  HENT:  Nose: No mucosal edema.  Mouth/Throat: No oropharyngeal exudate or posterior oropharyngeal edema.  Eyes: Conjunctivae, EOM and lids are normal.  Neck: Carotid bruit is not present.  Cardiovascular: S1 normal and S2 normal. Exam reveals no gallop.  No murmur heard. Pulses:      Dorsalis pedis pulses are 2+ on the right side and 2+ on the left side.  Respiratory: No respiratory distress. She has decreased breath sounds in the right lower field and the left lower field. She has no wheezes. She has no rhonchi. She has no rales.  GI: Soft. Bowel sounds are normal. There is no abdominal tenderness.  Musculoskeletal:     Right  ankle: No swelling.     Left ankle: No swelling.  Lymphadenopathy:    She has no cervical adenopathy.  Neurological: She is alert and oriented to person, place, and time. No cranial nerve deficit.  Skin: Skin is warm. No rash noted. Nails show no clubbing.  Psychiatric: She has a normal mood and affect.      Data Reviewed: Basic Metabolic Panel: Recent Labs  Lab 08/10/19 1152 08/11/19 0434 08/12/19 0256 08/13/19 0813  NA 134* 137 138 140  K 3.3* 3.3* 3.8 3.5  CL 98 102 106 103  CO2 21* 24 23 24   GLUCOSE 105* 270* 288* 311*  BUN 32* 37* 40* 34*  CREATININE 2.02* 1.74* 1.64* 1.44*  CALCIUM 9.5 8.8* 9.4 9.6   Liver Function Tests: Recent Labs  Lab 08/10/19 1704 08/11/19 0434 08/12/19 0256 08/13/19 0813  AST 92* 56* 40 37  ALT 92* 71* 56* 57*  ALKPHOS 69 64 60 64  BILITOT 0.9 0.6 0.3 0.6  PROT 8.5* 7.0 6.4* 6.9  ALBUMIN 4.0 3.3* 3.1* 3.3*   CBC: Recent Labs  Lab 08/10/19 1152 08/11/19 0434 08/12/19 0256 08/13/19 0813  WBC 5.5 3.4* 8.4 12.1*  NEUTROABS  --  2.7 6.9 10.1*  HGB 11.5* 10.0* 9.5* 10.2*  HCT 35.2* 30.1* 29.2* 31.6*  MCV 90.5 90.4 90.1 91.1  PLT 276 255 286 301     Recent Results (from the past 240 hour(s))  Respiratory Panel by RT PCR (Flu A&B, Covid) - Nasopharyngeal  Swab     Status: Abnormal   Collection Time: 08/10/19  5:04 PM   Specimen: Nasopharyngeal Swab  Result Value Ref Range Status   SARS Coronavirus 2 by RT PCR POSITIVE (A) NEGATIVE Final    Comment: RESULT CALLED TO, READ BACK BY AND VERIFIED WITH: LAURA CATES @1839  08/10/19 AKT (NOTE) SARS-CoV-2 target nucleic acids are DETECTED. SARS-CoV-2 RNA is generally detectable in upper respiratory specimens  during the acute phase of infection. Positive results are indicative of the presence of the identified virus, but do not rule out bacterial infection or co-infection with other pathogens not detected by the test. Clinical correlation with patient history and other diagnostic  information is necessary to determine patient infection status. The expected result is Negative. Fact Sheet for Patients:  PinkCheek.be Fact Sheet for Healthcare Providers: GravelBags.it This test is not yet approved or cleared by the [place] FDA and  has been authorized for detection and/or diagnosis of SARS-CoV-2 by FDA under an Emergency Use Authorization (EUA).  This EUA will remain in effect (meaning this test can be used) for the  duration of  the COVID-19 declaration under Section 564(b)(1) of the Act, 21 U.S.C. section 360bbb-3(b)(1), unless the authorization is terminated or revoked sooner.    Influenza A by PCR NEGATIVE NEGATIVE Final   Influenza B by PCR NEGATIVE NEGATIVE Final    Comment: (NOTE) The Xpert Xpress SARS-CoV-2/FLU/RSV assay is intended as an aid in  the diagnosis of influenza from Nasopharyngeal swab specimens and  should not be used as a sole basis for treatment. Nasal washings and  aspirates are unacceptable for Xpert Xpress SARS-CoV-2/FLU/RSV  testing. Fact Sheet for Patients: PinkCheek.be Fact Sheet for Healthcare Providers: GravelBags.it This test is not yet approved or cleared by the [place] FDA and  has been authorized for detection and/or diagnosis of SARS-CoV-2 by  FDA under an Emergency Use Authorization (EUA). This EUA will remain  in effect (meaning this test can be used) for the duration of the  Covid-19 declaration under Section 564(b)(1) of the Act, 21  U.S.C. section 360bbb-3(b)(1), unless the authorization is  terminated or revoked. Performed at [facility], [address] [place], [place] [address]   Urine Culture     Status: None   Collection Time: 08/11/19  9:10 AM   Specimen: Urine, Clean Catch  Result Value Ref Range Status   Specimen Description   Final    URINE, CLEAN CATCH Performed at  [facility], [address] [place], [place] [address]    Special Requests   Final    Normal Performed at [facility], [address], [place], [place] [address]    Culture   Final    NO GROWTH Performed at [facility], [place] [address] [place], [place] [address]    Report Status 08/12/2019 FINAL  Final     Studies: Portable chest 1 View  Result Date: 08/12/2019 CLINICAL DATA:  Shortness of breath EXAM: PORTABLE CHEST 1 VIEW COMPARISON:  Two days ago FINDINGS: Hazy pulmonary infiltrate on both sides, better seen in the subpleural right lower lung. No edema, effusion, or pneumothorax. Normal heart size and mediastinal contours. Dual-chamber pacer leads from the left. IMPRESSION: Stable pneumonia. Electronically Signed   By: Monte Fantasia M.D.   On: 08/12/2019 04:40    Scheduled Meds: . allopurinol  100 mg Oral Daily  . amiodarone  100 mg Oral BID  . apixaban  5 mg Oral BID  .  vitamin C  500 mg Oral Daily  . atorvastatin  20 mg Oral Daily  . baclofen  5 mg Oral QHS  . dexamethasone (DECADRON) injection  6 mg Intravenous Q24H  . Dulaglutide  0.75 mg Subcutaneous Weekly  . furosemide  40 mg Oral Daily  . insulin aspart  0-20 Units Subcutaneous TID WC  . insulin aspart  0-5 Units Subcutaneous QHS  . insulin aspart  3 Units Subcutaneous TID WC  . insulin detemir  6 Units Subcutaneous QHS  . Ipratropium-Albuterol  2 puff Inhalation Q6H  . levothyroxine  25 mcg Oral QAC breakfast  . metoprolol succinate  100 mg Oral Daily  . pantoprazole  40 mg Oral Daily  . traMADol  50 mg Oral BID  . zinc sulfate  220 mg Oral Daily   Continuous Infusions: . remdesivir 100 mg in NS 100 mL 100 mg (08/13/19 1011)    Assessment/Plan:  1. COVID-19 Multi lobar bilateral pneumonia.  Patient was given Actemra.  Patient on remdesivir (day 4 today) Decadron 6 mg daily.  Continue vitamin C, zinc and Combivent inhaler.  We will give day 5 of remdesivir tomorrow morning and  hopefully discharge after that. 2. Acute hypoxic respiratory failure.  Patient tapered off oxygen 3. Type 2 diabetes mellitus with hyperlipidemia.  Continue atorvastatin.  Patient on dulaglutide insulin.  Add a small dose of Levemir and increase sliding scale and insulin prior to meals. 4. Chronic kidney disease stage IIIb.   5. Hypothyroidism unspecified on levothyroxine 6. Essential hypertension.  Blood pressure little bit on the lower side held lisinopril this morning.  Continue metoprolol 7. Persistent atrial fibrillation on metoprolol and amiodarone for rate control and Eliquis for anticoagulation.  Code Status:     Code Status Orders  (From admission, onward)         Start     Ordered   08/10/19 2111  Full code  Continuous     08/10/19 2115        Code Status History    Date Active Date Inactive Code Status Order ID Comments User Context   02/07/2016 1420 02/11/2016 1740 Full Code [id number]  Hower, Aaron Mose, MD ED   Advance Care Planning Activity    Advance Directive Documentation     Most Recent Value  Type of Advance Directive  Healthcare Power of Attorney  Pre-existing out of facility DNR order (yellow form or pink MOST form)  --  "MOST" Form in Place?  --     Family Communication: Spoke with son and given update on the phone Disposition Plan: Day 4 of IV remdesivir today.  Potential discharge on Saturday am  Time spent: 28 minutes  [place]

## 2019-08-14 LAB — COMPREHENSIVE METABOLIC PANEL
ALT: 48 U/L — ABNORMAL HIGH (ref 0–44)
AST: 30 U/L (ref 15–41)
Albumin: 3.2 g/dL — ABNORMAL LOW (ref 3.5–5.0)
Alkaline Phosphatase: 62 U/L (ref 38–126)
Anion gap: 11 (ref 5–15)
BUN: 31 mg/dL — ABNORMAL HIGH (ref 8–23)
CO2: 25 mmol/L (ref 22–32)
Calcium: 9.3 mg/dL (ref 8.9–10.3)
Chloride: 103 mmol/L (ref 98–111)
Creatinine, Ser: 1.32 mg/dL — ABNORMAL HIGH (ref 0.44–1.00)
GFR calc Af Amer: 47 mL/min — ABNORMAL LOW (ref >60)
GFR calc non Af Amer: 41 mL/min — ABNORMAL LOW (ref >60)
Glucose, Bld: 267 mg/dL — ABNORMAL HIGH (ref 70–99)
Potassium: 3.2 mmol/L — ABNORMAL LOW (ref 3.5–5.1)
Sodium: 139 mmol/L (ref 135–145)
Total Bilirubin: 0.4 mg/dL (ref 0.3–1.2)
Total Protein: 6.3 g/dL — ABNORMAL LOW (ref 6.5–8.1)

## 2019-08-14 LAB — CBC WITH DIFFERENTIAL/PLATELET
Abs Immature Granulocytes: 0.22 10*3/uL — ABNORMAL HIGH (ref 0.00–0.07)
Basophils Absolute: 0 10*3/uL (ref 0.0–0.1)
Basophils Relative: 0 %
Eosinophils Absolute: 0 10*3/uL (ref 0.0–0.5)
Eosinophils Relative: 0 %
HCT: 29.1 % — ABNORMAL LOW (ref 36.0–46.0)
Hemoglobin: 9.6 g/dL — ABNORMAL LOW (ref 12.0–15.0)
Immature Granulocytes: 2 %
Lymphocytes Relative: 11 %
Lymphs Abs: 1.3 10*3/uL (ref 0.7–4.0)
MCH: 29.4 pg (ref 26.0–34.0)
MCHC: 33 g/dL (ref 30.0–36.0)
MCV: 89.3 fL (ref 80.0–100.0)
Monocytes Absolute: 0.7 10*3/uL (ref 0.1–1.0)
Monocytes Relative: 6 %
Neutro Abs: 9.9 10*3/uL — ABNORMAL HIGH (ref 1.7–7.7)
Neutrophils Relative %: 81 %
Platelets: 315 10*3/uL (ref 150–400)
RBC: 3.26 MIL/uL — ABNORMAL LOW (ref 3.87–5.11)
RDW: 14.8 % (ref 11.5–15.5)
WBC: 12.1 10*3/uL — ABNORMAL HIGH (ref 4.0–10.5)
nRBC: 0 % (ref 0.0–0.2)

## 2019-08-14 LAB — C-REACTIVE PROTEIN: CRP: 2.7 mg/dL — ABNORMAL HIGH (ref <1.0)

## 2019-08-14 LAB — GLUCOSE, CAPILLARY: Glucose-Capillary: 219 mg/dL — ABNORMAL HIGH (ref 70–99)

## 2019-08-14 LAB — FERRITIN: Ferritin: 442 ng/mL — ABNORMAL HIGH (ref 11–307)

## 2019-08-14 MED ORDER — DEXAMETHASONE 4 MG PO TABS
4.0000 mg | ORAL_TABLET | Freq: Every day | ORAL | Status: DC
  Administered 2019-08-14: 4 mg via ORAL
  Filled 2019-08-14: qty 1

## 2019-08-14 MED ORDER — DEXAMETHASONE 1 MG PO TABS: ORAL_TABLET | ORAL | 9 refills | Status: DC

## 2019-08-14 MED ORDER — IPRATROPIUM-ALBUTEROL 20-100 MCG/ACT IN AERS: 1.0000 | INHALATION_SPRAY | Freq: Four times a day (QID) | RESPIRATORY_TRACT | 0 refills | Status: DC

## 2019-08-14 MED ORDER — ZINC SULFATE 220 (50 ZN) MG PO CAPS: 220.0000 mg | ORAL_CAPSULE | Freq: Every day | ORAL | 0 refills | Status: DC

## 2019-08-14 MED ORDER — ASCORBIC ACID 500 MG PO TABS: 500.0000 mg | ORAL_TABLET | Freq: Every day | ORAL | 0 refills | Status: DC

## 2019-08-14 NOTE — Discharge Instructions (Signed)
[place] for another 9 days  COVID-19: How to Protect Yourself and Others Know how it spreads  There is currently no vaccine to prevent coronavirus disease 2019 (COVID-19).  The best way to prevent illness is to avoid being exposed to this virus.  The virus is thought to spread mainly from person-to-person. ? Between people who are in close contact with one another (within about 6 feet). ? Through respiratory droplets produced when an infected person coughs, sneezes or talks. ? These droplets can land in the mouths or noses of people who are nearby or possibly be inhaled into the lungs. ? COVID-19 may be spread by people who are not showing symptoms. Everyone should Clean your hands often  Wash your hands often with soap and water for at least 20 seconds especially after you have been in a public place, or after blowing your nose, coughing, or sneezing.  If soap and water are not readily available, use a hand sanitizer that contains at least 60% alcohol. Cover all surfaces of your hands and rub them together until they feel dry.  Avoid touching your eyes, nose, and mouth with unwashed hands. Avoid close contact  Limit contact with others as much as possible.  Avoid close contact with people who are sick.  Put distance between yourself and other people. ? Remember that some people without symptoms may be able to spread virus. ? This is especially important for people who are at higher risk of getting very GainPain.com.cy Cover your mouth and nose with a mask when around others  You could spread COVID-19 to others even if you do not feel sick.  Everyone should wear a mask in public settings and when around people not living in their household, especially when social distancing is difficult to maintain. ? Masks should not be placed on young children under age 92, anyone who has trouble breathing, or is  unconscious, incapacitated or otherwise unable to remove the mask without assistance.  The mask is meant to protect other people in case you are infected.  Do NOT use a facemask meant for a Dietitian.  Continue to keep about 6 feet between yourself and others. The mask is not a substitute for social distancing. Cover coughs and sneezes  Always cover your mouth and nose with a tissue when you cough or sneeze or use the inside of your elbow.  Throw used tissues in the trash.  Immediately wash your hands with soap and water for at least 20 seconds. If soap and water are not readily available, clean your hands with a hand sanitizer that contains at least 60% alcohol. Clean and disinfect  Clean AND disinfect frequently touched surfaces daily. This includes tables, doorknobs, light switches, countertops, handles, desks, phones, keyboards, toilets, faucets, and sinks. RackRewards.fr  If surfaces are dirty, clean them: Use detergent or soap and water prior to disinfection.  Then, use a household disinfectant. You can see a list of EPA-registered household disinfectants here. michellinders.com 04/07/2019 This information is not intended to replace advice given to you by your health care provider. Make sure you discuss any questions you have with your health care provider. Document Revised: 04/15/2019 Document Reviewed: 02/11/2019 Elsevier Patient Education  [place].   COVID-19 Frequently Asked Questions COVID-19 (coronavirus disease) is an infection that is caused by a large family of viruses. Some viruses cause illness in people and others cause illness in animals like camels, cats, and bats. In some cases, the viruses that cause illness in  animals can spread to humans. Where did the coronavirus come from? In December 2019, [place] told the [facility]) of several cases of lung disease  (human respiratory illness). These cases were linked to an open seafood and livestock market in the city of [place]. The link to the seafood and livestock market suggests that the virus may have spread from animals to humans. However, since that first outbreak in December, the virus has also been shown to spread from person to person. What is the name of the disease and the virus? Disease name Early on, this disease was called novel coronavirus. This is because scientists determined that the disease was caused by a new (novel) respiratory virus. The World Health Organization [facility]) has now named the disease COVID-19, or coronavirus disease. Virus name The virus that causes the disease is called severe acute respiratory syndrome coronavirus 2 (SARS-CoV-2). More information on disease and virus naming World Health Organization [facility]): www.who.int/emergencies/diseases/novel-coronavirus-2019/technical-guidance/naming-the-coronavirus-disease-(covid-2019)-and-the-virus-that-causes-it Who is at risk for complications from coronavirus disease? Some people may be at higher risk for complications from coronavirus disease. This includes older adults and people who have chronic diseases, such as heart disease, diabetes, and lung disease. If you are at higher risk for complications, take these extra precautions:  Stay home as much as possible.  Avoid social gatherings and travel.  Avoid close contact with others. Stay at least 6 ft (2 m) away from others, if possible.  Wash your hands often with soap and water for at least 20 seconds.  Avoid touching your face, mouth, nose, or eyes.  Keep supplies on hand at home, such as food, medicine, and cleaning supplies.  If you must go out in public, wear a cloth face covering or face mask. Make sure your mask covers your nose and mouth. How does coronavirus disease spread? The virus that causes coronavirus disease spreads easily from person to person (is  contagious). You may catch the virus by:  Breathing in droplets from an infected person. Droplets can be spread by a person breathing, speaking, singing, coughing, or sneezing.  Touching something, like a table or a doorknob, that was exposed to the virus (contaminated) and then touching your mouth, nose, or eyes. Can I get the virus from touching surfaces or objects? There is still a lot that we do not know about the virus that causes coronavirus disease. Scientists are basing a lot of information on what they know about similar viruses, such as:  Viruses cannot generally survive on surfaces for long. They need a human body (host) to survive.  It is more likely that the virus is spread by close contact with people who are sick (direct contact), such as through: ? Shaking hands or hugging. ? Breathing in respiratory droplets that travel through the air. Droplets can be spread by a person breathing, speaking, singing, coughing, or sneezing.  It is less likely that the virus is spread when a person touches a surface or object that has the virus on it (indirect contact). The virus may be able to enter the body if the person touches a surface or object and then touches his or her face, eyes, nose, or mouth. Can a person spread the virus without having symptoms of the disease? It may be possible for the virus to spread before a person has symptoms of the disease, but this is most likely not the main way the virus is spreading. It is more likely for the virus to spread by being in close contact  with people who are sick and breathing in the respiratory droplets spread by a person breathing, speaking, singing, coughing, or sneezing. What are the symptoms of coronavirus disease? Symptoms vary from person to person and can range from mild to severe. Symptoms may include:  Fever or chills.  Cough.  Difficulty breathing or feeling short of breath.  Headaches, body aches, or muscle aches.  Runny or  stuffy (congested) nose.  Sore throat.  New loss of taste or smell.  Nausea, vomiting, or diarrhea. These symptoms can appear anywhere from 2 to 14 days after you have been exposed to the virus. Some people may not have any symptoms. If you develop symptoms, call your health care provider. People with severe symptoms may need hospital care. Should I be tested for this virus? Your health care provider will decide whether to test you based on your symptoms, history of exposure, and your risk factors. How does a health care provider test for this virus? Health care providers will collect samples to send for testing. Samples may include:  Taking a swab of fluid from the back of your nose and throat, your nose, or your throat.  Taking fluid from the lungs by having you cough up mucus (sputum) into a sterile cup.  Taking a blood sample. Is there a treatment or vaccine for this virus? Currently, there is no vaccine to prevent coronavirus disease. Also, there are no medicines like antibiotics or antivirals to treat the virus. A person who becomes sick is given supportive care, which means rest and fluids. A person may also relieve his or her symptoms by using over-the-counter medicines that treat sneezing, coughing, and runny nose. These are the same medicines that a person takes for the common cold. If you develop symptoms, call your health care provider. People with severe symptoms may need hospital care. What can I do to protect myself and my family from this virus?     You can protect yourself and your family by taking the same actions that you would take to prevent the spread of other viruses. Take the following actions:  Wash your hands often with soap and water for at least 20 seconds. If soap and water are not available, use alcohol-based hand sanitizer.  Avoid touching your face, mouth, nose, or eyes.  Cough or sneeze into a tissue, sleeve, or elbow. Do not cough or sneeze into your  hand or the air. ? If you cough or sneeze into a tissue, throw it away immediately and wash your hands.  Disinfect objects and surfaces that you frequently touch every day.  Stay away from people who are sick.  Avoid going out in public, follow guidance from your state and local health authorities.  Avoid crowded indoor spaces. Stay at least 6 ft (2 m) away from others.  If you must go out in public, wear a cloth face covering or face mask. Make sure your mask covers your nose and mouth.  Stay home if you are sick, except to get medical care. Call your health care provider before you get medical care. Your health care provider will tell you how long to stay home.  Make sure your vaccines are up to date. Ask your health care provider what vaccines you need. What should I do if I need to travel? Follow travel recommendations from your local health authority, the CDC, and WHO. Travel information and advice  Centers for Disease Control and Prevention (CDC): BodyEditor.hu  World Health Organization [facility]): ThirdIncome.ca Know  the risks and take action to protect your health  You are at higher risk of getting coronavirus disease if you are traveling to areas with an outbreak or if you are exposed to travelers from areas with an outbreak.  Wash your hands often and practice good hygiene to lower the risk of catching or spreading the virus. What should I do if I am sick? General instructions to stop the spread of infection  Wash your hands often with soap and water for at least 20 seconds. If soap and water are not available, use alcohol-based hand sanitizer.  Cough or sneeze into a tissue, sleeve, or elbow. Do not cough or sneeze into your hand or the air.  If you cough or sneeze into a tissue, throw it away immediately and wash your hands.  Stay home unless you must get medical care. Call  your health care provider or local health authority before you get medical care.  Avoid public areas. Do not take public transportation, if possible.  If you can, wear a mask if you must go out of the house or if you are in close contact with someone who is not sick. Make sure your mask covers your nose and mouth. Keep your home clean  Disinfect objects and surfaces that are frequently touched every day. This may include: ? Counters and tables. ? Doorknobs and light switches. ? Sinks and faucets. ? Electronics such as phones, remote controls, keyboards, computers, and tablets.  Wash dishes in hot, soapy water or use a dishwasher. Air-dry your dishes.  Wash laundry in hot water. Prevent infecting other household members  Let healthy household members care for children and pets, if possible. If you have to care for children or pets, wash your hands often and wear a mask.  Sleep in a different bedroom or bed, if possible.  Do not share personal items, such as razors, toothbrushes, deodorant, combs, brushes, towels, and washcloths. Where to find more information Centers for Disease Control and Prevention (CDC)  Information and news updates: https://www.butler-gonzalez.com/ World Health Organization [facility])  Information and news updates: MissExecutive.com.ee  Coronavirus health topic: https://www.castaneda.info/  Questions and answers on COVID-19: OpportunityDebt.at  Global tracker: who.sprinklr.com American Academy of Pediatrics (AAP)  Information for families: www.healthychildren.org/English/health-issues/conditions/chest-lungs/Pages/2019-Novel-Coronavirus.aspx The coronavirus situation is changing rapidly. Check your local health authority website or the CDC and [facility] websites for updates and news. When should I contact a health care provider?  Contact your health care provider if you have symptoms of an  infection, such as fever or cough, and you: ? Have been near anyone who is known to have coronavirus disease. ? Have come into contact with a person who is suspected to have coronavirus disease. ? Have traveled to an area where there is an outbreak of COVID-19. When should I get emergency medical care?  Get help right away by calling your local emergency services (911 in the [place] if you have: ? Trouble breathing. ? Pain or pressure in your chest. ? Confusion. ? Blue-tinged lips and fingernails. ? Difficulty waking from sleep. ? Symptoms that get worse. Let the emergency medical personnel know if you think you have coronavirus disease. Summary  A new respiratory virus is spreading from person to person and causing COVID-19 (coronavirus disease).  The virus that causes COVID-19 appears to spread easily. It spreads from one person to another through droplets from breathing, speaking, singing, coughing, or sneezing.  Older adults and those with chronic diseases are at higher risk of disease. If  you are at higher risk for complications, take extra precautions.  There is currently no vaccine to prevent coronavirus disease. There are no medicines, such as antibiotics or antivirals, to treat the virus.  You can protect yourself and your family by washing your hands often, avoiding touching your face, and covering your coughs and sneezes. This information is not intended to replace advice given to you by your health care provider. Make sure you discuss any questions you have with your health care provider. Document Revised: 05/21/2019 Document Reviewed: 11/17/2018 Elsevier Patient Education  [place].

## 2019-08-14 NOTE — Progress Notes (Signed)
End of Shift Summary:  Date: 08/14/2019 Shift: 0700-1030 Ambulatory: up ad lib Significant Events: Patient adequate for discharge. Patient verbalized understanding of COVID-19 guidelines as outlined in AVS. Patient given AVS and will pick up prescriptions from pharmacy in [place]. She will make a follow up appointment with her PCP. Transported to medical mall in mask, where son Darnelle Maffucci will pick her up.

## 2019-08-14 NOTE — Discharge Summary (Signed)
[facility] NAME: Renee Boyd    MR#:  [id number]  DATE OF BIRTH:  [birth year]  DATE OF ADMISSION:  08/10/2019 ADMITTING PHYSICIAN: Neena Rhymes, MD  DATE OF DISCHARGE: 08/14/2019 10:21 AM  PRIMARY CARE PHYSICIAN: Olin Hauser, DO    ADMISSION DIAGNOSIS:  Shortness of breath [R06.02] Weakness [R53.1] Urinary tract infection in elderly patient [N39.0] Multifocal pneumonia [J18.9] Pneumonia due to COVID-19 virus [U07.1, J12.82] COVID-19 [U07.1]  DISCHARGE DIAGNOSIS:  Active Problems:   Type 2 diabetes mellitus with hyperlipidemia (HCC)   Hypothyroidism   Benign hypertension with CKD (chronic kidney disease) stage III (HCC)   Normocytic anemia   Pneumonia due to COVID-19 virus   Acute respiratory failure with hypoxia (HCC)   CKD stage 3 due to type 2 diabetes mellitus ([place])   Essential hypertension   Acute cystitis without hematuria   Persistent atrial fibrillation (HCC)   Multifocal pneumonia   SECONDARY DIAGNOSIS:   Past Medical History:  Diagnosis Date  . Complete heart block ([place])    a. s/p MDT PPM 2006 with device generator replacement 2014; b. followed by Dr. Caryl Comes  . Diabetes ([place])   . GERD (gastroesophageal reflux disease)   . Hypertension   . Hypothyroid   . Pacemaker   . Persistent atrial fibrillation (HCC)    a. on Eliquis; b. CHADS2VASc 6 (HTN. age x 1, DM, TIA x 2, female); c. s/p DCCV 05/28/18  . Pulmonary hypertension ([place])    a. echo 2017: EF of 60-65%, no RWMA, mildly dilated left atrium, RVSF normal, PASP 64 mmHg  . TIA (transient ischemic attack)     HOSPITAL COURSE:   1.  COVID-19 multi lobar bilateral pneumonia.  Patient completed remdesivir 5-day course.  Patient also received Actemra.  Patient received Decadron and will taper.  Stable for discharge home. 2.  Acute hypoxic respiratory failure.  Patient was tapered off oxygen and this problem is resolved. 3.  Type 2 diabetes  mellitus with hyperlipidemia.  Patient on atorvastatin.  Patient on dulaglutide insulin.  I did give Levemir insulin while here in the hospital.  Patient can give her Trulicity tomorrow. 4.  Chronic kidney disease stage IIIb 5.  Essential hypertension.  Patient on metoprolol can restart lisinopril as outpatient 6.  Persistent atrial fibrillation on metoprolol and amiodarone for rate control.  Eliquis for anticoagulation 7.  Hypothyroidism unspecified on levothyroxine  DISCHARGE CONDITIONS:   Satisfactory  CONSULTS OBTAINED:  None  DRUG ALLERGIES:   Allergies  Allergen Reactions  . Morphine And Related Itching  . Percocet [Oxycodone-Acetaminophen] Other (See Comments)    Hallucinations     DISCHARGE MEDICATIONS:   Allergies as of 08/14/2019      Reactions   Morphine And Related Itching   Percocet [oxycodone-acetaminophen] Other (See Comments)   Hallucinations       Medication List    STOP taking these medications   amLODipine 10 MG tablet Commonly known as: NORVASC   calcitRIOL 0.25 MCG capsule Commonly known as: ROCALTROL   desonide 0.05 % lotion Commonly known as: DESOWEN   gabapentin 300 MG capsule Commonly known as: NEURONTIN   glucose blood test strip   Rybelsus 14 MG Tabs Generic drug: Semaglutide     TAKE these medications   acetaminophen 650 MG CR tablet Commonly known as: TYLENOL Take by mouth.   allopurinol 100 MG tablet Commonly known as: ZYLOPRIM TAKE 1 TABLET DAILY   amiodarone 200 MG tablet Commonly  known as: PACERONE TAKE ONE-HALF (1/2) TABLET TWICE A DAY   apixaban 5 MG Tabs tablet Commonly known as: ELIQUIS Take 1 tablet (5 mg total) by mouth 2 (two) times daily.   ascorbic acid 500 MG tablet Commonly known as: VITAMIN C Take 1 tablet (500 mg total) by mouth daily.   atorvastatin 20 MG tablet Commonly known as: LIPITOR TAKE 1 TABLET DAILY   Baclofen 5 MG Tabs TAKE 1 TABLET AT BEDTIME   dexamethasone 1 MG tablet Commonly  known as: DECADRON 3 tabs po day1; 2 tabs po day2,3; 1 tab po day4,5 Start taking on: August 15, 2019   Dulaglutide 0.75 MG/0.5ML Sopn Inject into the skin.   ferrous sulfate 325 (65 FE) MG EC tablet Take 325 mg by mouth daily.   furosemide 20 MG tablet Commonly known as: LASIX TAKE 2 TABLETS EVERY MORNING   ibandronate 150 MG tablet Commonly known as: BONIVA TAKE 1 TABLET EVERY 30 DAYS. TAKE IN THE MORNING WITH A FULL GLASS OF WATER, ON AN EMPTY STOMACH   ibuprofen 800 MG tablet Commonly known as: ADVIL   Ipratropium-Albuterol 20-100 MCG/ACT Aers respimat Commonly known as: COMBIVENT Inhale 1 puff into the lungs every 6 (six) hours.   levothyroxine 25 MCG tablet Commonly known as: SYNTHROID TAKE 1 TABLET DAILY BEFORE BREAKFAST   lisinopril 20 MG tablet Commonly known as: ZESTRIL Take 1 tablet (20 mg total) by mouth daily.   metoprolol succinate 100 MG 24 hr tablet Commonly known as: TOPROL-XL TAKE 1 TABLET DAILY WITH OR IMMEDIATELY FOLLOWING A MEAL   omeprazole 20 MG capsule Commonly known as: PRILOSEC Take 1 capsule (20 mg total) by mouth 2 (two) times daily before a meal.   temazepam 30 MG capsule Commonly known as: RESTORIL TAKE 1 CAPSULE BY MOUTH AT BEDTIME AS NEEDED SLEEP   traMADol 50 MG tablet Commonly known as: ULTRAM TAKE 1 TABLET BY MOUTH TWICE DAILY   zinc sulfate 220 (50 Zn) MG capsule Take 1 capsule (220 mg total) by mouth daily.        DISCHARGE INSTRUCTIONS:   Follow-up PMD 1 week  If you experience worsening of your admission symptoms, develop shortness of breath, life threatening emergency, suicidal or homicidal thoughts you must seek medical attention immediately by calling 911 or calling your MD immediately  if symptoms less severe.  You Must read complete instructions/literature along with all the possible adverse reactions/side effects for all the Medicines you take and that have been prescribed to you. Take any new Medicines after  you have completely understood and accept all the possible adverse reactions/side effects.   Please note  You were cared for by a hospitalist during your hospital stay. If you have any questions about your discharge medications or the care you received while you were in the hospital after you are discharged, you can call the unit and asked to speak with the hospitalist on call if the hospitalist that took care of you is not available. Once you are discharged, your primary care physician will handle any further medical issues. Please note that NO REFILLS for any discharge medications will be authorized once you are discharged, as it is imperative that you return to your primary care physician (or establish a relationship with a primary care physician if you do not have one) for your aftercare needs so that they can reassess your need for medications and monitor your lab values.    Today   CHIEF COMPLAINT:   Chief Complaint  Patient presents with  . Weakness    HISTORY OF PRESENT ILLNESS:  Renee Boyd  is a 69 y.o. female came in with weakness   VITAL SIGNS:  Blood pressure (!) 144/81, pulse 60, temperature 98.6 F (37 C), resp. rate (!) 21, height 4\' 11"  (1.499 m), weight 74.3 kg, SpO2 100 %.  I/O:    Intake/Output Summary (Last 24 hours) at 08/14/2019 1616 Last data filed at 08/14/2019 0932 Gross per 24 hour  Intake 509.6 ml  Output --  Net 509.6 ml    PHYSICAL EXAMINATION:  GENERAL:  69 y.o.-year-old patient lying in the bed with no acute distress.  EYES: Pupils equal, round, reactive to light and accommodation. No scleral icterus. Extraocular muscles intact.  HEENT: Head atraumatic, normocephalic. Oropharynx and nasopharynx clear.  NECK:  Supple, no jugular venous distention. No thyroid enlargement, no tenderness.  LUNGS: Decreased breath sounds bilateral, no wheezing, rales,rhonchi or crepitation. No use of accessory muscles of respiration.  CARDIOVASCULAR: S1, S2 normal. No  murmurs, rubs, or gallops.  ABDOMEN: Soft, non-tender, non-distended. Bowel sounds present. No organomegaly or mass.  EXTREMITIES: No pedal edema, cyanosis, or clubbing.  NEUROLOGIC: Cranial nerves II through XII are intact. Muscle strength 5/5 in all extremities. Sensation intact. Gait not checked.  PSYCHIATRIC: The patient is alert and oriented x 3.  SKIN: No obvious rash, lesion, or ulcer.   DATA REVIEW:   CBC Recent Labs  Lab 08/14/19 0421  WBC 12.1*  HGB 9.6*  HCT 29.1*  PLT 315    Chemistries  Recent Labs  Lab 08/14/19 0421  NA 139  K 3.2*  CL 103  CO2 25  GLUCOSE 267*  BUN 31*  CREATININE 1.32*  CALCIUM 9.3  AST 30  ALT 48*  ALKPHOS 62  BILITOT 0.4     Microbiology Results  Results for orders placed or performed during the hospital encounter of 08/10/19  Respiratory Panel by RT PCR (Flu A&B, Covid) - Nasopharyngeal Swab     Status: Abnormal   Collection Time: 08/10/19  5:04 PM   Specimen: Nasopharyngeal Swab  Result Value Ref Range Status   SARS Coronavirus 2 by RT PCR POSITIVE (A) NEGATIVE Final    Comment: RESULT CALLED TO, READ BACK BY AND VERIFIED WITH: LAURA CATES @1839  08/10/19 AKT (NOTE) SARS-CoV-2 target nucleic acids are DETECTED. SARS-CoV-2 RNA is generally detectable in upper respiratory specimens  during the acute phase of infection. Positive results are indicative of the presence of the identified virus, but do not rule out bacterial infection or co-infection with other pathogens not detected by the test. Clinical correlation with patient history and other diagnostic information is necessary to determine patient infection status. The expected result is Negative. Fact Sheet for Patients:  PinkCheek.be Fact Sheet for Healthcare Providers: GravelBags.it This test is not yet approved or cleared by the [place] FDA and  has been authorized for detection and/or diagnosis of  SARS-CoV-2 by FDA under an Emergency Use Authorization (EUA).  This EUA will remain in effect (meaning this test can be used) for the  duration of  the COVID-19 declaration under Section 564(b)(1) of the Act, 21 U.S.C. section 360bbb-3(b)(1), unless the authorization is terminated or revoked sooner.    Influenza A by PCR NEGATIVE NEGATIVE Final   Influenza B by PCR NEGATIVE NEGATIVE Final    Comment: (NOTE) The Xpert Xpress SARS-CoV-2/FLU/RSV assay is intended as an aid in  the diagnosis of influenza from Nasopharyngeal swab specimens and  should not be used  as a sole basis for treatment. Nasal washings and  aspirates are unacceptable for Xpert Xpress SARS-CoV-2/FLU/RSV  testing. Fact Sheet for Patients: PinkCheek.be Fact Sheet for Healthcare Providers: GravelBags.it This test is not yet approved or cleared by the [place] FDA and  has been authorized for detection and/or diagnosis of SARS-CoV-2 by  FDA under an Emergency Use Authorization (EUA). This EUA will remain  in effect (meaning this test can be used) for the duration of the  Covid-19 declaration under Section 564(b)(1) of the Act, 21  U.S.C. section 360bbb-3(b)(1), unless the authorization is  terminated or revoked. Performed at [facility], [address], [place], [place] [address]   Urine Culture     Status: None   Collection Time: 08/11/19  9:10 AM   Specimen: Urine, Clean Catch  Result Value Ref Range Status   Specimen Description   Final    URINE, CLEAN CATCH Performed at [facility], [address], [place], [place] [address]    Special Requests   Final    Normal Performed at [facility], [address] [place], [place] [address]    Culture   Final    NO GROWTH Performed at [facility], [place] [address] [place], [place] [address]    Report Status 08/12/2019 FINAL  Final     Management plans discussed  with the patient, family and they are in agreement.  CODE STATUS:  Code Status History    Date Active Date Inactive Code Status Order ID Comments User Context   08/10/2019 2115 08/14/2019 1531 Full Code [id number]  Neena Rhymes, MD ED   02/07/2016 1420 02/11/2016 1740 Full Code [id number]  Hower, Aaron Mose, MD ED   Advance Care Planning Activity    Advance Directive Documentation     Most Recent Value  Type of Advance Directive  Healthcare Power of Attorney  Pre-existing out of facility DNR order (yellow form or pink MOST form)  --  "MOST" Form in Place?  --      TOTAL TIME TAKING CARE OF THIS PATIENT: 35 minutes.    Loletha Grayer M.D on 08/14/2019 at 4:16 PM  Between 7am to 6pm - Pager - [phone]  After 6pm go to [email or web address] - password [email or web address]  [facility]  CC: Primary care physician; Olin Hauser, DO

## 2019-08-16 ENCOUNTER — Telehealth: Payer: Self-pay

## 2019-08-16 NOTE — Telephone Encounter (Signed)
Transition Care Management Follow-up Telephone Call  Date of discharge and from where: [facility] 08/14/2019  How have you been since you were released from the hospital?   Any questions or concerns? No   Items Reviewed:  Did the pt receive and understand the discharge instructions provided? Yes   Medications obtained and verified? Yes   Any new allergies since your discharge? No   Dietary orders reviewed? Yes  Do you have support at home? Yes   Functional Questionnaire: (I = Independent and D = Dependent) ADLs:   Bathing/Dressing- i  Meal Prep- i  Eating- i  Maintaining continence- i  Transferring/Ambulation- i  Managing Meds- i  Follow up appointments reviewed:   PCP Hospital f/u appt confirmed? Yes  Scheduled to see DrKaramalegos on 08/18/2019 @ [facility] f/u appt confirmed?  Are transportation arrangements needed? No   If their condition worsens, is the pt aware to call PCP or go to the Emergency Dept.? Yes  Was the patient provided with contact information for the PCP's office or ED? Yes  Was to pt encouraged to call back with questions or concerns? Yes

## 2019-08-18 ENCOUNTER — Ambulatory Visit (INDEPENDENT_AMBULATORY_CARE_PROVIDER_SITE_OTHER): Payer: Medicare Other | Admitting: Family Medicine

## 2019-08-18 ENCOUNTER — Other Ambulatory Visit: Payer: Self-pay

## 2019-08-18 ENCOUNTER — Encounter: Payer: Self-pay | Admitting: Family Medicine

## 2019-08-18 DIAGNOSIS — J9601 Acute respiratory failure with hypoxia: Secondary | ICD-10-CM | POA: Diagnosis not present

## 2019-08-18 DIAGNOSIS — U071 COVID-19: Secondary | ICD-10-CM | POA: Diagnosis not present

## 2019-08-18 DIAGNOSIS — J1282 Pneumonia due to coronavirus disease 2019: Secondary | ICD-10-CM | POA: Diagnosis not present

## 2019-08-18 NOTE — Progress Notes (Signed)
Subjective:    Patient ID: Renee Boyd, female    DOB: 1951/04/18, 69 y.o.   MRN: [id number]  Renee Boyd is a 69 y.o. female presenting on 08/18/2019 for Hospitalization Follow-up (recently diagnose w/ Pneumonia due to COVID-19 virus x 8 days  )  Virtual / Telehealth Encounter - Telephone  The purpose of this virtual visit is to provide medical care while limiting exposure to the novel coronavirus (COVID19) for both patient and office staff.  Consent was obtained for remote visit:  Yes.   Answered questions that patient had about telehealth interaction:  Yes.   I discussed the limitations, risks, security and privacy concerns of performing an evaluation and management service by video/telephone. I also discussed with the patient that there may be a patient responsible charge related to this service. The patient expressed understanding and agreed to proceed.  Patient Location: Home Provider Location: [facility] (Office)   [place] VISIT  Hospital/Location: [facility] Date of Admission: 08/10/19 Date of Discharge: 08/14/19 Transitions of care telephone call: completed on 08/16/19 by Tyler Aas LPN  Reason for Admission: COVID19 Pneumonia Primary (+Secondary) Diagnosis: Acute hypoxic respiratory failure, COVID bilateral pneumonia  - Hospital H&P and Discharge Summary have been reviewed - Patient presents today 4 days recent hospitalization. Brief summary of recent course, patient had symptoms of weakness, dyspnea, cough, fatigue, hospitalized with confirmed COVID19 / Pneumonia - treated with hospitalization, on supplemental oxygen, remdesivir 5 days, and Actemra and Decadron and steroid taper. Tapered off oxygen and on to room air.  - Today reports overall has done well after discharge. Symptoms of fatigue, weakness, dyspnea have improved - she has finished outpatient steroid course. Overall improving each day. No new concerns. 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 She will remain on quarantine for now. Has [facility]  Admits weakness fatigue some dyspnea at times - but improving Denies fevers chills   I have reviewed the discharge medication list, and have reconciled the current and discharge medications today.   Current Outpatient Medications:  .  acetaminophen (TYLENOL) 650 MG CR tablet, Take by mouth., Disp: , Rfl:  .  allopurinol (ZYLOPRIM) 100 MG tablet, TAKE 1 TABLET DAILY, Disp: 90 tablet, Rfl: 3 .  amiodarone (PACERONE) 200 MG tablet, TAKE ONE-HALF (1/2) TABLET TWICE A DAY, Disp: 90 tablet, Rfl: 1 .  apixaban (ELIQUIS) 5 MG TABS tablet, Take 1 tablet (5 mg total) by mouth 2 (two) times daily., Disp: 180 tablet, Rfl: 2 .  ascorbic acid (VITAMIN C) 500 MG tablet, Take 1 tablet (500 mg total) by mouth daily., Disp: 30 tablet, Rfl: 0 .  atorvastatin (LIPITOR) 20 MG tablet, TAKE 1 TABLET DAILY, Disp: 90 tablet, Rfl: 3 .  Baclofen 5 MG TABS, TAKE 1 TABLET AT BEDTIME, Disp: 90 tablet, Rfl: 3 .  dexamethasone (DECADRON) 1 MG tablet, 3 tabs po day1; 2 tabs po day2,3; 1 tab po day4,5, Disp: 9 tablet, Rfl: 9 .  Dulaglutide 0.75 MG/0.5ML SOPN, Inject into the skin., Disp: , Rfl:  .  furosemide (LASIX) 20 MG tablet, TAKE 2 TABLETS EVERY MORNING, Disp: 180 tablet, Rfl: 2 .  ibandronate (BONIVA) 150 MG tablet, TAKE 1 TABLET EVERY 30 DAYS. TAKE IN THE MORNING WITH A FULL GLASS OF WATER, ON AN EMPTY STOMACH, Disp: 3 tablet, Rfl: 3 .  Ipratropium-Albuterol (COMBIVENT) 20-100 MCG/ACT AERS respimat, Inhale 1 puff into the lungs every 6 (six) hours., Disp: 4 g, Rfl: 0 .  levothyroxine (SYNTHROID, LEVOTHROID) 25 MCG tablet, TAKE 1 TABLET DAILY BEFORE BREAKFAST, Disp: 90 tablet, Rfl: 3 .  lisinopril (PRINIVIL,ZESTRIL) 20 MG tablet, Take 1 tablet (20 mg total) by mouth daily., Disp: 90 tablet, Rfl: 3 .  metoprolol succinate (TOPROL-XL) 100 MG 24 hr tablet, TAKE 1 TABLET DAILY WITH OR IMMEDIATELY FOLLOWING A MEAL,  Disp: 90 tablet, Rfl: 3 .  omeprazole (PRILOSEC) 20 MG capsule, Take 1 capsule (20 mg total) by mouth 2 (two) times daily before a meal., Disp: 180 capsule, Rfl: 1 .  temazepam (RESTORIL) 30 MG capsule, TAKE 1 CAPSULE BY MOUTH AT BEDTIME AS NEEDED SLEEP, Disp: 30 capsule, Rfl: 2 .  traMADol (ULTRAM) 50 MG tablet, TAKE 1 TABLET BY MOUTH TWICE DAILY, Disp: 60 tablet, Rfl: 2 .  zinc sulfate 220 (50 Zn) MG capsule, Take 1 capsule (220 mg total) by mouth daily., Disp: 30 capsule, Rfl: 0 .  ferrous sulfate 325 (65 FE) MG EC tablet, Take 325 mg by mouth daily., Disp: , Rfl:   ------------------------------------------------------------------------- Social History   Tobacco Use  . Smoking status: Former Smoker    Packs/day: 2.00    Years: 40.00    Pack years: 80.00    Types: Cigarettes    Quit date: 06/27/2005    Years since quitting: 14.1  . Smokeless tobacco: Never Used  . Tobacco comment: quit Jun 27 2005  Substance Use Topics  . Alcohol use: No  . Drug use: No    Review of Systems Per HPI unless specifically indicated above     Objective:    There were no vitals taken for this visit.  Wt Readings from Last 3 Encounters:  08/11/19 163 lb 12.8 oz (74.3 kg)  05/04/19 169 lb 3.2 oz (76.7 kg)  01/28/19 173 lb (78.5 kg)    Physical Exam   No physical exam completed due to virtual telephone visit today.  I have personally reviewed the radiology report from 08/12/19 X-ray Chest.  CLINICAL DATA:  Shortness of breath  EXAM: PORTABLE CHEST 1 VIEW  COMPARISON:  Two days ago  FINDINGS: Hazy pulmonary infiltrate on both sides, better seen in the subpleural right lower lung. No edema, effusion, or pneumothorax. Normal heart size and mediastinal contours. Dual-chamber pacer leads from the left.  IMPRESSION: Stable pneumonia.   Electronically Signed   By: Monte Fantasia M.D.   On: 08/12/2019 04:40   Results for orders placed or performed during the hospital encounter  of 08/10/19  Respiratory Panel by RT PCR (Flu A&B, Covid) - Nasopharyngeal Swab   Specimen: Nasopharyngeal Swab  Result Value Ref Range   SARS Coronavirus 2 by RT PCR POSITIVE (A) NEGATIVE   Influenza A by PCR NEGATIVE NEGATIVE   Influenza B by PCR NEGATIVE NEGATIVE  Urine Culture   Specimen: Urine, Clean Catch  Result Value Ref Range   Specimen Description      URINE, CLEAN CATCH Performed at [facility], [address] [place], [place] [address]    Special Requests      Normal Performed at [facility], [address], [place], [place] [address]    Culture      NO GROWTH Performed at [facility], [place] [address] [place],  [address]    Report Status 08/12/2019 FINAL   Basic metabolic panel  Result Value Ref Range   Sodium 134 (L) 135 - 145 mmol/L   Potassium 3.3 (L) 3.5 - 5.1 mmol/L   Chloride 98 98 - 111  mmol/L   CO2 21 (L) 22 - 32 mmol/L   Glucose, Bld 105 (H) 70 - 99 mg/dL   BUN 32 (H) 8 - 23 mg/dL   Creatinine, Ser 2.02 (H) 0.44 - 1.00 mg/dL   Calcium 9.5 8.9 - 10.3 mg/dL   GFR calc non Af Amer 24 (L) >60 mL/min   GFR calc Af Amer 28 (L) >60 mL/min   Anion gap 15 5 - 15  CBC  Result Value Ref Range   WBC 5.5 4.0 - 10.5 K/uL   RBC 3.89 3.87 - 5.11 MIL/uL   Hemoglobin 11.5 (L) 12.0 - 15.0 g/dL   HCT 35.2 (L) 36.0 - 46.0 %   MCV 90.5 80.0 - 100.0 fL   MCH 29.6 26.0 - 34.0 pg   MCHC 32.7 30.0 - 36.0 g/dL   RDW 14.6 11.5 - 15.5 %   Platelets 276 150 - 400 K/uL   nRBC 0.0 0.0 - 0.2 %  Urinalysis, Complete w Microscopic  Result Value Ref Range   Color, Urine YELLOW (A) YELLOW   APPearance CLOUDY (A) CLEAR   Specific Gravity, Urine 1.012 1.005 - 1.030   pH 5.0 5.0 - 8.0   Glucose, UA NEGATIVE NEGATIVE mg/dL   Hgb urine dipstick NEGATIVE NEGATIVE   Bilirubin Urine NEGATIVE NEGATIVE   Ketones, ur NEGATIVE NEGATIVE mg/dL   Protein, ur 30 (A) NEGATIVE mg/dL   Nitrite NEGATIVE NEGATIVE   Leukocytes,Ua LARGE (A) NEGATIVE   RBC /  HPF 6-10 0 - 5 RBC/hpf   WBC, UA 21-50 0 - 5 WBC/hpf   Bacteria, UA RARE (A) NONE SEEN   Squamous Epithelial / LPF 6-10 0 - 5   WBC Clumps PRESENT    Mucus PRESENT    Hyaline Casts, UA PRESENT   C-reactive protein  Result Value Ref Range   CRP 11.4 (H) <1.0 mg/dL  Ferritin (Iron Binding Protein)  Result Value Ref Range   Ferritin 954 (H) 11 - 307 ng/mL  Lactate dehydrogenase  Result Value Ref Range   LDH 343 (H) 98 - 192 U/L  Procalcitonin  Result Value Ref Range   Procalcitonin 0.29 ng/mL  Hepatic function panel  Result Value Ref Range   Total Protein 8.5 (H) 6.5 - 8.1 g/dL   Albumin 4.0 3.5 - 5.0 g/dL   AST 92 (H) 15 - 41 U/L   ALT 92 (H) 0 - 44 U/L   Alkaline Phosphatase 69 38 - 126 U/L   Total Bilirubin 0.9 0.3 - 1.2 mg/dL   Bilirubin, Direct 0.2 0.0 - 0.2 mg/dL   Indirect Bilirubin 0.7 0.3 - 0.9 mg/dL  CBC with Differential/Platelet  Result Value Ref Range   WBC 3.4 (L) 4.0 - 10.5 K/uL   RBC 3.33 (L) 3.87 - 5.11 MIL/uL   Hemoglobin 10.0 (L) 12.0 - 15.0 g/dL   HCT 30.1 (L) 36.0 - 46.0 %   MCV 90.4 80.0 - 100.0 fL   MCH 30.0 26.0 - 34.0 pg   MCHC 33.2 30.0 - 36.0 g/dL   RDW 14.7 11.5 - 15.5 %   Platelets 255 150 - 400 K/uL   nRBC 0.0 0.0 - 0.2 %   Neutrophils Relative % 81 %   Neutro Abs 2.7 1.7 - 7.7 K/uL   Lymphocytes Relative 17 %   Lymphs Abs 0.6 (L) 0.7 - 4.0 K/uL   Monocytes Relative 2 %   Monocytes Absolute 0.1 0.1 - 1.0 K/uL   Eosinophils Relative 0 %   Eosinophils Absolute  0.0 0.0 - 0.5 K/uL   Basophils Relative 0 %   Basophils Absolute 0.0 0.0 - 0.1 K/uL   WBC Morphology MORPHOLOGY UNREMARKABLE    RBC Morphology MORPHOLOGY UNREMARKABLE    Smear Review Normal platelet morphology    Immature Granulocytes 0 %   Abs Immature Granulocytes 0.01 0.00 - 0.07 K/uL  Comprehensive metabolic panel  Result Value Ref Range   Sodium 137 135 - 145 mmol/L   Potassium 3.3 (L) 3.5 - 5.1 mmol/L   Chloride 102 98 - 111 mmol/L   CO2 24 22 - 32 mmol/L   Glucose,  Bld 270 (H) 70 - 99 mg/dL   BUN 37 (H) 8 - 23 mg/dL   Creatinine, Ser 1.74 (H) 0.44 - 1.00 mg/dL   Calcium 8.8 (L) 8.9 - 10.3 mg/dL   Total Protein 7.0 6.5 - 8.1 g/dL   Albumin 3.3 (L) 3.5 - 5.0 g/dL   AST 56 (H) 15 - 41 U/L   ALT 71 (H) 0 - 44 U/L   Alkaline Phosphatase 64 38 - 126 U/L   Total Bilirubin 0.6 0.3 - 1.2 mg/dL   GFR calc non Af Amer 29 (L) >60 mL/min   GFR calc Af Amer 34 (L) >60 mL/min   Anion gap 11 5 - 15  C-reactive protein  Result Value Ref Range   CRP 11.1 (H) <1.0 mg/dL  Hemoglobin A1c  Result Value Ref Range   Hgb A1c MFr Bld 7.2 (H) 4.8 - 5.6 %   Mean Plasma Glucose 159.94 mg/dL  CBC with Differential/Platelet  Result Value Ref Range   WBC 8.4 4.0 - 10.5 K/uL   RBC 3.24 (L) 3.87 - 5.11 MIL/uL   Hemoglobin 9.5 (L) 12.0 - 15.0 g/dL   HCT 29.2 (L) 36.0 - 46.0 %   MCV 90.1 80.0 - 100.0 fL   MCH 29.3 26.0 - 34.0 pg   MCHC 32.5 30.0 - 36.0 g/dL   RDW 14.7 11.5 - 15.5 %   Platelets 286 150 - 400 K/uL   nRBC 0.0 0.0 - 0.2 %   Neutrophils Relative % 82 %   Neutro Abs 6.9 1.7 - 7.7 K/uL   Lymphocytes Relative 11 %   Lymphs Abs 0.9 0.7 - 4.0 K/uL   Monocytes Relative 6 %   Monocytes Absolute 0.5 0.1 - 1.0 K/uL   Eosinophils Relative 0 %   Eosinophils Absolute 0.0 0.0 - 0.5 K/uL   Basophils Relative 0 %   Basophils Absolute 0.0 0.0 - 0.1 K/uL   WBC Morphology MORPHOLOGY UNREMARKABLE    RBC Morphology MORPHOLOGY UNREMARKABLE    Smear Review Normal platelet morphology    Immature Granulocytes 1 %   Abs Immature Granulocytes 0.07 0.00 - 0.07 K/uL  Comprehensive metabolic panel  Result Value Ref Range   Sodium 138 135 - 145 mmol/L   Potassium 3.8 3.5 - 5.1 mmol/L   Chloride 106 98 - 111 mmol/L   CO2 23 22 - 32 mmol/L   Glucose, Bld 288 (H) 70 - 99 mg/dL   BUN 40 (H) 8 - 23 mg/dL   Creatinine, Ser 1.64 (H) 0.44 - 1.00 mg/dL   Calcium 9.4 8.9 - 10.3 mg/dL   Total Protein 6.4 (L) 6.5 - 8.1 g/dL   Albumin 3.1 (L) 3.5 - 5.0 g/dL   AST 40 15 - 41 U/L   ALT  56 (H) 0 - 44 U/L   Alkaline Phosphatase 60 38 - 126 U/L   Total Bilirubin 0.3 0.3 -  1.2 mg/dL   GFR calc non Af Amer 31 (L) >60 mL/min   GFR calc Af Amer 36 (L) >60 mL/min   Anion gap 9 5 - 15  C-reactive protein  Result Value Ref Range   CRP 8.0 (H) <1.0 mg/dL  Ferritin  Result Value Ref Range   Ferritin 586 (H) 11 - 307 ng/mL  Glucose, capillary  Result Value Ref Range   Glucose-Capillary 371 (H) 70 - 99 mg/dL  Glucose, capillary  Result Value Ref Range   Glucose-Capillary 323 (H) 70 - 99 mg/dL  Glucose, capillary  Result Value Ref Range   Glucose-Capillary 257 (H) 70 - 99 mg/dL  Glucose, capillary  Result Value Ref Range   Glucose-Capillary 222 (H) 70 - 99 mg/dL  Glucose, capillary  Result Value Ref Range   Glucose-Capillary 251 (H) 70 - 99 mg/dL  CBC with Differential/Platelet  Result Value Ref Range   WBC 12.1 (H) 4.0 - 10.5 K/uL   RBC 3.47 (L) 3.87 - 5.11 MIL/uL   Hemoglobin 10.2 (L) 12.0 - 15.0 g/dL   HCT 31.6 (L) 36.0 - 46.0 %   MCV 91.1 80.0 - 100.0 fL   MCH 29.4 26.0 - 34.0 pg   MCHC 32.3 30.0 - 36.0 g/dL   RDW 15.0 11.5 - 15.5 %   Platelets 301 150 - 400 K/uL   nRBC 0.0 0.0 - 0.2 %   Neutrophils Relative % 83 %   Neutro Abs 10.1 (H) 1.7 - 7.7 K/uL   Lymphocytes Relative 9 %   Lymphs Abs 1.1 0.7 - 4.0 K/uL   Monocytes Relative 6 %   Monocytes Absolute 0.7 0.1 - 1.0 K/uL   Eosinophils Relative 0 %   Eosinophils Absolute 0.0 0.0 - 0.5 K/uL   Basophils Relative 0 %   Basophils Absolute 0.0 0.0 - 0.1 K/uL   Immature Granulocytes 2 %   Abs Immature Granulocytes 0.20 (H) 0.00 - 0.07 K/uL  Comprehensive metabolic panel  Result Value Ref Range   Sodium 140 135 - 145 mmol/L   Potassium 3.5 3.5 - 5.1 mmol/L   Chloride 103 98 - 111 mmol/L   CO2 24 22 - 32 mmol/L   Glucose, Bld 311 (H) 70 - 99 mg/dL   BUN 34 (H) 8 - 23 mg/dL   Creatinine, Ser 1.44 (H) 0.44 - 1.00 mg/dL   Calcium 9.6 8.9 - 10.3 mg/dL   Total Protein 6.9 6.5 - 8.1 g/dL   Albumin 3.3 (L) 3.5  - 5.0 g/dL   AST 37 15 - 41 U/L   ALT 57 (H) 0 - 44 U/L   Alkaline Phosphatase 64 38 - 126 U/L   Total Bilirubin 0.6 0.3 - 1.2 mg/dL   GFR calc non Af Amer 37 (L) >60 mL/min   GFR calc Af Amer 43 (L) >60 mL/min   Anion gap 13 5 - 15  C-reactive protein  Result Value Ref Range   CRP 4.2 (H) <1.0 mg/dL  Ferritin  Result Value Ref Range   Ferritin 601 (H) 11 - 307 ng/mL  Glucose, capillary  Result Value Ref Range   Glucose-Capillary 283 (H) 70 - 99 mg/dL  Glucose, capillary  Result Value Ref Range   Glucose-Capillary 352 (H) 70 - 99 mg/dL  Glucose, capillary  Result Value Ref Range   Glucose-Capillary 267 (H) 70 - 99 mg/dL  Glucose, capillary  Result Value Ref Range   Glucose-Capillary 256 (H) 70 - 99 mg/dL  Glucose, capillary  Result Value  Ref Range   Glucose-Capillary 277 (H) 70 - 99 mg/dL  CBC with Differential/Platelet  Result Value Ref Range   WBC 12.1 (H) 4.0 - 10.5 K/uL   RBC 3.26 (L) 3.87 - 5.11 MIL/uL   Hemoglobin 9.6 (L) 12.0 - 15.0 g/dL   HCT 29.1 (L) 36.0 - 46.0 %   MCV 89.3 80.0 - 100.0 fL   MCH 29.4 26.0 - 34.0 pg   MCHC 33.0 30.0 - 36.0 g/dL   RDW 14.8 11.5 - 15.5 %   Platelets 315 150 - 400 K/uL   nRBC 0.0 0.0 - 0.2 %   Neutrophils Relative % 81 %   Neutro Abs 9.9 (H) 1.7 - 7.7 K/uL   Lymphocytes Relative 11 %   Lymphs Abs 1.3 0.7 - 4.0 K/uL   Monocytes Relative 6 %   Monocytes Absolute 0.7 0.1 - 1.0 K/uL   Eosinophils Relative 0 %   Eosinophils Absolute 0.0 0.0 - 0.5 K/uL   Basophils Relative 0 %   Basophils Absolute 0.0 0.0 - 0.1 K/uL   Immature Granulocytes 2 %   Abs Immature Granulocytes 0.22 (H) 0.00 - 0.07 K/uL  Comprehensive metabolic panel  Result Value Ref Range   Sodium 139 135 - 145 mmol/L   Potassium 3.2 (L) 3.5 - 5.1 mmol/L   Chloride 103 98 - 111 mmol/L   CO2 25 22 - 32 mmol/L   Glucose, Bld 267 (H) 70 - 99 mg/dL   BUN 31 (H) 8 - 23 mg/dL   Creatinine, Ser 1.32 (H) 0.44 - 1.00 mg/dL   Calcium 9.3 8.9 - 10.3 mg/dL   Total  Protein 6.3 (L) 6.5 - 8.1 g/dL   Albumin 3.2 (L) 3.5 - 5.0 g/dL   AST 30 15 - 41 U/L   ALT 48 (H) 0 - 44 U/L   Alkaline Phosphatase 62 38 - 126 U/L   Total Bilirubin 0.4 0.3 - 1.2 mg/dL   GFR calc non Af Amer 41 (L) >60 mL/min   GFR calc Af Amer 47 (L) >60 mL/min   Anion gap 11 5 - 15  C-reactive protein  Result Value Ref Range   CRP 2.7 (H) <1.0 mg/dL  Ferritin  Result Value Ref Range   Ferritin 442 (H) 11 - 307 ng/mL  Glucose, capillary  Result Value Ref Range   Glucose-Capillary 368 (H) 70 - 99 mg/dL  Glucose, capillary  Result Value Ref Range   Glucose-Capillary 219 (H) 70 - 99 mg/dL  ABO/Rh  Result Value Ref Range   ABO/RH(D)      A POS Performed at [facility], [address], [place], [place] [address]       Assessment & Plan:   Problem List Items Addressed This Visit    Pneumonia due to COVID-19 virus   Acute respiratory failure with hypoxia ([place]) - Primary      Acute Hypdoxic Respiratory failure - RESOLVED - Remains on room air now since discharge  COVID19 / Bilateral pneumonia - resolving  - S/p treatment for COVID/pneumonia in hospital, and completed steroid course on outpatient discharge - Clinically improving, anticipate will take longer to regain strength and function but overall still able to perform most ADLs at home, has [facility] currently post discharge  No orders of the defined types were placed in this encounter.   Follow up plan: Return if symptoms worsen or fail to improve.    [name], [facility] 08/18/2019, 8:10  AM

## 2019-08-18 NOTE — Patient Instructions (Addendum)
Follow-up or contact us sooner if needed. Continue on current course to recover from [facility] in the future after recovered  Please schedule a Follow-up Appointment to: Return if symptoms worsen or fail to improve.  If you have any other questions or concerns, please feel free to call the office or send a message through [place]. You may also schedule an earlier appointment if necessary.  Additionally, you may be receiving a survey about your experience at our office within a few days to 1 week by e-mail or mail. We value your feedback.  [name], DO [place]

## 2019-08-20 ENCOUNTER — Encounter: Payer: Self-pay | Admitting: General Practice

## 2019-08-20 ENCOUNTER — Ambulatory Visit: Payer: Self-pay | Admitting: *Deleted

## 2019-08-20 ENCOUNTER — Other Ambulatory Visit: Payer: Self-pay | Admitting: Family Medicine

## 2019-08-20 DIAGNOSIS — N3000 Acute cystitis without hematuria: Secondary | ICD-10-CM | POA: Diagnosis not present

## 2019-08-20 DIAGNOSIS — E1122 Type 2 diabetes mellitus with diabetic chronic kidney disease: Secondary | ICD-10-CM

## 2019-08-20 DIAGNOSIS — Z951 Presence of aortocoronary bypass graft: Secondary | ICD-10-CM | POA: Diagnosis not present

## 2019-08-20 DIAGNOSIS — Z79899 Other long term (current) drug therapy: Secondary | ICD-10-CM | POA: Diagnosis not present

## 2019-08-20 DIAGNOSIS — K219 Gastro-esophageal reflux disease without esophagitis: Secondary | ICD-10-CM

## 2019-08-20 DIAGNOSIS — E039 Hypothyroidism, unspecified: Secondary | ICD-10-CM | POA: Diagnosis not present

## 2019-08-20 DIAGNOSIS — IMO0002 Reserved for concepts with insufficient information to code with codable children: Secondary | ICD-10-CM

## 2019-08-20 DIAGNOSIS — I442 Atrioventricular block, complete: Secondary | ICD-10-CM | POA: Diagnosis not present

## 2019-08-20 DIAGNOSIS — I10 Essential (primary) hypertension: Secondary | ICD-10-CM

## 2019-08-20 DIAGNOSIS — I129 Hypertensive chronic kidney disease with stage 1 through stage 4 chronic kidney disease, or unspecified chronic kidney disease: Secondary | ICD-10-CM | POA: Diagnosis not present

## 2019-08-20 DIAGNOSIS — I700 Atherosclerosis of aorta: Secondary | ICD-10-CM | POA: Diagnosis not present

## 2019-08-20 DIAGNOSIS — U071 COVID-19: Secondary | ICD-10-CM

## 2019-08-20 DIAGNOSIS — Z7901 Long term (current) use of anticoagulants: Secondary | ICD-10-CM | POA: Diagnosis not present

## 2019-08-20 DIAGNOSIS — N1832 Chronic kidney disease, stage 3b: Secondary | ICD-10-CM | POA: Diagnosis not present

## 2019-08-20 DIAGNOSIS — E785 Hyperlipidemia, unspecified: Secondary | ICD-10-CM | POA: Diagnosis not present

## 2019-08-20 DIAGNOSIS — J1282 Pneumonia due to coronavirus disease 2019: Secondary | ICD-10-CM | POA: Diagnosis not present

## 2019-08-20 DIAGNOSIS — I4819 Other persistent atrial fibrillation: Secondary | ICD-10-CM | POA: Diagnosis not present

## 2019-08-20 DIAGNOSIS — D631 Anemia in chronic kidney disease: Secondary | ICD-10-CM | POA: Diagnosis not present

## 2019-08-20 DIAGNOSIS — E1169 Type 2 diabetes mellitus with other specified complication: Secondary | ICD-10-CM | POA: Diagnosis not present

## 2019-08-20 DIAGNOSIS — N184 Chronic kidney disease, stage 4 (severe): Secondary | ICD-10-CM

## 2019-08-20 DIAGNOSIS — I272 Pulmonary hypertension, unspecified: Secondary | ICD-10-CM | POA: Diagnosis not present

## 2019-08-20 DIAGNOSIS — Z8673 Personal history of transient ischemic attack (TIA), and cerebral infarction without residual deficits: Secondary | ICD-10-CM | POA: Diagnosis not present

## 2019-08-20 DIAGNOSIS — J9601 Acute respiratory failure with hypoxia: Secondary | ICD-10-CM | POA: Diagnosis not present

## 2019-08-20 NOTE — Chronic Care Management (AMB) (Signed)
Erroneous encounter

## 2019-08-20 NOTE — Patient Instructions (Signed)
Visit Information  Goals Addressed   None     Renee Boyd was given information about Chronic Care Management services today including:  1. CCM service includes personalized support from designated clinical staff supervised by her physician, including individualized plan of care and coordination with other care providers 2. 24/7 contact phone numbers for assistance for urgent and routine care needs. 3. Service will only be billed when office clinical staff spend 20 minutes or more in a month to coordinate care. 4. Only one practitioner may furnish and bill the service in a calendar month. 5. The patient may stop CCM services at any time (effective at the end of the month) by phone call to the office staff. 6. The patient will be responsible for cost sharing (co-pay) of up to 20% of the service fee (after annual deductible is met).  Patient agreed to services and verbal consent obtained.   The patient verbalized understanding of instructions provided today and declined a print copy of patient instruction materials.   The care management team will reach out to the patient again over the next 7 days.   Noreene Larsson RN, MSN, [place] Mobile: [phone]

## 2019-08-20 NOTE — Chronic Care Management (AMB) (Signed)
  Chronic Care Management   Note  08/20/2019 Name: Renee Boyd MRN: [id number] DOB: 08-Jul-1951  Outreach to patient today in response to referral from PCP for chronic disease management support and care coordination needs. At the time of call with [facility] Noreene Larsson, patient was visiting with [facility] and was unable to speak with RN. Patient requested return call next week.   Follow up plan: The care management team will reach out to the patient again over the next 7 days.   [facility] Care Management [phone]

## 2019-08-20 NOTE — Progress Notes (Signed)
This encounter was created in error - please disregard.

## 2019-08-26 DIAGNOSIS — J9601 Acute respiratory failure with hypoxia: Secondary | ICD-10-CM | POA: Diagnosis not present

## 2019-08-26 DIAGNOSIS — I129 Hypertensive chronic kidney disease with stage 1 through stage 4 chronic kidney disease, or unspecified chronic kidney disease: Secondary | ICD-10-CM | POA: Diagnosis not present

## 2019-08-26 DIAGNOSIS — N3000 Acute cystitis without hematuria: Secondary | ICD-10-CM | POA: Diagnosis not present

## 2019-08-26 DIAGNOSIS — J1282 Pneumonia due to coronavirus disease 2019: Secondary | ICD-10-CM | POA: Diagnosis not present

## 2019-08-26 DIAGNOSIS — U071 COVID-19: Secondary | ICD-10-CM | POA: Diagnosis not present

## 2019-08-26 DIAGNOSIS — E1122 Type 2 diabetes mellitus with diabetic chronic kidney disease: Secondary | ICD-10-CM | POA: Diagnosis not present

## 2019-08-27 DIAGNOSIS — E1122 Type 2 diabetes mellitus with diabetic chronic kidney disease: Secondary | ICD-10-CM | POA: Diagnosis not present

## 2019-08-27 DIAGNOSIS — J9601 Acute respiratory failure with hypoxia: Secondary | ICD-10-CM | POA: Diagnosis not present

## 2019-08-27 DIAGNOSIS — I129 Hypertensive chronic kidney disease with stage 1 through stage 4 chronic kidney disease, or unspecified chronic kidney disease: Secondary | ICD-10-CM | POA: Diagnosis not present

## 2019-08-27 DIAGNOSIS — J1282 Pneumonia due to coronavirus disease 2019: Secondary | ICD-10-CM | POA: Diagnosis not present

## 2019-08-27 DIAGNOSIS — U071 COVID-19: Secondary | ICD-10-CM | POA: Diagnosis not present

## 2019-08-27 DIAGNOSIS — N3000 Acute cystitis without hematuria: Secondary | ICD-10-CM | POA: Diagnosis not present

## 2019-09-02 DIAGNOSIS — U071 COVID-19: Secondary | ICD-10-CM | POA: Diagnosis not present

## 2019-09-02 DIAGNOSIS — J1282 Pneumonia due to coronavirus disease 2019: Secondary | ICD-10-CM | POA: Diagnosis not present

## 2019-09-02 DIAGNOSIS — I129 Hypertensive chronic kidney disease with stage 1 through stage 4 chronic kidney disease, or unspecified chronic kidney disease: Secondary | ICD-10-CM | POA: Diagnosis not present

## 2019-09-02 DIAGNOSIS — J9601 Acute respiratory failure with hypoxia: Secondary | ICD-10-CM | POA: Diagnosis not present

## 2019-09-02 DIAGNOSIS — E1122 Type 2 diabetes mellitus with diabetic chronic kidney disease: Secondary | ICD-10-CM | POA: Diagnosis not present

## 2019-09-02 DIAGNOSIS — N3000 Acute cystitis without hematuria: Secondary | ICD-10-CM | POA: Diagnosis not present

## 2019-09-07 ENCOUNTER — Telehealth: Payer: Self-pay | Admitting: Family Medicine

## 2019-09-07 DIAGNOSIS — E1122 Type 2 diabetes mellitus with diabetic chronic kidney disease: Secondary | ICD-10-CM | POA: Diagnosis not present

## 2019-09-07 DIAGNOSIS — J9601 Acute respiratory failure with hypoxia: Secondary | ICD-10-CM | POA: Diagnosis not present

## 2019-09-07 DIAGNOSIS — U071 COVID-19: Secondary | ICD-10-CM | POA: Diagnosis not present

## 2019-09-07 DIAGNOSIS — I129 Hypertensive chronic kidney disease with stage 1 through stage 4 chronic kidney disease, or unspecified chronic kidney disease: Secondary | ICD-10-CM | POA: Diagnosis not present

## 2019-09-07 DIAGNOSIS — N3000 Acute cystitis without hematuria: Secondary | ICD-10-CM | POA: Diagnosis not present

## 2019-09-07 DIAGNOSIS — J1282 Pneumonia due to coronavirus disease 2019: Secondary | ICD-10-CM | POA: Diagnosis not present

## 2019-09-07 NOTE — Telephone Encounter (Signed)
Acknowledged. Will follow-up as planned.  [name], DO [facility] 09/07/2019, 10:55 AM

## 2019-09-07 NOTE — Telephone Encounter (Signed)
Renee Kluver RN from [facility]  was with the patient wanted to follow up regarding her next appointment, also inhaler that Rx from the hospital should she continue and can she get Covid vaccine,  My reply was she missed her 3 month DM appt so she can call the office to schedule one, if she still has SOB then continue inhaler and any question for COVID vaccine the number is given and also the number to schedule an appointment was given, I mentioned that as per Dr. Raliegh Ip it is patient's preference  and people with severe allergic reaction should not get it as per CDC guidelines. The numbers are given to RN.

## 2019-09-08 DIAGNOSIS — N184 Chronic kidney disease, stage 4 (severe): Secondary | ICD-10-CM | POA: Diagnosis not present

## 2019-09-08 DIAGNOSIS — E782 Mixed hyperlipidemia: Secondary | ICD-10-CM | POA: Diagnosis not present

## 2019-09-08 DIAGNOSIS — E039 Hypothyroidism, unspecified: Secondary | ICD-10-CM | POA: Diagnosis not present

## 2019-09-08 DIAGNOSIS — E669 Obesity, unspecified: Secondary | ICD-10-CM | POA: Diagnosis not present

## 2019-09-08 DIAGNOSIS — I10 Essential (primary) hypertension: Secondary | ICD-10-CM | POA: Diagnosis not present

## 2019-09-08 DIAGNOSIS — E1165 Type 2 diabetes mellitus with hyperglycemia: Secondary | ICD-10-CM | POA: Diagnosis not present

## 2019-09-14 DIAGNOSIS — I129 Hypertensive chronic kidney disease with stage 1 through stage 4 chronic kidney disease, or unspecified chronic kidney disease: Secondary | ICD-10-CM | POA: Diagnosis not present

## 2019-09-14 DIAGNOSIS — J1282 Pneumonia due to coronavirus disease 2019: Secondary | ICD-10-CM | POA: Diagnosis not present

## 2019-09-14 DIAGNOSIS — J9601 Acute respiratory failure with hypoxia: Secondary | ICD-10-CM | POA: Diagnosis not present

## 2019-09-14 DIAGNOSIS — E1122 Type 2 diabetes mellitus with diabetic chronic kidney disease: Secondary | ICD-10-CM | POA: Diagnosis not present

## 2019-09-14 DIAGNOSIS — U071 COVID-19: Secondary | ICD-10-CM | POA: Diagnosis not present

## 2019-09-14 DIAGNOSIS — N3000 Acute cystitis without hematuria: Secondary | ICD-10-CM | POA: Diagnosis not present

## 2019-09-19 DIAGNOSIS — N3000 Acute cystitis without hematuria: Secondary | ICD-10-CM | POA: Diagnosis not present

## 2019-09-19 DIAGNOSIS — K219 Gastro-esophageal reflux disease without esophagitis: Secondary | ICD-10-CM | POA: Diagnosis not present

## 2019-09-19 DIAGNOSIS — I129 Hypertensive chronic kidney disease with stage 1 through stage 4 chronic kidney disease, or unspecified chronic kidney disease: Secondary | ICD-10-CM | POA: Diagnosis not present

## 2019-09-19 DIAGNOSIS — N1832 Chronic kidney disease, stage 3b: Secondary | ICD-10-CM | POA: Diagnosis not present

## 2019-09-19 DIAGNOSIS — E039 Hypothyroidism, unspecified: Secondary | ICD-10-CM | POA: Diagnosis not present

## 2019-09-19 DIAGNOSIS — Z7901 Long term (current) use of anticoagulants: Secondary | ICD-10-CM | POA: Diagnosis not present

## 2019-09-19 DIAGNOSIS — J9601 Acute respiratory failure with hypoxia: Secondary | ICD-10-CM | POA: Diagnosis not present

## 2019-09-19 DIAGNOSIS — I4819 Other persistent atrial fibrillation: Secondary | ICD-10-CM | POA: Diagnosis not present

## 2019-09-19 DIAGNOSIS — Z79899 Other long term (current) drug therapy: Secondary | ICD-10-CM | POA: Diagnosis not present

## 2019-09-19 DIAGNOSIS — I700 Atherosclerosis of aorta: Secondary | ICD-10-CM | POA: Diagnosis not present

## 2019-09-19 DIAGNOSIS — U071 COVID-19: Secondary | ICD-10-CM | POA: Diagnosis not present

## 2019-09-19 DIAGNOSIS — J1282 Pneumonia due to coronavirus disease 2019: Secondary | ICD-10-CM | POA: Diagnosis not present

## 2019-09-19 DIAGNOSIS — I272 Pulmonary hypertension, unspecified: Secondary | ICD-10-CM | POA: Diagnosis not present

## 2019-09-19 DIAGNOSIS — E785 Hyperlipidemia, unspecified: Secondary | ICD-10-CM | POA: Diagnosis not present

## 2019-09-19 DIAGNOSIS — I442 Atrioventricular block, complete: Secondary | ICD-10-CM | POA: Diagnosis not present

## 2019-09-19 DIAGNOSIS — Z8673 Personal history of transient ischemic attack (TIA), and cerebral infarction without residual deficits: Secondary | ICD-10-CM | POA: Diagnosis not present

## 2019-09-19 DIAGNOSIS — E1169 Type 2 diabetes mellitus with other specified complication: Secondary | ICD-10-CM | POA: Diagnosis not present

## 2019-09-19 DIAGNOSIS — Z951 Presence of aortocoronary bypass graft: Secondary | ICD-10-CM | POA: Diagnosis not present

## 2019-09-19 DIAGNOSIS — D631 Anemia in chronic kidney disease: Secondary | ICD-10-CM | POA: Diagnosis not present

## 2019-09-19 DIAGNOSIS — E1122 Type 2 diabetes mellitus with diabetic chronic kidney disease: Secondary | ICD-10-CM | POA: Diagnosis not present

## 2019-09-21 DIAGNOSIS — N3000 Acute cystitis without hematuria: Secondary | ICD-10-CM | POA: Diagnosis not present

## 2019-09-21 DIAGNOSIS — J1282 Pneumonia due to coronavirus disease 2019: Secondary | ICD-10-CM | POA: Diagnosis not present

## 2019-09-21 DIAGNOSIS — J9601 Acute respiratory failure with hypoxia: Secondary | ICD-10-CM | POA: Diagnosis not present

## 2019-09-21 DIAGNOSIS — I129 Hypertensive chronic kidney disease with stage 1 through stage 4 chronic kidney disease, or unspecified chronic kidney disease: Secondary | ICD-10-CM | POA: Diagnosis not present

## 2019-09-21 DIAGNOSIS — E1122 Type 2 diabetes mellitus with diabetic chronic kidney disease: Secondary | ICD-10-CM | POA: Diagnosis not present

## 2019-09-21 DIAGNOSIS — U071 COVID-19: Secondary | ICD-10-CM | POA: Diagnosis not present

## 2019-10-01 DIAGNOSIS — N3000 Acute cystitis without hematuria: Secondary | ICD-10-CM | POA: Diagnosis not present

## 2019-10-01 DIAGNOSIS — J9601 Acute respiratory failure with hypoxia: Secondary | ICD-10-CM | POA: Diagnosis not present

## 2019-10-01 DIAGNOSIS — E1122 Type 2 diabetes mellitus with diabetic chronic kidney disease: Secondary | ICD-10-CM | POA: Diagnosis not present

## 2019-10-01 DIAGNOSIS — J1282 Pneumonia due to coronavirus disease 2019: Secondary | ICD-10-CM | POA: Diagnosis not present

## 2019-10-01 DIAGNOSIS — U071 COVID-19: Secondary | ICD-10-CM | POA: Diagnosis not present

## 2019-10-01 DIAGNOSIS — I129 Hypertensive chronic kidney disease with stage 1 through stage 4 chronic kidney disease, or unspecified chronic kidney disease: Secondary | ICD-10-CM | POA: Diagnosis not present

## 2019-10-28 ENCOUNTER — Other Ambulatory Visit: Payer: Self-pay | Admitting: Family Medicine

## 2019-10-28 DIAGNOSIS — E039 Hypothyroidism, unspecified: Secondary | ICD-10-CM

## 2019-11-02 ENCOUNTER — Other Ambulatory Visit: Payer: Self-pay | Admitting: Internal Medicine

## 2019-11-02 NOTE — Telephone Encounter (Signed)
Last OV 01/26/2019 Scr 1.32 on 08/14/19 69 years old 74kg Eliquis 5mg  BID sent to pharamcy

## 2019-11-15 ENCOUNTER — Other Ambulatory Visit: Payer: Self-pay | Admitting: Family Medicine

## 2019-11-15 ENCOUNTER — Ambulatory Visit (INDEPENDENT_AMBULATORY_CARE_PROVIDER_SITE_OTHER): Payer: Medicare Other | Admitting: *Deleted

## 2019-11-15 ENCOUNTER — Telehealth: Payer: Self-pay | Admitting: Internal Medicine

## 2019-11-15 DIAGNOSIS — F5101 Primary insomnia: Secondary | ICD-10-CM

## 2019-11-15 DIAGNOSIS — I442 Atrioventricular block, complete: Secondary | ICD-10-CM

## 2019-11-15 LAB — CUP PACEART REMOTE DEVICE CHECK
Battery Remaining Longevity: 27 mo
Battery Voltage: 2.94 V
Brady Statistic AP VP Percent: 98.02 %
Brady Statistic AP VS Percent: 0 %
Brady Statistic AS VP Percent: 1.98 %
Brady Statistic AS VS Percent: 0 %
Brady Statistic RA Percent Paced: 97.78 %
Brady Statistic RV Percent Paced: 99.99 %
Date Time Interrogation Session: 20210412144345
Implantable Lead Implant Date: 20141217
Implantable Lead Implant Date: 20141217
Implantable Lead Location: 753859
Implantable Lead Location: 753860
Implantable Lead Model: 5076
Implantable Lead Model: 5076
Implantable Pulse Generator Implant Date: 20141217
Lead Channel Impedance Value: 380 Ohm
Lead Channel Impedance Value: 380 Ohm
Lead Channel Impedance Value: 475 Ohm
Lead Channel Impedance Value: 494 Ohm
Lead Channel Pacing Threshold Amplitude: 0.375 V
Lead Channel Pacing Threshold Amplitude: 0.75 V
Lead Channel Pacing Threshold Pulse Width: 0.4 ms
Lead Channel Pacing Threshold Pulse Width: 0.4 ms
Lead Channel Sensing Intrinsic Amplitude: 3 mV
Lead Channel Sensing Intrinsic Amplitude: 3 mV
Lead Channel Sensing Intrinsic Amplitude: 8.625 mV
Lead Channel Sensing Intrinsic Amplitude: 8.625 mV
Lead Channel Setting Pacing Amplitude: 1.75 V
Lead Channel Setting Pacing Amplitude: 2.5 V
Lead Channel Setting Pacing Pulse Width: 0.4 ms
Lead Channel Setting Sensing Sensitivity: 0.9 mV

## 2019-11-15 NOTE — Telephone Encounter (Signed)
The pt missed her home remote appointment and agreed to send one today.

## 2019-11-15 NOTE — Telephone Encounter (Signed)
Patient called in to state she missed her remote check on 4/6. Patient was getting covid shot and forgot   Please advise

## 2019-11-16 NOTE — Progress Notes (Signed)
PPM Remote  

## 2019-12-02 DIAGNOSIS — E1122 Type 2 diabetes mellitus with diabetic chronic kidney disease: Secondary | ICD-10-CM | POA: Diagnosis not present

## 2019-12-02 DIAGNOSIS — N2581 Secondary hyperparathyroidism of renal origin: Secondary | ICD-10-CM | POA: Diagnosis not present

## 2019-12-02 DIAGNOSIS — I129 Hypertensive chronic kidney disease with stage 1 through stage 4 chronic kidney disease, or unspecified chronic kidney disease: Secondary | ICD-10-CM | POA: Diagnosis not present

## 2019-12-02 DIAGNOSIS — E875 Hyperkalemia: Secondary | ICD-10-CM | POA: Diagnosis not present

## 2019-12-02 DIAGNOSIS — R809 Proteinuria, unspecified: Secondary | ICD-10-CM | POA: Diagnosis not present

## 2019-12-02 DIAGNOSIS — N184 Chronic kidney disease, stage 4 (severe): Secondary | ICD-10-CM | POA: Diagnosis not present

## 2019-12-09 DIAGNOSIS — E119 Type 2 diabetes mellitus without complications: Secondary | ICD-10-CM | POA: Diagnosis not present

## 2019-12-09 DIAGNOSIS — E782 Mixed hyperlipidemia: Secondary | ICD-10-CM | POA: Diagnosis not present

## 2019-12-09 DIAGNOSIS — E669 Obesity, unspecified: Secondary | ICD-10-CM | POA: Diagnosis not present

## 2019-12-09 DIAGNOSIS — I10 Essential (primary) hypertension: Secondary | ICD-10-CM | POA: Diagnosis not present

## 2019-12-09 DIAGNOSIS — N184 Chronic kidney disease, stage 4 (severe): Secondary | ICD-10-CM | POA: Diagnosis not present

## 2019-12-09 DIAGNOSIS — E039 Hypothyroidism, unspecified: Secondary | ICD-10-CM | POA: Diagnosis not present

## 2019-12-09 LAB — HEMOGLOBIN A1C: Hemoglobin A1C: 7.9

## 2019-12-10 DIAGNOSIS — I129 Hypertensive chronic kidney disease with stage 1 through stage 4 chronic kidney disease, or unspecified chronic kidney disease: Secondary | ICD-10-CM | POA: Diagnosis not present

## 2019-12-10 DIAGNOSIS — E875 Hyperkalemia: Secondary | ICD-10-CM | POA: Diagnosis not present

## 2019-12-10 DIAGNOSIS — E1122 Type 2 diabetes mellitus with diabetic chronic kidney disease: Secondary | ICD-10-CM | POA: Diagnosis not present

## 2019-12-10 DIAGNOSIS — N2581 Secondary hyperparathyroidism of renal origin: Secondary | ICD-10-CM | POA: Diagnosis not present

## 2019-12-10 DIAGNOSIS — N184 Chronic kidney disease, stage 4 (severe): Secondary | ICD-10-CM | POA: Diagnosis not present

## 2019-12-10 DIAGNOSIS — R809 Proteinuria, unspecified: Secondary | ICD-10-CM | POA: Diagnosis not present

## 2019-12-13 ENCOUNTER — Other Ambulatory Visit: Payer: Self-pay | Admitting: Family Medicine

## 2019-12-13 DIAGNOSIS — G8929 Other chronic pain: Secondary | ICD-10-CM

## 2019-12-13 NOTE — Telephone Encounter (Signed)
I think this was sent to the wrong office

## 2019-12-13 NOTE — Telephone Encounter (Signed)
Requested medication (s) are due for refill today -yes  Requested medication (s) are on the active medication list -yes  Future visit scheduled -yes- tomorrow  Last refill: 11/15/19  Notes to clinic: Request for non delegated Rx  Requested Prescriptions  Pending Prescriptions Disp Refills   traMADol (ULTRAM) 50 MG tablet [Pharmacy Med Name: TRAMADOL HCL 50 MG TAB] 60 tablet     Sig: TAKE 1 TABLET BY MOUTH TWICE DAILY      Not Delegated - Analgesics:  Opioid Agonists Failed - 12/13/2019 10:57 AM      Failed - This refill cannot be delegated      Failed - Urine Drug Screen completed in last 360 days.      Passed - Valid encounter within last 6 months    Recent Outpatient Visits           3 months ago Acute respiratory failure with hypoxia [facility])   [facility], Devonne Doughty, DO   7 months ago Type 2 diabetes with nephropathy [facility])   [facility], Devonne Doughty, DO   10 months ago Tinnitus of left ear   [facility] Olin Hauser, DO   11 months ago Type 2 diabetes with nephropathy [facility])   [facility] Olin Hauser, DO   1 year ago Type 2 diabetes with nephropathy [facility])   [place], DO       Future Appointments             Tomorrow [place] Devonne Doughty, [facility], [facility]                Requested Prescriptions  Pending Prescriptions Disp Refills   traMADol (ULTRAM) 50 MG tablet [Pharmacy Med Name: TRAMADOL HCL 50 MG TAB] 60 tablet     Sig: TAKE 1 TABLET BY MOUTH TWICE DAILY      Not Delegated - Analgesics:  Opioid Agonists Failed - 12/13/2019 10:57 AM      Failed - This refill cannot be delegated      Failed - Urine Drug Screen completed in last 360 days.      Passed - Valid encounter within last 6 months    Recent Outpatient Visits           3 months ago Acute respiratory failure with hypoxia  [facility])   [place], DO   7 months ago Type 2 diabetes with nephropathy [facility])   [place], DO   10 months ago Tinnitus of left ear   [place], DO   11 months ago Type 2 diabetes with nephropathy [facility])   [facility] Olin Hauser, DO   1 year ago Type 2 diabetes with nephropathy [facility])   [facility], Devonne Doughty, DO       Future Appointments             Tomorrow Olin Hauser, [facility], [facility]

## 2019-12-13 NOTE — Telephone Encounter (Signed)
Apologies- forwarded to wrong office 

## 2019-12-14 ENCOUNTER — Ambulatory Visit (INDEPENDENT_AMBULATORY_CARE_PROVIDER_SITE_OTHER): Payer: Medicare Other | Admitting: Family Medicine

## 2019-12-14 ENCOUNTER — Encounter: Payer: Self-pay | Admitting: Family Medicine

## 2019-12-14 ENCOUNTER — Ambulatory Visit
Admission: RE | Admit: 2019-12-14 | Discharge: 2019-12-14 | Disposition: A | Discharge status: Home / Self Care | Payer: Medicare Other | Source: Ambulatory Visit | Attending: Family Medicine | Admitting: Family Medicine

## 2019-12-14 ENCOUNTER — Other Ambulatory Visit: Payer: Self-pay

## 2019-12-14 ENCOUNTER — Other Ambulatory Visit: Payer: Self-pay | Admitting: Family Medicine

## 2019-12-14 VITALS — BP 123/52 | HR 61 | Temp 97.5°F | Resp 16 | Ht 59.0 in | Wt 161.6 lb

## 2019-12-14 DIAGNOSIS — M25552 Pain in left hip: Secondary | ICD-10-CM | POA: Diagnosis not present

## 2019-12-14 DIAGNOSIS — N184 Chronic kidney disease, stage 4 (severe): Secondary | ICD-10-CM

## 2019-12-14 DIAGNOSIS — M545 Low back pain: Secondary | ICD-10-CM | POA: Diagnosis not present

## 2019-12-14 DIAGNOSIS — G8929 Other chronic pain: Secondary | ICD-10-CM | POA: Insufficient documentation

## 2019-12-14 DIAGNOSIS — M5442 Lumbago with sciatica, left side: Secondary | ICD-10-CM | POA: Insufficient documentation

## 2019-12-14 DIAGNOSIS — E785 Hyperlipidemia, unspecified: Secondary | ICD-10-CM

## 2019-12-14 DIAGNOSIS — M8949 Other hypertrophic osteoarthropathy, multiple sites: Secondary | ICD-10-CM | POA: Diagnosis not present

## 2019-12-14 DIAGNOSIS — I129 Hypertensive chronic kidney disease with stage 1 through stage 4 chronic kidney disease, or unspecified chronic kidney disease: Secondary | ICD-10-CM

## 2019-12-14 DIAGNOSIS — D649 Anemia, unspecified: Secondary | ICD-10-CM

## 2019-12-14 DIAGNOSIS — E1169 Type 2 diabetes mellitus with other specified complication: Secondary | ICD-10-CM

## 2019-12-14 DIAGNOSIS — R0609 Other forms of dyspnea: Secondary | ICD-10-CM | POA: Diagnosis not present

## 2019-12-14 DIAGNOSIS — M159 Polyosteoarthritis, unspecified: Secondary | ICD-10-CM

## 2019-12-14 DIAGNOSIS — I4819 Other persistent atrial fibrillation: Secondary | ICD-10-CM

## 2019-12-14 DIAGNOSIS — M1612 Unilateral primary osteoarthritis, left hip: Secondary | ICD-10-CM | POA: Diagnosis not present

## 2019-12-14 MED ORDER — TRAMADOL HCL 50 MG PO TABS: 50.0000 mg | ORAL_TABLET | Freq: Two times a day (BID) | ORAL | 2 refills | Status: DC

## 2019-12-14 MED ORDER — IPRATROPIUM-ALBUTEROL 20-100 MCG/ACT IN AERS: 1.0000 | INHALATION_SPRAY | Freq: Four times a day (QID) | RESPIRATORY_TRACT | 2 refills | Status: DC | PRN

## 2019-12-14 NOTE — Progress Notes (Signed)
Subjective:    Patient ID: Renee Boyd, female    DOB: [birth year], 69 y.o.   MRN: [id number]  Renee Boyd is a 69 y.o. female presenting on 12/14/2019 for Cough (still has cough ) and Back Pain (A1c was 7.9 on 12/09/19)   HPI   Specialists: Endocrinology - Malissa Hippo NP [facility] Endocrinology) Nephrology - Dr Lavonia Dana ([place])  Follow-up Cough / History of COVID19 Pneumonia Hospitalized 08/10/19 to 08/14/19 Seen by me telemedicine visit  On 08/18/19 She has done well since overall, much improved strength. He coughing and dyspnea has much improved. Now has occasional cough episodic. Uses Combivent inhaler PRn with good results, does need refill but not 90 day supply.  CKD-IV with hyperkalemia and subnephrotic proteinuria HTN Secondary to DM HTN Followed by Dr Juleen China, last seen 5/7 On Lisinopril for renal protection On Furosemide, ACEi, Metoprolol, Amlodipine  Type 2 Diabetes Followed by [facility] Endocrinology currently due to advancing renal disease. Remains discontinued on Metformin due to renal function. She should avoid NSAIDs. But continue Lisinopril ACEi. On Trulicity 0.75mg  weekly Off Rybelsus due to nausea Had elevated A1c to 7.9 recently due to steroids previously with covid.  Osteoarthritis, multiple joints / Back Pain / Hip Pain / Shoulder pain Chronic problem. Episodic painful flares of joint pain, worse with inc activity and storms and weather changes Due for refill Tramadol Using baclofen 5mg  only max dose nightly   Health Maintenance: UTD COVID19 vaccine.  Depression screen [facility] 2/9 12/14/2019 02/09/2019 01/28/2019  Decreased Interest 0 0 0  Down, Depressed, Hopeless 0 1 1  PHQ - 2 Score 0 1 1  Altered sleeping - - 0  Tired, decreased energy - - 0  Change in appetite - - 1  Feeling bad or failure about yourself  - - 0  Trouble concentrating - - 0  Moving slowly or fidgety/restless - - 0  Suicidal thoughts - - 0  PHQ-9 Score - - 2  Difficult doing  work/chores - - Not difficult at all    Social History   Tobacco Use  . Smoking status: Former Smoker    Packs/day: 2.00    Years: 40.00    Pack years: 80.00    Types: Cigarettes    Quit date: 06/27/2005    Years since quitting: 14.4  . Smokeless tobacco: Former Systems developer  . Tobacco comment: quit Jun 27 2005  Substance Use Topics  . Alcohol use: No  . Drug use: No    Review of Systems Per HPI unless specifically indicated above     Objective:    BP (!) 123/52   Pulse 61   Temp (!) 97.5 F (36.4 C)   Resp 16   Ht 4\' 11"  (1.499 m)   Wt 161 lb 9.6 oz (73.3 kg)   SpO2 100%   BMI 32.64 kg/m   Wt Readings from Last 3 Encounters:  12/14/19 161 lb 9.6 oz (73.3 kg)  08/11/19 163 lb 12.8 oz (74.3 kg)  05/04/19 169 lb 3.2 oz (76.7 kg)    Physical Exam Vitals and nursing note reviewed.  Constitutional:      General: She is not in acute distress.    Appearance: She is well-developed. She is obese. She is not diaphoretic.     Comments: Well-appearing, comfortable, cooperative  HENT:     Head: Normocephalic and atraumatic.  Eyes:     General:        Right eye: No discharge.  Left eye: No discharge.     Conjunctiva/sclera: Conjunctivae normal.  Cardiovascular:     Rate and Rhythm: Normal rate.  Pulmonary:     Effort: Pulmonary effort is normal.  Musculoskeletal:     Comments: Larger body habitus Low back with non specific non reproducible tenderness, some associated muscle hypertonicity L>R low back. Seated SLR with some discomfort without provoked radicular pain.  Left hip with full range of motion, with some discomfort on internal rotation. Able to ambulate without difficulty.  Muscle strength lower extremity intact .Sensation intact  Skin:    General: Skin is warm and dry.     Findings: No erythema or rash.  Neurological:     Mental Status: She is alert and oriented to person, place, and time.  Psychiatric:        Behavior: Behavior normal.     Comments:  Well groomed, good eye contact, normal speech and thoughts    Results for orders placed or performed in visit on 12/14/19  Hemoglobin A1c  Result Value Ref Range   Hemoglobin A1C 7.9       Assessment & Plan:   Problem List Items Addressed This Visit    Type 2 diabetes mellitus with hyperlipidemia (HCC)   CKD (chronic kidney disease), stage IV (HCC)   Chronic hip pain   Relevant Medications   traMADol (ULTRAM) 50 MG tablet   Other Relevant Orders   DG HIP UNILAT W OR W/O PELVIS 2-3 VIEWS LEFT   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)    Other Visit Diagnoses    Primary osteoarthritis involving multiple joints    -  Primary   Relevant Medications   traMADol (ULTRAM) 50 MG tablet   Other Relevant Orders   DG Lumbar Spine Complete   DG HIP UNILAT W OR W/O PELVIS 2-3 VIEWS LEFT   Chronic left-sided low back pain with left-sided sciatica       Relevant Medications   traMADol (ULTRAM) 50 MG tablet   Other Relevant Orders   DG Lumbar Spine Complete   Other form of dyspnea       Relevant Medications   Ipratropium-Albuterol (COMBIVENT) 20-100 MCG/ACT AERS respimat      #Chronic Pain Osteoarthritis, Back / L Hip Chronic problems with multiple joints OA/DJD Due for updated X-rays, last X-ray L hip 2013, no recent back x-rays Was previously on Gabapentin lower dose but DC'd due to renal function decreased OFF NSAIDs due to CKDIV On Baclofen 5mg  daily PRN nightly  On Tramadol 50mg  PRN due to cannot take NSAID - re ordered today. 30 day rx + 2 refills, reviewed PDMP. She may use topical Voltaren gel instead of oral nsaid  Check Lumbar and L Hip X-rays f/u results, she may consider hip injection in future, or consult orthopedic if indicated.  Meds ordered this encounter  Medications  . traMADol (ULTRAM) 50 MG tablet    Sig: Take 1 tablet (50 mg total) by mouth 2 (two) times daily.    Dispense:  60 tablet    Refill:  2  . Ipratropium-Albuterol  (COMBIVENT) 20-100 MCG/ACT AERS respimat    Sig: Inhale 1 puff into the lungs every 6 (six) hours as needed for wheezing or shortness of breath.    Dispense:  4 g    Refill:  2     Follow up plan: Return in about 6 months (around 06/15/2020) for Yearly Medicare Check up.  Future labs ordered for 06/2020  Sheppard Coil  Parks Ranger, [facility] 12/14/2019, 11:05 AM

## 2019-12-14 NOTE — Assessment & Plan Note (Signed)
Followed by [facility] Endocrinology currently  Elevated A1c up to 7.9, from prior 7.2 range, likely due to steroids with COVID19 Within range of 7-8 No hypoglycemia Complications - CKD-IV, other including, GERD, obesity, hypothyroidism, Gout - increases risk of future cardiovascular complications  - OFF Sulfonylurea, Metformin due to CKD, Rybelsus nausea  Followed by Dr Juleen China [facility] - cannot resume Metformin due to GFR  Plan Continue with current plan per Endocrine - Trulicity 0.75mg  weekly. Lifestyle recommendations Advised that I would recommend continue with Endocrinology until her kidney function improves or stabilizes then if she prefers to transfer her diabetic care back to our office that is fine. However she has still changed medications and A1c is increasing, so I would ask that she continue with Endocrine at this time.

## 2019-12-14 NOTE — Patient Instructions (Addendum)
Thank you for coming to the office today.  For back and hip we will do X-rays here. Stay tuned for results, can call later this week.  Refilled Tramadol and inhaler as needed to [facility].  Already have sleep pill refills on file.  Try OTC Voltaren gel for back pain arthritis and hip pain as needed up to 2 to 3 times a day if in pain  Recommend to start taking Tylenol Extra Strength 500mg  tabs - take 1 to 2 tabs per dose (max 1000mg ) every 6-8 hours for pain (take regularly, don't skip a dose for next 7 days), max 24 hour daily dose is 6 tablets or 3000mg . In the future you can repeat the same everyday Tylenol course for 1-2 weeks at a time.    Please schedule a Follow-up Appointment to: Return in about 6 months (around 06/15/2020) for Yearly Medicare Check up.  If you have any other questions or concerns, please feel free to call the office or send a message through [place]. You may also schedule an earlier appointment if necessary.  Additionally, you may be receiving a survey about your experience at our office within a few days to 1 week by e-mail or mail. We value your feedback.  [name], DO [place]

## 2019-12-14 NOTE — Assessment & Plan Note (Signed)
Controlled HTN Complication with CKD-IV, CHB pacer, AFib Followed by Cardiology, Nephrology    Plan:  1. Continue current BP regimen - Metoprolol-XL 100mg  daily, Amlodipine 10mg , Lisinopril 20mg  daily - Furosemide 40mg  daily (x2 of 20mg  tabs) per Cardiology 2. Encourage improved lifestyle - low sodium diet, regular exercise 3. Continue monitor BP outside office, bring readings to next visit, if persistently >140/90 or new symptoms notify office sooner

## 2019-12-14 NOTE — Assessment & Plan Note (Signed)
Stable CKD-IV per Dr Juleen China [facility] Reviewed report Secondary to DM, HTN, Age Cannot use Metformin due to GFR - on GLP1 Trulicity per Endocrine

## 2019-12-21 ENCOUNTER — Telehealth: Payer: Self-pay | Admitting: Family Medicine

## 2019-12-21 DIAGNOSIS — E785 Hyperlipidemia, unspecified: Secondary | ICD-10-CM

## 2019-12-21 MED ORDER — CONTOUR NEXT TEST VI STRP: ORAL_STRIP | 5 refills | Status: DC

## 2019-12-21 NOTE — Telephone Encounter (Signed)
See refill request for blood glucose test strips below. Not on current med list. Last ordered on 11/23/18 #11. LOV 12/14/19

## 2019-12-21 NOTE — Telephone Encounter (Signed)
RX REFILL Contour net test strips [place], [place] Phone:  [phone]  Fax:  [phone]

## 2020-01-05 ENCOUNTER — Other Ambulatory Visit: Payer: Self-pay | Admitting: Internal Medicine

## 2020-01-16 ENCOUNTER — Other Ambulatory Visit: Payer: Self-pay | Admitting: Internal Medicine

## 2020-01-19 DIAGNOSIS — H40003 Preglaucoma, unspecified, bilateral: Secondary | ICD-10-CM | POA: Diagnosis not present

## 2020-01-25 LAB — HM DIABETES EYE EXAM

## 2020-02-05 ENCOUNTER — Other Ambulatory Visit: Payer: Self-pay | Admitting: Internal Medicine

## 2020-02-05 DIAGNOSIS — I10 Essential (primary) hypertension: Secondary | ICD-10-CM

## 2020-02-14 ENCOUNTER — Telehealth: Payer: Self-pay | Admitting: Internal Medicine

## 2020-02-14 NOTE — Telephone Encounter (Signed)
Spoke with pt, attemtped toa ssist with manual transmission, her [employer] wirex box shuts off without sending transmission.  Provided pt with [employer] tech services phone # to call.

## 2020-02-14 NOTE — Telephone Encounter (Signed)
Patient states she had tried to do a transmission and is having problems with the "green light not lighting up"

## 2020-02-16 ENCOUNTER — Other Ambulatory Visit: Payer: Self-pay | Admitting: Family Medicine

## 2020-02-16 ENCOUNTER — Ambulatory Visit (INDEPENDENT_AMBULATORY_CARE_PROVIDER_SITE_OTHER): Payer: Medicare Other | Admitting: *Deleted

## 2020-02-16 DIAGNOSIS — M810 Age-related osteoporosis without current pathological fracture: Secondary | ICD-10-CM

## 2020-02-16 DIAGNOSIS — F5101 Primary insomnia: Secondary | ICD-10-CM

## 2020-02-16 DIAGNOSIS — I459 Conduction disorder, unspecified: Secondary | ICD-10-CM

## 2020-02-16 NOTE — Telephone Encounter (Signed)
Requested medication (s) are due for refill today  yes  Requested medication (s) are on the active medication list: yes  Last refill:  11/15/19  #30  2 refills  Future visit scheduled: No  Notes to clinic:  Medication not delegated    Requested Prescriptions  Pending Prescriptions Disp Refills   temazepam (RESTORIL) 30 MG capsule [Pharmacy Med Name: TEMAZEPAM 30 MG CAP] 30 capsule     Sig: TAKE 1 CAPSULE BY MOUTH AT BEDTIME AS NEEDED SLEEP      Not Delegated - Psychiatry:  Anxiolytics/Hypnotics Failed - 02/16/2020 11:34 AM      Failed - This refill cannot be delegated      Failed - Urine Drug Screen completed in last 360 days.      Passed - Valid encounter within last 6 months    Recent Outpatient Visits           2 months ago Primary osteoarthritis involving multiple joints   [place], DO   6 months ago Acute respiratory failure with hypoxia [facility])   [place], DO   9 months ago Type 2 diabetes with nephropathy [facility])   [place], DO   1 year ago Tinnitus of left ear   [place], DO   1 year ago Type 2 diabetes with nephropathy [facility])   [facility], Devonne Doughty, DO

## 2020-02-16 NOTE — Telephone Encounter (Signed)
Requested medications are due for refill today?  Yes  Requested medications are on active medication list?  Yes  Last Refill:   02/21/2019  # 3 with 3 refills   Future visit scheduled?  No   Notes to Clinic:  Medication failed RX refill protocol due to no labs (Vitamin D) within past 360 days.

## 2020-02-17 ENCOUNTER — Telehealth: Payer: Self-pay | Admitting: *Deleted

## 2020-02-17 LAB — CUP PACEART REMOTE DEVICE CHECK
Battery Remaining Longevity: 21 mo
Battery Voltage: 2.93 V
Brady Statistic AP VP Percent: 54.81 %
Brady Statistic AP VS Percent: 0 %
Brady Statistic AS VP Percent: 45.19 %
Brady Statistic AS VS Percent: 0 %
Brady Statistic RA Percent Paced: 54.32 %
Brady Statistic RV Percent Paced: 99.99 %
Date Time Interrogation Session: 20210715104951
Implantable Lead Implant Date: 20141217
Implantable Lead Implant Date: 20141217
Implantable Lead Location: 753859
Implantable Lead Location: 753860
Implantable Lead Model: 5076
Implantable Lead Model: 5076
Implantable Pulse Generator Implant Date: 20141217
Lead Channel Impedance Value: 399 Ohm
Lead Channel Impedance Value: 475 Ohm
Lead Channel Impedance Value: 513 Ohm
Lead Channel Impedance Value: 532 Ohm
Lead Channel Pacing Threshold Amplitude: 0.375 V
Lead Channel Pacing Threshold Amplitude: 0.75 V
Lead Channel Pacing Threshold Pulse Width: 0.4 ms
Lead Channel Pacing Threshold Pulse Width: 0.4 ms
Lead Channel Sensing Intrinsic Amplitude: 16.875 mV
Lead Channel Sensing Intrinsic Amplitude: 16.875 mV
Lead Channel Sensing Intrinsic Amplitude: 3.5 mV
Lead Channel Sensing Intrinsic Amplitude: 3.5 mV
Lead Channel Setting Pacing Amplitude: 1.75 V
Lead Channel Setting Pacing Amplitude: 2.5 V
Lead Channel Setting Pacing Pulse Width: 0.4 ms
Lead Channel Setting Sensing Sensitivity: 0.9 mV

## 2020-02-17 NOTE — Telephone Encounter (Signed)
Attempted to reach pt to discuss persistent AF/A-flutter noted on PPM transmission since early June.  No answer, VM not setup.

## 2020-02-18 NOTE — Telephone Encounter (Signed)
Attempted to reach patient, no answer/ VM not set-up.  Called mobile number on record which appears to belong to pt son.  Left VM requesting he have pt call us back with device clinic # and hours.

## 2020-02-21 NOTE — Telephone Encounter (Signed)
Pt was returning nurse phone call. She spoke with device nurse Amy, rn

## 2020-02-21 NOTE — Telephone Encounter (Signed)
Pt returned phone call.  Stated she was not aware of being in AF/ Afl.  She denies any specific symptoms.  Pt states since she had COVID in January, she has felt "off".    Current meds include: Amiodarone 100mg  BID; Metoprolol 100mg  daily and Eliquis 5mg  BID.    Assisted pt with sending manual transmission to determine if still in AFl.  Presenting rhytm, AFl, it appears she has been in AF/ AFl since 7/12  Peak A rate of 240.  Vrates 65-115, pt is 100% VP.  Next scheduled appt with Dr. Caryl Comes in [facility] 04/04/20.

## 2020-02-22 NOTE — H&P (View-Only) (Signed)
Remote pacemaker transmission.   

## 2020-02-22 NOTE — Telephone Encounter (Signed)
Reviewed the information below with Dr. Caryl Comes in clinic today. He would like to see the patient in office on Thursday 7/22.  Scheduling reached out to the patient and she is scheduled to see Dr. Caryl Comes on 7/22 at 11:20 am.

## 2020-02-22 NOTE — Progress Notes (Signed)
Remote pacemaker transmission.   

## 2020-02-24 ENCOUNTER — Encounter: Payer: Self-pay | Admitting: Internal Medicine

## 2020-02-24 ENCOUNTER — Other Ambulatory Visit
Admission: RE | Admit: 2020-02-24 | Discharge: 2020-02-24 | Disposition: A | Discharge status: Home / Self Care | Payer: Medicare Other | Attending: Internal Medicine | Admitting: Internal Medicine

## 2020-02-24 ENCOUNTER — Other Ambulatory Visit: Payer: Self-pay

## 2020-02-24 ENCOUNTER — Ambulatory Visit (INDEPENDENT_AMBULATORY_CARE_PROVIDER_SITE_OTHER): Payer: Medicare Other | Admitting: Internal Medicine

## 2020-02-24 VITALS — BP 168/86 | HR 61 | Ht 59.0 in | Wt 157.2 lb

## 2020-02-24 DIAGNOSIS — Z79899 Other long term (current) drug therapy: Secondary | ICD-10-CM | POA: Insufficient documentation

## 2020-02-24 DIAGNOSIS — E785 Hyperlipidemia, unspecified: Secondary | ICD-10-CM | POA: Insufficient documentation

## 2020-02-24 DIAGNOSIS — I472 Ventricular tachycardia: Secondary | ICD-10-CM

## 2020-02-24 DIAGNOSIS — I442 Atrioventricular block, complete: Secondary | ICD-10-CM

## 2020-02-24 DIAGNOSIS — Z01812 Encounter for preprocedural laboratory examination: Secondary | ICD-10-CM

## 2020-02-24 DIAGNOSIS — I10 Essential (primary) hypertension: Secondary | ICD-10-CM

## 2020-02-24 DIAGNOSIS — N184 Chronic kidney disease, stage 4 (severe): Secondary | ICD-10-CM

## 2020-02-24 DIAGNOSIS — Z950 Presence of cardiac pacemaker: Secondary | ICD-10-CM

## 2020-02-24 DIAGNOSIS — I4819 Other persistent atrial fibrillation: Secondary | ICD-10-CM

## 2020-02-24 DIAGNOSIS — I129 Hypertensive chronic kidney disease with stage 1 through stage 4 chronic kidney disease, or unspecified chronic kidney disease: Secondary | ICD-10-CM

## 2020-02-24 DIAGNOSIS — E1169 Type 2 diabetes mellitus with other specified complication: Secondary | ICD-10-CM | POA: Diagnosis not present

## 2020-02-24 DIAGNOSIS — I4729 Other ventricular tachycardia: Secondary | ICD-10-CM

## 2020-02-24 LAB — CBC WITH DIFFERENTIAL/PLATELET
Abs Immature Granulocytes: 0.02 10*3/uL (ref 0.00–0.07)
Basophils Absolute: 0 10*3/uL (ref 0.0–0.1)
Basophils Relative: 1 %
Eosinophils Absolute: 0.2 10*3/uL (ref 0.0–0.5)
Eosinophils Relative: 3 %
HCT: 39.6 % (ref 36.0–46.0)
Hemoglobin: 12.4 g/dL (ref 12.0–15.0)
Immature Granulocytes: 0 %
Lymphocytes Relative: 24 %
Lymphs Abs: 1.3 10*3/uL (ref 0.7–4.0)
MCH: 29.7 pg (ref 26.0–34.0)
MCHC: 31.3 g/dL (ref 30.0–36.0)
MCV: 94.7 fL (ref 80.0–100.0)
Monocytes Absolute: 0.4 10*3/uL (ref 0.1–1.0)
Monocytes Relative: 7 %
Neutro Abs: 3.6 10*3/uL (ref 1.7–7.7)
Neutrophils Relative %: 65 %
Platelets: 184 10*3/uL (ref 150–400)
RBC: 4.18 MIL/uL (ref 3.87–5.11)
RDW: 15.4 % (ref 11.5–15.5)
WBC: 5.5 10*3/uL (ref 4.0–10.5)
nRBC: 0 % (ref 0.0–0.2)

## 2020-02-24 LAB — HEMOGLOBIN A1C
Hgb A1c MFr Bld: 7.6 % — ABNORMAL HIGH (ref 4.8–5.6)
Mean Plasma Glucose: 171.42 mg/dL

## 2020-02-24 LAB — TSH: TSH: 0.59 u[IU]/mL (ref 0.350–4.500)

## 2020-02-24 NOTE — Progress Notes (Signed)
Patient Care Team: Olin Hauser, DO as PCP - General (Family Medicine) Lavonia Dana, MD as Consulting Physician (Nephrology) Deboraha Sprang, MD as Consulting Physician (Cardiology) Edrick Kins, DPM as Consulting Physician (Podiatry) Vanita Ingles, RN as Case Manager (General Practice)   HPI  Renee Boyd is a 69 y.o. female Seen in followup for largely asymptomatic persistent atrial fibrillation.  She transferred care to here 7/17.  At that time, anticoagulation was initiated w Apixoban    She is s/p pacemaker insertion Medtronic 2014 for sinus node dysfunction and CHB   Previously exposed to amiodarone when started [employer].  Ended up with elevated INR and the amiodarone was discontinued.   Records and Results Reviewed Date Cr K Hgb LFTs TSH  10/17 1.36  8.8  0.304  2/18 1.73 4.9   0.6  6/18  1.3  11.1    1/20 1.2 4.3 11.3    1/21 1.32 3.2 9.6 48   4/21        DATE TEST EF   7/17 Echo   60-65 % Pulm HTN 64 (no E/E')  12/19  Echo  50-55% LVH severe (17/16)   PAsys 50-55   MR mild LAE(37/2.0/34)  1/20 CTA  CAD 2V   FFR > 0.8 LAD/RCA   Man    She was cardioverted 10/19 did not hold sinus rhythm but then reverted  She has noted weight loss over the last 6 months of about 20 pounds associated with nausea and anorexia.  This is been concurrent with moderate peripheral edema.  More dyspnea.    Underwent CTA imaging looking for coronary disease to consider antiarrhythmic therapy   Thromboembolic risk factors ( age -68, HTN-1, TIA/CVA-2, DM-1 , Gender-1) for a CHADSVASc Score of 6     Past Medical History:  Diagnosis Date  . Complete heart block ([place])    a. s/p MDT PPM 2006 with device generator replacement 2014; b. followed by Dr. Caryl Comes  . Diabetes ([place])   . GERD (gastroesophageal reflux disease)   . Hypertension   . Hypothyroid   . Pacemaker   . Persistent atrial fibrillation (HCC)    a. on Eliquis; b. CHADS2VASc 6 (HTN. age x  1, DM, TIA x 2, female); c. s/p DCCV 05/28/18  . Pulmonary hypertension ([place])    a. echo 2017: EF of 60-65%, no RWMA, mildly dilated left atrium, RVSF normal, PASP 64 mmHg  . TIA (transient ischemic attack)     Past Surgical History:  Procedure Laterality Date  . ABDOMINAL HYSTERECTOMY    . CARDIOVERSION N/A 05/28/2018   Procedure: CARDIOVERSION;  Surgeon: Minna Merritts, MD;  Location: [facility] ORS;  Service: Cardiovascular;  Laterality: N/A;  . CHOLECYSTECTOMY    . TONSILLECTOMY    . TUBAL LIGATION      Current Outpatient Medications  Medication Sig Dispense Refill  . acetaminophen (TYLENOL) 650 MG CR tablet Take by mouth.    Marland Kitchen allopurinol (ZYLOPRIM) 100 MG tablet TAKE 1 TABLET DAILY 90 tablet 3  . amiodarone (PACERONE) 200 MG tablet TAKE ONE-HALF (1/2) TABLET TWICE A DAY 60 tablet 0  . amLODipine (NORVASC) 10 MG tablet Take 1 tablet (10 mg total) by mouth daily. OVERDUE FOR FOLLOW UP. PLEASE CALL AND SCHEDULE-[phone]- 1st attempt 30 tablet 0  . atorvastatin (LIPITOR) 20 MG tablet TAKE 1 TABLET DAILY 90 tablet 3  . Baclofen 5 MG TABS TAKE 1 TABLET AT BEDTIME 90 tablet 3  .  CONTOUR NEXT TEST test strip Use to check blood sugar up to twice a day. 200 each 5  . diclofenac Sodium (VOLTAREN) 1 % GEL Apply topically 3 (three) times daily as needed.    . Dulaglutide 0.75 MG/0.5ML SOPN Inject into the skin.    Marland Kitchen ELIQUIS 5 MG TABS tablet TAKE 1 TABLET TWICE A DAY 180 tablet 1  . furosemide (LASIX) 20 MG tablet TAKE 2 TABLETS EVERY MORNING 180 tablet 2  . ibandronate (BONIVA) 150 MG tablet TAKE 1 TABLET EVERY 30 DAYS. TAKE IN THE MORNING WITH A FULL GLASS OF WATER, ON AN EMPTY STOMACH 3 tablet 3  . Ipratropium-Albuterol (COMBIVENT) 20-100 MCG/ACT AERS respimat Inhale 1 puff into the lungs every 6 (six) hours as needed for wheezing or shortness of breath. 4 g 2  . levothyroxine (SYNTHROID) 25 MCG tablet TAKE 1 TABLET DAILY BEFORE BREAKFAST 90 tablet 3  . lisinopril (PRINIVIL,ZESTRIL) 20 MG  tablet Take 1 tablet (20 mg total) by mouth daily. 90 tablet 3  . metoprolol succinate (TOPROL-XL) 100 MG 24 hr tablet TAKE 1 TABLET DAILY WITH OR IMMEDIATELY FOLLOWING A MEAL 90 tablet 3  . omeprazole (PRILOSEC) 20 MG capsule TAKE 1 CAPSULE TWICE A DAY BEFORE MEALS 180 capsule 3  . temazepam (RESTORIL) 30 MG capsule TAKE 1 CAPSULE BY MOUTH AT BEDTIME AS NEEDED SLEEP 30 capsule 2  . traMADol (ULTRAM) 50 MG tablet Take 1 tablet (50 mg total) by mouth 2 (two) times daily. 60 tablet 2   No current facility-administered medications for this visit.    Allergies  Allergen Reactions  . Morphine And Related Itching  . Percocet [Oxycodone-Acetaminophen] Other (See Comments)    Hallucinations       Review of Systems negative except from HPI and PMH  Physical Exam BP (!) 168/86 (BP Location: Left Arm, Patient Position: Sitting, Cuff Size: Normal)   Pulse 61   Ht 4\' 11"  (1.499 m)   Wt 157 lb 3.2 oz (71.3 kg)   SpO2 96%   BMI 31.75 kg/m  Well developed and well nourished in no acute distress HENT normal Neck supple with JVP 8 Clear Device pocket well healed; without hematoma or erythema.  There is no tethering  Regular rate and rhythm, n0 murmur Abd-soft with active BS No Clubbing cyanosis 2+ edema Skin-warm and dry A & Oriented  Grossly normal sensory and motor function  ECG atrial flutter with ventricular pacing   ECG AV pacing with frequent ventricular ectopy with a right bundle inferior axis morphology Assessment and  Plan  Atrial fibrillation/flutter long-term persistent  Ventricular tachycardia-nonsustained  Hypertension   Pacemaker  Medtronic   DM-nephropathy  Complete heart block  PVCs  Sleep disordered breathing and daytime somnolence  Persistent atrial flutter over the last month.  Failed to pace terminate in the office.  We will arrange for cardioversion.  Continue her amiodarone at the current doses.  We will check amiodarone surveillance  laboratories.  Evidence of fluid overload in the context of anorexia I worry about her liver as well as her renal function.  We will check labs today.  Hold off on diuretics until we have found out what her renal function is  Nausea and anorexia may be related to amiodarone or other GI processes.  Again checking labs and if it does not improve with restoration of sinus rhythm will need further evaluation by her PCP.

## 2020-02-24 NOTE — Patient Instructions (Addendum)
Medication Instructions:  - Your physician recommends that you continue on your current medications as directed. Please refer to the Current Medication list given to you today.  *If you need a refill on your cardiac medications before your next appointment, please call your pharmacy*   Lab Work: - see below  If you have labs (blood work) drawn today and your tests are completely normal, you will receive your results only by: Marland Kitchen MyChart Message (if you have MyChart) OR . A paper copy in the mail If you have any lab test that is abnormal or we need to change your treatment, we will call you to review the results.   Testing/Procedures: - Your physician has recommended that you have a Cardioversion (DCCV). Electrical Cardioversion uses a jolt of electricity to your heart either through paddles or wired patches attached to your chest. This is a controlled, usually prescheduled, procedure. Defibrillation is done under light anesthesia in the hospital, and you usually go home the day of the procedure. This is done to get your heart back into a normal rhythm. You are not awake for the procedure. Please see the instruction sheet given to you today.  You are scheduled for a Cardioversion on Wednesday 03/01/20 with Dr. Kate Sable.  Please arrive at the [facility] at 6:30 a.m. on the day of your procedure.  DIET INSTRUCTIONS:  Nothing to eat or drink after midnight the night before your procedure         1) Labs:  - Pre procedure lab work today: CMET/ CBC/ TSH [facility])  - Pre COVID swab: Monday 02/28/20 (8 am- 1 pm) Drive up to the [facility] entrance at [employer] will come out to the car to swab you  2) Medications:  You may take all of your regular medications the morning of your procedure with enough water to get them down safely unless listed below:  - HOLD lasix (furosemide) the morning of your procedure  3) Must have a responsible person to drive you  home.  4) Bring a current list of your medications and current insurance cards.    If you have any questions after you get home, please call the office at [phone]. Alvis Lemmings, RN, BSN  Follow-Up: At [facility], you and your health needs are our priority.  As part of our continuing mission to provide you with exceptional heart care, we have created designated Provider Care Teams.  These Care Teams include your primary Cardiologist (physician) and Advanced Practice Providers (APPs -  Physician Assistants and Nurse Practitioners) who all work together to provide you with the care you need, when you need it.  We recommend signing up for the patient portal called "MyChart".  Sign up information is provided on this After Visit Summary.  MyChart is used to connect with patients for Virtual Visits (Telemedicine).  Patients are able to view lab/test results, encounter notes, upcoming appointments, etc.  Non-urgent messages can be sent to your provider as well.   To learn more about what you can do with MyChart, go to [email or web address].    Your next appointment:   As scheduled  The format for your next appointment:   In Person  Provider:   Virl Axe, MD   Other Instructions n/a

## 2020-02-24 NOTE — H&P (View-Only) (Signed)
Patient Care Team: Olin Hauser, DO as PCP - General (Family Medicine) Lavonia Dana, MD as Consulting Physician (Nephrology) Deboraha Sprang, MD as Consulting Physician (Cardiology) Edrick Kins, DPM as Consulting Physician (Podiatry) Vanita Ingles, RN as Case Manager (General Practice)   HPI  Renee Boyd is a 69 y.o. female Seen in followup for largely asymptomatic persistent atrial fibrillation.  She transferred care to here 7/17.  At that time, anticoagulation was initiated w Apixoban    She is s/p pacemaker insertion Medtronic 2014 for sinus node dysfunction and CHB   Previously exposed to amiodarone when started [employer].  Ended up with elevated INR and the amiodarone was discontinued.   Records and Results Reviewed Date Cr K Hgb LFTs TSH  10/17 1.36  8.8  0.304  2/18 1.73 4.9   0.6  6/18  1.3  11.1    1/20 1.2 4.3 11.3    1/21 1.32 3.2 9.6 48   4/21        DATE TEST EF   7/17 Echo   60-65 % Pulm HTN 64 (no E/E')  12/19  Echo  50-55% LVH severe (17/16)   PAsys 50-55   MR mild LAE(37/2.0/34)  1/20 CTA  CAD 2V   FFR > 0.8 LAD/RCA   Man    She was cardioverted 10/19 did not hold sinus rhythm but then reverted  She has noted weight loss over the last 6 months of about 20 pounds associated with nausea and anorexia.  This is been concurrent with moderate peripheral edema.  More dyspnea.    Underwent CTA imaging looking for coronary disease to consider antiarrhythmic therapy   Thromboembolic risk factors ( age -40, HTN-1, TIA/CVA-2, DM-1 , Gender-1) for a CHADSVASc Score of 6     Past Medical History:  Diagnosis Date  . Complete heart block ([place])    a. s/p MDT PPM 2006 with device generator replacement 2014; b. followed by Dr. Caryl Comes  . Diabetes ([place])   . GERD (gastroesophageal reflux disease)   . Hypertension   . Hypothyroid   . Pacemaker   . Persistent atrial fibrillation ([facility])    a. on Eliquis; b. CHADS2VASc 6 (HTN. age x  1, DM, TIA x 2, female); c. s/p DCCV 05/28/18  . Pulmonary hypertension ([place])    a. echo 2017: EF of 60-65%, no RWMA, mildly dilated left atrium, RVSF normal, PASP 64 mmHg  . TIA (transient ischemic attack)     Past Surgical History:  Procedure Laterality Date  . ABDOMINAL HYSTERECTOMY    . CARDIOVERSION N/A 05/28/2018   Procedure: CARDIOVERSION;  Surgeon: Minna Merritts, MD;  Location: [facility] ORS;  Service: Cardiovascular;  Laterality: N/A;  . CHOLECYSTECTOMY    . TONSILLECTOMY    . TUBAL LIGATION      Current Outpatient Medications  Medication Sig Dispense Refill  . acetaminophen (TYLENOL) 650 MG CR tablet Take by mouth.    Marland Kitchen allopurinol (ZYLOPRIM) 100 MG tablet TAKE 1 TABLET DAILY 90 tablet 3  . amiodarone (PACERONE) 200 MG tablet TAKE ONE-HALF (1/2) TABLET TWICE A DAY 60 tablet 0  . amLODipine (NORVASC) 10 MG tablet Take 1 tablet (10 mg total) by mouth daily. OVERDUE FOR FOLLOW UP. PLEASE CALL AND SCHEDULE-[phone]- 1st attempt 30 tablet 0  . atorvastatin (LIPITOR) 20 MG tablet TAKE 1 TABLET DAILY 90 tablet 3  . Baclofen 5 MG TABS TAKE 1 TABLET AT BEDTIME 90 tablet 3  .  CONTOUR NEXT TEST test strip Use to check blood sugar up to twice a day. 200 each 5  . diclofenac Sodium (VOLTAREN) 1 % GEL Apply topically 3 (three) times daily as needed.    . Dulaglutide 0.75 MG/0.5ML SOPN Inject into the skin.    Marland Kitchen ELIQUIS 5 MG TABS tablet TAKE 1 TABLET TWICE A DAY 180 tablet 1  . furosemide (LASIX) 20 MG tablet TAKE 2 TABLETS EVERY MORNING 180 tablet 2  . ibandronate (BONIVA) 150 MG tablet TAKE 1 TABLET EVERY 30 DAYS. TAKE IN THE MORNING WITH A FULL GLASS OF WATER, ON AN EMPTY STOMACH 3 tablet 3  . Ipratropium-Albuterol (COMBIVENT) 20-100 MCG/ACT AERS respimat Inhale 1 puff into the lungs every 6 (six) hours as needed for wheezing or shortness of breath. 4 g 2  . levothyroxine (SYNTHROID) 25 MCG tablet TAKE 1 TABLET DAILY BEFORE BREAKFAST 90 tablet 3  . lisinopril (PRINIVIL,ZESTRIL) 20 MG  tablet Take 1 tablet (20 mg total) by mouth daily. 90 tablet 3  . metoprolol succinate (TOPROL-XL) 100 MG 24 hr tablet TAKE 1 TABLET DAILY WITH OR IMMEDIATELY FOLLOWING A MEAL 90 tablet 3  . omeprazole (PRILOSEC) 20 MG capsule TAKE 1 CAPSULE TWICE A DAY BEFORE MEALS 180 capsule 3  . temazepam (RESTORIL) 30 MG capsule TAKE 1 CAPSULE BY MOUTH AT BEDTIME AS NEEDED SLEEP 30 capsule 2  . traMADol (ULTRAM) 50 MG tablet Take 1 tablet (50 mg total) by mouth 2 (two) times daily. 60 tablet 2   No current facility-administered medications for this visit.    Allergies  Allergen Reactions  . Morphine And Related Itching  . Percocet [Oxycodone-Acetaminophen] Other (See Comments)    Hallucinations       Review of Systems negative except from HPI and PMH  Physical Exam BP (!) 168/86 (BP Location: Left Arm, Patient Position: Sitting, Cuff Size: Normal)   Pulse 61   Ht 4\' 11"  (1.499 m)   Wt 157 lb 3.2 oz (71.3 kg)   SpO2 96%   BMI 31.75 kg/m  Well developed and well nourished in no acute distress HENT normal Neck supple with JVP 8 Clear Device pocket well healed; without hematoma or erythema.  There is no tethering  Regular rate and rhythm, n0 murmur Abd-soft with active BS No Clubbing cyanosis 2+ edema Skin-warm and dry A & Oriented  Grossly normal sensory and motor function  ECG atrial flutter with ventricular pacing   ECG AV pacing with frequent ventricular ectopy with a right bundle inferior axis morphology Assessment and  Plan  Atrial fibrillation/flutter long-term persistent  Ventricular tachycardia-nonsustained  Hypertension   Pacemaker  Medtronic   DM-nephropathy  Complete heart block  PVCs  Sleep disordered breathing and daytime somnolence  Persistent atrial flutter over the last month.  Failed to pace terminate in the office.  We will arrange for cardioversion.  Continue her amiodarone at the current doses.  We will check amiodarone surveillance  laboratories.  Evidence of fluid overload in the context of anorexia I worry about her liver as well as her renal function.  We will check labs today.  Hold off on diuretics until we have found out what her renal function is  Nausea and anorexia may be related to amiodarone or other GI processes.  Again checking labs and if it does not improve with restoration of sinus rhythm will need further evaluation by her PCP.

## 2020-02-25 ENCOUNTER — Other Ambulatory Visit
Admission: RE | Admit: 2020-02-25 | Discharge: 2020-02-25 | Disposition: A | Discharge status: Home / Self Care | Payer: Medicare Other | Source: Ambulatory Visit | Attending: Internal Medicine | Admitting: Internal Medicine

## 2020-02-25 ENCOUNTER — Telehealth: Payer: Self-pay | Admitting: Family Medicine

## 2020-02-25 ENCOUNTER — Encounter: Payer: Self-pay | Admitting: Internal Medicine

## 2020-02-25 DIAGNOSIS — Z79899 Other long term (current) drug therapy: Secondary | ICD-10-CM | POA: Diagnosis not present

## 2020-02-25 DIAGNOSIS — N184 Chronic kidney disease, stage 4 (severe): Secondary | ICD-10-CM | POA: Insufficient documentation

## 2020-02-25 DIAGNOSIS — Z01812 Encounter for preprocedural laboratory examination: Secondary | ICD-10-CM | POA: Insufficient documentation

## 2020-02-25 DIAGNOSIS — E785 Hyperlipidemia, unspecified: Secondary | ICD-10-CM | POA: Insufficient documentation

## 2020-02-25 DIAGNOSIS — E1169 Type 2 diabetes mellitus with other specified complication: Secondary | ICD-10-CM | POA: Diagnosis not present

## 2020-02-25 DIAGNOSIS — I129 Hypertensive chronic kidney disease with stage 1 through stage 4 chronic kidney disease, or unspecified chronic kidney disease: Secondary | ICD-10-CM | POA: Diagnosis not present

## 2020-02-25 DIAGNOSIS — I4819 Other persistent atrial fibrillation: Secondary | ICD-10-CM | POA: Diagnosis not present

## 2020-02-25 LAB — LIPID PANEL
Cholesterol: 139 mg/dL (ref 0–200)
HDL: 50 mg/dL (ref >40)
LDL Cholesterol: 56 mg/dL (ref 0–99)
Total CHOL/HDL Ratio: 2.8 RATIO
Triglycerides: 167 mg/dL — ABNORMAL HIGH (ref <150)
VLDL: 33 mg/dL (ref 0–40)

## 2020-02-25 LAB — COMPREHENSIVE METABOLIC PANEL
ALT: 38 U/L (ref 0–44)
AST: 35 U/L (ref 15–41)
Albumin: 4.6 g/dL (ref 3.5–5.0)
Alkaline Phosphatase: 81 U/L (ref 38–126)
Anion gap: 14 (ref 5–15)
BUN: 25 mg/dL — ABNORMAL HIGH (ref 8–23)
CO2: 26 mmol/L (ref 22–32)
Calcium: 10.9 mg/dL — ABNORMAL HIGH (ref 8.9–10.3)
Chloride: 102 mmol/L (ref 98–111)
Creatinine, Ser: 2.37 mg/dL — ABNORMAL HIGH (ref 0.44–1.00)
GFR calc Af Amer: 23 mL/min — ABNORMAL LOW (ref >60)
GFR calc non Af Amer: 20 mL/min — ABNORMAL LOW (ref >60)
Glucose, Bld: 139 mg/dL — ABNORMAL HIGH (ref 70–99)
Potassium: 4.2 mmol/L (ref 3.5–5.1)
Sodium: 142 mmol/L (ref 135–145)
Total Bilirubin: 0.7 mg/dL (ref 0.3–1.2)
Total Protein: 7.8 g/dL (ref 6.5–8.1)

## 2020-02-25 NOTE — Progress Notes (Signed)
Tried to call pt when Cr came back elevated  Both tamadol and boniva, new meds are assoc with worsening renal funciton  Will stop  No answer

## 2020-02-25 NOTE — H&P (View-Only) (Signed)
Tried to call pt when Cr came back elevated  Both tamadol and boniva, new meds are assoc with worsening renal funciton  Will stop  No answer

## 2020-02-25 NOTE — Telephone Encounter (Signed)
Please notify patient that recent lab showed REDUCED Kidney function again (with elevated Creatinine on the lab result).  Dr Caryl Comes her cardiologist has recommended further evaluation and asked her to stop Boniva and Tramadol.  Please notify her to hold those meds.  She should follow-up promptly with Dr Juleen China her Kidney specialist next.  [name], [facility] 02/25/2020, 6:00 PM

## 2020-02-28 ENCOUNTER — Other Ambulatory Visit: Payer: Self-pay

## 2020-02-28 ENCOUNTER — Other Ambulatory Visit
Admission: RE | Admit: 2020-02-28 | Discharge: 2020-02-28 | Disposition: A | Discharge status: Home / Self Care | Payer: Medicare Other | Source: Ambulatory Visit | Attending: Internal Medicine | Admitting: Internal Medicine

## 2020-02-28 DIAGNOSIS — Z20822 Contact with and (suspected) exposure to covid-19: Secondary | ICD-10-CM | POA: Diagnosis not present

## 2020-02-28 DIAGNOSIS — Z01812 Encounter for preprocedural laboratory examination: Secondary | ICD-10-CM | POA: Diagnosis not present

## 2020-02-28 NOTE — Telephone Encounter (Signed)
Patient advised as per Dr Raliegh Ip, she only takes once tramadol for her arm/shoulder pain, suggest that as per Dr Raliegh Ip no Tramadol at all and she will try tylenol and if does not work out after few days she will call and schedule an appointment for shoulder injection, she had one in past and that was helpful as per patient.

## 2020-02-28 NOTE — Telephone Encounter (Signed)
Sounds good thanks  [name], [facility] 02/28/2020, 1:22 PM

## 2020-02-29 LAB — SARS CORONAVIRUS 2 (TAT 6-24 HRS): SARS Coronavirus 2: NEGATIVE

## 2020-03-01 ENCOUNTER — Other Ambulatory Visit: Payer: Self-pay

## 2020-03-01 ENCOUNTER — Ambulatory Visit: Payer: Medicare Other | Admitting: Anesthesiology

## 2020-03-01 ENCOUNTER — Ambulatory Visit
Admission: RE | Admit: 2020-03-01 | Discharge: 2020-03-01 | Disposition: A | Discharge status: Home / Self Care | Payer: Medicare Other | Attending: Cardiology | Admitting: Cardiology

## 2020-03-01 ENCOUNTER — Encounter: Payer: Self-pay | Admitting: Cardiology

## 2020-03-01 ENCOUNTER — Encounter
Admission: RE | Disposition: A | Discharge status: Home / Self Care | Payer: Medicare Other | Source: Home / Self Care | Attending: Cardiology

## 2020-03-01 DIAGNOSIS — I495 Sick sinus syndrome: Secondary | ICD-10-CM | POA: Diagnosis not present

## 2020-03-01 DIAGNOSIS — G4730 Sleep apnea, unspecified: Secondary | ICD-10-CM | POA: Insufficient documentation

## 2020-03-01 DIAGNOSIS — Z885 Allergy status to narcotic agent status: Secondary | ICD-10-CM | POA: Insufficient documentation

## 2020-03-01 DIAGNOSIS — I4819 Other persistent atrial fibrillation: Secondary | ICD-10-CM | POA: Insufficient documentation

## 2020-03-01 DIAGNOSIS — N184 Chronic kidney disease, stage 4 (severe): Secondary | ICD-10-CM | POA: Diagnosis not present

## 2020-03-01 DIAGNOSIS — I442 Atrioventricular block, complete: Secondary | ICD-10-CM | POA: Diagnosis not present

## 2020-03-01 DIAGNOSIS — E039 Hypothyroidism, unspecified: Secondary | ICD-10-CM | POA: Insufficient documentation

## 2020-03-01 DIAGNOSIS — Z8673 Personal history of transient ischemic attack (TIA), and cerebral infarction without residual deficits: Secondary | ICD-10-CM | POA: Diagnosis not present

## 2020-03-01 DIAGNOSIS — R400 Somnolence: Secondary | ICD-10-CM | POA: Insufficient documentation

## 2020-03-01 DIAGNOSIS — I472 Ventricular tachycardia: Secondary | ICD-10-CM | POA: Insufficient documentation

## 2020-03-01 DIAGNOSIS — I272 Pulmonary hypertension, unspecified: Secondary | ICD-10-CM | POA: Diagnosis not present

## 2020-03-01 DIAGNOSIS — I10 Essential (primary) hypertension: Secondary | ICD-10-CM | POA: Insufficient documentation

## 2020-03-01 DIAGNOSIS — K219 Gastro-esophageal reflux disease without esophagitis: Secondary | ICD-10-CM | POA: Diagnosis not present

## 2020-03-01 DIAGNOSIS — Z79899 Other long term (current) drug therapy: Secondary | ICD-10-CM | POA: Insufficient documentation

## 2020-03-01 DIAGNOSIS — Z950 Presence of cardiac pacemaker: Secondary | ICD-10-CM | POA: Diagnosis not present

## 2020-03-01 DIAGNOSIS — Z7901 Long term (current) use of anticoagulants: Secondary | ICD-10-CM | POA: Insufficient documentation

## 2020-03-01 DIAGNOSIS — Z79890 Hormone replacement therapy (postmenopausal): Secondary | ICD-10-CM | POA: Insufficient documentation

## 2020-03-01 DIAGNOSIS — I129 Hypertensive chronic kidney disease with stage 1 through stage 4 chronic kidney disease, or unspecified chronic kidney disease: Secondary | ICD-10-CM | POA: Diagnosis not present

## 2020-03-01 DIAGNOSIS — E1122 Type 2 diabetes mellitus with diabetic chronic kidney disease: Secondary | ICD-10-CM | POA: Diagnosis not present

## 2020-03-01 DIAGNOSIS — E119 Type 2 diabetes mellitus without complications: Secondary | ICD-10-CM | POA: Insufficient documentation

## 2020-03-01 DIAGNOSIS — I4892 Unspecified atrial flutter: Secondary | ICD-10-CM

## 2020-03-01 HISTORY — PX: CARDIOVERSION: SHX1299

## 2020-03-01 LAB — GLUCOSE, CAPILLARY: Glucose-Capillary: 144 mg/dL — ABNORMAL HIGH (ref 70–99)

## 2020-03-01 SURGERY — CARDIOVERSION
Anesthesia: General

## 2020-03-01 MED ORDER — PROPOFOL 10 MG/ML IV BOLUS
INTRAVENOUS | Status: AC
  Filled 2020-03-01: qty 20

## 2020-03-01 MED ORDER — LACTATED RINGERS IV SOLN: INTRAVENOUS | Status: DC | PRN

## 2020-03-01 MED ORDER — PROPOFOL 10 MG/ML IV BOLUS
INTRAVENOUS | Status: DC | PRN
  Administered 2020-03-01: 50 mg via INTRAVENOUS

## 2020-03-01 NOTE — Anesthesia Postprocedure Evaluation (Signed)
Anesthesia Post Note  Patient: Renee Boyd  Procedure(s) Performed: CARDIOVERSION (N/A )  Patient location during evaluation: PACU Anesthesia Type: General Level of consciousness: awake and alert and oriented Pain management: pain level controlled Vital Signs Assessment: post-procedure vital signs reviewed and stable Respiratory status: spontaneous breathing, nonlabored ventilation and respiratory function stable Cardiovascular status: blood pressure returned to baseline and stable Postop Assessment: no signs of nausea or vomiting Anesthetic complications: no   No complications documented.   Last Vitals:  Vitals:   03/01/20 0819 03/01/20 0825  BP:  (!) 104/58  Pulse: 79 73  Resp: 20 20  Temp:    SpO2: 100% 100%    Last Pain:  Vitals:   03/01/20 0825  TempSrc:   PainSc: 0-No pain                 Spurgeon Gancarz

## 2020-03-01 NOTE — Transfer of Care (Signed)
Immediate Anesthesia Transfer of Care Note  Patient: Renee Boyd  Procedure(s) Performed: CARDIOVERSION (N/A )  Patient Location: specials recovery  Anesthesia Type:General  Level of Consciousness: awake and alert   Airway & Oxygen Therapy: Patient Spontanous Breathing  Post-op Assessment: Report given to RN and Post -op Vital signs reviewed and stable  Post vital signs: Reviewed and stable  Last Vitals:  Vitals Value Taken Time  BP 104/63 03/01/20 0815  Temp    Pulse 79 03/01/20 0819  Resp 20 03/01/20 0819  SpO2 100 % 03/01/20 0819    Last Pain:  Vitals:   03/01/20 0744  TempSrc: Oral         Complications: No complications documented.

## 2020-03-01 NOTE — H&P (Signed)
INTERVAL HISTORY: Patient presents for DC cardioversion due to atrial flutter. Previous notes from the clinic reviewed by myself no changes noted at this time  Today's vitals and heart rates, EKG reviewed. Plan to proceed with cardioversion.  Signed, Kate Sable, M.D. 03/01/20 [place], [place]

## 2020-03-01 NOTE — Anesthesia Preprocedure Evaluation (Signed)
Anesthesia Evaluation  Patient identified by MRN, date of birth, ID band Patient awake    Reviewed: Allergy & Precautions, NPO status , Patient's Chart, lab work & pertinent test results  History of Anesthesia Complications Negative for: history of anesthetic complications  Airway Mallampati: II  TM Distance: >3 FB Neck ROM: Full    Dental  (+) Upper Dentures, Partial Lower   Pulmonary neg sleep apnea, neg COPD, former smoker,    breath sounds clear to auscultation- rhonchi (-) wheezing      Cardiovascular hypertension, Pt. on medications (-) CAD, (-) Past MI, (-) Cardiac Stents and (-) CABG + dysrhythmias Atrial Fibrillation + pacemaker (placed for CHB)  Rhythm:Regular Rate:Normal - Systolic murmurs and - Diastolic murmurs    Neuro/Psych neg Seizures TIAnegative psych ROS   GI/Hepatic Neg liver ROS, GERD  ,  Endo/Other  diabetesHypothyroidism   Renal/GU CRFRenal disease     Musculoskeletal negative musculoskeletal ROS (+)   Abdominal (+) + obese,   Peds  Hematology  (+) anemia ,   Anesthesia Other Findings Past Medical History: No date: Complete heart block (HCC)     Comment:  a. s/p MDT PPM 2006 with device generator replacement               2014; b. followed by Dr. Caryl Comes No date: Diabetes ([place]) No date: GERD (gastroesophageal reflux disease) No date: Hypertension No date: Hypothyroid No date: Pacemaker No date: Persistent atrial fibrillation ([place])     Comment:  a. on Eliquis; b. CHADS2VASc 6 (HTN. age x 1, DM, TIA x               2, female); c. s/p DCCV 05/28/18 No date: Pulmonary hypertension ([place])     Comment:  a. echo 2017: EF of 60-65%, no RWMA, mildly dilated left              atrium, RVSF normal, PASP 64 mmHg No date: TIA (transient ischemic attack)   Reproductive/Obstetrics                            Anesthesia Physical Anesthesia Plan  ASA: III  Anesthesia Plan:  General   Post-op Pain Management:    Induction: Intravenous  PONV Risk Score and Plan: 2 and Propofol infusion and Treatment may vary due to age or medical condition  Airway Management Planned: Natural Airway  Additional Equipment:   Intra-op Plan:   Post-operative Plan:   Informed Consent: I have reviewed the patients History and Physical, chart, labs and discussed the procedure including the risks, benefits and alternatives for the proposed anesthesia with the patient or authorized representative who has indicated his/her understanding and acceptance.     Dental advisory given  Plan Discussed with: CRNA and Anesthesiologist  Anesthesia Plan Comments:         Anesthesia Quick Evaluation

## 2020-03-01 NOTE — Procedures (Signed)
Cardioversion procedure note For atrial flutter.  Procedure Details:  Consent: Risks of procedure as well as the alternatives and risks of each were explained to the (patient/caregiver).  Consent for procedure obtained.  Time Out: Verified patient identification, verified procedure, site/side was marked, verified correct patient position, special equipment/implants available, medications/allergies/relevent history reviewed, required imaging and test results available.  Performed  Patient placed on cardiac monitor, pulse oximetry, supplemental oxygen as necessary.   Sedation given by aneasthesia team  Pacer pads placed anterior and posterior chest.   Cardioverted 1 time.   Cardioverted at  [place]. Synchronized biphasic Converted to NSR   Evaluation: Findings: Post procedure EKG shows: NSR Complications: None Patient did tolerate procedure well.  Time Spent Directly with the Patient:  45 minutes   Kate Sable, M.D.

## 2020-03-02 ENCOUNTER — Encounter: Payer: Self-pay | Admitting: Cardiology

## 2020-03-02 ENCOUNTER — Other Ambulatory Visit: Payer: Self-pay | Admitting: Internal Medicine

## 2020-03-02 NOTE — Telephone Encounter (Signed)
This is a [employer] pt 

## 2020-03-05 ENCOUNTER — Other Ambulatory Visit: Payer: Self-pay | Admitting: Internal Medicine

## 2020-03-07 ENCOUNTER — Other Ambulatory Visit: Payer: Self-pay | Admitting: *Deleted

## 2020-03-08 NOTE — H&P (Signed)
Interval hxp

## 2020-03-08 NOTE — Interval H&P Note (Signed)
History and Physical Interval Note:  03/08/2020 4:19 PM  Renee Boyd  has presented today for surgery, with the diagnosis of Cardioversion   Afib.  The various methods of treatment have been discussed with the patient and family. After consideration of risks, benefits and other options for treatment, the patient has consented to  Procedure(s): CARDIOVERSION (N/A) as a surgical intervention.  The patient's history has been reviewed, patient examined, no change in status, stable for surgery.  I have reviewed the patient's chart and labs.  Questions were answered to the patient's satisfaction.     Aaron Edelman [place]

## 2020-03-13 DIAGNOSIS — I10 Essential (primary) hypertension: Secondary | ICD-10-CM | POA: Diagnosis not present

## 2020-03-13 DIAGNOSIS — E1122 Type 2 diabetes mellitus with diabetic chronic kidney disease: Secondary | ICD-10-CM | POA: Diagnosis not present

## 2020-03-13 DIAGNOSIS — E1159 Type 2 diabetes mellitus with other circulatory complications: Secondary | ICD-10-CM | POA: Diagnosis not present

## 2020-03-13 DIAGNOSIS — E782 Mixed hyperlipidemia: Secondary | ICD-10-CM | POA: Diagnosis not present

## 2020-03-13 DIAGNOSIS — N184 Chronic kidney disease, stage 4 (severe): Secondary | ICD-10-CM | POA: Diagnosis not present

## 2020-03-14 ENCOUNTER — Ambulatory Visit (INDEPENDENT_AMBULATORY_CARE_PROVIDER_SITE_OTHER): Payer: Medicare Other

## 2020-03-14 VITALS — Ht 59.0 in | Wt 161.0 lb

## 2020-03-14 DIAGNOSIS — R809 Proteinuria, unspecified: Secondary | ICD-10-CM | POA: Diagnosis not present

## 2020-03-14 DIAGNOSIS — I129 Hypertensive chronic kidney disease with stage 1 through stage 4 chronic kidney disease, or unspecified chronic kidney disease: Secondary | ICD-10-CM | POA: Diagnosis not present

## 2020-03-14 DIAGNOSIS — E875 Hyperkalemia: Secondary | ICD-10-CM | POA: Diagnosis not present

## 2020-03-14 DIAGNOSIS — Z0000 Encounter for general adult medical examination without abnormal findings: Secondary | ICD-10-CM | POA: Diagnosis not present

## 2020-03-14 DIAGNOSIS — N184 Chronic kidney disease, stage 4 (severe): Secondary | ICD-10-CM | POA: Diagnosis not present

## 2020-03-14 DIAGNOSIS — N2581 Secondary hyperparathyroidism of renal origin: Secondary | ICD-10-CM | POA: Diagnosis not present

## 2020-03-14 DIAGNOSIS — E1122 Type 2 diabetes mellitus with diabetic chronic kidney disease: Secondary | ICD-10-CM | POA: Diagnosis not present

## 2020-03-14 NOTE — Patient Instructions (Signed)
[name] , Thank you for taking time to come for your Medicare Wellness Visit. I appreciate your ongoing commitment to your health goals. Please review the following plan we discussed and let me know if I can assist you in the future.   Screening recommendations/referrals: Colonoscopy: completed 07/05/2014 Mammogram: declines Bone Density: completed 01/13/2017 Recommended yearly ophthalmology/optometry visit for glaucoma screening and checkup Recommended yearly dental visit for hygiene and checkup  Vaccinations: Influenza vaccine: decline Pneumococcal vaccine: completed 09/16/2017 Tdap vaccine: completed 04/06/2015 Shingles vaccine: discussed   Covid-19:11/09/2019, 10/18/2019  Advanced directives: copy in chart  Conditions/risks identified: none  Next appointment: Follow up in one year for your annual wellness visit    Preventive Care 59 Years and Older, Female Preventive care refers to lifestyle choices and visits with your health care provider that can promote health and wellness. What does preventive care include?  A yearly physical exam. This is also called an annual well check.  Dental exams once or twice a year.  Routine eye exams. Ask your health care provider how often you should have your eyes checked.  Personal lifestyle choices, including:  Daily care of your teeth and gums.  Regular physical activity.  Eating a healthy diet.  Avoiding tobacco and drug use.  Limiting alcohol use.  Practicing safe sex.  Taking low-dose aspirin every day.  Taking vitamin and mineral supplements as recommended by your health care provider. What happens during an annual well check? The services and screenings done by your health care provider during your annual well check will depend on your age, overall health, lifestyle risk factors, and family history of disease. Counseling  Your health care provider may ask you questions about your:  Alcohol use.  Tobacco use.  Drug  use.  Emotional well-being.  Home and relationship well-being.  Sexual activity.  Eating habits.  History of falls.  Memory and ability to understand (cognition).  Work and work Statistician.  Reproductive health. Screening  You may have the following tests or measurements:  Height, weight, and BMI.  Blood pressure.  Lipid and cholesterol levels. These may be checked every 5 years, or more frequently if you are over 58 years old.  Skin check.  Lung cancer screening. You may have this screening every year starting at age 19 if you have a 30-pack-year history of smoking and currently smoke or have quit within the past 15 years.  Fecal occult blood test (FOBT) of the stool. You may have this test every year starting at age 21.  Flexible sigmoidoscopy or colonoscopy. You may have a sigmoidoscopy every 5 years or a colonoscopy every 10 years starting at age 66.  Hepatitis C blood test.  Hepatitis B blood test.  Sexually transmitted disease (STD) testing.  Diabetes screening. This is done by checking your blood sugar (glucose) after you have not eaten for a while (fasting). You may have this done every 1-3 years.  Bone density scan. This is done to screen for osteoporosis. You may have this done starting at age 4.  Mammogram. This may be done every 1-2 years. Talk to your health care provider about how often you should have regular mammograms. Talk with your health care provider about your test results, treatment options, and if necessary, the need for more tests. Vaccines  Your health care provider may recommend certain vaccines, such as:  Influenza vaccine. This is recommended every year.  Tetanus, diphtheria, and acellular pertussis (Tdap, Td) vaccine. You may need a Td booster every 10  years.  Zoster vaccine. You may need this after age 73.  Pneumococcal 13-valent conjugate (PCV13) vaccine. One dose is recommended after age 42.  Pneumococcal polysaccharide  (PPSV23) vaccine. One dose is recommended after age 70. Talk to your health care provider about which screenings and vaccines you need and how often you need them. This information is not intended to replace advice given to you by your health care provider. Make sure you discuss any questions you have with your health care provider. Document Released: 08/18/2015 Document Revised: 04/10/2016 Document Reviewed: 05/23/2015 Elsevier Interactive Patient Education  2017 [place] Prevention in the Home Falls can cause injuries. They can happen to people of all ages. There are many things you can do to make your home safe and to help prevent falls. What can I do on the outside of my home?  Regularly fix the edges of walkways and driveways and fix any cracks.  Remove anything that might make you trip as you walk through a door, such as a raised step or threshold.  Trim any bushes or trees on the path to your home.  Use bright outdoor lighting.  Clear any walking paths of anything that might make someone trip, such as rocks or tools.  Regularly check to see if handrails are loose or broken. Make sure that both sides of any steps have handrails.  Any raised decks and porches should have guardrails on the edges.  Have any leaves, snow, or ice cleared regularly.  Use sand or salt on walking paths during winter.  Clean up any spills in your garage right away. This includes oil or grease spills. What can I do in the bathroom?  Use night lights.  Install grab bars by the toilet and in the tub and shower. Do not use towel bars as grab bars.  Use non-skid mats or decals in the tub or shower.  If you need to sit down in the shower, use a plastic, non-slip stool.  Keep the floor dry. Clean up any water that spills on the floor as soon as it happens.  Remove soap buildup in the tub or shower regularly.  Attach bath mats securely with double-sided non-slip rug tape.  Do not have  throw rugs and other things on the floor that can make you trip. What can I do in the bedroom?  Use night lights.  Make sure that you have a light by your bed that is easy to reach.  Do not use any sheets or blankets that are too big for your bed. They should not hang down onto the floor.  Have a firm chair that has side arms. You can use this for support while you get dressed.  Do not have throw rugs and other things on the floor that can make you trip. What can I do in the kitchen?  Clean up any spills right away.  Avoid walking on wet floors.  Keep items that you use a lot in easy-to-reach places.  If you need to reach something above you, use a strong step stool that has a grab bar.  Keep electrical cords out of the way.  Do not use floor polish or wax that makes floors slippery. If you must use wax, use non-skid floor wax.  Do not have throw rugs and other things on the floor that can make you trip. What can I do with my stairs?  Do not leave any items on the stairs.  Make sure that  there are handrails on both sides of the stairs and use them. Fix handrails that are broken or loose. Make sure that handrails are as long as the stairways.  Check any carpeting to make sure that it is firmly attached to the stairs. Fix any carpet that is loose or worn.  Avoid having throw rugs at the top or bottom of the stairs. If you do have throw rugs, attach them to the floor with carpet tape.  Make sure that you have a light switch at the top of the stairs and the bottom of the stairs. If you do not have them, ask someone to add them for you. What else can I do to help prevent falls?  Wear shoes that:  Do not have high heels.  Have rubber bottoms.  Are comfortable and fit you well.  Are closed at the toe. Do not wear sandals.  If you use a stepladder:  Make sure that it is fully opened. Do not climb a closed stepladder.  Make sure that both sides of the stepladder are  locked into place.  Ask someone to hold it for you, if possible.  Clearly mark and make sure that you can see:  Any grab bars or handrails.  First and last steps.  Where the edge of each step is.  Use tools that help you move around (mobility aids) if they are needed. These include:  Canes.  Walkers.  Scooters.  Crutches.  Turn on the lights when you go into a dark area. Replace any light bulbs as soon as they burn out.  Set up your furniture so you have a clear path. Avoid moving your furniture around.  If any of your floors are uneven, fix them.  If there are any pets around you, be aware of where they are.  Review your medicines with your doctor. Some medicines can make you feel dizzy. This can increase your chance of falling. Ask your doctor what other things that you can do to help prevent falls. This information is not intended to replace advice given to you by your health care provider. Make sure you discuss any questions you have with your health care provider. Document Released: 05/18/2009 Document Revised: 12/28/2015 Document Reviewed: 08/26/2014 Elsevier Interactive Patient Education  2017 [employer].

## 2020-03-14 NOTE — Progress Notes (Signed)
I connected with [name] today by telephone and verified that I am speaking with the correct person using two identifiers. Location patient: home Location provider: work Persons participating in the virtual visit: Renee Boyd, [place] LPN.   I discussed the limitations, risks, security and privacy concerns of performing an evaluation and management service by telephone and the availability of in person appointments. I also discussed with the patient that there may be a patient responsible charge related to this service. The patient expressed understanding and verbally consented to this telephonic visit.    Interactive audio and video telecommunications were attempted between this provider and patient, however failed, due to patient having technical difficulties OR patient did not have access to video capability.  We continued and completed visit with audio only.    Vital signs may be patient reported or missing.   Subjective:   Renee Boyd is a 69 y.o. female who presents for Medicare Annual (Subsequent) preventive examination.  Review of Systems     Cardiac Risk Factors include: advanced age (>22men, >31 women);hypertension;diabetes mellitus;obesity (BMI >30kg/m2);sedentary lifestyle     Objective:    Today's Vitals   03/14/20 1356  Weight: 161 lb (73 kg)  Height: 4\' 11"  (1.499 m)   Body mass index is 32.52 kg/m.  Advanced Directives 03/14/2020 08/11/2019 08/10/2019 02/09/2019 05/28/2018 09/01/2017 12/10/2016  Does Patient Have a Medical Advance Directive? Yes Yes No Yes Yes Yes No  Type of Paramedic of [place];Living will Healthcare Power of Attorney - Living will;Healthcare Power of [place];Living will Living will;Healthcare Power of Attorney -  Does patient want to make changes to medical advance directive? - No - Patient declined - - No - Patient declined No - Patient declined -  Copy of [place] in Chart? Yes - validated most recent copy scanned in chart (See row information) - - Yes - validated most recent copy scanned in chart (See row information) No - copy requested - -  Would patient like information on creating a medical advance directive? - - - - - - No - Patient declined    Current Medications (verified) Outpatient Encounter Medications as of 03/14/2020  Medication Sig  . acetaminophen (TYLENOL) 650 MG CR tablet Take 1,300 mg by mouth daily as needed for pain.   Marland Kitchen allopurinol (ZYLOPRIM) 100 MG tablet TAKE 1 TABLET DAILY (Patient taking differently: Take 100 mg by mouth daily. )  . amiodarone (PACERONE) 200 MG tablet TAKE ONE-HALF (1/2) TABLET TWICE A DAY  . amLODipine (NORVASC) 10 MG tablet Take 1 tablet (10 mg total) by mouth daily. OVERDUE FOR FOLLOW UP. PLEASE CALL AND SCHEDULE-[phone]- 1st attempt  . atorvastatin (LIPITOR) 20 MG tablet Take 1 tablet (20 mg total) by mouth daily.  . Baclofen 5 MG TABS TAKE 1 TABLET AT BEDTIME (Patient taking differently: Take 5 mg by mouth at bedtime. )  . calcitRIOL (ROCALTROL) 0.25 MCG capsule Take 0.25 mcg by mouth daily.  . CONTOUR NEXT TEST test strip Use to check blood sugar up to twice a day.  . Dulaglutide 0.75 MG/0.5ML SOPN Inject 0.75 mg into the skin once a week. Taking 2 injections until new prescription comes  . ELIQUIS 5 MG TABS tablet TAKE 1 TABLET TWICE A DAY (Patient taking differently: Take 5 mg by mouth 2 (two) times daily. )  . furosemide (LASIX) 20 MG tablet TAKE 2 TABLETS EVERY MORNING (Patient taking differently: Take 40 mg by mouth daily. )  .  gabapentin (NEURONTIN) 300 MG capsule Take 300 mg by mouth daily.  . Ipratropium-Albuterol (COMBIVENT) 20-100 MCG/ACT AERS respimat Inhale 1 puff into the lungs every 6 (six) hours as needed for wheezing or shortness of breath.  . levothyroxine (SYNTHROID) 25 MCG tablet TAKE 1 TABLET DAILY BEFORE BREAKFAST (Patient taking differently: Take 25 mcg by mouth daily before  breakfast. )  . lisinopril (PRINIVIL,ZESTRIL) 20 MG tablet Take 1 tablet (20 mg total) by mouth daily.  . metoprolol succinate (TOPROL-XL) 100 MG 24 hr tablet TAKE 1 TABLET DAILY WITH OR IMMEDIATELY FOLLOWING A MEAL (Patient taking differently: Take 100 mg by mouth daily. )  . omeprazole (PRILOSEC) 20 MG capsule TAKE 1 CAPSULE TWICE A DAY BEFORE MEALS (Patient taking differently: Take 20 mg by mouth 2 (two) times daily before a meal. )  . temazepam (RESTORIL) 30 MG capsule TAKE 1 CAPSULE BY MOUTH AT BEDTIME AS NEEDED SLEEP (Patient taking differently: Take 30 mg by mouth at bedtime. )  . diclofenac Sodium (VOLTAREN) 1 % GEL Apply 2 g topically 3 (three) times daily as needed.  (Patient not taking: Reported on 03/01/2020)   No facility-administered encounter medications on file as of 03/14/2020.    Allergies (verified) Morphine and related and Percocet [oxycodone-acetaminophen]   History: Past Medical History:  Diagnosis Date  . Complete heart block ([place])    a. s/p MDT PPM 2006 with device generator replacement 2014; b. followed by Dr. Caryl Comes  . Diabetes ([place])   . GERD (gastroesophageal reflux disease)   . Hypertension   . Hypothyroid   . Pacemaker   . Persistent atrial fibrillation ([facility])    a. on Eliquis; b. CHADS2VASc 6 (HTN. age x 1, DM, TIA x 2, female); c. s/p DCCV 05/28/18  . Pulmonary hypertension ([place])    a. echo 2017: EF of 60-65%, no RWMA, mildly dilated left atrium, RVSF normal, PASP 64 mmHg  . TIA (transient ischemic attack)    Past Surgical History:  Procedure Laterality Date  . ABDOMINAL HYSTERECTOMY    . CARDIOVERSION N/A 05/28/2018   Procedure: CARDIOVERSION;  Surgeon: Minna Merritts, MD;  Location: [facility] ORS;  Service: Cardiovascular;  Laterality: N/A;  . CARDIOVERSION N/A 03/01/2020   Procedure: CARDIOVERSION;  Surgeon: Kate Sable, MD;  Location: [facility] ORS;  Service: Cardiovascular;  Laterality: N/A;  . CHOLECYSTECTOMY    . TONSILLECTOMY    . TUBAL LIGATION      Family History  Problem Relation Age of Onset  . Hypertension Other   . Heart disease Mother        MI  . Alcohol abuse Father   . Autism Son   . Heart disease Maternal Grandmother   . Cancer Son        colon  . Miscarriages / Stillbirths Maternal Aunt    Social History   Socioeconomic History  . Marital status: Divorced    Spouse name: Not on file  . Number of children: 3  . Years of education: Not on file  . Highest education level: High school graduate  Occupational History  . Occupation: retired  Tobacco Use  . Smoking status: Former Smoker    Packs/day: 2.00    Years: 40.00    Pack years: 80.00    Types: Cigarettes    Quit date: 06/27/2005    Years since quitting: 14.7  . Smokeless tobacco: Former Systems developer  . Tobacco comment: quit Jun 27 2005  Vaping Use  . Vaping Use: Never used  Substance and Sexual Activity  .  Alcohol use: No  . Drug use: No  . Sexual activity: Not Currently  Other Topics Concern  . Not on file  Social History Narrative   Lives by self.   Social Determinants of Health   Financial Resource Strain: Low Risk   . Difficulty of Paying Living Expenses: Not hard at all  Food Insecurity: No Food Insecurity  . Worried About Charity fundraiser in the Last Year: Never true  . Ran Out of Food in the Last Year: Never true  Transportation Needs: No Transportation Needs  . Lack of Transportation (Medical): No  . Lack of Transportation (Non-Medical): No  Physical Activity: Inactive  . Days of Exercise per Week: 0 days  . Minutes of Exercise per Session: 0 min  Stress: No Stress Concern Present  . Feeling of Stress : Not at all  Social Connections:   . Frequency of Communication with Friends and Family:   . Frequency of Social Gatherings with Friends and Family:   . Attends Religious Services:   . Active Member of Clubs or Organizations:   . Attends Archivist Meetings:   Marland Kitchen Marital Status:     Tobacco Counseling Counseling  given: Not Answered Comment: quit Jun 27 2005   Clinical Intake:  Pre-visit preparation completed: Yes  Pain : No/denies pain     Nutritional Status: BMI > 30  Obese Nutritional Risks: Nausea/ vomitting/ diarrhea (nausea and vomiting prior to cardioversion, resolved) Diabetes: Yes  How often do you need to have someone help you when you read instructions, pamphlets, or other written materials from your doctor or pharmacy?: 1 - Never What is the last grade level you completed in school?: 12th grade  Diabetic? Yes Nutrition Risk Assessment:  Has the patient had any N/V/D within the last 2 months?  No  Does the patient have any non-healing wounds?  No  Has the patient had any unintentional weight loss or weight gain?  No   Diabetes:  Is the patient diabetic?  Yes  If diabetic, was a CBG obtained today?  No  Did the patient bring in their glucometer from home?  No  How often do you monitor your CBG's? daily.   Financial Strains and Diabetes Management:  Are you having any financial strains with the device, your supplies or your medication? No .  Does the patient want to be seen by Chronic Care Management for management of their diabetes?  No  Would the patient like to be referred to a Nutritionist or for Diabetic Management?  No   Diabetic Exams:  Diabetic Eye Exam: Completed 01/25/2020 Diabetic Foot Exam: Completed 05/04/2019   Interpreter Needed?: No  Information entered by :: NAllen LPN   Activities of Daily Living In your present state of health, do you have any difficulty performing the following activities: 03/14/2020 08/11/2019  Hearing? N N  Vision? N N  Difficulty concentrating or making decisions? N N  Walking or climbing stairs? N Y  Dressing or bathing? N N  Doing errands, shopping? N N  Preparing Food and eating ? N -  Using the Toilet? N -  In the past six months, have you accidently leaked urine? N -  Do you have problems with loss of bowel control?  N -  Managing your Medications? N -  Managing your Finances? N -  Housekeeping or managing your Housekeeping? N -  Some recent data might be hidden    Patient Care Team: Olin Hauser, DO  as PCP - General (Family Medicine) Lavonia Dana, MD as Consulting Physician (Nephrology) Deboraha Sprang, MD as Consulting Physician (Cardiology) Edrick Kins, DPM as Consulting Physician (Podiatry) Vanita Ingles, RN as Case Manager (General Practice)  Indicate any recent Medical Services you may have received from other than [facility] providers in the past year (date may be approximate).     Assessment:   This is a routine wellness examination for [place].  Hearing/Vision screen  Hearing Screening   125Hz  250Hz  500Hz  1000Hz  2000Hz  3000Hz  4000Hz  6000Hz  8000Hz   Right ear:           Left ear:           Vision Screening Comments: Regular eye exams, Dr. Salomon Fick  Dietary issues and exercise activities discussed: Current Exercise Habits: The patient does not participate in regular exercise at present  Goals    . DIET - INCREASE WATER INTAKE     Recommend drinking at least 6- 8 glasses of water a day     . Patient Stated     03/14/2020,wants to get sugars under control      Depression Screen PHQ 2/9 Scores 03/14/2020 12/14/2019 02/09/2019 01/28/2019 07/01/2018 03/27/2018 12/19/2017  PHQ - 2 Score 0 0 1 1 0 0 0  PHQ- 9 Score - - - 2 - - -    Fall Risk Fall Risk  03/14/2020 12/14/2019 02/09/2019 07/01/2018 03/27/2018  Falls in the past year? 1 0 0 0 No  Comment step off a step, stepped wrong - - - -  Number falls in past yr: 1 0 - - -  Comment - - - - -  Injury with Fall? 0 0 - - -  Risk for fall due to : Impaired balance/gait;Medication side effect - - - -  Follow up Falls evaluation completed;Education provided;Falls prevention discussed Falls evaluation completed - Falls evaluation completed -    Any stairs in or around the home? Yes  If so, are there any without handrails?  Yes  Home free of loose throw rugs in walkways, pet beds, electrical cords, etc? Yes  Adequate lighting in your home to reduce risk of falls? Yes   ASSISTIVE DEVICES UTILIZED TO PREVENT FALLS:  Life alert? No  Use of a cane, walker or w/c? No  Grab bars in the bathroom? No  Shower chair or bench in shower? No  Elevated toilet seat or a handicapped toilet? Yes   TIMED UP AND GO:  Was the test performed? No .    Cognitive Function:     6CIT Screen 03/14/2020 02/09/2019  What Year? 0 points 0 points  What month? 0 points 0 points  What time? 0 points 0 points  Count back from 20 2 points 0 points  Months in reverse 0 points 0 points  Repeat phrase 2 points 0 points  Total Score 4 0    Immunizations Immunization History  Administered Date(s) Administered  . PFIZER SARS-COV-2 Vaccination 10/18/2019, 11/09/2019  . Pneumococcal Conjugate-13 09/10/2016  . Pneumococcal Polysaccharide-23 09/16/2017  . Tdap 04/06/2015    TDAP status: Up to date Flu Vaccine status: Declined, Education has been provided regarding the importance of this vaccine but patient still declined. Advised may receive this vaccine at local pharmacy or Health Dept. Aware to provide a copy of the vaccination record if obtained from local pharmacy or Health Dept. Verbalized acceptance and understanding. Pneumococcal vaccine status: Up to date Covid-19 vaccine status: Completed vaccines  Qualifies for Shingles Vaccine? Yes  Zostavax completed No   Shingrix Completed?: No.    Education has been provided regarding the importance of this vaccine. Patient has been advised to call insurance company to determine out of pocket expense if they have not yet received this vaccine. Advised may also receive vaccine at local pharmacy or Health Dept. Verbalized acceptance and understanding.  Screening Tests Health Maintenance  Topic Date Due  . MAMMOGRAM  Never done  . INFLUENZA VACCINE  03/05/2020  . FOOT EXAM  05/03/2020    . HEMOGLOBIN A1C  08/26/2020  . OPHTHALMOLOGY EXAM  01/24/2021  . COLONOSCOPY  07/05/2024  . TETANUS/TDAP  04/05/2025  . DEXA SCAN  Completed  . COVID-19 Vaccine  Completed  . Hepatitis C Screening  Completed  . PNA vac Low Risk Adult  Completed    Health Maintenance  Health Maintenance Due  Topic Date Due  . MAMMOGRAM  Never done  . INFLUENZA VACCINE  03/05/2020    Colorectal cancer screening: Completed 07/05/2014. Repeat every 10 years Mammogram status: Decline Bone Density status: Completed 01/13/2017.   Lung Cancer Screening: (Low Dose CT Chest recommended if Age 56-80 years, 30 pack-year currently smoking OR have quit w/in 15years.) does not qualify.   Lung Cancer Screening Referral: no   Additional Screening:  Hepatitis C Screening: does qualify; Completed 09/10/2016  Vision Screening: Recommended annual ophthalmology exams for early detection of glaucoma and other disorders of the eye. Is the patient up to date with their annual eye exam?  Yes  Who is the provider or what is the name of the office in which the patient attends annual eye exams? Dr. Virgia Land If pt is not established with a provider, would they like to be referred to a provider to establish care? No .   Dental Screening: Recommended annual dental exams for proper oral hygiene  Community Resource Referral / Chronic Care Management: CRR required this visit?  No   CCM required this visit?  No      Plan:     I have personally reviewed and noted the following in the patient's chart:   . Medical and social history . Use of alcohol, tobacco or illicit drugs  . Current medications and supplements . Functional ability and status . Nutritional status . Physical activity . Advanced directives . List of other physicians . Hospitalizations, surgeries, and ER visits in previous 12 months . Vitals . Screenings to include cognitive, depression, and falls . Referrals and appointments  In  addition, I have reviewed and discussed with patient certain preventive protocols, quality metrics, and best practice recommendations. A written personalized care plan for preventive services as well as general preventive health recommendations were provided to patient.   Due to this being a telephonic visit, the after visit summary with patients personalized plan was offered to patient via mail or my-chart. Patient would like to access on my-chart  Kellie Simmering, LPN   1/57/2620   Nurse Notes: Patient states that she has started taking gabapentin 300 mg again once daily.

## 2020-03-15 ENCOUNTER — Telehealth: Payer: Self-pay | Admitting: Internal Medicine

## 2020-03-15 NOTE — Telephone Encounter (Signed)
I was contacted by Dr. Caryl Comes this morning by phone. He asked me if the patient had had a follow up labs done for her renal function.   I advised Dr. Caryl Comes when I spoke with her on 03/07/20, the patient told me she was supposed to follow up with nephrology in ~ 2 weeks and have lab work ~ 8/10.  I advised Dr. Caryl Comes I would call the patient to follow up and see if her bmp has been repeated.  I attempted to call the patient at her preferred contact #.  No answer- no voice mail box is set up. I will call the patient at at later time.

## 2020-03-15 NOTE — Telephone Encounter (Signed)
Patient returning call   She is not sure who called.

## 2020-03-15 NOTE — Telephone Encounter (Signed)
I called and spoke with the patient. I advised her Dr. Caryl Comes wanted to me call and follow up with her regarding her repeat BMP.  The patient states she had her lab work for Dr. Juleen China done yesterday. She is scheduled to see him on 03/22/20. I advised I would call over to see if I can get her BUN/ Creatinine numbers from yesterday.  The patient also advised she is having some swelling, but she is trying to cut back on her salt.  She states her swelling does improve somewhat with elevation of her lower extremities. She confirms she is still taking lasix 20 mg- 2 tablets (40 mg) once daily. I advised her that her swelling may be more venous in nature and that an increase in her diuretic may not help her situation. The patient feels she is stable. She will review with Dr. Juleen China next week.  She has been advised to decrease her sodium intake and elevate her lower extremities when she is able. She will call us back if needed, but otherwise we will see her back no 04/04/20.  I did attempt to call Dr. Juleen China [phone]. No answer- I left a message for Dr. Assunta Gambles nurse to please call back with her BUN/ creatinine readings from 03/14/20.

## 2020-03-16 NOTE — Telephone Encounter (Signed)
I called and spoke with York Cerise, Dr. Assunta Gambles nurse. She advised the patient's labs have resulted and should be visible in Epic [facility]) now, but her her renal function has worsened a little bit.  As of 8/10/2: BUN- 32/ Creatinine- 3.0  The patient is scheduled for follow up on 03/22/20 at 9:00 am with Dr. Juleen China.   Will forward to Dr. Caryl Comes as an Juluis Rainier.

## 2020-03-16 NOTE — Interval H&P Note (Signed)
History and Physical Interval Note:  03/16/2020 5:05 PM  Renee Boyd  has presented today for surgery, with the diagnosis of Cardioversion   Afib.  The various methods of treatment have been discussed with the patient and family. After consideration of risks, benefits and other options for treatment, the patient has consented to  Procedure(s): CARDIOVERSION (N/A) as a surgical intervention.  The patient's history has been reviewed, patient examined, no change in status, stable for surgery.  I have reviewed the patient's chart and labs.  Questions were answered to the patient's satisfaction.     Aaron Edelman Agbor-Etang

## 2020-03-16 NOTE — Telephone Encounter (Signed)
Desiree from [employer] returning your call.

## 2020-03-18 NOTE — Telephone Encounter (Signed)
Heather °Thanks SK   °

## 2020-03-20 ENCOUNTER — Other Ambulatory Visit: Payer: Self-pay | Admitting: Internal Medicine

## 2020-03-22 DIAGNOSIS — E1122 Type 2 diabetes mellitus with diabetic chronic kidney disease: Secondary | ICD-10-CM | POA: Diagnosis not present

## 2020-03-22 DIAGNOSIS — R809 Proteinuria, unspecified: Secondary | ICD-10-CM | POA: Diagnosis not present

## 2020-03-22 DIAGNOSIS — N2581 Secondary hyperparathyroidism of renal origin: Secondary | ICD-10-CM | POA: Diagnosis not present

## 2020-03-22 DIAGNOSIS — E875 Hyperkalemia: Secondary | ICD-10-CM | POA: Diagnosis not present

## 2020-03-22 DIAGNOSIS — N184 Chronic kidney disease, stage 4 (severe): Secondary | ICD-10-CM | POA: Diagnosis not present

## 2020-03-22 DIAGNOSIS — I129 Hypertensive chronic kidney disease with stage 1 through stage 4 chronic kidney disease, or unspecified chronic kidney disease: Secondary | ICD-10-CM | POA: Diagnosis not present

## 2020-03-22 NOTE — H&P (Signed)
History and Physical Interval Note:  03/22/2020 2:19 PM  Aloha SUI KASPAREK  has presented today for surgery, with the diagnosis of Cardioversion   Afib.  The various methods of treatment have been discussed with the patient and family. After consideration of risks, benefits and other options for treatment, the patient has consented to  Procedure(s): CARDIOVERSION (N/A) as a surgical intervention.  The patient's history has been reviewed, patient examined, no change in status, stable for surgery.  I have reviewed the patient's chart and labs.  Questions were answered to the patient's satisfaction.     Aaron Edelman Agbor-Etang

## 2020-03-22 NOTE — Interval H&P Note (Signed)
History and Physical Interval Note:  03/22/2020 2:19 PM  Renee Boyd  has presented today for surgery, with the diagnosis of Cardioversion   Afib.  The various methods of treatment have been discussed with the patient and family. After consideration of risks, benefits and other options for treatment, the patient has consented to  Procedure(s): CARDIOVERSION (N/A) as a surgical intervention.  The patient's history has been reviewed, patient examined, no change in status, stable for surgery.  I have reviewed the patient's chart and labs.  Questions were answered to the patient's satisfaction.     Aaron Edelman Agbor-Etang

## 2020-04-04 ENCOUNTER — Ambulatory Visit (INDEPENDENT_AMBULATORY_CARE_PROVIDER_SITE_OTHER): Payer: Medicare Other | Admitting: Internal Medicine

## 2020-04-04 ENCOUNTER — Other Ambulatory Visit: Payer: Self-pay | Admitting: Family Medicine

## 2020-04-04 ENCOUNTER — Other Ambulatory Visit: Payer: Self-pay

## 2020-04-04 ENCOUNTER — Encounter: Payer: Self-pay | Admitting: Internal Medicine

## 2020-04-04 VITALS — BP 154/90 | HR 88 | Ht 59.0 in | Wt 164.1 lb

## 2020-04-04 DIAGNOSIS — I442 Atrioventricular block, complete: Secondary | ICD-10-CM

## 2020-04-04 DIAGNOSIS — I4729 Other ventricular tachycardia: Secondary | ICD-10-CM

## 2020-04-04 DIAGNOSIS — Z950 Presence of cardiac pacemaker: Secondary | ICD-10-CM | POA: Diagnosis not present

## 2020-04-04 DIAGNOSIS — I10 Essential (primary) hypertension: Secondary | ICD-10-CM | POA: Diagnosis not present

## 2020-04-04 DIAGNOSIS — I4819 Other persistent atrial fibrillation: Secondary | ICD-10-CM | POA: Diagnosis not present

## 2020-04-04 DIAGNOSIS — I472 Ventricular tachycardia: Secondary | ICD-10-CM

## 2020-04-04 DIAGNOSIS — Z79899 Other long term (current) drug therapy: Secondary | ICD-10-CM

## 2020-04-04 MED ORDER — AMLODIPINE BESYLATE 10 MG PO TABS: 10.0000 mg | ORAL_TABLET | Freq: Every day | ORAL | 1 refills | Status: DC

## 2020-04-04 NOTE — Progress Notes (Signed)
Patient Care Team: Olin Hauser, DO as PCP - General (Family Medicine) Lavonia Dana, MD as Consulting Physician (Nephrology) Deboraha Sprang, MD as Consulting Physician (Cardiology) Edrick Kins, DPM as Consulting Physician (Podiatry) Vanita Ingles, RN as Case Manager (General Practice)   HPI  Renee Boyd is a 69 y.o. female Seen in followup for largely asymptomatic persistent atrial fibrillation.  She transferred care to here 7/17.  At that time, anticoagulation was initiated w Apixoban    She is s/p pacemaker insertion Medtronic 2014 for sinus node dysfunction and CHB   Previously exposed to amiodarone when started [employer].  Ended up with elevated INR and the amiodarone was discontinued.   Records and Results Reviewed Date Cr K Hgb LFTs TSH  10/17 1.36  8.8  0.304  2/18 1.73 4.9   0.6  6/18  1.3  11.1    1/20 1.2 4.3 11.3    1/21 1.32 3.2 9.6 48   7/21 2.37 4.2 12.4 38 0.59  8/21 2.64 (CE)       DATE TEST EF   7/17 Echo   60-65 % Pulm HTN 64 (no E/E')  12/19  Echo  50-55% LVH severe (17/16)   PAsys 50-55   MR mild LAE(37/2.0/34)  1/20 CTA  CAD 2V   FFR > 0.8 LAD/RCA        She was cardioverted 10/19 did not hold sinus rhythm but then reverted  Cardioverted about a month ago feels much better in sinus rhythm.  Nausea and anorexia have abated.  Weight is stable.  Continues with modest edema.  Dyspnea is improved as noted.  Unfortunately, her renal functions continue to worsen but she is under the care of nephrology.  Blood pressure remains elevated; some peripheral edema.   Thromboembolic risk factors ( age -35, HTN-1, TIA/CVA-2, DM-1 , Gender-1) for a CHADSVASc Score of 6     Past Medical History:  Diagnosis Date  . Complete heart block ([place])    a. s/p MDT PPM 2006 with device generator replacement 2014; b. followed by Dr. Caryl Comes  . Diabetes ([place])   . GERD (gastroesophageal reflux disease)   . Hypertension   . Hypothyroid    . Pacemaker   . Persistent atrial fibrillation (HCC)    a. on Eliquis; b. CHADS2VASc 6 (HTN. age x 1, DM, TIA x 2, female); c. s/p DCCV 05/28/18  . Pulmonary hypertension ([place])    a. echo 2017: EF of 60-65%, no RWMA, mildly dilated left atrium, RVSF normal, PASP 64 mmHg  . TIA (transient ischemic attack)     Past Surgical History:  Procedure Laterality Date  . ABDOMINAL HYSTERECTOMY    . CARDIOVERSION N/A 05/28/2018   Procedure: CARDIOVERSION;  Surgeon: Minna Merritts, MD;  Location: [facility] ORS;  Service: Cardiovascular;  Laterality: N/A;  . CARDIOVERSION N/A 03/01/2020   Procedure: CARDIOVERSION;  Surgeon: Kate Sable, MD;  Location: [facility] ORS;  Service: Cardiovascular;  Laterality: N/A;  . CHOLECYSTECTOMY    . TONSILLECTOMY    . TUBAL LIGATION      Current Outpatient Medications  Medication Sig Dispense Refill  . acetaminophen (TYLENOL) 650 MG CR tablet Take 1,300 mg by mouth daily as needed for pain.     Marland Kitchen allopurinol (ZYLOPRIM) 100 MG tablet Take 100 mg by mouth daily.    Marland Kitchen amiodarone (PACERONE) 200 MG tablet TAKE ONE-HALF (1/2) TABLET TWICE A DAY 90 tablet 0  . amLODipine (NORVASC) 10 MG  tablet Take 1 tablet (10 mg total) by mouth daily. OVERDUE FOR FOLLOW UP. PLEASE CALL AND SCHEDULE-[phone]- 1st attempt 30 tablet 0  . apixaban (ELIQUIS) 5 MG TABS tablet Take 5 mg by mouth 2 (two) times daily.    Marland Kitchen atorvastatin (LIPITOR) 20 MG tablet Take 1 tablet (20 mg total) by mouth daily. 90 tablet 3  . Baclofen 5 MG TABS Take 1 tablet by mouth at bedtime.    . calcitRIOL (ROCALTROL) 0.25 MCG capsule Take 0.25 mcg by mouth daily.    . CONTOUR NEXT TEST test strip Use to check blood sugar up to twice a day. 200 each 5  . Dulaglutide 0.75 MG/0.5ML SOPN Inject 0.75 mg into the skin once a week. Taking 2 injections until new prescription comes    . furosemide (LASIX) 20 MG tablet Take 40 mg by mouth daily.    Marland Kitchen gabapentin (NEURONTIN) 300 MG capsule Take 300 mg by mouth daily.     . Ipratropium-Albuterol (COMBIVENT) 20-100 MCG/ACT AERS respimat Inhale 1 puff into the lungs every 6 (six) hours as needed for wheezing or shortness of breath. 4 g 2  . levothyroxine (SYNTHROID) 25 MCG tablet Take 25 mcg by mouth daily before breakfast.    . lisinopril (PRINIVIL,ZESTRIL) 20 MG tablet Take 1 tablet (20 mg total) by mouth daily. 90 tablet 3  . metoprolol succinate (TOPROL-XL) 100 MG 24 hr tablet TAKE 1 TABLET DAILY WITH OR IMMEDIATELY FOLLOWING A MEAL 90 tablet 3  . omeprazole (PRILOSEC) 20 MG capsule Take 20 mg by mouth 2 (two) times daily before a meal.    . temazepam (RESTORIL) 30 MG capsule Take 30 mg by mouth at bedtime as needed for sleep.    Marland Kitchen allopurinol (ZYLOPRIM) 100 MG tablet TAKE 1 TABLET DAILY (Patient taking differently: Take 100 mg by mouth daily. ) 90 tablet 3  . Baclofen 5 MG TABS TAKE 1 TABLET AT BEDTIME (Patient taking differently: Take 5 mg by mouth at bedtime. ) 90 tablet 3  . diclofenac Sodium (VOLTAREN) 1 % GEL Apply 2 g topically 3 (three) times daily as needed.  (Patient not taking: Reported on 03/01/2020)    . ELIQUIS 5 MG TABS tablet TAKE 1 TABLET TWICE A DAY (Patient taking differently: Take 5 mg by mouth 2 (two) times daily. ) 180 tablet 1  . furosemide (LASIX) 20 MG tablet TAKE 2 TABLETS EVERY MORNING (Patient taking differently: Take 40 mg by mouth daily. ) 180 tablet 2  . levothyroxine (SYNTHROID) 25 MCG tablet TAKE 1 TABLET DAILY BEFORE BREAKFAST (Patient taking differently: Take 25 mcg by mouth daily before breakfast. ) 90 tablet 3  . omeprazole (PRILOSEC) 20 MG capsule TAKE 1 CAPSULE TWICE A DAY BEFORE MEALS (Patient taking differently: Take 20 mg by mouth 2 (two) times daily before a meal. ) 180 capsule 3  . temazepam (RESTORIL) 30 MG capsule TAKE 1 CAPSULE BY MOUTH AT BEDTIME AS NEEDED SLEEP (Patient taking differently: Take 30 mg by mouth at bedtime. ) 30 capsule 2   No current facility-administered medications for this visit.    Allergies   Allergen Reactions  . Morphine And Related Itching  . Percocet [Oxycodone-Acetaminophen] Other (See Comments)    Hallucinations       Review of Systems negative except from HPI and PMH  Physical Exam BP (!) 154/90 (BP Location: Left Arm, Patient Position: Sitting, Cuff Size: Normal)   Pulse 88   Ht 4\' 11"  (1.499 m)   Wt 164 lb 2  oz (74.4 kg)   SpO2 99%   BMI 33.15 kg/m  Well developed and well nourished in no acute distress HENT normal Neck supple   Clear Device pocket well healed; without hematoma or erythema.  There is no tethering  Regular rate and rhythm, 2/6 murmur Abd-soft with active BS No Clubbing cyanosis 2+ edema Skin-warm and dry A & Oriented  Grossly normal sensory and motor function  ECG AV pacing with an upright QRS lead I and a QR in lead V1  Assessment and  Plan  Atrial fibrillation/flutter long-term persistent  Ventricular tachycardia-nonsustained  Hypertension   Pacemaker  Medtronic   DM-nephropathy  Complete heart block  PVCs  Sleep disordered breathing and daytime somnolence    Tachycardia regular rhythm and much improved.  Nephrology is hoping that with restored AV synchrony her renal function will improve.  Will defer follow-up to them  In addition, I am reluctant to adjust her diuretics given her renal issues and will defer that also to them.  Thankfully she is holding sinus rhythm.  Blood pressure is elevated.  We will renew her amlodipine.

## 2020-04-04 NOTE — Telephone Encounter (Signed)
Medication Refill - Medication:  Baclofen 5 MG TABS   Has the patient contacted their pharmacy? yes (Agent: If no, request that the patient contact the pharmacy for the refill.) (Agent: If yes, when and what did the pharmacy advise?)Contact PCP  Preferred Pharmacy (with phone number or street name):  [place], [place] Phone:  [phone]  Fax:  [phone]       Agent: Please be advised that RX refills may take up to 3 business days. We ask that you follow-up with your pharmacy.

## 2020-04-04 NOTE — Patient Instructions (Signed)

## 2020-04-11 DIAGNOSIS — N178 Other acute kidney failure: Secondary | ICD-10-CM | POA: Diagnosis not present

## 2020-04-11 DIAGNOSIS — N184 Chronic kidney disease, stage 4 (severe): Secondary | ICD-10-CM | POA: Diagnosis not present

## 2020-04-15 ENCOUNTER — Other Ambulatory Visit: Payer: Self-pay | Admitting: Family Medicine

## 2020-04-15 DIAGNOSIS — M1A39X0 Chronic gout due to renal impairment, multiple sites, without tophus (tophi): Secondary | ICD-10-CM

## 2020-04-15 NOTE — Telephone Encounter (Signed)
Requested  medications are  due for refill today yes  Requested medications are on the active medication list yes  Last refill 6/13  Last visit 12/2019 (Did not see this med/dx addressed)  Future visit scheduled no  Notes to clinic There are duplicates of this Rx, please assess.

## 2020-04-17 ENCOUNTER — Telehealth: Payer: Self-pay | Admitting: Internal Medicine

## 2020-04-17 NOTE — Telephone Encounter (Signed)
Patient calling in stating she received what looks like a phone hook up and is calling in to state she does not have an active phone line. Patient uses a xbox for wifi. Patient unsure what to do with the cord now since she does not have an active line. This is regarding the patient pace maker

## 2020-04-17 NOTE — Telephone Encounter (Signed)
The pt called because she got a new cord to change out that connects to the wire x adapter. She is going to change it out.

## 2020-04-19 ENCOUNTER — Other Ambulatory Visit: Payer: Self-pay | Admitting: Family Medicine

## 2020-04-19 ENCOUNTER — Other Ambulatory Visit: Payer: Self-pay | Admitting: Internal Medicine

## 2020-04-19 DIAGNOSIS — R252 Cramp and spasm: Secondary | ICD-10-CM

## 2020-04-19 DIAGNOSIS — I10 Essential (primary) hypertension: Secondary | ICD-10-CM

## 2020-04-19 MED ORDER — AMLODIPINE BESYLATE 10 MG PO TABS: 10.0000 mg | ORAL_TABLET | Freq: Every day | ORAL | 0 refills | Status: DC

## 2020-04-19 NOTE — Telephone Encounter (Signed)
*  STAT* If patient is at the pharmacy, call can be transferred to refill team.   1. Which medications need to be refilled? (please list name of each medication and dose if known) amlodipine 10 mg  2. Which pharmacy/location (including street and city if local pharmacy) [facility]  3. Do they need a 30 day or 90 day supply? [place]

## 2020-04-19 NOTE — Telephone Encounter (Signed)
Requested Prescriptions   Signed Prescriptions Disp Refills   amLODipine (NORVASC) 10 MG tablet 90 tablet 0    Sig: Take 1 tablet (10 mg total) by mouth daily.    Authorizing Provider: Deboraha Sprang    Ordering User: Raelene Bott, Breena Bevacqua L

## 2020-04-30 ENCOUNTER — Other Ambulatory Visit: Payer: Self-pay | Admitting: Internal Medicine

## 2020-05-01 NOTE — Telephone Encounter (Signed)
Pt's age 69, wt 74.4 kg, SCr 2.37, CrCl 25.57, last ov w/ SK 04/04/20.

## 2020-05-16 ENCOUNTER — Other Ambulatory Visit: Payer: Self-pay | Admitting: Family Medicine

## 2020-05-16 DIAGNOSIS — F5101 Primary insomnia: Secondary | ICD-10-CM

## 2020-05-16 NOTE — Telephone Encounter (Signed)
Requested medication (s) are due for refill today - yes  Requested medication (s) are on the active medication list -yes  Future visit scheduled -no  Last refill: 04/17/20  Notes to clinic: Request for non delegated Rx  Requested Prescriptions  Pending Prescriptions Disp Refills   temazepam (RESTORIL) 30 MG capsule [Pharmacy Med Name: TEMAZEPAM 30 MG CAP] 30 capsule     Sig: TAKE 1 CAPSULE BY MOUTH AT BEDTIME AS NEEDED SLEEP      Not Delegated - Psychiatry:  Anxiolytics/Hypnotics Failed - 05/16/2020 10:52 AM      Failed - This refill cannot be delegated      Failed - Urine Drug Screen completed in last 360 days.      Passed - Valid encounter within last 6 months    Recent Outpatient Visits           5 months ago Primary osteoarthritis involving multiple joints   [facility], Devonne Doughty, DO   9 months ago Acute respiratory failure with hypoxia [facility])   [facility] Olin Hauser, DO   1 year ago Type 2 diabetes with nephropathy [facility])   [place], DO   1 year ago Tinnitus of left ear   [facility] Olin Hauser, DO   1 year ago Type 2 diabetes with nephropathy [facility])   [facility] Olin Hauser, DO                  Requested Prescriptions  Pending Prescriptions Disp Refills   temazepam (RESTORIL) 30 MG capsule [Pharmacy Med Name: TEMAZEPAM 30 MG CAP] 30 capsule     Sig: TAKE 1 CAPSULE BY MOUTH AT BEDTIME AS NEEDED SLEEP      Not Delegated - Psychiatry:  Anxiolytics/Hypnotics Failed - 05/16/2020 10:52 AM      Failed - This refill cannot be delegated      Failed - Urine Drug Screen completed in last 360 days.      Passed - Valid encounter within last 6 months    Recent Outpatient Visits           5 months ago Primary osteoarthritis involving multiple joints   [place],  DO   9 months ago Acute respiratory failure with hypoxia [facility])   [place], DO   1 year ago Type 2 diabetes with nephropathy [facility])   [place], DO   1 year ago Tinnitus of left ear   [place], DO   1 year ago Type 2 diabetes with nephropathy [facility])   [facility], Devonne Doughty, DO

## 2020-05-18 ENCOUNTER — Ambulatory Visit (INDEPENDENT_AMBULATORY_CARE_PROVIDER_SITE_OTHER): Payer: Medicare Other

## 2020-05-18 DIAGNOSIS — I442 Atrioventricular block, complete: Secondary | ICD-10-CM | POA: Diagnosis not present

## 2020-05-20 LAB — CUP PACEART REMOTE DEVICE CHECK
Battery Remaining Longevity: 20 mo
Battery Voltage: 2.92 V
Brady Statistic AP VP Percent: 29.25 %
Brady Statistic AP VS Percent: 0 %
Brady Statistic AS VP Percent: 70.74 %
Brady Statistic AS VS Percent: 0 %
Brady Statistic RA Percent Paced: 28.69 %
Brady Statistic RV Percent Paced: 99.99 %
Date Time Interrogation Session: 20211014110343
Implantable Lead Implant Date: 20141217
Implantable Lead Implant Date: 20141217
Implantable Lead Location: 753859
Implantable Lead Location: 753860
Implantable Lead Model: 5076
Implantable Lead Model: 5076
Implantable Pulse Generator Implant Date: 20141217
Lead Channel Impedance Value: 380 Ohm
Lead Channel Impedance Value: 475 Ohm
Lead Channel Impedance Value: 513 Ohm
Lead Channel Impedance Value: 513 Ohm
Lead Channel Pacing Threshold Amplitude: 0.375 V
Lead Channel Pacing Threshold Amplitude: 0.625 V
Lead Channel Pacing Threshold Pulse Width: 0.4 ms
Lead Channel Pacing Threshold Pulse Width: 0.4 ms
Lead Channel Sensing Intrinsic Amplitude: 16.875 mV
Lead Channel Sensing Intrinsic Amplitude: 16.875 mV
Lead Channel Sensing Intrinsic Amplitude: 2.875 mV
Lead Channel Sensing Intrinsic Amplitude: 2.875 mV
Lead Channel Setting Pacing Amplitude: 1.75 V
Lead Channel Setting Pacing Amplitude: 2.5 V
Lead Channel Setting Pacing Pulse Width: 0.4 ms
Lead Channel Setting Sensing Sensitivity: 0.9 mV

## 2020-05-23 NOTE — Progress Notes (Signed)
Remote pacemaker transmission.   

## 2020-06-05 ENCOUNTER — Other Ambulatory Visit: Payer: Self-pay | Admitting: Internal Medicine

## 2020-06-06 DIAGNOSIS — E1159 Type 2 diabetes mellitus with other circulatory complications: Secondary | ICD-10-CM | POA: Diagnosis not present

## 2020-06-06 LAB — HEMOGLOBIN A1C: Hemoglobin A1C: 7.4

## 2020-06-13 DIAGNOSIS — E1122 Type 2 diabetes mellitus with diabetic chronic kidney disease: Secondary | ICD-10-CM | POA: Diagnosis not present

## 2020-06-13 DIAGNOSIS — I10 Essential (primary) hypertension: Secondary | ICD-10-CM | POA: Diagnosis not present

## 2020-06-13 DIAGNOSIS — E782 Mixed hyperlipidemia: Secondary | ICD-10-CM | POA: Diagnosis not present

## 2020-06-13 DIAGNOSIS — N184 Chronic kidney disease, stage 4 (severe): Secondary | ICD-10-CM | POA: Diagnosis not present

## 2020-06-13 DIAGNOSIS — E1159 Type 2 diabetes mellitus with other circulatory complications: Secondary | ICD-10-CM | POA: Diagnosis not present

## 2020-06-15 DIAGNOSIS — N2581 Secondary hyperparathyroidism of renal origin: Secondary | ICD-10-CM | POA: Diagnosis not present

## 2020-06-15 DIAGNOSIS — I129 Hypertensive chronic kidney disease with stage 1 through stage 4 chronic kidney disease, or unspecified chronic kidney disease: Secondary | ICD-10-CM | POA: Diagnosis not present

## 2020-06-15 DIAGNOSIS — E1122 Type 2 diabetes mellitus with diabetic chronic kidney disease: Secondary | ICD-10-CM | POA: Diagnosis not present

## 2020-06-15 DIAGNOSIS — R809 Proteinuria, unspecified: Secondary | ICD-10-CM | POA: Diagnosis not present

## 2020-06-15 DIAGNOSIS — N184 Chronic kidney disease, stage 4 (severe): Secondary | ICD-10-CM | POA: Diagnosis not present

## 2020-06-15 DIAGNOSIS — E875 Hyperkalemia: Secondary | ICD-10-CM | POA: Diagnosis not present

## 2020-06-22 DIAGNOSIS — E872 Acidosis, unspecified: Secondary | ICD-10-CM | POA: Insufficient documentation

## 2020-06-22 DIAGNOSIS — E1122 Type 2 diabetes mellitus with diabetic chronic kidney disease: Secondary | ICD-10-CM | POA: Diagnosis not present

## 2020-06-22 DIAGNOSIS — N184 Chronic kidney disease, stage 4 (severe): Secondary | ICD-10-CM | POA: Diagnosis not present

## 2020-06-22 DIAGNOSIS — R809 Proteinuria, unspecified: Secondary | ICD-10-CM | POA: Diagnosis not present

## 2020-06-22 DIAGNOSIS — N2581 Secondary hyperparathyroidism of renal origin: Secondary | ICD-10-CM | POA: Diagnosis not present

## 2020-06-22 DIAGNOSIS — E875 Hyperkalemia: Secondary | ICD-10-CM | POA: Diagnosis not present

## 2020-06-22 DIAGNOSIS — I129 Hypertensive chronic kidney disease with stage 1 through stage 4 chronic kidney disease, or unspecified chronic kidney disease: Secondary | ICD-10-CM | POA: Diagnosis not present

## 2020-06-27 ENCOUNTER — Ambulatory Visit (INDEPENDENT_AMBULATORY_CARE_PROVIDER_SITE_OTHER): Payer: Medicare Other | Admitting: Family Medicine

## 2020-06-27 ENCOUNTER — Other Ambulatory Visit: Payer: Self-pay

## 2020-06-27 ENCOUNTER — Encounter: Payer: Self-pay | Admitting: Family Medicine

## 2020-06-27 VITALS — BP 120/61 | HR 66 | Temp 96.9°F | Resp 16 | Ht 59.0 in | Wt 167.0 lb

## 2020-06-27 DIAGNOSIS — I4819 Other persistent atrial fibrillation: Secondary | ICD-10-CM | POA: Diagnosis not present

## 2020-06-27 DIAGNOSIS — I442 Atrioventricular block, complete: Secondary | ICD-10-CM

## 2020-06-27 DIAGNOSIS — N184 Chronic kidney disease, stage 4 (severe): Secondary | ICD-10-CM

## 2020-06-27 DIAGNOSIS — E785 Hyperlipidemia, unspecified: Secondary | ICD-10-CM

## 2020-06-27 DIAGNOSIS — I129 Hypertensive chronic kidney disease with stage 1 through stage 4 chronic kidney disease, or unspecified chronic kidney disease: Secondary | ICD-10-CM

## 2020-06-27 DIAGNOSIS — E1169 Type 2 diabetes mellitus with other specified complication: Secondary | ICD-10-CM

## 2020-06-27 DIAGNOSIS — E039 Hypothyroidism, unspecified: Secondary | ICD-10-CM

## 2020-06-27 NOTE — Patient Instructions (Addendum)
Thank you for coming to the office today.  Keep on current meds, no changes. Keep up the good work.  Recent Labs    12/09/19 0000 02/24/20 1402 06/06/20 0000  HGBA1C 7.9 7.6* 7.4   Will need a Thyroid lab test before next visit. Check with Dr Gabriel Carina if drawing blood or Cardiology   Please schedule a Follow-up Appointment to: Return in about 6 months (around 12/25/2020) for 6 month follow-up DM, HTN CKD, Thyroid.  If you have any other questions or concerns, please feel free to call the office or send a message through [place]. You may also schedule an earlier appointment if necessary.  Additionally, you may be receiving a survey about your experience at our office within a few days to 1 week by e-mail or mail. We value your feedback.  [name], DO [place]

## 2020-06-27 NOTE — Progress Notes (Signed)
Subjective:    Patient ID: Renee Boyd, female    DOB: 17-Apr-1951, 69 y.o.   MRN: [id number]  Renee Boyd is a 69 y.o. female presenting on 06/27/2020 for Hypertension   HPI  Specialists: Endocrinology - Malissa Hippo NP [facility]) Nephrology - Dr Lavonia Dana [facility]) Cardiology - [facility] [place] / EP  Here for 6 month follow-up  History of COVID19 Pneumonia / Cough - resolved. When singing high notes she triggers cough. Uses inhaler PRN.  Persistent Atrial Fibrillation, s/p Cardioversion On Anticoagulation with Eliquis  CKD-IV with hyperkalemia and subnephrotic proteinuria HTN Secondary Anemia of chronic kidney disease -  Last Hemoglobin stable 11. Secondary to DM HTN Followed by Dr Juleen China, last seen 11/18 On Lisinopril for renal protection On Furosemide, ACEi, Metoprolol, Amlodipine Off sodium bicarbonate  Type 2 Diabetes Followed by [facility] Endocrinology currently due to advancing renal disease. Remains discontinued on Metformin due to renal function. She should avoid NSAIDs. But continue Lisinopril ACEi. Last lab A1c 7.4 (06/06/20) She uses Contour Next EZ Glucomter On Trulicity 1.5 mg weekly Off Rybelsus due to nausea  Osteoarthritis, multiple joints / Back Pain / Hip Pain / Shoulder pain Chronic problem. Episodic painful flares of joint pain, worse with inc activity and storms and weather changes Previously managed on Tramadol PRN for pain and this was renally adjusted to max of 50mg  BID but then eventually taken off of this  Cannot take NSAIDs due to CKD. On Gabapentin 300mg  daily doing well. Using baclofen 5mg  only max dose nightly   Health Maintenance: UTD COVID19 vaccine. - booster vaccine 07/11/20 upcoming.  Due for Flu Shot, declines today despite counseling on benefits   Depression screen [facility] 2/9 06/27/2020 03/14/2020 12/14/2019  Decreased Interest 0 0 0  Down, Depressed, Hopeless 0 0 0  PHQ - 2 Score 0 0 0  Altered sleeping  - - -  Tired, decreased energy - - -  Change in appetite - - -  Feeling bad or failure about yourself  - - -  Trouble concentrating - - -  Moving slowly or fidgety/restless - - -  Suicidal thoughts - - -  PHQ-9 Score - - -  Difficult doing work/chores - - -    Social History   Tobacco Use  . Smoking status: Former Smoker    Packs/day: 2.00    Years: 40.00    Pack years: 80.00    Types: Cigarettes    Quit date: 06/27/2005    Years since quitting: 15.0  . Smokeless tobacco: Former Systems developer  . Tobacco comment: quit Jun 27 2005  Vaping Use  . Vaping Use: Never used  Substance Use Topics  . Alcohol use: No  . Drug use: No    Review of Systems Per HPI unless specifically indicated above     Objective:    BP 120/61   Pulse 66   Temp (!) 96.9 F (36.1 C) (Temporal)   Resp 16   Ht 4\' 11"  (1.499 m)   Wt 167 lb (75.8 kg)   SpO2 97%   BMI 33.73 kg/m   Wt Readings from Last 3 Encounters:  06/27/20 167 lb (75.8 kg)  04/04/20 164 lb 2 oz (74.4 kg)  03/14/20 161 lb (73 kg)    Physical Exam Vitals and nursing note reviewed.  Constitutional:      General: She is not in acute distress.    Appearance: She is well-developed. She is obese. She is not diaphoretic.  Comments: Well-appearing, comfortable, cooperative  HENT:     Head: Normocephalic and atraumatic.  Eyes:     General:        Right eye: No discharge.        Left eye: No discharge.     Conjunctiva/sclera: Conjunctivae normal.  Neck:     Thyroid: No thyromegaly.  Cardiovascular:     Rate and Rhythm: Normal rate and regular rhythm.     Heart sounds: Normal heart sounds. No murmur heard.   Pulmonary:     Effort: Pulmonary effort is normal. No respiratory distress.     Breath sounds: Normal breath sounds. No wheezing or rales.  Musculoskeletal:        General: Normal range of motion.     Cervical back: Normal range of motion and neck supple.     Right lower leg: No edema.     Left lower leg: No edema.    Lymphadenopathy:     Cervical: No cervical adenopathy.  Skin:    General: Skin is warm and dry.     Findings: No erythema or rash.  Neurological:     Mental Status: She is alert and oriented to person, place, and time.  Psychiatric:        Behavior: Behavior normal.     Comments: Well groomed, good eye contact, normal speech and thoughts         Recent Labs    12/09/19 0000 02/24/20 1402 06/06/20 0000  HGBA1C 7.9 7.6* 7.4   Diabetic Foot Exam - Simple   Simple Foot Form Diabetic Foot exam was performed with the following findings: Yes 06/27/2020 11:31 AM  Visual Inspection No deformities, no ulcerations, no other skin breakdown bilaterally: Yes Sensation Testing Intact to touch and monofilament testing bilaterally: Yes Pulse Check Posterior Tibialis and Dorsalis pulse intact bilaterally: Yes Comments      Results for orders placed or performed in visit on 06/27/20  Hemoglobin A1c  Result Value Ref Range   Hemoglobin A1C 7.4       Assessment & Plan:   Problem List Items Addressed This Visit    Type 2 diabetes mellitus with hyperlipidemia ([place])    Followed by [facility] Endocrinology currently  Improved A1c to 7.4 Within range of 7-8 No hypoglycemia Complications - CKD-IV, other including, GERD, obesity, hypothyroidism, Gout - increases risk of future cardiovascular complications  - OFF Sulfonylurea, Metformin due to CKD, Rybelsus nausea  Followed by Dr Juleen China [facility] - cannot resume Metformin due to GFR  Plan Continue with current plan per Endocrine - Trulicity 1.5mg  weekly. 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 Continue current BP regimen - Metoprolol-XL 100mg  daily, Amlodipine 10mg , Lisinopril  20mg  daily - Furosemide 40mg  daily (x2 of 20mg  tabs) per Cardiology 2. Encourage improved lifestyle - low sodium diet, regular exercise 3. Continue monitor BP outside office, bring readings to next visit, if persistently >140/90 or new symptoms notify office sooner          No orders of the defined types were placed in this encounter.     Follow up plan: Return in about 6 months (around 12/25/2020) for 6 month follow-up DM, HTN CKD, Thyroid.   [name], [facility] 06/27/2020, 11:23 AM

## 2020-06-27 NOTE — Assessment & Plan Note (Signed)
Stable chronic problem s/p pacemaker Followed by Cardiology

## 2020-06-27 NOTE — Assessment & Plan Note (Addendum)
Controlled Last TSH normal On levothyroxine 65mcg daily - continue

## 2020-06-27 NOTE — Assessment & Plan Note (Signed)
Stable CKD-IV per Dr Juleen China [facility] Reviewed report Secondary to DM, HTN, Age Cannot use Metformin due to GFR - on GLP1 Trulicity per Endocrine

## 2020-06-27 NOTE — Assessment & Plan Note (Signed)
Controlled HTN Complication with CKD-IV, CHB pacer, AFib Followed by Cardiology, Nephrology    Plan:  1. Continue current BP regimen - Metoprolol-XL 100mg  daily, Amlodipine 10mg , Lisinopril 20mg  daily - Furosemide 40mg  daily (x2 of 20mg  tabs) per Cardiology 2. Encourage improved lifestyle - low sodium diet, regular exercise 3. Continue monitor BP outside office, bring readings to next visit, if persistently >140/90 or new symptoms notify office sooner

## 2020-06-27 NOTE — Assessment & Plan Note (Signed)
Followed by [facility] Endocrinology currently  Improved A1c to 7.4 Within range of 7-8 No hypoglycemia Complications - CKD-IV, other including, GERD, obesity, hypothyroidism, Gout - increases risk of future cardiovascular complications  - OFF Sulfonylurea, Metformin due to CKD, Rybelsus nausea  Followed by Dr Juleen China [facility] - cannot resume Metformin due to GFR  Plan Continue with current plan per Endocrine - Trulicity 1.5mg  weekly. Lifestyle recommendations

## 2020-06-27 NOTE — Assessment & Plan Note (Signed)
S/p Cardioversion On anticoagulation Eliquis

## 2020-07-03 ENCOUNTER — Other Ambulatory Visit: Payer: Self-pay | Admitting: Internal Medicine

## 2020-07-03 DIAGNOSIS — I10 Essential (primary) hypertension: Secondary | ICD-10-CM

## 2020-07-11 ENCOUNTER — Ambulatory Visit: Payer: Medicare Other | Attending: Internal Medicine

## 2020-07-11 ENCOUNTER — Other Ambulatory Visit: Payer: Self-pay | Admitting: Internal Medicine

## 2020-07-11 DIAGNOSIS — Z23 Encounter for immunization: Secondary | ICD-10-CM

## 2020-07-11 NOTE — Progress Notes (Signed)
   Covid-19 Vaccination Clinic  Name:  Renee Boyd    MRN: [id number] DOB: 11/04/1950  07/11/2020  [name] was observed post Covid-19 immunization for 15 minutes without incident. She was provided with Vaccine Information Sheet and instruction to access the V-Safe system.   [name] was instructed to call 911 with any severe reactions post vaccine: Marland Kitchen Difficulty breathing  . Swelling of face and throat  . A fast heartbeat  . A bad rash all over body  . Dizziness and weakness   Immunizations Administered    Name Date Dose VIS Date Route   Pfizer COVID-19 Vaccine 07/11/2020 10:02 AM 0.3 mL 05/24/2020 Intramuscular   Manufacturer: [employer]   Lot: [id number]   [place]: [id number]

## 2020-07-12 ENCOUNTER — Other Ambulatory Visit: Payer: Self-pay | Admitting: Internal Medicine

## 2020-07-12 NOTE — Telephone Encounter (Signed)
Pt's age 69, wt 75.8 kg, SCr 2.37, CrCl 26.05, last ov w/ SK 04/04/20.

## 2020-07-12 NOTE — Telephone Encounter (Signed)
Please review for refill. Thanks!  

## 2020-07-13 ENCOUNTER — Other Ambulatory Visit: Payer: Self-pay | Admitting: Family Medicine

## 2020-07-13 DIAGNOSIS — R0609 Other forms of dyspnea: Secondary | ICD-10-CM

## 2020-07-20 DIAGNOSIS — E1122 Type 2 diabetes mellitus with diabetic chronic kidney disease: Secondary | ICD-10-CM | POA: Diagnosis not present

## 2020-07-20 DIAGNOSIS — I129 Hypertensive chronic kidney disease with stage 1 through stage 4 chronic kidney disease, or unspecified chronic kidney disease: Secondary | ICD-10-CM | POA: Diagnosis not present

## 2020-07-20 DIAGNOSIS — E872 Acidosis: Secondary | ICD-10-CM | POA: Diagnosis not present

## 2020-07-20 DIAGNOSIS — E875 Hyperkalemia: Secondary | ICD-10-CM | POA: Diagnosis not present

## 2020-07-20 DIAGNOSIS — R809 Proteinuria, unspecified: Secondary | ICD-10-CM | POA: Diagnosis not present

## 2020-07-20 DIAGNOSIS — N184 Chronic kidney disease, stage 4 (severe): Secondary | ICD-10-CM | POA: Diagnosis not present

## 2020-07-20 DIAGNOSIS — N2581 Secondary hyperparathyroidism of renal origin: Secondary | ICD-10-CM | POA: Diagnosis not present

## 2020-07-25 DIAGNOSIS — H40003 Preglaucoma, unspecified, bilateral: Secondary | ICD-10-CM | POA: Diagnosis not present

## 2020-08-03 DIAGNOSIS — E875 Hyperkalemia: Secondary | ICD-10-CM | POA: Diagnosis not present

## 2020-08-03 DIAGNOSIS — I129 Hypertensive chronic kidney disease with stage 1 through stage 4 chronic kidney disease, or unspecified chronic kidney disease: Secondary | ICD-10-CM | POA: Diagnosis not present

## 2020-08-03 DIAGNOSIS — R809 Proteinuria, unspecified: Secondary | ICD-10-CM | POA: Diagnosis not present

## 2020-08-03 DIAGNOSIS — E1122 Type 2 diabetes mellitus with diabetic chronic kidney disease: Secondary | ICD-10-CM | POA: Diagnosis not present

## 2020-08-03 DIAGNOSIS — N2581 Secondary hyperparathyroidism of renal origin: Secondary | ICD-10-CM | POA: Diagnosis not present

## 2020-08-03 DIAGNOSIS — E872 Acidosis: Secondary | ICD-10-CM | POA: Diagnosis not present

## 2020-08-03 DIAGNOSIS — N184 Chronic kidney disease, stage 4 (severe): Secondary | ICD-10-CM | POA: Diagnosis not present

## 2020-08-14 ENCOUNTER — Other Ambulatory Visit: Payer: Self-pay | Admitting: Family Medicine

## 2020-08-14 DIAGNOSIS — K219 Gastro-esophageal reflux disease without esophagitis: Secondary | ICD-10-CM

## 2020-08-14 DIAGNOSIS — F5101 Primary insomnia: Secondary | ICD-10-CM

## 2020-08-14 NOTE — Telephone Encounter (Signed)
Requested medication (s) are due for refill today: no  Requested medication (s) are on the active medication list: yes   Last refill: 07/13/2020  Future visit scheduled: yes  Notes to clinic:  this refill cannot be delegated    Requested Prescriptions  Pending Prescriptions Disp Refills   temazepam (RESTORIL) 30 MG capsule [Pharmacy Med Name: TEMAZEPAM 30 MG CAP] 30 capsule     Sig: TAKE 1 CAPSULE BY MOUTH AT BEDTIME AS NEEDED SLEEP      Not Delegated - Psychiatry:  Anxiolytics/Hypnotics Failed - 08/14/2020 11:01 AM      Failed - This refill cannot be delegated      Failed - Urine Drug Screen completed in last 360 days      Passed - Valid encounter within last 6 months    Recent Outpatient Visits           1 month ago Benign hypertension with CKD (chronic kidney disease) stage IV [facility])   [place], DO   8 months ago Primary osteoarthritis involving multiple joints   [place], DO   12 months ago Acute respiratory failure with hypoxia [facility])   [facility] Olin Hauser, DO   1 year ago Type 2 diabetes with nephropathy [facility])   [place], DO   1 year ago Tinnitus of left ear   [place], DO       Future Appointments             In 4 months [place], Devonne Doughty, DO [facility], [facility]

## 2020-08-30 DIAGNOSIS — N184 Chronic kidney disease, stage 4 (severe): Secondary | ICD-10-CM | POA: Diagnosis not present

## 2020-08-31 ENCOUNTER — Ambulatory Visit (INDEPENDENT_AMBULATORY_CARE_PROVIDER_SITE_OTHER): Payer: Medicare Other

## 2020-08-31 DIAGNOSIS — I442 Atrioventricular block, complete: Secondary | ICD-10-CM

## 2020-08-31 LAB — CUP PACEART REMOTE DEVICE CHECK
Battery Remaining Longevity: 16 mo
Battery Voltage: 2.91 V
Brady Statistic RA Percent Paced: 0.07 %
Brady Statistic RV Percent Paced: 99.99 %
Date Time Interrogation Session: 20220127121211
Implantable Lead Implant Date: 20141217
Implantable Lead Implant Date: 20141217
Implantable Lead Location: 753859
Implantable Lead Location: 753860
Implantable Lead Model: 5076
Implantable Lead Model: 5076
Implantable Pulse Generator Implant Date: 20141217
Lead Channel Impedance Value: 399 Ohm
Lead Channel Impedance Value: 456 Ohm
Lead Channel Impedance Value: 494 Ohm
Lead Channel Impedance Value: 532 Ohm
Lead Channel Pacing Threshold Amplitude: 0.375 V
Lead Channel Pacing Threshold Amplitude: 0.75 V
Lead Channel Pacing Threshold Pulse Width: 0.4 ms
Lead Channel Pacing Threshold Pulse Width: 0.4 ms
Lead Channel Sensing Intrinsic Amplitude: 16.875 mV
Lead Channel Sensing Intrinsic Amplitude: 16.875 mV
Lead Channel Sensing Intrinsic Amplitude: 4.375 mV
Lead Channel Sensing Intrinsic Amplitude: 4.375 mV
Lead Channel Setting Pacing Amplitude: 1.75 V
Lead Channel Setting Pacing Amplitude: 2.5 V
Lead Channel Setting Pacing Pulse Width: 0.4 ms
Lead Channel Setting Sensing Sensitivity: 0.9 mV

## 2020-09-10 NOTE — Progress Notes (Signed)
Remote pacemaker transmission.   

## 2020-09-11 ENCOUNTER — Other Ambulatory Visit: Payer: Self-pay

## 2020-09-11 DIAGNOSIS — M5442 Lumbago with sciatica, left side: Secondary | ICD-10-CM

## 2020-09-11 DIAGNOSIS — G8929 Other chronic pain: Secondary | ICD-10-CM

## 2020-09-11 MED ORDER — DICLOFENAC SODIUM 1 % EX GEL: 2.0000 g | Freq: Three times a day (TID) | CUTANEOUS | 3 refills | Status: DC | PRN

## 2020-09-14 ENCOUNTER — Telehealth: Payer: Self-pay | Admitting: Family Medicine

## 2020-09-14 NOTE — Telephone Encounter (Signed)
Yes. That is fine to use the generic Diclofenac Sodium 1% gel. Please notify pharmacy they can fill it  [name], [facility] 09/14/2020, 4:40 PM

## 2020-09-14 NOTE — Telephone Encounter (Signed)
Called to inform the doctor that the Voltarin Gel has been discontinued and the one that they have as an alternative is diclofenac Sodium (VOLTAREN) 1 % GEL.  Pharmacist would like to know if this should be filled.  Please call to confirm at [phone], Ref# [id number]

## 2020-09-15 ENCOUNTER — Other Ambulatory Visit: Payer: Self-pay

## 2020-09-15 DIAGNOSIS — L209 Atopic dermatitis, unspecified: Secondary | ICD-10-CM

## 2020-09-15 MED ORDER — DESONIDE 0.05 % EX LOTN: TOPICAL_LOTION | Freq: Two times a day (BID) | CUTANEOUS | 0 refills | Status: DC

## 2020-09-15 NOTE — Telephone Encounter (Signed)
I left a detail message on [facility] voicemail with my direct ext if any questions or concerns.

## 2020-09-15 NOTE — Telephone Encounter (Signed)
The pt called requesting a refill on Desonide lotion 0.05%. She said she have been eating oranges and now she have developed a rash around her mouth and chin from the acid. She state she has had this to happen in the past and it was treated with Desonide. Please advise

## 2020-09-18 ENCOUNTER — Telehealth: Payer: Self-pay | Admitting: Internal Medicine

## 2020-09-18 DIAGNOSIS — I4819 Other persistent atrial fibrillation: Secondary | ICD-10-CM

## 2020-09-18 NOTE — Telephone Encounter (Signed)
Renee Sprang, MD  09/16/2020 2:46 PM EST      Remote reviewed. This remote is abnormal for persistence of atrial flutter  IN the past she has felt better, H in sinus-- her flutter is atypical by ECG in 7/21 and we dont have a new ECG fore the new flutter\can we get her to come in for an ECG plz Thanks SK

## 2020-09-18 NOTE — Telephone Encounter (Signed)
Noted  

## 2020-09-18 NOTE — Telephone Encounter (Signed)
Patient calling back.  She got disconnected but wants Nira Conn to know she heard instructions and will be there at 1245

## 2020-09-18 NOTE — Telephone Encounter (Signed)
Attempted to call the patient. No answer & n/ voice mail set up.  Will call back at a later time.

## 2020-09-18 NOTE — Telephone Encounter (Signed)
The patient called me back as she has seen on her phone our office had tried to call her.  I have advised the patient that Dr. Caryl Comes reviewed her most recent transmission and of his comments and recommendations for a repeat EKG to be done.  The patient voices understanding.   She states that her kidneys "are failing, " and that she has a follow up appointment on Wednesday 09/20/20 at 2:30 pm with Dr. Juleen China to discuss recent testing.  I have advised I can try to schedule the EKG on 2/16 to be done prior to her Dr. Juleen China appointment and she is agreeable with this.   I advised I will call scheduling and call her back.  The patient voices understanding.  I have attempted to call scheduling and had to leave a message to please call back.

## 2020-09-19 ENCOUNTER — Other Ambulatory Visit: Payer: Self-pay | Admitting: Internal Medicine

## 2020-09-20 ENCOUNTER — Ambulatory Visit
Admission: RE | Admit: 2020-09-20 | Discharge: 2020-09-20 | Disposition: A | Discharge status: Home / Self Care | Payer: Medicare Other | Source: Ambulatory Visit | Attending: Internal Medicine | Admitting: Internal Medicine

## 2020-09-20 ENCOUNTER — Other Ambulatory Visit: Payer: Self-pay

## 2020-09-20 DIAGNOSIS — R9431 Abnormal electrocardiogram [ECG] [EKG]: Secondary | ICD-10-CM | POA: Diagnosis not present

## 2020-09-20 DIAGNOSIS — I4819 Other persistent atrial fibrillation: Secondary | ICD-10-CM | POA: Insufficient documentation

## 2020-09-20 NOTE — Telephone Encounter (Signed)
This is a [place] pt 

## 2020-10-03 ENCOUNTER — Encounter: Payer: Self-pay | Admitting: Internal Medicine

## 2020-10-03 ENCOUNTER — Ambulatory Visit (INDEPENDENT_AMBULATORY_CARE_PROVIDER_SITE_OTHER): Payer: Medicare Other | Admitting: Internal Medicine

## 2020-10-03 ENCOUNTER — Other Ambulatory Visit: Payer: Self-pay

## 2020-10-03 VITALS — BP 144/80 | HR 74 | Ht 59.0 in | Wt 168.0 lb

## 2020-10-03 DIAGNOSIS — I4819 Other persistent atrial fibrillation: Secondary | ICD-10-CM | POA: Diagnosis not present

## 2020-10-03 DIAGNOSIS — I442 Atrioventricular block, complete: Secondary | ICD-10-CM | POA: Diagnosis not present

## 2020-10-03 DIAGNOSIS — Z950 Presence of cardiac pacemaker: Secondary | ICD-10-CM | POA: Diagnosis not present

## 2020-10-03 DIAGNOSIS — I10 Essential (primary) hypertension: Secondary | ICD-10-CM

## 2020-10-03 DIAGNOSIS — I472 Ventricular tachycardia: Secondary | ICD-10-CM | POA: Diagnosis not present

## 2020-10-03 DIAGNOSIS — Z79899 Other long term (current) drug therapy: Secondary | ICD-10-CM

## 2020-10-03 DIAGNOSIS — I4729 Other ventricular tachycardia: Secondary | ICD-10-CM

## 2020-10-03 MED ORDER — AMIODARONE HCL 200 MG PO TABS: ORAL_TABLET | ORAL | Status: DC

## 2020-10-03 NOTE — Patient Instructions (Addendum)
Medication Instructions:  - Your physician has recommended you make the following change in your medication:   1) INCREASE amiodarone 200 mg- take 1 tablet by mouth TWICE daily   2) INCREASE lasix (furosemide) 20 mg- take 4 tablets (80 mg) by mouth once daily x 3 days, then resume taking 2 tablets (40 mg) once daily   *If you need a refill on your cardiac medications before your next appointment, please call your pharmacy*   Lab Work:  1) Pre procedure lab work: pending the date of your [place] Entrance at [facility] 1st desk on the right to check in, past the screening table Lab hours: Monday- Friday (7:30 am- 5:30 pm)  - Pre procedure COVID swab: Pending the date of your Cardioversion (8:00 am- 1:00 pm) [facility] Drive up test only- staff will come out to the car to swab you  If you have labs (blood work) drawn today and your tests are completely normal, you will receive your results only by: Marland Kitchen MyChart Message (if you have MyChart) OR . A paper copy in the mail If you have any lab test that is abnormal or we need to change your treatment, we will call you to review the results.   Testing/Procedures: - Your physician has recommended that you have a Cardioversion (DCCV)- in 3 weeks. Electrical Cardioversion uses a jolt of electricity to your heart either through paddles or wired patches attached to your chest. This is a controlled, usually prescheduled, procedure. Defibrillation is done under light anesthesia in the hospital, and you usually go home the day of the procedure. This is done to get your heart back into a normal rhythm. You are not awake for the procedure.   Nira Conn, RN for Dr. Caryl Comes will call you to set this up when you have your calendar in front of you   Follow-Up: At [facility], you and your health needs are our priority.  As part of our continuing mission to provide you with exceptional heart care, we have created designated Provider  Care Teams.  These Care Teams include your primary Cardiologist (physician) and Advanced Practice Providers (APPs -  Physician Assistants and Nurse Practitioners) who all work together to provide you with the care you need, when you need it.  We recommend signing up for the patient portal called "MyChart".  Sign up information is provided on this After Visit Summary.  MyChart is used to connect with patients for Virtual Visits (Telemedicine).  Patients are able to view lab/test results, encounter notes, upcoming appointments, etc.  Non-urgent messages can be sent to your provider as well.   To learn more about what you can do with MyChart, go to [email or web address].    Your next appointment:   6-8  week(s)  The format for your next appointment:   In Person  Provider:   Virl Axe, MD   Other Instructions   Electrical Cardioversion Electrical cardioversion is the delivery of a jolt of electricity to restore a normal rhythm to the heart. A rhythm that is too fast or is not regular keeps the heart from pumping well. In this procedure, sticky patches or metal paddles are placed on the chest to deliver electricity to the heart from a device. This procedure may be done in an emergency if:  There is low or no blood pressure as a result of the heart rhythm.  Normal rhythm must be restored as fast as possible to protect the brain and heart from  further damage.  It may save a life. This may also be a scheduled procedure for irregular or fast heart rhythms that are not immediately life-threatening. Tell a health care provider about:  Any allergies you have.  All medicines you are taking, including vitamins, herbs, eye drops, creams, and over-the-counter medicines.  Any problems you or family members have had with anesthetic medicines.  Any blood disorders you have.  Any surgeries you have had.  Any medical conditions you have.  Whether you are pregnant or may be pregnant. What  are the risks? Generally, this is a safe procedure. However, problems may occur, including:  Allergic reactions to medicines.  A blood clot that breaks free and travels to other parts of your body.  The possible return of an abnormal heart rhythm within hours or days after the procedure.  Your heart stopping (cardiac arrest). This is rare. What happens before the procedure? Medicines  Your health care provider may have you start taking: ? Blood-thinning medicines (anticoagulants) so your blood does not clot as easily. ? Medicines to help stabilize your heart rate and rhythm.  Ask your health care provider about: ? Changing or stopping your regular medicines. This is especially important if you are taking diabetes medicines or blood thinners. ? Taking medicines such as aspirin and ibuprofen. These medicines can thin your blood. Do not take these medicines unless your health care provider tells you to take them. ? Taking over-the-counter medicines, vitamins, herbs, and supplements. General instructions  Follow instructions from your health care provider about eating or drinking restrictions.  Plan to have someone take you home from the hospital or clinic.  If you will be going home right after the procedure, plan to have someone with you for 24 hours.  Ask your health care provider what steps will be taken to help prevent infection. These may include washing your skin with a germ-killing soap. What happens during the procedure?  An IV will be inserted into one of your veins.  Sticky patches (electrodes) or metal paddles may be placed on your chest.  You will be given a medicine to help you relax (sedative).  An electrical shock will be delivered. The procedure may vary among health care providers and hospitals.   What can I expect after the procedure?  Your blood pressure, heart rate, breathing rate, and blood oxygen level will be monitored until you leave the hospital or  clinic.  Your heart rhythm will be watched to make sure it does not change.  You may have some redness on the skin where the shocks were given. Follow these instructions at home:  Do not drive for 24 hours if you were given a sedative during your procedure.  Take over-the-counter and prescription medicines only as told by your health care provider.  Ask your health care provider how to check your pulse. Check it often.  Rest for 48 hours after the procedure or as told by your health care provider.  Avoid or limit your caffeine use as told by your health care provider.  Keep all follow-up visits as told by your health care provider. This is important. Contact a health care provider if:  You feel like your heart is beating too quickly or your pulse is not regular.  You have a serious muscle cramp that does not go away. Get help right away if:  You have discomfort in your chest.  You are dizzy or you feel faint.  You have trouble breathing or you  are short of breath.  Your speech is slurred.  You have trouble moving an arm or leg on one side of your body.  Your fingers or toes turn cold or blue. Summary  Electrical cardioversion is the delivery of a jolt of electricity to restore a normal rhythm to the heart.  This procedure may be done right away in an emergency or may be a scheduled procedure if the condition is not an emergency.  Generally, this is a safe procedure.  After the procedure, check your pulse often as told by your health care provider. This information is not intended to replace advice given to you by your health care provider. Make sure you discuss any questions you have with your health care provider. Document Revised: 02/22/2019 Document Reviewed: 02/22/2019 Elsevier Patient Education  [place].

## 2020-10-03 NOTE — H&P (View-Only) (Signed)
Patient Care Team: Olin Hauser, DO as PCP - General (Family Medicine) Lavonia Dana, MD as Consulting Physician (Nephrology) Deboraha Sprang, MD as Consulting Physician (Cardiology) Edrick Kins, DPM as Consulting Physician (Podiatry) Vanita Ingles, RN as Case Manager (General Practice)   HPI  Renee Boyd is a 70 y.o. female Seen in followup for largely asymptomatic persistent atrial fibrillation.  She transferred care to here 7/17.  At that time, anticoagulation was initiated w Apixoban    She is s/p pacemaker insertion Medtronic 2014 for sinus node dysfunction and CHB   Previously exposed to [place] when started [employer].   Underwent cardioversion 7/21 with significant improvement in symptoms.  She reverted back to atrial fib/flutter shortly after her last office visit.  Does not feel as bad as she did; still however   intermittent symptoms of weakness and  shortness of breath.  Some edema; blames it on her recent bag of sun chips  Followed closely by nephrology Records and Results Reviewed Date Cr K Hgb LFTs TSH  10/17 1.36  8.8  0.304  2/18 1.73 4.9   0.6  6/18  1.3  11.1    1/20 1.2 4.3 11.3    1/21 1.32 3.2 9.6 48   7/21 2.37 4.2 12.4 38 0.59  8/21 2.64 (CE)      12/21 2.64 (CE)        DATE TEST EF   7/17 Echo   60-65 % Pulm HTN 64 (no E/E')  12/19  Echo  50-55% LVH severe (17/16)   PAsys 50-55   MR mild LAE(37/2.0/34)  1/20 CTA  CAD 2V   FFR > 0.8 LAD/RCA    .   Thromboembolic risk factors ( age -66, HTN-1, TIA/CVA-2, DM-1 , Gender-1) for a CHADSVASc Score of 6     Past Medical History:  Diagnosis Date  . Complete heart block ([place])    a. s/p MDT PPM 2006 with device generator replacement 2014; b. followed by Dr. Caryl Comes  . Diabetes ([place])   . GERD (gastroesophageal reflux disease)   . Hypertension   . Hypothyroid   . Pacemaker   . Persistent atrial fibrillation (HCC)    a. on Eliquis; b. CHADS2VASc 6 (HTN. age x 1, DM,  TIA x 2, female); c. s/p DCCV 05/28/18  . Pulmonary hypertension ([place])    a. echo 2017: EF of 60-65%, no RWMA, mildly dilated left atrium, RVSF normal, PASP 64 mmHg  . TIA (transient ischemic attack)     Past Surgical History:  Procedure Laterality Date  . ABDOMINAL HYSTERECTOMY    . CARDIOVERSION N/A 05/28/2018   Procedure: CARDIOVERSION;  Surgeon: Minna Merritts, MD;  Location: [facility] ORS;  Service: Cardiovascular;  Laterality: N/A;  . CARDIOVERSION N/A 03/01/2020   Procedure: CARDIOVERSION;  Surgeon: Kate Sable, MD;  Location: [facility] ORS;  Service: Cardiovascular;  Laterality: N/A;  . CHOLECYSTECTOMY    . TONSILLECTOMY    . TUBAL LIGATION      Current Outpatient Medications  Medication Sig Dispense Refill  . acetaminophen (TYLENOL) 650 MG CR tablet Take 1,300 mg by mouth daily as needed for pain.     Marland Kitchen allopurinol (ZYLOPRIM) 100 MG tablet TAKE 1 TABLET DAILY 90 tablet 3  . amiodarone (PACERONE) 200 MG tablet TAKE ONE-HALF (1/2) TABLET TWICE A DAY 90 tablet 3  . amLODipine (NORVASC) 10 MG tablet TAKE 1 TABLET DAILY 90 tablet 3  . ascorbic acid (VITAMIN C) 1000  MG tablet Take by mouth.    Marland Kitchen atorvastatin (LIPITOR) 20 MG tablet Take 1 tablet (20 mg total) by mouth daily. 90 tablet 3  . Baclofen 5 MG TABS Take 5 mg by mouth at bedtime. 90 tablet 3  . COMBIVENT RESPIMAT 20-100 MCG/ACT AERS respimat INHALE 1 PUFF EVERY 6 HOURS AS NEEDED WHEEZING/ SHORTNESS OF BREATH 4 g 2  . CONTOUR NEXT TEST test strip Use to check blood sugar up to twice a day. 200 each 5  . desonide (DESOWEN) 0.05 % lotion Apply topically 2 (two) times daily. 59 mL 0  . Dulaglutide 1.5 MG/0.5ML SOPN Inject 1.5 mg into the skin once a week.    Marland Kitchen ELIQUIS 5 MG TABS tablet TAKE 1 TABLET TWICE A DAY 180 tablet 3  . furosemide (LASIX) 20 MG tablet TAKE 2 TABLETS EVERY MORNING 180 tablet 0  . levothyroxine (SYNTHROID) 25 MCG tablet TAKE 1 TABLET DAILY BEFORE BREAKFAST (Patient taking differently: Take 25 mcg by mouth  daily before breakfast.) 90 tablet 3  . lisinopril (PRINIVIL,ZESTRIL) 20 MG tablet Take 1 tablet (20 mg total) by mouth daily. 90 tablet 3  . metoprolol succinate (TOPROL-XL) 100 MG 24 hr tablet TAKE 1 TABLET DAILY WITH OR IMMEDIATELY FOLLOWING A MEAL 90 tablet 3  . Multiple Vitamin (MULTIVITAMIN) capsule Take 1 capsule by mouth daily.    Marland Kitchen omeprazole (PRILOSEC) 20 MG capsule TAKE 1 CAPSULE TWICE A DAY BEFORE MEALS 180 capsule 0  . temazepam (RESTORIL) 30 MG capsule TAKE 1 CAPSULE BY MOUTH AT BEDTIME AS NEEDED SLEEP 30 capsule 5   No current facility-administered medications for this visit.    Allergies  Allergen Reactions  . Morphine And Related Itching  . Percocet [Oxycodone-Acetaminophen] Other (See Comments)    Hallucinations       Review of Systems negative except from HPI and PMH  Physical Exam BP (!) 144/80   Pulse 74   Ht 4\' 11"  (1.499 m)   Wt 168 lb (76.2 kg)   BMI 33.93 kg/m  Well developed and well nourished in no acute distress HENT normal Neck supple   Clear Device pocket well healed; without hematoma or erythema.  There is no tethering  Regular rate and rhythm, no  murmur Abd-soft with active BS No Clubbing cyanosis 2+ edema Skin-warm and dry A & Oriented  Grossly normal sensory and motor function  ECG ventricular pacing at 74 with underlying atrial flutter  Efforts to pace terminated with overdrive pacing were unsuccessful.  With the inducing of more rapid flutters and then atrial fibrillation  Assessment and  Plan  Atrial fibrillation/flutter long-term persistent  Ventricular tachycardia-nonsustained  Hypertension   Pacemaker  Medtronic   DM-nephropathy  Complete heart block  High Risk Medication Surveillance   PVCs  Sleep disordered breathing and daytime somnolence    With her last efforts at cardioversion she held sinus rhythm for a few months and felt considerably better.  We will undertake this trial 1 more time and if we can  establish that she is better in sinus rhythm would consider catheter ablation given the fact that she is relatively young.  In anticipation of this we will increase her amiodarone from 200 today--400-day for about 3 weeks Check amiodarone surveillance laboratories prior to cardioversion  Her peripheral edema may be related to amlodipine; there is also however recent excess salt.  We will increase her diuretics for just 3 days  PVCs largely quiescient

## 2020-10-03 NOTE — Progress Notes (Signed)
Patient Care Team: Olin Hauser, DO as PCP - General (Family Medicine) Lavonia Dana, MD as Consulting Physician (Nephrology) Deboraha Sprang, MD as Consulting Physician (Cardiology) Edrick Kins, DPM as Consulting Physician (Podiatry) Vanita Ingles, RN as Case Manager (General Practice)   HPI  Renee Boyd is a 70 y.o. female Seen in followup for largely asymptomatic persistent atrial fibrillation.  She transferred care to here 7/17.  At that time, anticoagulation was initiated w Apixoban    She is s/p pacemaker insertion Medtronic 2014 for sinus node dysfunction and CHB   Previously exposed to [place] when started [employer].   Underwent cardioversion 7/21 with significant improvement in symptoms.  She reverted back to atrial fib/flutter shortly after her last office visit.  Does not feel as bad as she did; still however   intermittent symptoms of weakness and  shortness of breath.  Some edema; blames it on her recent bag of sun chips  Followed closely by nephrology Records and Results Reviewed Date Cr K Hgb LFTs TSH  10/17 1.36  8.8  0.304  2/18 1.73 4.9   0.6  6/18  1.3  11.1    1/20 1.2 4.3 11.3    1/21 1.32 3.2 9.6 48   7/21 2.37 4.2 12.4 38 0.59  8/21 2.64 (CE)      12/21 2.64 (CE)        DATE TEST EF   7/17 Echo   60-65 % Pulm HTN 64 (no E/E')  12/19  Echo  50-55% LVH severe (17/16)   PAsys 50-55   MR mild LAE(37/2.0/34)  1/20 CTA  CAD 2V   FFR > 0.8 LAD/RCA    .   Thromboembolic risk factors ( age -52, HTN-1, TIA/CVA-2, DM-1 , Gender-1) for a CHADSVASc Score of 6     Past Medical History:  Diagnosis Date  . Complete heart block ([place])    a. s/p MDT PPM 2006 with device generator replacement 2014; b. followed by Dr. Caryl Comes  . Diabetes ([place])   . GERD (gastroesophageal reflux disease)   . Hypertension   . Hypothyroid   . Pacemaker   . Persistent atrial fibrillation (HCC)    a. on Eliquis; b. CHADS2VASc 6 (HTN. age x 1, DM,  TIA x 2, female); c. s/p DCCV 05/28/18  . Pulmonary hypertension ([place])    a. echo 2017: EF of 60-65%, no RWMA, mildly dilated left atrium, RVSF normal, PASP 64 mmHg  . TIA (transient ischemic attack)     Past Surgical History:  Procedure Laterality Date  . ABDOMINAL HYSTERECTOMY    . CARDIOVERSION N/A 05/28/2018   Procedure: CARDIOVERSION;  Surgeon: Minna Merritts, MD;  Location: [facility] ORS;  Service: Cardiovascular;  Laterality: N/A;  . CARDIOVERSION N/A 03/01/2020   Procedure: CARDIOVERSION;  Surgeon: Kate Sable, MD;  Location: [facility] ORS;  Service: Cardiovascular;  Laterality: N/A;  . CHOLECYSTECTOMY    . TONSILLECTOMY    . TUBAL LIGATION      Current Outpatient Medications  Medication Sig Dispense Refill  . acetaminophen (TYLENOL) 650 MG CR tablet Take 1,300 mg by mouth daily as needed for pain.     Marland Kitchen allopurinol (ZYLOPRIM) 100 MG tablet TAKE 1 TABLET DAILY 90 tablet 3  . amiodarone (PACERONE) 200 MG tablet TAKE ONE-HALF (1/2) TABLET TWICE A DAY 90 tablet 3  . amLODipine (NORVASC) 10 MG tablet TAKE 1 TABLET DAILY 90 tablet 3  . ascorbic acid (VITAMIN C) 1000  MG tablet Take by mouth.    Marland Kitchen atorvastatin (LIPITOR) 20 MG tablet Take 1 tablet (20 mg total) by mouth daily. 90 tablet 3  . Baclofen 5 MG TABS Take 5 mg by mouth at bedtime. 90 tablet 3  . COMBIVENT RESPIMAT 20-100 MCG/ACT AERS respimat INHALE 1 PUFF EVERY 6 HOURS AS NEEDED WHEEZING/ SHORTNESS OF BREATH 4 g 2  . CONTOUR NEXT TEST test strip Use to check blood sugar up to twice a day. 200 each 5  . desonide (DESOWEN) 0.05 % lotion Apply topically 2 (two) times daily. 59 mL 0  . Dulaglutide 1.5 MG/0.5ML SOPN Inject 1.5 mg into the skin once a week.    Marland Kitchen ELIQUIS 5 MG TABS tablet TAKE 1 TABLET TWICE A DAY 180 tablet 3  . furosemide (LASIX) 20 MG tablet TAKE 2 TABLETS EVERY MORNING 180 tablet 0  . levothyroxine (SYNTHROID) 25 MCG tablet TAKE 1 TABLET DAILY BEFORE BREAKFAST (Patient taking differently: Take 25 mcg by mouth  daily before breakfast.) 90 tablet 3  . lisinopril (PRINIVIL,ZESTRIL) 20 MG tablet Take 1 tablet (20 mg total) by mouth daily. 90 tablet 3  . metoprolol succinate (TOPROL-XL) 100 MG 24 hr tablet TAKE 1 TABLET DAILY WITH OR IMMEDIATELY FOLLOWING A MEAL 90 tablet 3  . Multiple Vitamin (MULTIVITAMIN) capsule Take 1 capsule by mouth daily.    Marland Kitchen omeprazole (PRILOSEC) 20 MG capsule TAKE 1 CAPSULE TWICE A DAY BEFORE MEALS 180 capsule 0  . temazepam (RESTORIL) 30 MG capsule TAKE 1 CAPSULE BY MOUTH AT BEDTIME AS NEEDED SLEEP 30 capsule 5   No current facility-administered medications for this visit.    Allergies  Allergen Reactions  . Morphine And Related Itching  . Percocet [Oxycodone-Acetaminophen] Other (See Comments)    Hallucinations       Review of Systems negative except from HPI and PMH  Physical Exam BP (!) 144/80   Pulse 74   Ht 4\' 11"  (1.499 m)   Wt 168 lb (76.2 kg)   BMI 33.93 kg/m  Well developed and well nourished in no acute distress HENT normal Neck supple   Clear Device pocket well healed; without hematoma or erythema.  There is no tethering  Regular rate and rhythm, no  murmur Abd-soft with active BS No Clubbing cyanosis 2+ edema Skin-warm and dry A & Oriented  Grossly normal sensory and motor function  ECG ventricular pacing at 74 with underlying atrial flutter  Efforts to pace terminated with overdrive pacing were unsuccessful.  With the inducing of more rapid flutters and then atrial fibrillation  Assessment and  Plan  Atrial fibrillation/flutter long-term persistent  Ventricular tachycardia-nonsustained  Hypertension   Pacemaker  Medtronic   DM-nephropathy  Complete heart block  High Risk Medication Surveillance   PVCs  Sleep disordered breathing and daytime somnolence    With her last efforts at cardioversion she held sinus rhythm for a few months and felt considerably better.  We will undertake this trial 1 more time and if we can  establish that she is better in sinus rhythm would consider catheter ablation given the fact that she is relatively young.  In anticipation of this we will increase her amiodarone from 200 today--400-day for about 3 weeks Check amiodarone surveillance laboratories prior to cardioversion  Her peripheral edema may be related to amlodipine; there is also however recent excess salt.  We will increase her diuretics for just 3 days  PVCs largely quiescient

## 2020-10-04 ENCOUNTER — Other Ambulatory Visit: Payer: Self-pay | Admitting: Family Medicine

## 2020-10-04 DIAGNOSIS — E039 Hypothyroidism, unspecified: Secondary | ICD-10-CM

## 2020-10-05 ENCOUNTER — Encounter: Payer: Self-pay | Admitting: *Deleted

## 2020-10-05 ENCOUNTER — Telehealth: Payer: Self-pay | Admitting: Internal Medicine

## 2020-10-05 NOTE — Telephone Encounter (Signed)
Patient is calling to schedule cardioversion. Please call to discuss.

## 2020-10-05 NOTE — Telephone Encounter (Signed)
I called and spoke with the patient regarding her DCCV appt.  She is agreeable with proceeding with this in 3 weeks per Dr. Caryl Comes.  3/7- she has labs for Dr. Juleen China  3/14- she has an appointment with Dr. Juleen China 3/16- she wants to have her pre-procedure labs for Dr. Caryl Comes (we may just need a CBC/ TSH/ Liver- will make sure Dr. Juleen China drew an entire BMP on her) 3/23- DCCV with Dr. Garen Lah at 7:30 am 4/14- follow up with Dr. Caryl Comes at 9:20 am   The patient is voices understanding of DCCV instructions as stated below and is aware I will also mail a paper copy to her as well.    You are scheduled for a Cardioversion on Wednesday 10/25/20 with Dr. Garen Lah.  Please arrive at the [place] [facility] at 6:30 a.m. on the day of your procedure.  DIET INSTRUCTIONS:  Nothing to eat or drink after midnight the night prior to your procedure         1) Labs:   - Pre procedure lab work: Wednesday 10/18/20  [facility] at [facility] 1st desk on the right to check in, past the screening table Lab hours: Monday- Friday (7:30 am- 5:30 pm)  - Pre procedure COVID swab: Monday 10/23/20 (8:00 am- 1:00 pm) [facility] Drive up test only- staff will come out to the car to swab you   2) Medications:  You may take all of your regular medications the morning of your procedure with enough water to get them down safely unless listed below. DO NOT miss any doses of your Eliquis :  - HOLD lasix (furosemide) the morning of your procedure  3) Must have a responsible person to drive you home.  4) Bring a current list of your medications and current insurance cards.    If you have any questions after you get home, please call the office at [place], RN, BSN

## 2020-10-09 DIAGNOSIS — I129 Hypertensive chronic kidney disease with stage 1 through stage 4 chronic kidney disease, or unspecified chronic kidney disease: Secondary | ICD-10-CM | POA: Diagnosis not present

## 2020-10-09 DIAGNOSIS — N184 Chronic kidney disease, stage 4 (severe): Secondary | ICD-10-CM | POA: Diagnosis not present

## 2020-10-09 DIAGNOSIS — E872 Acidosis: Secondary | ICD-10-CM | POA: Diagnosis not present

## 2020-10-09 DIAGNOSIS — E1122 Type 2 diabetes mellitus with diabetic chronic kidney disease: Secondary | ICD-10-CM | POA: Diagnosis not present

## 2020-10-09 DIAGNOSIS — N2581 Secondary hyperparathyroidism of renal origin: Secondary | ICD-10-CM | POA: Diagnosis not present

## 2020-10-09 DIAGNOSIS — E875 Hyperkalemia: Secondary | ICD-10-CM | POA: Diagnosis not present

## 2020-10-09 DIAGNOSIS — R809 Proteinuria, unspecified: Secondary | ICD-10-CM | POA: Diagnosis not present

## 2020-10-11 DIAGNOSIS — E1159 Type 2 diabetes mellitus with other circulatory complications: Secondary | ICD-10-CM | POA: Diagnosis not present

## 2020-10-11 DIAGNOSIS — E782 Mixed hyperlipidemia: Secondary | ICD-10-CM | POA: Diagnosis not present

## 2020-10-11 LAB — HEMOGLOBIN A1C: Hemoglobin A1C: 7.6

## 2020-10-16 ENCOUNTER — Other Ambulatory Visit: Payer: Self-pay | Admitting: *Deleted

## 2020-10-16 DIAGNOSIS — N184 Chronic kidney disease, stage 4 (severe): Secondary | ICD-10-CM | POA: Diagnosis not present

## 2020-10-16 DIAGNOSIS — E875 Hyperkalemia: Secondary | ICD-10-CM | POA: Diagnosis not present

## 2020-10-16 DIAGNOSIS — E1122 Type 2 diabetes mellitus with diabetic chronic kidney disease: Secondary | ICD-10-CM | POA: Diagnosis not present

## 2020-10-16 DIAGNOSIS — E872 Acidosis: Secondary | ICD-10-CM | POA: Diagnosis not present

## 2020-10-16 DIAGNOSIS — I129 Hypertensive chronic kidney disease with stage 1 through stage 4 chronic kidney disease, or unspecified chronic kidney disease: Secondary | ICD-10-CM | POA: Diagnosis not present

## 2020-10-16 DIAGNOSIS — Z79899 Other long term (current) drug therapy: Secondary | ICD-10-CM

## 2020-10-16 DIAGNOSIS — N2581 Secondary hyperparathyroidism of renal origin: Secondary | ICD-10-CM | POA: Diagnosis not present

## 2020-10-16 DIAGNOSIS — I4819 Other persistent atrial fibrillation: Secondary | ICD-10-CM

## 2020-10-16 DIAGNOSIS — R809 Proteinuria, unspecified: Secondary | ICD-10-CM | POA: Diagnosis not present

## 2020-10-18 ENCOUNTER — Other Ambulatory Visit
Admission: RE | Admit: 2020-10-18 | Discharge: 2020-10-18 | Disposition: A | Discharge status: Home / Self Care | Payer: Medicare Other | Source: Ambulatory Visit | Attending: Internal Medicine | Admitting: Internal Medicine

## 2020-10-18 DIAGNOSIS — E1122 Type 2 diabetes mellitus with diabetic chronic kidney disease: Secondary | ICD-10-CM | POA: Diagnosis not present

## 2020-10-18 DIAGNOSIS — N1832 Chronic kidney disease, stage 3b: Secondary | ICD-10-CM | POA: Diagnosis not present

## 2020-10-18 DIAGNOSIS — I4819 Other persistent atrial fibrillation: Secondary | ICD-10-CM | POA: Insufficient documentation

## 2020-10-18 DIAGNOSIS — Z79899 Other long term (current) drug therapy: Secondary | ICD-10-CM | POA: Diagnosis not present

## 2020-10-18 DIAGNOSIS — I10 Essential (primary) hypertension: Secondary | ICD-10-CM | POA: Diagnosis not present

## 2020-10-18 DIAGNOSIS — I482 Chronic atrial fibrillation, unspecified: Secondary | ICD-10-CM | POA: Diagnosis not present

## 2020-10-18 DIAGNOSIS — E1159 Type 2 diabetes mellitus with other circulatory complications: Secondary | ICD-10-CM | POA: Diagnosis not present

## 2020-10-18 DIAGNOSIS — E782 Mixed hyperlipidemia: Secondary | ICD-10-CM | POA: Diagnosis not present

## 2020-10-18 DIAGNOSIS — E039 Hypothyroidism, unspecified: Secondary | ICD-10-CM | POA: Diagnosis not present

## 2020-10-18 LAB — HEPATIC FUNCTION PANEL
ALT: 30 U/L (ref 0–44)
AST: 28 U/L (ref 15–41)
Albumin: 4.5 g/dL (ref 3.5–5.0)
Alkaline Phosphatase: 65 U/L (ref 38–126)
Bilirubin, Direct: <0.1 mg/dL (ref 0.0–0.2)
Total Bilirubin: 0.6 mg/dL (ref 0.3–1.2)
Total Protein: 7.4 g/dL (ref 6.5–8.1)

## 2020-10-18 LAB — TSH: TSH: 0.642 u[IU]/mL (ref 0.350–4.500)

## 2020-10-23 ENCOUNTER — Other Ambulatory Visit: Payer: Self-pay

## 2020-10-23 ENCOUNTER — Other Ambulatory Visit
Admission: RE | Admit: 2020-10-23 | Discharge: 2020-10-23 | Disposition: A | Discharge status: Home / Self Care | Payer: Medicare Other | Source: Ambulatory Visit | Attending: Internal Medicine | Admitting: Internal Medicine

## 2020-10-23 ENCOUNTER — Other Ambulatory Visit: Payer: Medicare Other

## 2020-10-23 DIAGNOSIS — Z01812 Encounter for preprocedural laboratory examination: Secondary | ICD-10-CM | POA: Insufficient documentation

## 2020-10-23 DIAGNOSIS — Z20822 Contact with and (suspected) exposure to covid-19: Secondary | ICD-10-CM | POA: Insufficient documentation

## 2020-10-23 LAB — SARS CORONAVIRUS 2 (TAT 6-24 HRS): SARS Coronavirus 2: NEGATIVE

## 2020-10-24 ENCOUNTER — Other Ambulatory Visit: Payer: Self-pay | Admitting: Cardiology

## 2020-10-24 ENCOUNTER — Other Ambulatory Visit: Payer: Self-pay | Admitting: Internal Medicine

## 2020-10-24 ENCOUNTER — Telehealth: Payer: Self-pay | Admitting: Internal Medicine

## 2020-10-24 DIAGNOSIS — I4819 Other persistent atrial fibrillation: Secondary | ICD-10-CM

## 2020-10-24 NOTE — Telephone Encounter (Signed)
I spoke with the patient. I have advised her that her pre procedure COVID test was negative.   The patient voices understanding.

## 2020-10-24 NOTE — Telephone Encounter (Signed)
Patient calling  Would like to know results of COVID test Please call to discuss

## 2020-10-25 ENCOUNTER — Ambulatory Visit
Admission: RE | Admit: 2020-10-25 | Discharge: 2020-10-25 | Disposition: A | Discharge status: Home / Self Care | Payer: Medicare Other | Attending: Cardiology | Admitting: Cardiology

## 2020-10-25 ENCOUNTER — Ambulatory Visit: Payer: Medicare Other | Admitting: Certified Registered"

## 2020-10-25 ENCOUNTER — Encounter
Admission: RE | Disposition: A | Discharge status: Home / Self Care | Payer: Self-pay | Source: Home / Self Care | Attending: Cardiology

## 2020-10-25 ENCOUNTER — Other Ambulatory Visit: Payer: Self-pay

## 2020-10-25 ENCOUNTER — Encounter: Payer: Self-pay | Admitting: Cardiology

## 2020-10-25 DIAGNOSIS — K219 Gastro-esophageal reflux disease without esophagitis: Secondary | ICD-10-CM | POA: Diagnosis not present

## 2020-10-25 DIAGNOSIS — Z7901 Long term (current) use of anticoagulants: Secondary | ICD-10-CM | POA: Diagnosis not present

## 2020-10-25 DIAGNOSIS — Z79890 Hormone replacement therapy (postmenopausal): Secondary | ICD-10-CM | POA: Diagnosis not present

## 2020-10-25 DIAGNOSIS — I495 Sick sinus syndrome: Secondary | ICD-10-CM | POA: Diagnosis not present

## 2020-10-25 DIAGNOSIS — Z885 Allergy status to narcotic agent status: Secondary | ICD-10-CM | POA: Insufficient documentation

## 2020-10-25 DIAGNOSIS — I4891 Unspecified atrial fibrillation: Secondary | ICD-10-CM | POA: Diagnosis not present

## 2020-10-25 DIAGNOSIS — I10 Essential (primary) hypertension: Secondary | ICD-10-CM | POA: Diagnosis not present

## 2020-10-25 DIAGNOSIS — E119 Type 2 diabetes mellitus without complications: Secondary | ICD-10-CM | POA: Diagnosis not present

## 2020-10-25 DIAGNOSIS — Z79899 Other long term (current) drug therapy: Secondary | ICD-10-CM | POA: Diagnosis not present

## 2020-10-25 DIAGNOSIS — I4819 Other persistent atrial fibrillation: Secondary | ICD-10-CM | POA: Diagnosis not present

## 2020-10-25 DIAGNOSIS — I442 Atrioventricular block, complete: Secondary | ICD-10-CM | POA: Diagnosis not present

## 2020-10-25 DIAGNOSIS — I4892 Unspecified atrial flutter: Secondary | ICD-10-CM | POA: Diagnosis not present

## 2020-10-25 DIAGNOSIS — E039 Hypothyroidism, unspecified: Secondary | ICD-10-CM | POA: Diagnosis not present

## 2020-10-25 DIAGNOSIS — Z950 Presence of cardiac pacemaker: Secondary | ICD-10-CM | POA: Insufficient documentation

## 2020-10-25 HISTORY — PX: CARDIOVERSION: SHX1299

## 2020-10-25 LAB — GLUCOSE, CAPILLARY: Glucose-Capillary: 130 mg/dL — ABNORMAL HIGH (ref 70–99)

## 2020-10-25 SURGERY — CARDIOVERSION
Anesthesia: General

## 2020-10-25 MED ORDER — PROPOFOL 10 MG/ML IV BOLUS
INTRAVENOUS | Status: DC | PRN
  Administered 2020-10-25: 50 mg via INTRAVENOUS

## 2020-10-25 MED ORDER — PROPOFOL 500 MG/50ML IV EMUL
INTRAVENOUS | Status: AC
  Filled 2020-10-25: qty 50

## 2020-10-25 NOTE — Anesthesia Postprocedure Evaluation (Signed)
Anesthesia Post Note  Patient: MEKO BELLANGER  Procedure(s) Performed: CARDIOVERSION (N/A )  Patient location during evaluation: Other (specials recovery) Anesthesia Type: General Level of consciousness: awake and alert and oriented Pain management: pain level controlled Vital Signs Assessment: post-procedure vital signs reviewed and stable Respiratory status: spontaneous breathing, nonlabored ventilation and respiratory function stable Cardiovascular status: blood pressure returned to baseline and stable Postop Assessment: no signs of nausea or vomiting Anesthetic complications: no   No complications documented.   Last Vitals:  Vitals:   10/25/20 0800 10/25/20 0815  BP:  129/69  Pulse: (!) 59 (!) 59  Resp: 17 17  Temp:    SpO2: 99% 96%    Last Pain:  Vitals:   10/25/20 0815  TempSrc:   PainSc: 0-No pain                 Brynn Reznik

## 2020-10-25 NOTE — Procedures (Signed)
Cardioversion procedure note For atrial flutter.  Procedure Details:  Consent: Risks of procedure as well as the alternatives and risks of each were explained to the (patient/caregiver).  Consent for procedure obtained.  Time Out: Verified patient identification, verified procedure, site/side was marked, verified correct patient position, special equipment/implants available, medications/allergies/relevent history reviewed, required imaging and test results available.  Performed  Patient placed on cardiac monitor, pulse oximetry, supplemental oxygen as necessary.   Sedation given: propofol IV per anesthesia team  Pacer pads placed anterior and posterior chest.   Cardioverted 1 time(s).   Cardioverted at  [place]. Synchronized biphasic Converted to A-paced, V-paced rhythm   Evaluation: Findings: Post procedure EKG shows: A-paced, V-paced rhythm Complications: None Patient did tolerate procedure well.  Time Spent Directly with the Patient:  35 minutes   Kate Sable, M.D.

## 2020-10-25 NOTE — Discharge Instructions (Signed)
Moderate Conscious Sedation, Adult, Care After This sheet gives you information about how to care for yourself after your procedure. Your health care provider may also give you more specific instructions. If you have problems or questions, contact your health care provider. What can I expect after the procedure? After the procedure, it is common to have:  Sleepiness for several hours.  Impaired judgment for several hours.  Difficulty with balance.  Vomiting if you eat too soon. Follow these instructions at home: For the time period you were told by your health care provider:  Rest.  Do not participate in activities where you could fall or become injured.  Do not drive or use machinery.  Do not drink alcohol.  Do not take sleeping pills or medicines that cause drowsiness.  Do not make important decisions or sign legal documents.  Do not take care of children on your own.      Eating and drinking  Follow the diet recommended by your health care provider.  Drink enough fluid to keep your urine pale yellow.  If you vomit: ? Drink water, juice, or soup when you can drink without vomiting. ? Make sure you have little or no nausea before eating solid foods.   General instructions  Take over-the-counter and prescription medicines only as told by your health care provider.  Have a responsible adult stay with you for the time you are told. It is important to have someone help care for you until you are awake and alert.  Do not smoke.  Keep all follow-up visits as told by your health care provider. This is important. Contact a health care provider if:  You are still sleepy or having trouble with balance after 24 hours.  You feel light-headed.  You keep feeling nauseous or you keep vomiting.  You develop a rash.  You have a fever.  You have redness or swelling around the IV site. Get help right away if:  You have trouble breathing.  You have new-onset confusion at  home. Summary  After the procedure, it is common to feel sleepy, have impaired judgment, or feel nauseous if you eat too soon.  Rest after you get home. Know the things you should not do after the procedure.  Follow the diet recommended by your health care provider and drink enough fluid to keep your urine pale yellow.  Get help right away if you have trouble breathing or new-onset confusion at home. This information is not intended to replace advice given to you by your health care provider. Make sure you discuss any questions you have with your health care provider. Document Revised: 11/19/2019 Document Reviewed: 06/17/2019 Elsevier Patient Education  2021 Else[employer] Inc[employer] Electrical Cardioversion Electrical cardioversion is the delivery of a jolt of electricity to restore a normal rhythm to the heart. A rhythm that is too fast or is not regular keeps the heart from pumping well. In this procedure, sticky patches or metal paddles are placed on the chest to deliver electricity to the heart from a device. This procedure may be done in an emergency if:  There is low or no blood pressure as a result of the heart rhythm.  Normal rhythm must be restored as fast as possible to protect the brain and heart from further damage.  It may save a life. This may also be a scheduled procedure for irregular or fast heart rhythms that are not immediately life-threatening. Tell a health care provider about:  Any allergies you have.    All medicines you are taking, including vitamins, herbs, eye drops, creams, and over-the-counter medicines.  Any problems you or family members have had with anesthetic medicines.  Any blood disorders you have.  Any surgeries you have had.  Any medical conditions you have.  Whether you are pregnant or may be pregnant. What are the risks? Generally, this is a safe procedure. However, problems may occur, including:  Allergic reactions to medicines.  A blood clot  that breaks free and travels to other parts of your body.  The possible return of an abnormal heart rhythm within hours or days after the procedure.  Your heart stopping (cardiac arrest). This is rare. What happens before the procedure? Medicines  Your health care provider may have you start taking: ? Blood-thinning medicines (anticoagulants) so your blood does not clot as easily. ? Medicines to help stabilize your heart rate and rhythm.  Ask your health care provider about: ? Changing or stopping your regular medicines. This is especially important if you are taking diabetes medicines or blood thinners. ? Taking medicines such as aspirin and ibuprofen. These medicines can thin your blood. Do not take these medicines unless your health care provider tells you to take them. ? Taking over-the-counter medicines, vitamins, herbs, and supplements. General instructions  Follow instructions from your health care provider about eating or drinking restrictions.  Plan to have someone take you home from the hospital or clinic.  If you will be going home right after the procedure, plan to have someone with you for 24 hours.  Ask your health care provider what steps will be taken to help prevent infection. These may include washing your skin with a germ-killing soap. What happens during the procedure?  An IV will be inserted into one of your veins.  Sticky patches (electrodes) or metal paddles may be placed on your chest.  You will be given a medicine to help you relax (sedative).  An electrical shock will be delivered. The procedure may vary among health care providers and hospitals.   What can I expect after the procedure?  Your blood pressure, heart rate, breathing rate, and blood oxygen level will be monitored until you leave the hospital or clinic.  Your heart rhythm will be watched to make sure it does not change.  You may have some redness on the skin where the shocks were  given. Follow these instructions at home:  Do not drive for 24 hours if you were given a sedative during your procedure.  Take over-the-counter and prescription medicines only as told by your health care provider.  Ask your health care provider how to check your pulse. Check it often.  Rest for 48 hours after the procedure or as told by your health care provider.  Avoid or limit your caffeine use as told by your health care provider.  Keep all follow-up visits as told by your health care provider. This is important. Contact a health care provider if:  You feel like your heart is beating too quickly or your pulse is not regular.  You have a serious muscle cramp that does not go away. Get help right away if:  You have discomfort in your chest.  You are dizzy or you feel faint.  You have trouble breathing or you are short of breath.  Your speech is slurred.  You have trouble moving an arm or leg on one side of your body.  Your fingers or toes turn cold or blue. Summary  Electrical cardioversion is   the delivery of a jolt of electricity to restore a normal rhythm to the heart.  This procedure may be done right away in an emergency or may be a scheduled procedure if the condition is not an emergency.  Generally, this is a safe procedure.  After the procedure, check your pulse often as told by your health care provider. This information is not intended to replace advice given to you by your health care provider. Make sure you discuss any questions you have with your health care provider. Document Revised: 02/22/2019 Document Reviewed: 02/22/2019 Elsevier Patient Education  2021 Elsevier Inc.  

## 2020-10-25 NOTE — Transfer of Care (Signed)
Immediate Anesthesia Transfer of Care Note  Patient: Renee Boyd  Procedure(s) Performed: CARDIOVERSION (N/A )  Patient Location: spu  Anesthesia Type:General  Level of Consciousness: awake  Airway & Oxygen Therapy: Patient Spontanous Breathing and Patient connected to nasal cannula oxygen  Post-op Assessment: Report given to RN and Post -op Vital signs reviewed and stable  Post vital signs: Reviewed  Last Vitals:  Vitals Value Taken Time  BP 125/71 10/25/20 0748  Temp    Pulse 80 10/25/20 0748  Resp 16 10/25/20 0748  SpO2 98 % 10/25/20 0748    Last Pain:  Vitals:   10/25/20 0657  TempSrc: Oral  PainSc: 0-No pain         Complications: No complications documented.

## 2020-10-25 NOTE — Anesthesia Preprocedure Evaluation (Signed)
Anesthesia Evaluation  Patient identified by MRN, date of birth, ID band Patient awake    Reviewed: Allergy & Precautions, NPO status , Patient's Chart, lab work & pertinent test results  History of Anesthesia Complications Negative for: history of anesthetic complications  Airway Mallampati: II  TM Distance: >3 FB Neck ROM: Full    Dental  (+) Upper Dentures, Missing, Poor Dentition   Pulmonary neg sleep apnea, neg COPD, former smoker,    breath sounds clear to auscultation- rhonchi (-) wheezing      Cardiovascular hypertension, Pt. on medications (-) CAD, (-) Past MI, (-) Cardiac Stents and (-) CABG + dysrhythmias Atrial Fibrillation + pacemaker  Rhythm:Regular Rate:Normal - Systolic murmurs and - Diastolic murmurs    Neuro/Psych neg Seizures TIAnegative psych ROS   GI/Hepatic Neg liver ROS, GERD  ,  Endo/Other  diabetesHypothyroidism   Renal/GU CRFRenal disease     Musculoskeletal negative musculoskeletal ROS (+)   Abdominal (+) + obese,   Peds  Hematology  (+) anemia ,   Anesthesia Other Findings Past Medical History: No date: Complete heart block (HCC)     Comment:  a. s/p MDT PPM 2006 with device generator replacement               2014; b. followed by Dr. Caryl Comes No date: Diabetes ([place]) No date: GERD (gastroesophageal reflux disease) No date: Hypertension No date: Hypothyroid No date: Pacemaker No date: Persistent atrial fibrillation ([place])     Comment:  a. on Eliquis; b. CHADS2VASc 6 (HTN. age x 1, DM, TIA x               2, female); c. s/p DCCV 05/28/18 No date: Pulmonary hypertension ([place])     Comment:  a. echo 2017: EF of 60-65%, no RWMA, mildly dilated left              atrium, RVSF normal, PASP 64 mmHg No date: TIA (transient ischemic attack)   Reproductive/Obstetrics                             Anesthesia Physical Anesthesia Plan  ASA: III  Anesthesia Plan: General    Post-op Pain Management:    Induction: Intravenous  PONV Risk Score and Plan: 2 and Propofol infusion  Airway Management Planned: Natural Airway  Additional Equipment:   Intra-op Plan:   Post-operative Plan:   Informed Consent: I have reviewed the patients History and Physical, chart, labs and discussed the procedure including the risks, benefits and alternatives for the proposed anesthesia with the patient or authorized representative who has indicated his/her understanding and acceptance.     Dental advisory given  Plan Discussed with: CRNA and Anesthesiologist  Anesthesia Plan Comments:         Anesthesia Quick Evaluation

## 2020-10-25 NOTE — Interval H&P Note (Signed)
History and Physical Interval Note:  10/25/2020 7:24 AM  Renee Boyd  has presented today for surgery, with the diagnosis of atrial flutter.  The various methods of treatment have been discussed with the patient and family. After consideration of risks, benefits and other options for treatment, the patient has consented to  Procedure(s): CARDIOVERSION (N/A) as a surgical intervention.  The patient's history has been reviewed, patient examined, no change in status, stable for surgery.  I have reviewed the patient's chart and labs.  Questions were answered to the patient's satisfaction.     Aaron Edelman [place]

## 2020-11-01 DIAGNOSIS — N184 Chronic kidney disease, stage 4 (severe): Secondary | ICD-10-CM | POA: Diagnosis not present

## 2020-11-14 ENCOUNTER — Other Ambulatory Visit: Payer: Self-pay

## 2020-11-14 DIAGNOSIS — K219 Gastro-esophageal reflux disease without esophagitis: Secondary | ICD-10-CM

## 2020-11-14 MED ORDER — OMEPRAZOLE 20 MG PO CPDR: 20.0000 mg | DELAYED_RELEASE_CAPSULE | Freq: Two times a day (BID) | ORAL | 2 refills | Status: DC

## 2020-11-16 ENCOUNTER — Ambulatory Visit (INDEPENDENT_AMBULATORY_CARE_PROVIDER_SITE_OTHER): Payer: Medicare Other | Admitting: Internal Medicine

## 2020-11-16 ENCOUNTER — Encounter: Payer: Self-pay | Admitting: Internal Medicine

## 2020-11-16 ENCOUNTER — Other Ambulatory Visit: Payer: Self-pay

## 2020-11-16 VITALS — BP 130/76 | HR 85 | Ht 59.0 in | Wt 169.0 lb

## 2020-11-16 DIAGNOSIS — I472 Ventricular tachycardia: Secondary | ICD-10-CM

## 2020-11-16 DIAGNOSIS — I4729 Other ventricular tachycardia: Secondary | ICD-10-CM

## 2020-11-16 DIAGNOSIS — Z79899 Other long term (current) drug therapy: Secondary | ICD-10-CM

## 2020-11-16 DIAGNOSIS — R072 Precordial pain: Secondary | ICD-10-CM | POA: Diagnosis not present

## 2020-11-16 DIAGNOSIS — R931 Abnormal findings on diagnostic imaging of heart and coronary circulation: Secondary | ICD-10-CM

## 2020-11-16 DIAGNOSIS — I4819 Other persistent atrial fibrillation: Secondary | ICD-10-CM | POA: Diagnosis not present

## 2020-11-16 DIAGNOSIS — I442 Atrioventricular block, complete: Secondary | ICD-10-CM

## 2020-11-16 DIAGNOSIS — I10 Essential (primary) hypertension: Secondary | ICD-10-CM

## 2020-11-16 DIAGNOSIS — Z950 Presence of cardiac pacemaker: Secondary | ICD-10-CM | POA: Diagnosis not present

## 2020-11-16 MED ORDER — FUROSEMIDE 20 MG PO TABS: ORAL_TABLET | ORAL | 1 refills | Status: DC

## 2020-11-16 MED ORDER — METOPROLOL TARTRATE 100 MG PO TABS: ORAL_TABLET | ORAL | 0 refills | Status: DC

## 2020-11-16 MED ORDER — AMIODARONE HCL 200 MG PO TABS: ORAL_TABLET | ORAL | 1 refills | Status: DC

## 2020-11-16 NOTE — Patient Instructions (Addendum)
Medication Instructions:  - Your physician has recommended you make the following change in your medication:   1) DECREASE amiodarone 200 mg- take 1.5 tablets (300 mg) by mouth once daily   2) INCREASE lasix (furosemide) 20 mg- take 3 tablets (60 mg) by mouth once daily    *If you need a refill on your cardiac medications before your next appointment, please call your pharmacy*   Lab Work: - none ordered  If you have labs (blood work) drawn today and your tests are completely normal, you will receive your results only by: Marland Kitchen MyChart Message (if you have MyChart) OR . A paper copy in the mail If you have any lab test that is abnormal or we need to change your treatment, we will call you to review the results.   Testing/Procedures: - Your physician has requested that you have cardiac CT. Cardiac computed tomography (CT) is a painless test that uses an x-ray machine to take clear, detailed pictures of your heart.    Your cardiac CT will be scheduled at one of the below locations:   [facility] [address] [place], [place] [address] [phone]  [facility] [address] [place], [place] [address] [phone]  If scheduled at [facility], please arrive at the [facility] main entrance (entrance A) of [facility] 30 minutes prior to test start time. Proceed to the [facility] Radiology Department (first floor) to check-in and test prep.  If scheduled at [facility], please arrive 15 mins early for check-in and test prep.  Please follow these instructions carefully (unless otherwise directed):   On the Night Before the Test: . Be sure to Drink plenty of water. . Do not consume any caffeinated/decaffeinated beverages or chocolate 12 hours prior to your test. . Do not take any antihistamines 12 hours prior to your test.  On the Day of the Test: . Drink plenty of  water until 1 hour prior to the test. . Do not eat any food 4 hours prior to the test. . You may take your regular medications prior to the test.  . Take metoprolol (Lopressor) 100 mg two hours prior to test. . HOLD Furosemide morning of the test. . FEMALES- please wear underwire-free bra if available       After the Test: . Drink plenty of water. . After receiving IV contrast, you may experience a mild flushed feeling. This is normal. . On occasion, you may experience a mild rash up to 24 hours after the test. This is not dangerous. If this occurs, you can take Benadryl 25 mg and increase your fluid intake. . If you experience trouble breathing, this can be serious. If it is severe call 911 IMMEDIATELY. If it is mild, please call our office.   Once we have confirmed authorization from your insurance company, we will call you to set up a date and time for your test. Based on how quickly your insurance processes prior authorizations requests, please allow up to 4 weeks to be contacted for scheduling your Cardiac CT appointment. Be advised that routine Cardiac CT appointments could be scheduled as many as 8 weeks after your provider has ordered it.  For non-scheduling related questions, please contact the cardiac imaging nurse navigator should you have any questions/concerns: Marchia Bond, Cardiac Imaging Nurse Navigator Gordy Clement, Cardiac Imaging Nurse Navigator [facility] Direct Office Dial: [phone]  For scheduling needs, including cancellations and rescheduling, please call [place], [phone].   Follow-Up: At [facility], you and your health needs are our priority.  As part of our continuing mission to provide you with exceptional heart care, we have created designated Provider Care Teams.  These Care Teams include your primary Cardiologist (physician) and Advanced Practice Providers (APPs -  Physician Assistants and Nurse Practitioners) who  all work together to provide you with the care you need, when you need it.  We recommend signing up for the patient portal called "MyChart".  Sign up information is provided on this After Visit Summary.  MyChart is used to connect with patients for Virtual Visits (Telemedicine).  Patients are able to view lab/test results, encounter notes, upcoming appointments, etc.  Non-urgent messages can be sent to your provider as well.   To learn more about what you can do with MyChart, go to [email or web address].    Your next appointment:   6 month(s)  The format for your next appointment:   In Person  Provider:   Virl Axe, MD   Other Instructions   Cardiac CT Angiogram A cardiac CT angiogram is a procedure to look at the heart and the area around the heart. It may be done to help find the cause of chest pains or other symptoms of heart disease. During this procedure, a substance called contrast dye is injected into the blood vessels in the area to be checked. A large X-ray machine, called a CT scanner, then takes detailed pictures of the heart and the surrounding area. The procedure is also sometimes called a coronary CT angiogram, coronary artery scanning, or CTA. A cardiac CT angiogram allows the health care provider to see how well blood is flowing to and from the heart. The health care provider will be able to see if there are any problems, such as:  Blockage or narrowing of the coronary arteries in the heart.  Fluid around the heart.  Signs of weakness or disease in the muscles, valves, and tissues of the heart. Tell a health care provider about:  Any allergies you have. This is especially important if you have had a previous allergic reaction to contrast dye.  All medicines you are taking, including vitamins, herbs, eye drops, creams, and over-the-counter medicines.  Any blood disorders you have.  Any surgeries you have had.  Any medical conditions you have.  Whether you  are pregnant or may be pregnant.  Any anxiety disorders, chronic pain, or other conditions you have that may increase your stress or prevent you from lying still. What are the risks? Generally, this is a safe procedure. However, problems may occur, including:  Bleeding.  Infection.  Allergic reactions to medicines or dyes.  Damage to other structures or organs.  Kidney damage from the contrast dye that is used.  Increased risk of cancer from radiation exposure. This risk is low. Talk with your health care provider about: ? The risks and benefits of testing. ? How you can receive the lowest dose of radiation. What happens before the procedure?  Wear comfortable clothing and remove any jewelry, glasses, dentures, and hearing aids.  Follow instructions from your health care provider about eating and drinking. This may include: ? For 12 hours before the procedure -- avoid caffeine. This includes tea, coffee, soda, energy drinks, and diet pills. Drink plenty of water or other fluids that do not have caffeine in them. Being well hydrated can prevent complications. ? For 4-6 hours before  the procedure -- stop eating and drinking. The contrast dye can cause nausea, but this is less likely if your stomach is empty.  Ask your health care provider about changing or stopping your regular medicines. This is especially important if you are taking diabetes medicines, blood thinners, or medicines to treat problems with erections (erectile dysfunction). What happens during the procedure?  Hair on your chest may need to be removed so that small sticky patches called electrodes can be placed on your chest. These will transmit information that helps to monitor your heart during the procedure.  An IV will be inserted into one of your veins.  You might be given a medicine to control your heart rate during the procedure. This will help to ensure that good images are obtained.  You will be asked to lie  on an exam table. This table will slide in and out of the CT machine during the procedure.  Contrast dye will be injected into the IV. You might feel warm, or you may get a metallic taste in your mouth.  You will be given a medicine called nitroglycerin. This will relax or dilate the arteries in your heart.  The table that you are lying on will move into the CT machine tunnel for the scan.  The person running the machine will give you instructions while the scans are being done. You may be asked to: ? Keep your arms above your head. ? Hold your breath. ? Stay very still, even if the table is moving.  When the scanning is complete, you will be moved out of the machine.  The IV will be removed. The procedure may vary among health care providers and hospitals.   What can I expect after the procedure? After your procedure, it is common to have:  A metallic taste in your mouth from the contrast dye.  A feeling of warmth.  A headache from the nitroglycerin. Follow these instructions at home:  Take over-the-counter and prescription medicines only as told by your health care provider.  If you are told, drink enough fluid to keep your urine pale yellow. This will help to flush the contrast dye out of your body.  Most people can return to their normal activities right after the procedure. Ask your health care provider what activities are safe for you.  It is up to you to get the results of your procedure. Ask your health care provider, or the department that is doing the procedure, when your results will be ready.  Keep all follow-up visits as told by your health care provider. This is important. Contact a health care provider if:  You have any symptoms of allergy to the contrast dye. These include: ? Shortness of breath. ? Rash or hives. ? A racing heartbeat. Summary  A cardiac CT angiogram is a procedure to look at the heart and the area around the heart. It may be done to help  find the cause of chest pains or other symptoms of heart disease.  During this procedure, a large X-ray machine, called a CT scanner, takes detailed pictures of the heart and the surrounding area after a contrast dye has been injected into blood vessels in the area.  Ask your health care provider about changing or stopping your regular medicines before the procedure. This is especially important if you are taking diabetes medicines, blood thinners, or medicines to treat erectile dysfunction.  If you are told, drink enough fluid to keep your urine pale yellow.  This will help to flush the contrast dye out of your body. This information is not intended to replace advice given to you by your health care provider. Make sure you discuss any questions you have with your health care provider. Document Revised: 03/17/2019 Document Reviewed: 03/17/2019 Elsevier Patient Education  2021 [employer].

## 2020-11-16 NOTE — Progress Notes (Signed)
Patient Care Team: Olin Hauser, DO as PCP - General (Family Medicine) Lavonia Dana, MD as Consulting Physician (Nephrology) Deboraha Sprang, MD as Consulting Physician (Cardiology) Edrick Kins, DPM as Consulting Physician (Podiatry) Vanita Ingles, RN as Case Manager (General Practice)   HPI  Renee Boyd is a 70 y.o. female Seen in followup for largely asymptomatic persistent atrial fibrillation.  She transferred care to here 7/17.  At that time, anticoagulation was initiated w Apixoban    She is s/p pacemaker insertion Medtronic 2014 for sinus node dysfunction and CHB   Previously exposed to [place] when started [employer].   Underwent cardioversion 7/21 with significant improvement in symptoms.  She reverted back to atrial fib/flutter shortly after her last office visit.  Anticipated repeat cardioversion following reloading with amiodarone to assess benefit of restoring sinus rhythm done 3/22 Feels better in sinus, and this notwithstanding that she has more salt and water intake and has more edema.  Complaints of 2 episodes of chest discomfort both lasting 5-15 minutes associated with effort and stress.  No radiation.  No associated dyspnea.  Followed closely by nephrology Records and Results Reviewed Date Cr K Hgb LFTs TSH  10/17 1.36  8.8  0.304  2/18 1.73 4.9   0.6  6/18  1.3  11.1    1/20 1.2 4.3 11.3    1/21 1.32 3.2 9.6 48   7/21 2.37 4.2 12.4 38 0.59  8/21 2.64 (CE)      12/21 2.64 (CE)       3/22 1.9. 4.5 12.1 28 0.642   DATE TEST EF   7/17 Echo   60-65 % Pulm HTN 64 (no E/E')  12/19  Echo  50-55% LVH severe (17/16)   PAsys 50-55   MR mild LAE(37/2.0/34)  1/20 CTA  CAD 2V   FFR > 0.8 LAD/RCA    .   Thromboembolic risk factors ( age -34, HTN-1, TIA/CVA-2, DM-1 , Gender-1) for a CHADSVASc Score of 6     Past Medical History:  Diagnosis Date  . Complete heart block ([place])    a. s/p MDT PPM 2006 with device generator  replacement 2014; b. followed by Dr. Caryl Comes  . Diabetes ([place])   . GERD (gastroesophageal reflux disease)   . Hypertension   . Hypothyroid   . Pacemaker   . Persistent atrial fibrillation (HCC)    a. on Eliquis; b. CHADS2VASc 6 (HTN. age x 1, DM, TIA x 2, female); c. s/p DCCV 05/28/18  . Pulmonary hypertension ([place])    a. echo 2017: EF of 60-65%, no RWMA, mildly dilated left atrium, RVSF normal, PASP 64 mmHg  . TIA (transient ischemic attack)     Past Surgical History:  Procedure Laterality Date  . ABDOMINAL HYSTERECTOMY    . CARDIOVERSION N/A 05/28/2018   Procedure: CARDIOVERSION;  Surgeon: Minna Merritts, MD;  Location: [facility] ORS;  Service: Cardiovascular;  Laterality: N/A;  . CARDIOVERSION N/A 03/01/2020   Procedure: CARDIOVERSION;  Surgeon: Kate Sable, MD;  Location: [facility] ORS;  Service: Cardiovascular;  Laterality: N/A;  . CARDIOVERSION N/A 10/25/2020   Procedure: CARDIOVERSION;  Surgeon: Kate Sable, MD;  Location: [facility] ORS;  Service: Cardiovascular;  Laterality: N/A;  . CHOLECYSTECTOMY    . TONSILLECTOMY    . TUBAL LIGATION      Current Outpatient Medications  Medication Sig Dispense Refill  . acetaminophen (TYLENOL) 650 MG CR tablet Take 1,300 mg by mouth every 8 (eight)  hours as needed for pain.    Marland Kitchen allopurinol (ZYLOPRIM) 100 MG tablet Take 100 mg by mouth daily.    Marland Kitchen amiodarone (PACERONE) 200 MG tablet Take 1 tablet (200 mg) by mouth twice daily    . amLODipine (NORVASC) 10 MG tablet Take 10 mg by mouth daily.    Marland Kitchen ascorbic acid (VITAMIN C) 1000 MG tablet Take 1,000 mg by mouth daily.    Marland Kitchen atorvastatin (LIPITOR) 20 MG tablet Take 1 tablet (20 mg total) by mouth daily. 90 tablet 3  . Baclofen 5 MG TABS Take 5 mg by mouth at bedtime. 90 tablet 3  . COMBIVENT RESPIMAT 20-100 MCG/ACT AERS respimat INHALE 1 PUFF EVERY 6 HOURS AS NEEDED WHEEZING/ SHORTNESS OF BREATH (Patient taking differently: Inhale 1 puff into the lungs every 6 (six) hours as needed for  wheezing or shortness of breath.) 4 g 2  . CONTOUR NEXT TEST test strip Use to check blood sugar up to twice a day. 200 each 5  . COVID-19 mRNA vaccine, Pfizer, 30 MCG/0.3ML injection USE AS DIRECTED .3 mL 0  . desonide (DESOWEN) 0.05 % lotion Apply topically 2 (two) times daily. (Patient taking differently: Apply 1 application topically 2 (two) times daily as needed (irritation).) 59 mL 0  . Dulaglutide 1.5 MG/0.5ML SOPN Inject 1.5 mg into the skin every Tuesday.    Marland Kitchen ELIQUIS 5 MG TABS tablet TAKE 1 TABLET TWICE A DAY (Patient taking differently: Take 5 mg by mouth 2 (two) times daily.) 180 tablet 3  . furosemide (LASIX) 20 MG tablet TAKE 2 TABLETS EVERY MORNING (Patient taking differently: Take 40 mg by mouth daily. TAKE 2 TABLETS EVERY MORNING) 180 tablet 0  . levothyroxine (SYNTHROID) 25 MCG tablet TAKE 1 TABLET DAILY BEFORE BREAKFAST (Patient taking differently: Take 25 mcg by mouth daily before breakfast.) 90 tablet 0  . lisinopril (PRINIVIL,ZESTRIL) 20 MG tablet Take 1 tablet (20 mg total) by mouth daily. 90 tablet 3  . metoprolol succinate (TOPROL-XL) 100 MG 24 hr tablet TAKE 1 TABLET DAILY WITH OR IMMEDIATELY FOLLOWING A MEAL (Patient taking differently: Take 100 mg by mouth daily.) 90 tablet 3  . Multiple Vitamin (MULTIVITAMIN) capsule Take 1 capsule by mouth daily.    Marland Kitchen omeprazole (PRILOSEC) 20 MG capsule Take 1 capsule (20 mg total) by mouth 2 (two) times daily before a meal. 180 capsule 2  . temazepam (RESTORIL) 30 MG capsule TAKE 1 CAPSULE BY MOUTH AT BEDTIME AS NEEDED SLEEP (Patient taking differently: Take 30 mg by mouth at bedtime as needed for sleep.) 30 capsule 5   No current facility-administered medications for this visit.    Allergies  Allergen Reactions  . Morphine And Related Itching  . Percocet [Oxycodone-Acetaminophen] Other (See Comments)    Hallucinations       Review of Systems negative except from HPI and PMH  Physical Exam BP 130/76   Pulse 85   Ht 4\' 11"   (1.499 m)   Wt 169 lb (76.7 kg)   BMI 34.13 kg/m  Well developed and well nourished in no acute distress HENT normal Neck supple with JVP-8-10 clear Device pocket well healed; without hematoma or erythema.  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  We will decrease her amiodarone from 400--300 mg a day.  I broached the topic of catheter ablation; however, she is disinclined to undergo sleep study.  In this cohort, the benefits of catheter ablation has been so mitigated that I am not sure that it is worth pursuing.  We will revisit it again;  tolerating amiodarone.  Again reviewed side effects.  We have discussed the impact of salt and fluid on her diet.  For now we will increase her furosemide from 40--60 mg daily.  She has close follow-up with renal and they will hopefully continue to manage her fluids.  In this regard her GFR was a little bit better  Interval chest pain.  Has known coronary artery disease that was nonobstructive previously.  We will undertake CTA.

## 2020-11-22 ENCOUNTER — Telehealth (HOSPITAL_COMMUNITY): Payer: Self-pay | Admitting: *Deleted

## 2020-11-22 NOTE — Telephone Encounter (Signed)
Reaching out to patient to offer assistance regarding upcoming cardiac imaging study; pt verbalizes understanding of appt date/time, parking situation and where to check in, pre-test NPO status and medications ordered, and verified current allergies; name and call back number provided for further questions should they arise  Gordy Clement RN Navigator Cardiac Imaging [facility] [phone] office [phone] cell  Pt to take her amiodarone, metoprolol succinate, and metoprolol tartrate before cardiac CT scan.

## 2020-11-23 ENCOUNTER — Ambulatory Visit
Admission: RE | Admit: 2020-11-23 | Discharge: 2020-11-23 | Disposition: A | Discharge status: Home / Self Care | Payer: Medicare Other | Source: Ambulatory Visit | Attending: Internal Medicine | Admitting: Internal Medicine

## 2020-11-23 ENCOUNTER — Other Ambulatory Visit: Payer: Self-pay

## 2020-11-23 DIAGNOSIS — R072 Precordial pain: Secondary | ICD-10-CM | POA: Insufficient documentation

## 2020-11-23 LAB — POCT I-STAT CREATININE: Creatinine, Ser: 2.1 mg/dL — ABNORMAL HIGH (ref 0.44–1.00)

## 2020-11-23 MED ORDER — AMIODARONE HCL 200 MG PO TABS: ORAL_TABLET | ORAL | 1 refills | Status: DC

## 2020-11-23 NOTE — Telephone Encounter (Signed)
Rx request sent to pharmacy.  

## 2020-11-23 NOTE — Telephone Encounter (Signed)
*  STAT* If patient is at the pharmacy, call can be transferred to refill team.   1. Which medications need to be refilled? (please list name of each medication and dose if known) Amiodarone  2. Which pharmacy/location (including street and city if local pharmacy) is medication to be sent to? [facility]  3. Do they need a 30 day or 90 day supply? [place]

## 2020-11-28 ENCOUNTER — Telehealth: Payer: Self-pay | Admitting: Family Medicine

## 2020-11-28 ENCOUNTER — Telehealth: Payer: Self-pay | Admitting: Internal Medicine

## 2020-11-28 DIAGNOSIS — R252 Cramp and spasm: Secondary | ICD-10-CM

## 2020-11-28 MED ORDER — BACLOFEN 5 MG PO TABS: 5.0000 mg | ORAL_TABLET | Freq: Every day | ORAL | 3 refills | Status: DC

## 2020-11-28 NOTE — Telephone Encounter (Signed)
Please notify patient that her Baclofen was re ordered as requested.  Also please notify her that if her kidney function does decline in future we will need to switch this medication back to Flexeril can do low dose. But the Baclofen is not recommended with dramatically reduced kidney function.  She can contact us in future if need on this  [name], [facility] 11/28/2020, 12:03 PM

## 2020-11-28 NOTE — Telephone Encounter (Signed)
Medication Refill - Medication: Baclofen 5 MG TABS    Has the patient contacted their pharmacy? Yes.   (Agent: If no, request that the patient contact the pharmacy for the refill.) (Agent: If yes, when and what did the pharmacy advise?)  Preferred Pharmacy (with phone number or street name):  [facility], [employer] [place]  [address] [place] [address]  Phone: [phone] Fax: [phone]     Agent: Please be advised that RX refills may take up to 3 business days. We ask that you follow-up with your pharmacy.

## 2020-11-28 NOTE — Telephone Encounter (Signed)
Pt c/o medication issue:  1. Name of Medication: amiodarone   2. How are you currently taking this medication (dosage and times per day)? 1.5 tablets 300 mg  3. Are you having a reaction (difficulty breathing--STAT)?   4. What is your medication issue? clarification of medication change

## 2020-11-28 NOTE — Telephone Encounter (Signed)
Call transferred directly to this RN from scheduling. I spoke with Otila Kluver from [employer]. She advised she was needing to transfer me to a pharmacy team member to clarify the patient's amiodarone RX, but after several minutes she was unable to complete the transfer.  She requested I call back to [facility] at [phone]. Ref #- [id number].  Attempted to call [facility] back.   I was able to speak with a pharmacy technician. She advised the patient had requested a refill on the amiodarone and they needed to verify the RX. I advised we sent the updated RX change in to [facility] on 11/23/20 for: Amiodarone 200 mg- take 1.5 tablets (300 mg) by mouth once daily Pharmacy comments that this was a dose decrease (previously on amiodarone 200 mg- 1 tablet twice daily = a total of 400 mg a day).  The pharmacy asked if we were aware this was a dose decrease. I advised we were aware of this and that Dr. Caryl Comes had made this change.  RX read back by the pharmacy technician for verification.  I advised this was correct as we had previously sent in.  They will refill this for the patient's with the new directions.

## 2020-11-30 ENCOUNTER — Ambulatory Visit (INDEPENDENT_AMBULATORY_CARE_PROVIDER_SITE_OTHER): Payer: Medicare Other

## 2020-11-30 DIAGNOSIS — I442 Atrioventricular block, complete: Secondary | ICD-10-CM | POA: Diagnosis not present

## 2020-11-30 LAB — CUP PACEART REMOTE DEVICE CHECK
Battery Remaining Longevity: 16 mo
Battery Voltage: 2.9 V
Brady Statistic AP VP Percent: 99.98 %
Brady Statistic AP VS Percent: 0 %
Brady Statistic AS VP Percent: 0.02 %
Brady Statistic AS VS Percent: 0 %
Brady Statistic RA Percent Paced: 99.97 %
Brady Statistic RV Percent Paced: 100 %
Date Time Interrogation Session: 20220428101156
Implantable Lead Implant Date: 20141217
Implantable Lead Implant Date: 20141217
Implantable Lead Location: 753859
Implantable Lead Location: 753860
Implantable Lead Model: 5076
Implantable Lead Model: 5076
Implantable Pulse Generator Implant Date: 20141217
Lead Channel Impedance Value: 456 Ohm
Lead Channel Impedance Value: 456 Ohm
Lead Channel Impedance Value: 551 Ohm
Lead Channel Impedance Value: 551 Ohm
Lead Channel Pacing Threshold Amplitude: 0.375 V
Lead Channel Pacing Threshold Amplitude: 0.625 V
Lead Channel Pacing Threshold Pulse Width: 0.4 ms
Lead Channel Pacing Threshold Pulse Width: 0.4 ms
Lead Channel Sensing Intrinsic Amplitude: 16.875 mV
Lead Channel Sensing Intrinsic Amplitude: 16.875 mV
Lead Channel Sensing Intrinsic Amplitude: 5.125 mV
Lead Channel Sensing Intrinsic Amplitude: 5.125 mV
Lead Channel Setting Pacing Amplitude: 1.75 V
Lead Channel Setting Pacing Amplitude: 2.5 V
Lead Channel Setting Pacing Pulse Width: 0.4 ms
Lead Channel Setting Sensing Sensitivity: 0.9 mV

## 2020-12-01 ENCOUNTER — Telehealth: Payer: Self-pay | Admitting: Internal Medicine

## 2020-12-01 DIAGNOSIS — R072 Precordial pain: Secondary | ICD-10-CM

## 2020-12-01 MED ORDER — AMIODARONE HCL 200 MG PO TABS: ORAL_TABLET | ORAL | 1 refills | Status: DC

## 2020-12-01 NOTE — Telephone Encounter (Signed)
Call transferred directly to this nurse from scheduling.  Per the patient, she has not received the refill on her amiodarone from [facility]. I advised her we had sent this in several days ago, but [facility] called the office on 11/28/20 wanting to clarify the RX and make sure we were aware of the medication change.  She states she took her last 0.5 tablet (100 mg) of amiodarone this morning. I advised her I will send this to the local pharmacy for 10-day supply so she does not miss any of this medication.  The patient is agreeable.  She would like it to go to [facility].  I have asked her to touch base with the pharmacy this afternoon to make sure the RX is ready.  The patient also advised she was not able to have her Cardiac CTA performed last week as her renal function was too high. I advised her I was not aware of this as I was out of the office last week and had not received any notification on this.  She is aware I will review with Dr. Caryl Comes when he is back in the office week after next and call her back.   The patient voices understanding of the above and is agreeable.

## 2020-12-02 NOTE — Telephone Encounter (Signed)
GM2U  Thx for all the inform My note reads Amio 300 day(having increased following DCCV) so would refill here And with /cr > 2 we should try myoview to assess for progression of the the RCA or LAD lesion

## 2020-12-04 NOTE — Telephone Encounter (Signed)
Scheduled

## 2020-12-04 NOTE — Telephone Encounter (Signed)
[place]  Your caregiver has ordered a Stress Test with nuclear imaging. The purpose of this test is to evaluate the blood supply to your heart muscle. This procedure is referred to as a "Non-Invasive Stress Test." This is because other than having an IV started in your vein, nothing is inserted or "invades" your body. Cardiac stress tests are done to find areas of poor blood flow to the heart by determining the extent of coronary artery disease (CAD). Some patients exercise on a treadmill, which naturally increases the blood flow to your heart, while others who are  unable to walk on a treadmill due to physical limitations have a pharmacologic/chemical stress agent called Lexiscan . This medicine will mimic walking on a treadmill by temporarily increasing your coronary blood flow.   Please note: these test may take anywhere between 2-4 hours to complete  PLEASE REPORT TO [place] TO GO  Date of Procedure: Thursday 12/07/20  Arrival Time for Procedure: 0915  Instructions regarding medication:   __x__ : Hold diabetes medication morning of procedure (DULAGLUTIDE)  __x__:  Hold betablocker(s) night before procedure and morning of procedure (METOPROLOL)  __x__:  Hold LASIX the morning of your test  PLEASE NOTIFY THE OFFICE AT LEAST 24 HOURS IN ADVANCE IF YOU ARE UNABLE TO KEEP YOUR APPOINTMENT.  [phone] AND  PLEASE NOTIFY NUCLEAR MEDICINE AT [facility] AT LEAST 24 HOURS IN ADVANCE IF YOU ARE UNABLE TO KEEP YOUR APPOINTMENT. [phone]  How to prepare for your Myoview test:  1. Do not eat or drink after midnight 2. No caffeine for 24 hours prior to test 3. No smoking 24 hours prior to test. 4. Your medication may be taken with water.  If your doctor stopped a medication because of this test, do not take that medication. 5. Ladies, please do not wear dresses.  Skirts or pants are appropriate. Please wear a short  sleeve shirt. 6. No perfume, cologne or lotion. 7. Wear comfortable walking shoes. No heels!    I have called the patient and reviewed the instructions as stated above with her.  She voices understanding and is agreeable.

## 2020-12-04 NOTE — Telephone Encounter (Signed)
I called and spoke with the patient.  I have advised her of Dr. Olin Pia recommendations for a lexiscan myoview.  The patient is hesitant about having this as she had one in 2006 and didn't like how it made her feel, but this was prior to her PPM.  I have advised the patient with her being older, her target HR will be lower for the stress test, so maybe this will help some.  The patient advised she is willing to undergo a lexiscan myoview. I have discussed with her that I will send a message to scheduling and once I know the date of her test, I will call her back and go over all of her instructions with her.  The patient voices understanding and is agreeable.

## 2020-12-07 ENCOUNTER — Encounter
Admission: RE | Admit: 2020-12-07 | Discharge: 2020-12-07 | Disposition: A | Discharge status: Home / Self Care | Payer: Medicare Other | Source: Ambulatory Visit | Attending: Internal Medicine | Admitting: Internal Medicine

## 2020-12-07 ENCOUNTER — Other Ambulatory Visit: Payer: Self-pay

## 2020-12-07 DIAGNOSIS — R072 Precordial pain: Secondary | ICD-10-CM | POA: Insufficient documentation

## 2020-12-07 LAB — NM MYOCAR MULTI W/SPECT W/WALL MOTION / EF
LV dias vol: 52 mL (ref 46–106)
LV sys vol: 23 mL
Peak HR: 69 {beats}/min
Percent HR: 46 %
Rest HR: 63 {beats}/min
SDS: 1
SRS: 8
SSS: 6
TID: 0.78

## 2020-12-07 MED ORDER — TECHNETIUM TC 99M TETROFOSMIN IV KIT
10.0000 | PACK | Freq: Once | INTRAVENOUS | Status: AC | PRN
  Administered 2020-12-07: 10.1 via INTRAVENOUS

## 2020-12-07 MED ORDER — REGADENOSON 0.4 MG/5ML IV SOLN
0.4000 mg | Freq: Once | INTRAVENOUS | Status: AC
  Administered 2020-12-07: 0.4 mg via INTRAVENOUS
  Filled 2020-12-07: qty 5

## 2020-12-07 MED ORDER — TECHNETIUM TC 99M TETROFOSMIN IV KIT
30.0000 | PACK | Freq: Once | INTRAVENOUS | Status: AC | PRN
  Administered 2020-12-07: 32.53 via INTRAVENOUS

## 2020-12-18 ENCOUNTER — Telehealth: Payer: Self-pay | Admitting: Internal Medicine

## 2020-12-18 NOTE — Telephone Encounter (Signed)
error 

## 2020-12-19 ENCOUNTER — Telehealth: Payer: Self-pay | Admitting: Internal Medicine

## 2020-12-19 NOTE — Telephone Encounter (Signed)
Attempted to call the patient with her myoview results. No answer- no voice mail set up.  Will call back at a later time.

## 2020-12-20 DIAGNOSIS — H9202 Otalgia, left ear: Secondary | ICD-10-CM | POA: Diagnosis not present

## 2020-12-20 DIAGNOSIS — M26642 Arthritis of left temporomandibular joint: Secondary | ICD-10-CM | POA: Diagnosis not present

## 2020-12-20 NOTE — Progress Notes (Signed)
Remote pacemaker transmission.   

## 2020-12-21 ENCOUNTER — Encounter: Payer: Self-pay | Admitting: Family Medicine

## 2020-12-21 ENCOUNTER — Ambulatory Visit (INDEPENDENT_AMBULATORY_CARE_PROVIDER_SITE_OTHER): Payer: Medicare Other | Admitting: Family Medicine

## 2020-12-21 ENCOUNTER — Other Ambulatory Visit: Payer: Self-pay

## 2020-12-21 VITALS — BP 142/56 | HR 69 | Ht 59.0 in | Wt 170.0 lb

## 2020-12-21 DIAGNOSIS — E785 Hyperlipidemia, unspecified: Secondary | ICD-10-CM

## 2020-12-21 DIAGNOSIS — I442 Atrioventricular block, complete: Secondary | ICD-10-CM | POA: Diagnosis not present

## 2020-12-21 DIAGNOSIS — I4819 Other persistent atrial fibrillation: Secondary | ICD-10-CM | POA: Diagnosis not present

## 2020-12-21 DIAGNOSIS — D649 Anemia, unspecified: Secondary | ICD-10-CM | POA: Diagnosis not present

## 2020-12-21 DIAGNOSIS — E1169 Type 2 diabetes mellitus with other specified complication: Secondary | ICD-10-CM | POA: Diagnosis not present

## 2020-12-21 DIAGNOSIS — N184 Chronic kidney disease, stage 4 (severe): Secondary | ICD-10-CM | POA: Diagnosis not present

## 2020-12-21 DIAGNOSIS — F5101 Primary insomnia: Secondary | ICD-10-CM

## 2020-12-21 DIAGNOSIS — E039 Hypothyroidism, unspecified: Secondary | ICD-10-CM

## 2020-12-21 DIAGNOSIS — I129 Hypertensive chronic kidney disease with stage 1 through stage 4 chronic kidney disease, or unspecified chronic kidney disease: Secondary | ICD-10-CM

## 2020-12-21 NOTE — Assessment & Plan Note (Signed)
Controlled on Temazepam PRN, continue rx  Failed Ambien in past

## 2020-12-21 NOTE — Progress Notes (Signed)
Subjective:    Patient ID: Renee Boyd, female    DOB: Jul 31, 1951, 70 y.o.   MRN: [id number]  Renee Boyd is a 70 y.o. female presenting on 12/21/2020 for Diabetes, Hypertension, and Lip Laceration   HPI   Specialists: Endocrinology - Malissa Hippo NP [facility] Endocr[facility]ology) Nephrology - Dr Lavonia Dana [facility]) Cardiology - [facility] [place] / EP  Here for 6 month follow-up   CKD-IV with hyperkalemia and subnephrotic proteinuria HTN Secondary Anemia of chronic kidney disease  Secondary to DM HTN Followed by Dr Juleen China, last seen 10/2020 Kidney function reduced some then she did some classes on CKD and kidney function has improved. On Lisinopril for renal protection On Furosemide, ACEi, Metoprolol, Amlodipine Off sodium bicarbonate  Type 2 Diabetes Followed by [facility]ocrinology currently due to advancing renal disease. Remains discontinued on Metformin due to renal function. She should avoid NSAIDs. But continue Lisinopril ACEi. Last lab A1c 7.6% (10/11/20) She uses Contour Next EZ Glucomter On Trulicity 1.5 mg weekly Off Metformin due to CKD Off Rybelsus due to nausea  Osteoarthritis, multiple joints / Back Pain / Hip Pain / Shoulder pain Chronic problem. Episodic painful flares of joint pain, worse with inc activity and storms and weather changes Previously managed on Tramadol PRN for pain and this was renally adjusted to max of 50mg  BID but then eventually taken off of this  Cannot take NSAIDs due to CKD. On Gabapentin 300mg  daily doing well. Using baclofen 5mg  only max dose nightly  Persistent Atrial Fibrillation, s/p Cardioversion On Anticoagulation with Eliquis Complete Heart Block Precordial Chest Pain Followed by Dr Virl Axe She had Cardioversion in March 2022, unsuccessful. Last test 12/07/20 nuclear stress test. She has had cardiac stress test, waiting on result. She tried to call them back after they called on 12/19/20.  Chronic  Insomnia Primary problem difficulty sleeping. She takes Temazepam nightly PRN. Will need refill in future.  ENT Cerumen impaction and she was using topical menthol and it built up in ear as well. They flushed their ear with improvement. Also some TMJ pain.  Additionally  - Fall while walking into building, accidental trip, busted left lip, some bleeding now improved   Depression screen [facility] 2/9 12/21/2020 06/27/2020 03/14/2020  Decreased Interest 0 0 0  Down, Depressed, Hopeless 0 0 0  PHQ - 2 Score 0 0 0  Altered sleeping 0 - -  Tired, decreased energy 0 - -  Change in appetite 0 - -  Feeling bad or failure about yourself  0 - -  Trouble concentrating 0 - -  Moving slowly or fidgety/restless 0 - -  Suicidal thoughts 0 - -  PHQ-9 Score 0 - -  Difficult doing work/chores Not difficult at all - -  Some recent data might be hidden    Social History   Tobacco Use  . Smoking status: Former Smoker    Packs/day: 2.00    Years: 40.00    Pack years: 80.00    Types: Cigarettes    Quit date: 06/27/2005    Years since quitting: 15.4  . Smokeless tobacco: Former Systems developer  . Tobacco comment: quit Jun 27 2005  Vaping Use  . Vaping Use: Never used  Substance Use Topics  . Alcohol use: No  . Drug use: No    Review of Systems Per HPI unless specifically indicated above     Objective:    BP (!) 142/56   Pulse 69   Ht 4\' 11"  (1.499 m)  Wt 170 lb (77.1 kg)   SpO2 100%   BMI 34.34 kg/m   Wt Readings from Last 3 Encounters:  12/21/20 170 lb (77.1 kg)  11/16/20 169 lb (76.7 kg)  10/25/20 168 lb (76.2 kg)    Physical Exam Vitals and nursing note reviewed.  Constitutional:      General: She is not in acute distress.    Appearance: She is well-developed. She is not diaphoretic.     Comments: Well-appearing, comfortable, cooperative  HENT:     Head: Normocephalic and atraumatic.  Eyes:     General:        Right eye: No discharge.        Left eye: No discharge.      Conjunctiva/sclera: Conjunctivae normal.  Neck:     Thyroid: No thyromegaly.  Cardiovascular:     Rate and Rhythm: Normal rate and regular rhythm.     Heart sounds: Normal heart sounds. No murmur heard.   Pulmonary:     Effort: Pulmonary effort is normal. No respiratory distress.     Breath sounds: Normal breath sounds. No wheezing or rales.  Musculoskeletal:        General: Normal range of motion.     Cervical back: Normal range of motion and neck supple.  Lymphadenopathy:     Cervical: No cervical adenopathy.  Skin:    General: Skin is warm and dry.     Findings: No erythema or rash.     Comments: Left lip contusion from fall today, slightly swollen, bleeding has stopped from superficial abrasion. No other injury or ecchymosis  Neurological:     Mental Status: She is alert and oriented to person, place, and time.  Psychiatric:        Behavior: Behavior normal.     Comments: Well groomed, good eye contact, normal speech and thoughts       Results for orders placed or performed in visit on 12/21/20  Hemoglobin A1c  Result Value Ref Range   Hemoglobin A1C 7.6       Assessment & Plan:   Problem List Items Addressed This Visit    Type 2 diabetes mellitus with hyperlipidemia ([place])    Followed by [facility] Endocrinology currently  Stable A1c 7.6 Within range of 7-8 No hypoglycemia Complications - CKD-IV, other including, GERD, obesity, hypothyroidism, Gout - increases risk of future cardiovascular complications  - OFF Sulfonylurea, Metformin due to CKD, Rybelsus nausea  Followed by Dr Juleen China [facility] - cannot resume Metformin due to GFR  Plan Continue with current plan per Endocrine - Trulicity 1.5mg  weekly. Lifestyle recommendations      Primary insomnia    Controlled on Temazepam PRN, continue rx  Failed Ambien in past      Persistent atrial fibrillation (HCC)    S/p Cardioversion On anticoagulation Eliquis      Normocytic anemia   Hypothyroidism     Controlled Last TSH normal On levothyroxine 66mcg daily - continue      Complete heart block (HCC)    Stable chronic problem s/p pacemaker Followed by Cardiology Failed Cardioversion for this. Had nuclear stress      CKD (chronic kidney disease), stage IV (HCC)    Stable CKD-IV per Dr Juleen China [facility] Reviewed report Secondary to DM, HTN, Age Cannot use Metformin due to GFR - on GLP1 Trulicity per Endocrine      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) - Primary    Controlled HTN Complication with CKD-IV, CHB  pacer, AFib Followed by Cardiology, Nephrology    Plan:  1. Continue current BP regimen - Metoprolol-XL 100mg  daily, Amlodipine 10mg , Lisinopril 20mg  daily - Furosemide 40-60mg  daily (x2-3 of 20mg  tabs) per Cardiology 2. Encourage improved lifestyle - low sodium diet, regular exercise 3. Continue monitor BP outside office, bring readings to next visit, if persistently >140/90 or new symptoms notify office sooner           No orders of the defined types were placed in this encounter.    Follow up plan: Return in about 6 months (around 06/23/2021) for 6 month Yearly Medicare Checkup (can do labs in AM if needed after visit).    [name], [facility] 12/21/2020, 10:58 AM

## 2020-12-21 NOTE — Assessment & Plan Note (Signed)
Followed by [facility] Endocrinology currently  Stable A1c 7.6 Within range of 7-8 No hypoglycemia Complications - CKD-IV, other including, GERD, obesity, hypothyroidism, Gout - increases risk of future cardiovascular complications  - OFF Sulfonylurea, Metformin due to CKD, Rybelsus nausea  Followed by Dr Juleen China [facility] - cannot resume Metformin due to GFR  Plan Continue with current plan per Endocrine - Trulicity 1.5mg  weekly. Lifestyle recommendations

## 2020-12-21 NOTE — Assessment & Plan Note (Signed)
Controlled HTN Complication with CKD-IV, CHB pacer, AFib Followed by Cardiology, Nephrology    Plan:  1. Continue current BP regimen - Metoprolol-XL 100mg  daily, Amlodipine 10mg , Lisinopril 20mg  daily - Furosemide 40-60mg  daily (x2-3 of 20mg  tabs) per Cardiology 2. Encourage improved lifestyle - low sodium diet, regular exercise 3. Continue monitor BP outside office, bring readings to next visit, if persistently >140/90 or new symptoms notify office sooner

## 2020-12-21 NOTE — Assessment & Plan Note (Signed)
Controlled Last TSH normal On levothyroxine 23mcg daily - continue

## 2020-12-21 NOTE — Assessment & Plan Note (Signed)
S/p Cardioversion On anticoagulation Eliquis

## 2020-12-21 NOTE — Assessment & Plan Note (Signed)
Stable chronic problem s/p pacemaker Followed by Cardiology Failed Cardioversion for this. Had nuclear stress

## 2020-12-21 NOTE — Telephone Encounter (Signed)
I spoke with the patient regarding her normal lexiscan results.  The patient voices understanding of these results and was appreciative of the call back.

## 2020-12-21 NOTE — Telephone Encounter (Signed)
Patient calling to discuss results.  

## 2020-12-21 NOTE — Patient Instructions (Addendum)
Thank you for coming to the office today.  Keep up the good work.  Let me know if need any additional refills.   Please schedule a Follow-up Appointment to: Return in about 6 months (around 06/23/2021) for 6 month Yearly Medicare Checkup (can do labs in AM if needed after visit).  If you have any other questions or concerns, please feel free to call the office or send a message through [place]. You may also schedule an earlier appointment if necessary.  Additionally, you may be receiving a survey about your experience at our office within a few days to 1 week by e-mail or mail. We value your feedback.  [name], DO [place]

## 2020-12-21 NOTE — Assessment & Plan Note (Signed)
Stable CKD-IV per Dr Juleen China [facility] Reviewed report Secondary to DM, HTN, Age Cannot use Metformin due to GFR - on GLP1 Trulicity per Endocrine

## 2020-12-28 ENCOUNTER — Ambulatory Visit: Payer: Medicare Other | Admitting: Family Medicine

## 2021-01-08 ENCOUNTER — Other Ambulatory Visit: Payer: Self-pay

## 2021-01-08 DIAGNOSIS — E039 Hypothyroidism, unspecified: Secondary | ICD-10-CM

## 2021-01-08 MED ORDER — LEVOTHYROXINE SODIUM 25 MCG PO TABS: 25.0000 ug | ORAL_TABLET | Freq: Every day | ORAL | 0 refills | Status: DC

## 2021-01-10 DIAGNOSIS — N2581 Secondary hyperparathyroidism of renal origin: Secondary | ICD-10-CM | POA: Diagnosis not present

## 2021-01-10 DIAGNOSIS — I129 Hypertensive chronic kidney disease with stage 1 through stage 4 chronic kidney disease, or unspecified chronic kidney disease: Secondary | ICD-10-CM | POA: Diagnosis not present

## 2021-01-10 DIAGNOSIS — E1122 Type 2 diabetes mellitus with diabetic chronic kidney disease: Secondary | ICD-10-CM | POA: Diagnosis not present

## 2021-01-10 DIAGNOSIS — E872 Acidosis: Secondary | ICD-10-CM | POA: Diagnosis not present

## 2021-01-10 DIAGNOSIS — E875 Hyperkalemia: Secondary | ICD-10-CM | POA: Diagnosis not present

## 2021-01-10 DIAGNOSIS — N184 Chronic kidney disease, stage 4 (severe): Secondary | ICD-10-CM | POA: Diagnosis not present

## 2021-01-10 DIAGNOSIS — R809 Proteinuria, unspecified: Secondary | ICD-10-CM | POA: Diagnosis not present

## 2021-01-12 ENCOUNTER — Other Ambulatory Visit: Payer: Self-pay | Admitting: Family Medicine

## 2021-01-12 DIAGNOSIS — E785 Hyperlipidemia, unspecified: Secondary | ICD-10-CM

## 2021-01-12 NOTE — Telephone Encounter (Signed)
  Notes to clinic:  Patient states that script was refused and she is not sure why I don't see a request for this script Review for refill  Requested Prescriptions  Pending Prescriptions Disp Refills   CONTOUR NEXT TEST test strip 200 each 5    Sig: Use to check blood sugar up to twice a day.      Endocrinology: Diabetes - Testing Supplies Passed - 01/12/2021  1:50 PM      Passed - Valid encounter within last 12 months    Recent Outpatient Visits           3 weeks ago Benign hypertension with CKD (chronic kidney disease) stage IV [facility])   [facility], Devonne Doughty, DO   6 months ago Benign hypertension with CKD (chronic kidney disease) stage IV [facility])   [place], DO   1 year ago Primary osteoarthritis involving multiple joints   [place], DO   1 year ago Acute respiratory failure with hypoxia [facility])   [place], DO   1 year ago Type 2 diabetes with nephropathy [facility])   [facility] Olin Hauser, DO       Future Appointments             In 5 months [place], Devonne Doughty, DO [facility], [facility]

## 2021-01-12 NOTE — Telephone Encounter (Signed)
Copied from [place] [id number]. Topic: Quick Communication - Rx Refill/Question >> Jan 12, 2021  1:43 PM Mcneil, Ja-Kwan wrote: Medication: CONTOUR NEXT TEST test strip  Has the patient contacted their pharmacy? Yes.  - Pt stated she was advised the Rx was denied and to contact provider  Preferred Pharmacy (with phone number or street name): [place], [place] Phone: [phone]   Fax: [phone]  Agent: Please be advised that RX refills may take up to 3 business days. We ask that you follow-up with your pharmacy.

## 2021-01-15 MED ORDER — CONTOUR NEXT TEST VI STRP: ORAL_STRIP | 5 refills | Status: AC

## 2021-01-17 DIAGNOSIS — E875 Hyperkalemia: Secondary | ICD-10-CM | POA: Diagnosis not present

## 2021-01-17 DIAGNOSIS — R809 Proteinuria, unspecified: Secondary | ICD-10-CM | POA: Diagnosis not present

## 2021-01-17 DIAGNOSIS — E1122 Type 2 diabetes mellitus with diabetic chronic kidney disease: Secondary | ICD-10-CM | POA: Diagnosis not present

## 2021-01-17 DIAGNOSIS — N184 Chronic kidney disease, stage 4 (severe): Secondary | ICD-10-CM | POA: Diagnosis not present

## 2021-01-17 DIAGNOSIS — N2581 Secondary hyperparathyroidism of renal origin: Secondary | ICD-10-CM | POA: Diagnosis not present

## 2021-01-17 DIAGNOSIS — I129 Hypertensive chronic kidney disease with stage 1 through stage 4 chronic kidney disease, or unspecified chronic kidney disease: Secondary | ICD-10-CM | POA: Diagnosis not present

## 2021-01-29 DIAGNOSIS — H40003 Preglaucoma, unspecified, bilateral: Secondary | ICD-10-CM | POA: Diagnosis not present

## 2021-01-30 DIAGNOSIS — H40003 Preglaucoma, unspecified, bilateral: Secondary | ICD-10-CM | POA: Diagnosis not present

## 2021-01-30 LAB — HM DIABETES EYE EXAM

## 2021-01-31 DIAGNOSIS — E1159 Type 2 diabetes mellitus with other circulatory complications: Secondary | ICD-10-CM | POA: Diagnosis not present

## 2021-02-07 DIAGNOSIS — E1159 Type 2 diabetes mellitus with other circulatory complications: Secondary | ICD-10-CM | POA: Diagnosis not present

## 2021-02-07 DIAGNOSIS — N1832 Chronic kidney disease, stage 3b: Secondary | ICD-10-CM | POA: Diagnosis not present

## 2021-02-07 DIAGNOSIS — E1122 Type 2 diabetes mellitus with diabetic chronic kidney disease: Secondary | ICD-10-CM | POA: Diagnosis not present

## 2021-02-12 ENCOUNTER — Other Ambulatory Visit: Payer: Self-pay | Admitting: Family Medicine

## 2021-02-12 DIAGNOSIS — F5101 Primary insomnia: Secondary | ICD-10-CM

## 2021-02-12 NOTE — Telephone Encounter (Signed)
Requested medication (s) are due for refill today: Yes  Requested medication (s) are on the active medication list: Yes  Last refill:  08/14/20  Future visit scheduled: Yes  Notes to clinic:  See request.    Requested Prescriptions  Pending Prescriptions Disp Refills   temazepam (RESTORIL) 30 MG capsule [Pharmacy Med Name: TEMAZEPAM 30 MG CAP] 30 capsule     Sig: TAKE 1 CAPSULE BY MOUTH AT BEDTIME AS NEEDED SLEEP      Not Delegated - Psychiatry:  Anxiolytics/Hypnotics Failed - 02/12/2021  9:52 AM      Failed - This refill cannot be delegated      Failed - Urine Drug Screen completed in last 360 days      Passed - Valid encounter within last 6 months    Recent Outpatient Visits           1 month ago Benign hypertension with CKD (chronic kidney disease) stage IV [facility])   [place], DO   7 months ago Benign hypertension with CKD (chronic kidney disease) stage IV [facility])   [place], DO   1 year ago Primary osteoarthritis involving multiple joints   [place], DO   1 year ago Acute respiratory failure with hypoxia [facility])   [facility] Olin Hauser, DO   1 year ago Type 2 diabetes with nephropathy [facility])   [facility] [place], Devonne Doughty, DO       Future Appointments             In 4 months [place], Devonne Doughty, DO [facility], [facility]

## 2021-02-26 ENCOUNTER — Other Ambulatory Visit: Payer: Self-pay | Admitting: Internal Medicine

## 2021-03-01 ENCOUNTER — Ambulatory Visit (INDEPENDENT_AMBULATORY_CARE_PROVIDER_SITE_OTHER): Payer: Medicare Other

## 2021-03-01 DIAGNOSIS — I442 Atrioventricular block, complete: Secondary | ICD-10-CM

## 2021-03-01 LAB — CUP PACEART REMOTE DEVICE CHECK
Battery Remaining Longevity: 13 mo
Battery Voltage: 2.89 V
Brady Statistic AP VP Percent: 99.76 %
Brady Statistic AP VS Percent: 0 %
Brady Statistic AS VP Percent: 0.24 %
Brady Statistic AS VS Percent: 0 %
Brady Statistic RA Percent Paced: 99.69 %
Brady Statistic RV Percent Paced: 100 %
Date Time Interrogation Session: 20220728092039
Implantable Lead Implant Date: 20141217
Implantable Lead Implant Date: 20141217
Implantable Lead Location: 753859
Implantable Lead Location: 753860
Implantable Lead Model: 5076
Implantable Lead Model: 5076
Implantable Pulse Generator Implant Date: 20141217
Lead Channel Impedance Value: 418 Ohm
Lead Channel Impedance Value: 437 Ohm
Lead Channel Impedance Value: 475 Ohm
Lead Channel Impedance Value: 494 Ohm
Lead Channel Pacing Threshold Amplitude: 0.375 V
Lead Channel Pacing Threshold Amplitude: 0.875 V
Lead Channel Pacing Threshold Pulse Width: 0.4 ms
Lead Channel Pacing Threshold Pulse Width: 0.4 ms
Lead Channel Sensing Intrinsic Amplitude: 16.875 mV
Lead Channel Sensing Intrinsic Amplitude: 16.875 mV
Lead Channel Sensing Intrinsic Amplitude: 4.125 mV
Lead Channel Sensing Intrinsic Amplitude: 4.125 mV
Lead Channel Setting Pacing Amplitude: 1.75 V
Lead Channel Setting Pacing Amplitude: 2.5 V
Lead Channel Setting Pacing Pulse Width: 0.4 ms
Lead Channel Setting Sensing Sensitivity: 0.9 mV

## 2021-03-08 ENCOUNTER — Other Ambulatory Visit: Payer: Self-pay | Admitting: Family Medicine

## 2021-03-08 DIAGNOSIS — R252 Cramp and spasm: Secondary | ICD-10-CM

## 2021-03-08 MED ORDER — BACLOFEN 5 MG PO TABS: 5.0000 mg | ORAL_TABLET | Freq: Every day | ORAL | 3 refills | Status: DC

## 2021-03-08 NOTE — Telephone Encounter (Signed)
Copied from [place] [id number]. Topic: Quick Communication - Rx Refill/Question >> Mar 08, 2021  9:32 AM Leward Quan A wrote: Medication: Baclofen 5 MG TABS Per patient refills are not automatic with this medication  Has the patient contacted their pharmacy? Yes.   (Agent: If no, request that the patient contact the pharmacy for the refill.) (Agent: If yes, when and what did the pharmacy advise?)  Preferred Pharmacy (with phone number or street name): [facility] - [address], [place]  Phone:  [phone] Fax:  [phone]     Agent: Please be advised that RX refills may take up to 3 business days. We ask that you follow-up with your pharmacy.

## 2021-03-15 ENCOUNTER — Other Ambulatory Visit: Payer: Self-pay | Admitting: Internal Medicine

## 2021-03-20 ENCOUNTER — Ambulatory Visit (INDEPENDENT_AMBULATORY_CARE_PROVIDER_SITE_OTHER): Payer: Medicare Other

## 2021-03-20 VITALS — Ht 59.0 in | Wt 170.0 lb

## 2021-03-20 DIAGNOSIS — Z0000 Encounter for general adult medical examination without abnormal findings: Secondary | ICD-10-CM | POA: Diagnosis not present

## 2021-03-20 NOTE — Progress Notes (Signed)
I connected with Jalysa Swopes today by telephone and verified that I am speaking with the correct person using two identifiers. Location patient: home Location provider: work Persons participating in the virtual visit: Jill, [place] LPN.   I discussed the limitations, risks, security and privacy concerns of performing an evaluation and management service by telephone and the availability of in person appointments. I also discussed with the patient that there may be a patient responsible charge related to this service. The patient expressed understanding and verbally consented to this telephonic visit.    Interactive audio and video telecommunications were attempted between this provider and patient, however failed, due to patient having technical difficulties OR patient did not have access to video capability.  We continued and completed visit with audio only.     Vital signs may be patient reported or missing.  Subjective:   ALMER LITTLETON is a 70 y.o. female who presents for Medicare Annual (Subsequent) preventive examination.  Review of Systems     Cardiac Risk Factors include: advanced age (>54men, >26 women);diabetes mellitus;dyslipidemia;hypertension;obesity (BMI >30kg/m2);sedentary lifestyle     Objective:    Today's Vitals   03/20/21 1058 03/20/21 1059  Weight: 170 lb (77.1 kg)   Height: 4\' 11"  (1.499 m)   PainSc:  6    Body mass index is 34.34 kg/m.  Advanced Directives 03/20/2021 10/25/2020 03/14/2020 08/11/2019 08/10/2019 02/09/2019 05/28/2018  Does Patient Have a Medical Advance Directive? Yes Yes Yes Yes No Yes Yes  Type of Paramedic of [place];Living will Living will [place];Living will Healthcare Power of Attorney - Living will;Healthcare Power of [place];Living will  Does patient want to make changes to medical advance directive? - - - No - Patient declined - - No - Patient declined   Copy of [place] in Chart? Yes - validated most recent copy scanned in chart (See row information) - Yes - validated most recent copy scanned in chart (See row information) - - Yes - validated most recent copy scanned in chart (See row information) No - copy requested  Would patient like information on creating a medical advance directive? - - - - - - -    Current Medications (verified) Outpatient Encounter Medications as of 03/20/2021  Medication Sig   allopurinol (ZYLOPRIM) 100 MG tablet Take 100 mg by mouth daily.   amiodarone (PACERONE) 200 MG tablet Take 1.5 tablets (300 mg) by mouth once daily   amLODipine (NORVASC) 10 MG tablet Take 10 mg by mouth daily.   ascorbic acid (VITAMIN C) 1000 MG tablet Take 1,000 mg by mouth daily.   atorvastatin (LIPITOR) 20 MG tablet TAKE 1 TABLET DAILY   Baclofen 5 MG TABS Take 5 mg by mouth at bedtime.   COMBIVENT RESPIMAT 20-100 MCG/ACT AERS respimat INHALE 1 PUFF EVERY 6 HOURS AS NEEDED WHEEZING/ SHORTNESS OF BREATH (Patient taking differently: Inhale 1 puff into the lungs every 6 (six) hours as needed for wheezing or shortness of breath.)   CONTOUR NEXT TEST test strip Use to check blood sugar up to twice a day.   desonide (DESOWEN) 0.05 % lotion Apply topically 2 (two) times daily. (Patient taking differently: Apply 1 application topically 2 (two) times daily as needed (irritation).)   Dulaglutide 1.5 MG/0.5ML SOPN Inject 1.5 mg into the skin every Tuesday.   ELIQUIS 5 MG TABS tablet TAKE 1 TABLET TWICE A DAY (Patient taking differently: Take 5 mg by mouth 2 (  two) times daily.)   furosemide (LASIX) 20 MG tablet Take 3 tablets (60 mg) by mouth once daily   levothyroxine (SYNTHROID) 25 MCG tablet Take 1 tablet (25 mcg total) by mouth daily before breakfast.   lisinopril (PRINIVIL,ZESTRIL) 20 MG tablet Take 1 tablet (20 mg total) by mouth daily.   metoprolol succinate (TOPROL-XL) 100 MG 24 hr tablet TAKE 1 TABLET DAILY WITH OR  IMMEDIATELY FOLLOWING A MEAL   metoprolol tartrate (LOPRESSOR) 100 MG tablet Take 1 tablet (100 mg) by mouth 2 hours prior to your Cardiac CT   Multiple Vitamin (MULTIVITAMIN) capsule Take 1 capsule by mouth daily.   omeprazole (PRILOSEC) 20 MG capsule Take 1 capsule (20 mg total) by mouth 2 (two) times daily before a meal.   temazepam (RESTORIL) 30 MG capsule Take 1 capsule (30 mg total) by mouth at bedtime as needed for sleep.   acetaminophen (TYLENOL) 650 MG CR tablet Take 1,300 mg by mouth every 8 (eight) hours as needed for pain. (Patient not taking: Reported on 03/20/2021)   COVID-19 mRNA vaccine, Pfizer, 30 MCG/0.3ML injection USE AS DIRECTED   No facility-administered encounter medications on file as of 03/20/2021.    Allergies (verified) Morphine and related, Neosporin [bacitracin-polymyxin b], Other, and Percocet [oxycodone-acetaminophen]   History: Past Medical History:  Diagnosis Date   Complete heart block ([place])    a. s/p MDT PPM 2006 with device generator replacement 2014; b. followed by Dr. Caryl Comes   Diabetes [facility])    GERD (gastroesophageal reflux disease)    Hypertension    Hypothyroid    Pacemaker    Persistent atrial fibrillation ([place])    a. on Eliquis; b. CHADS2VASc 6 (HTN. age x 1, DM, TIA x 2, female); c. s/p DCCV 05/28/18   Pulmonary hypertension ([place])    a. echo 2017: EF of 60-65%, no RWMA, mildly dilated left atrium, RVSF normal, PASP 64 mmHg   TIA (transient ischemic attack)    Past Surgical History:  Procedure Laterality Date   ABDOMINAL HYSTERECTOMY     CARDIOVERSION N/A 05/28/2018   Procedure: CARDIOVERSION;  Surgeon: Minna Merritts, MD;  Location: [facility] ORS;  Service: Cardiovascular;  Laterality: N/A;   CARDIOVERSION N/A 03/01/2020   Procedure: CARDIOVERSION;  Surgeon: Kate Sable, MD;  Location: [facility] ORS;  Service: Cardiovascular;  Laterality: N/A;   CARDIOVERSION N/A 10/25/2020   Procedure: CARDIOVERSION;  Surgeon: Kate Sable, MD;   Location: [facility] ORS;  Service: Cardiovascular;  Laterality: N/A;   CHOLECYSTECTOMY     TONSILLECTOMY     TUBAL LIGATION     Family History  Problem Relation Age of Onset   Hypertension Other    Heart disease Mother        MI   Alcohol abuse Father    Autism Son    Heart disease Maternal Grandmother    Cancer Son        colon   Miscarriages / Stillbirths Maternal Aunt    Social History   Socioeconomic History   Marital status: Divorced    Spouse name: Not on file   Number of children: 3   Years of education: Not on file   Highest education level: High school graduate  Occupational History   Occupation: retired  Tobacco Use   Smoking status: Former    Packs/day: 2.00    Years: 40.00    Pack years: 80.00    Types: Cigarettes    Quit date: 06/27/2005    Years since quitting: 15.7   Smokeless tobacco:  Former   Tobacco comments:    quit Jun 27 2005  Vaping Use   Vaping Use: Never used  Substance and Sexual Activity   Alcohol use: No   Drug use: No   Sexual activity: Not Currently  Other Topics Concern   Not on file  Social History Narrative   Lives by self.   Social Determinants of Health   Financial Resource Strain: Low Risk    Difficulty of Paying Living Expenses: Not hard at all  Food Insecurity: No Food Insecurity   Worried About Charity fundraiser in the Last Year: Never true   [place] in the Last Year: Never true  Transportation Needs: No Transportation Needs   Lack of Transportation (Medical): No   Lack of Transportation (Non-Medical): No  Physical Activity: Inactive   Days of Exercise per Week: 0 days   Minutes of Exercise per Session: 0 min  Stress: No Stress Concern Present   Feeling of Stress : Not at all  Social Connections: Not on file    Tobacco Counseling Counseling given: Not Answered Tobacco comments: quit Jun 27 2005   Clinical Intake:  Pre-visit preparation completed: Yes  Pain : 0-10 Pain Score: 6  Pain Type: Chronic  pain Pain Location: Back Pain Orientation: Lower Pain Descriptors / Indicators: Aching Pain Onset: More than a month ago Pain Frequency: Intermittent     Nutritional Status: BMI > 30  Obese Nutritional Risks: None Diabetes: Yes  How often do you need to have someone help you when you read instructions, pamphlets, or other written materials from your doctor or pharmacy?: 1 - Never What is the last grade level you completed in school?: some college  Diabetic? Yes Nutrition Risk Assessment:  Has the patient had any N/V/D within the last 2 months?  No  Does the patient have any non-healing wounds?  No  Has the patient had any unintentional weight loss or weight gain?  No   Diabetes:  Is the patient diabetic?  Yes  If diabetic, was a CBG obtained today?  No  Did the patient bring in their glucometer from home?  No  How often do you monitor your CBG's? daily.   Financial Strains and Diabetes Management:  Are you having any financial strains with the device, your supplies or your medication? No .  Does the patient want to be seen by Chronic Care Management for management of their diabetes?  No  Would the patient like to be referred to a Nutritionist or for Diabetic Management?  No   Diabetic Exams:  Diabetic Eye Exam: Completed 01/30/2021 Diabetic Foot Exam: Completed 06/27/2020   Interpreter Needed?: No  Information entered by :: NAllen LPN   Activities of Daily Living In your present state of health, do you have any difficulty performing the following activities: 03/20/2021 10/25/2020  Hearing? N N  Vision? N N  Difficulty concentrating or making decisions? N N  Walking or climbing stairs? N N  Dressing or bathing? N N  Doing errands, shopping? N -  Preparing Food and eating ? N -  Using the Toilet? N -  In the past six months, have you accidently leaked urine? N -  Do you have problems with loss of bowel control? N -  Managing your Medications? N -  Managing your  Finances? N -  Housekeeping or managing your Housekeeping? N -  Some recent data might be hidden    Patient Care Team: Olin Hauser,  DO as PCP - General (Family Medicine) Lavonia Dana, MD as Consulting Physician (Nephrology) Deboraha Sprang, MD as Consulting Physician (Cardiology) Edrick Kins, DPM as Consulting Physician (Podiatry) Vanita Ingles, RN as Case Manager (General Practice)  Indicate any recent Medical Services you may have received from other than [facility] providers in the past year (date may be approximate).     Assessment:   This is a routine wellness examination for [place].  Hearing/Vision screen Vision Screening - Comments:: Regular eye exams, [facility]  Dietary issues and exercise activities discussed: Current Exercise Habits: The patient does not participate in regular exercise at present   Goals Addressed             This Visit's Progress    Patient Stated       03/20/2021, no goals       Depression Screen PHQ 2/9 Scores 03/20/2021 12/21/2020 06/27/2020 03/14/2020 12/14/2019 02/09/2019 01/28/2019  PHQ - 2 Score 0 0 0 0 0 1 1  PHQ- 9 Score - 0 - - - - 2    Fall Risk Fall Risk  03/20/2021 12/21/2020 06/27/2020 03/14/2020 12/14/2019  Falls in the past year? 1 1 0 1 0  Comment tripped over sidewalk - - step off a step, stepped wrong -  Number falls in past yr: 0 0 0 1 0  Comment - - - - -  Injury with Fall? 0 1 0 0 0  Comment - Fell coming into the office, tripped on the curb. Busted her lip. We cleaned it and placed ice on it - - -  Risk for fall due to : Medication side effect - - Impaired balance/gait;Medication side effect -  Follow up Falls evaluation completed;Education provided;Falls prevention discussed Falls evaluation completed Falls evaluation completed Falls evaluation completed;Education provided;Falls prevention discussed Falls evaluation completed    FALL RISK PREVENTION PERTAINING TO THE HOME:  Any stairs in or around  the home? Yes  If so, are there any without handrails? No  Home free of loose throw rugs in walkways, pet beds, electrical cords, etc? Yes  Adequate lighting in your home to reduce risk of falls? Yes   ASSISTIVE DEVICES UTILIZED TO PREVENT FALLS:  Life alert? No  Use of a cane, walker or w/c? No  Grab bars in the bathroom? No  Shower chair or bench in shower? No  Elevated toilet seat or a handicapped toilet? No   TIMED UP AND GO:  Was the test performed? No .      Cognitive Function:     6CIT Screen 03/20/2021 03/14/2020 02/09/2019  What Year? 0 points 0 points 0 points  What month? 0 points 0 points 0 points  What time? 0 points 0 points 0 points  Count back from 20 0 points 2 points 0 points  Months in reverse 0 points 0 points 0 points  Repeat phrase 2 points 2 points 0 points  Total Score 2 4 0    Immunizations Immunization History  Administered Date(s) Administered   PFIZER(Purple Top)SARS-COV-2 Vaccination 10/18/2019, 11/09/2019, 07/11/2020   Pneumococcal Conjugate-13 09/10/2016   Pneumococcal Polysaccharide-23 09/16/2017   Tdap 04/06/2015    TDAP status: Up to date  Flu Vaccine status: Declined, Education has been provided regarding the importance of this vaccine but patient still declined. Advised may receive this vaccine at local pharmacy or Health Dept. Aware to provide a copy of the vaccination record if obtained from local pharmacy or Health Dept. Verbalized acceptance and understanding.  Pneumococcal vaccine status: Up to date  Covid-19 vaccine status: Completed vaccines  Qualifies for Shingles Vaccine? Yes   Zostavax completed No   Shingrix Completed?: No.    Education has been provided regarding the importance of this vaccine. Patient has been advised to call insurance company to determine out of pocket expense if they have not yet received this vaccine. Advised may also receive vaccine at local pharmacy or Health Dept. Verbalized acceptance and  understanding.  Screening Tests Health Maintenance  Topic Date Due   Zoster Vaccines- Shingrix (1 of 2) Never done   COVID-19 Vaccine (4 - Booster for Pfizer series) 10/09/2020   INFLUENZA VACCINE  03/05/2021   MAMMOGRAM  06/27/2021 (Originally 04/11/1999)   HEMOGLOBIN A1C  04/13/2021   FOOT EXAM  06/27/2021   OPHTHALMOLOGY EXAM  01/30/2022   COLONOSCOPY (Pts 45-81yrs Insurance coverage will need to be confirmed)  07/05/2024   TETANUS/TDAP  04/05/2025   DEXA SCAN  Completed   Hepatitis C Screening  Completed   PNA vac Low Risk Adult  Completed   HPV VACCINES  Aged Out    Health Maintenance  Health Maintenance Due  Topic Date Due   Zoster Vaccines- Shingrix (1 of 2) Never done   COVID-19 Vaccine (4 - Booster for Pfizer series) 10/09/2020   INFLUENZA VACCINE  03/05/2021    Colorectal cancer screening: Type of screening: Colonoscopy. Completed 07/05/2014. Repeat every 10 years  Mammogram status: decline  Bone Density status: Completed 01/13/2017.   Lung Cancer Screening: (Low Dose CT Chest recommended if Age 31-80 years, 30 pack-year currently smoking OR have quit w/in 15years.) does not qualify.   Lung Cancer Screening Referral: no  Additional Screening:  Hepatitis C Screening: does qualify; Completed 09/10/2016  Vision Screening: Recommended annual ophthalmology exams for early detection of glaucoma and other disorders of the eye. Is the patient up to date with their annual eye exam?  Yes  Who is the provider or what is the name of the office in which the patient attends annual eye exams? [facility] If pt is not established with a provider, would they like to be referred to a provider to establish care? No .   Dental Screening: Recommended annual dental exams for proper oral hygiene  Community Resource Referral / Chronic Care Management: CRR required this visit?  No   CCM required this visit?  No      Plan:     I have personally reviewed and noted the  following in the patient's chart:   Medical and social history Use of alcohol, tobacco or illicit drugs  Current medications and supplements including opioid prescriptions.  Functional ability and status Nutritional status Physical activity Advanced directives List of other physicians Hospitalizations, surgeries, and ER visits in previous 12 months Vitals Screenings to include cognitive, depression, and falls Referrals and appointments  In addition, I have reviewed and discussed with patient certain preventive protocols, quality metrics, and best practice recommendations. A written personalized care plan for preventive services as well as general preventive health recommendations were provided to patient.     Kellie Simmering, LPN   4/58/[id number]   Nurse Notes:

## 2021-03-20 NOTE — Patient Instructions (Signed)
Renee Boyd , Thank you for taking time to come for your Medicare Wellness Visit. I appreciate your ongoing commitment to your health goals. Please review the following plan we discussed and let me know if I can assist you in the future.   Screening recommendations/referrals: Colonoscopy: completed 07/05/2014 Mammogram: decline Bone Density: completed 01/13/2017 Recommended yearly ophthalmology/optometry visit for glaucoma screening and checkup Recommended yearly dental visit for hygiene and checkup  Vaccinations: Influenza vaccine: due Pneumococcal vaccine: completed 09/16/2017 Tdap vaccine: completed 04/06/2015, due 04/05/2025 Shingles vaccine: discussed   Covid-19:07/11/2020, 11/09/2019, 10/18/2019  Advanced directives: copy in chart  Conditions/risks identified: none  Next appointment: Follow up in one year for your annual wellness visit    Preventive Care 72 Years and Older, Female Preventive care refers to lifestyle choices and visits with your health care provider that can promote health and wellness. What does preventive care include? A yearly physical exam. This is also called an annual well check. Dental exams once or twice a year. Routine eye exams. Ask your health care provider how often you should have your eyes checked. Personal lifestyle choices, including: Daily care of your teeth and gums. Regular physical activity. Eating a healthy diet. Avoiding tobacco and drug use. Limiting alcohol use. Practicing safe sex. Taking low-dose aspirin every day. Taking vitamin and mineral supplements as recommended by your health care provider. What happens during an annual well check? The services and screenings done by your health care provider during your annual well check will depend on your age, overall health, lifestyle risk factors, and family history of disease. Counseling  Your health care provider may ask you questions about your: Alcohol use. Tobacco use. Drug  use. Emotional well-being. Home and relationship well-being. Sexual activity. Eating habits. History of falls. Memory and ability to understand (cognition). Work and work Statistician. Reproductive health. Screening  You may have the following tests or measurements: Height, weight, and BMI. Blood pressure. Lipid and cholesterol levels. These may be checked every 5 years, or more frequently if you are over 35 years old. Skin check. Lung cancer screening. You may have this screening every year starting at age 64 if you have a 30-pack-year history of smoking and currently smoke or have quit within the past 15 years. Fecal occult blood test (FOBT) of the stool. You may have this test every year starting at age 62. Flexible sigmoidoscopy or colonoscopy. You may have a sigmoidoscopy every 5 years or a colonoscopy every 10 years starting at age 83. Hepatitis C blood test. Hepatitis B blood test. Sexually transmitted disease (STD) testing. Diabetes screening. This is done by checking your blood sugar (glucose) after you have not eaten for a while (fasting). You may have this done every 1-3 years. Bone density scan. This is done to screen for osteoporosis. You may have this done starting at age 42. Mammogram. This may be done every 1-2 years. Talk to your health care provider about how often you should have regular mammograms. Talk with your health care provider about your test results, treatment options, and if necessary, the need for more tests. Vaccines  Your health care provider may recommend certain vaccines, such as: Influenza vaccine. This is recommended every year. Tetanus, diphtheria, and acellular pertussis (Tdap, Td) vaccine. You may need a Td booster every 10 years. Zoster vaccine. You may need this after age 9. Pneumococcal 13-valent conjugate (PCV13) vaccine. One dose is recommended after age 25. Pneumococcal polysaccharide (PPSV23) vaccine. One dose is recommended after age  52. Talk  to your health care provider about which screenings and vaccines you need and how often you need them. This information is not intended to replace advice given to you by your health care provider. Make sure you discuss any questions you have with your health care provider. Document Released: 08/18/2015 Document Revised: 04/10/2016 Document Reviewed: 05/23/2015 Elsevier Interactive Patient Education  2017 [place] Prevention in the Home Falls can cause injuries. They can happen to people of all ages. There are many things you can do to make your home safe and to help prevent falls. What can I do on the outside of my home? Regularly fix the edges of walkways and driveways and fix any cracks. Remove anything that might make you trip as you walk through a door, such as a raised step or threshold. Trim any bushes or trees on the path to your home. Use bright outdoor lighting. Clear any walking paths of anything that might make someone trip, such as rocks or tools. Regularly check to see if handrails are loose or broken. Make sure that both sides of any steps have handrails. Any raised decks and porches should have guardrails on the edges. Have any leaves, snow, or ice cleared regularly. Use sand or salt on walking paths during winter. Clean up any spills in your garage right away. This includes oil or grease spills. What can I do in the bathroom? Use night lights. Install grab bars by the toilet and in the tub and shower. Do not use towel bars as grab bars. Use non-skid mats or decals in the tub or shower. If you need to sit down in the shower, use a plastic, non-slip stool. Keep the floor dry. Clean up any water that spills on the floor as soon as it happens. Remove soap buildup in the tub or shower regularly. Attach bath mats securely with double-sided non-slip rug tape. Do not have throw rugs and other things on the floor that can make you trip. What can I do in the  bedroom? Use night lights. Make sure that you have a light by your bed that is easy to reach. Do not use any sheets or blankets that are too big for your bed. They should not hang down onto the floor. Have a firm chair that has side arms. You can use this for support while you get dressed. Do not have throw rugs and other things on the floor that can make you trip. What can I do in the kitchen? Clean up any spills right away. Avoid walking on wet floors. Keep items that you use a lot in easy-to-reach places. If you need to reach something above you, use a strong step stool that has a grab bar. Keep electrical cords out of the way. Do not use floor polish or wax that makes floors slippery. If you must use wax, use non-skid floor wax. Do not have throw rugs and other things on the floor that can make you trip. What can I do with my stairs? Do not leave any items on the stairs. Make sure that there are handrails on both sides of the stairs and use them. Fix handrails that are broken or loose. Make sure that handrails are as long as the stairways. Check any carpeting to make sure that it is firmly attached to the stairs. Fix any carpet that is loose or worn. Avoid having throw rugs at the top or bottom of the stairs. If you do have throw rugs, attach  them to the floor with carpet tape. Make sure that you have a light switch at the top of the stairs and the bottom of the stairs. If you do not have them, ask someone to add them for you. What else can I do to help prevent falls? Wear shoes that: Do not have high heels. Have rubber bottoms. Are comfortable and fit you well. Are closed at the toe. Do not wear sandals. If you use a stepladder: Make sure that it is fully opened. Do not climb a closed stepladder. Make sure that both sides of the stepladder are locked into place. Ask someone to hold it for you, if possible. Clearly mark and make sure that you can see: Any grab bars or  handrails. First and last steps. Where the edge of each step is. Use tools that help you move around (mobility aids) if they are needed. These include: Canes. Walkers. Scooters. Crutches. Turn on the lights when you go into a dark area. Replace any light bulbs as soon as they burn out. Set up your furniture so you have a clear path. Avoid moving your furniture around. If any of your floors are uneven, fix them. If there are any pets around you, be aware of where they are. Review your medicines with your doctor. Some medicines can make you feel dizzy. This can increase your chance of falling. Ask your doctor what other things that you can do to help prevent falls. This information is not intended to replace advice given to you by your health care provider. Make sure you discuss any questions you have with your health care provider. Document Released: 05/18/2009 Document Revised: 12/28/2015 Document Reviewed: 08/26/2014 Elsevier Interactive Patient Education  2017 [employer].

## 2021-03-27 NOTE — Progress Notes (Signed)
Remote pacemaker transmission.   

## 2021-03-28 ENCOUNTER — Other Ambulatory Visit: Payer: Self-pay | Admitting: Family Medicine

## 2021-03-28 DIAGNOSIS — L209 Atopic dermatitis, unspecified: Secondary | ICD-10-CM

## 2021-03-28 MED ORDER — DESONIDE 0.05 % EX LOTN: TOPICAL_LOTION | Freq: Two times a day (BID) | CUTANEOUS | 0 refills | Status: DC

## 2021-03-28 NOTE — Telephone Encounter (Signed)
Medication Refill - Medication: desonide (DESOWEN) 0.05 % lotion   Has the patient contacted their pharmacy? No. (Agent: If no, request that the patient contact the pharmacy for the refill.) (Agent: If yes, when and what did the pharmacy advise?)  Preferred Pharmacy (with phone number or street name):  [place], [place]  [address] [place] [address]  Phone: [phone] Fax: [phone]    Agent: Please be advised that RX refills may take up to 3 business days. We ask that you follow-up with your pharmacy.

## 2021-04-03 ENCOUNTER — Other Ambulatory Visit: Payer: Self-pay

## 2021-04-03 DIAGNOSIS — E039 Hypothyroidism, unspecified: Secondary | ICD-10-CM

## 2021-04-03 MED ORDER — LEVOTHYROXINE SODIUM 25 MCG PO TABS: 25.0000 ug | ORAL_TABLET | Freq: Every day | ORAL | 0 refills | Status: DC

## 2021-04-13 ENCOUNTER — Other Ambulatory Visit: Payer: Self-pay | Admitting: Family Medicine

## 2021-04-13 DIAGNOSIS — F5101 Primary insomnia: Secondary | ICD-10-CM

## 2021-04-13 NOTE — Telephone Encounter (Signed)
Requested medication (s) are due for refill today - yes  Requested medication (s) are on the active medication list -yes  Future visit scheduled -yes  Last refill: 02/12/21 #30 2RF  Notes to clinic: Request RF: non delegated Rx  Requested Prescriptions  Pending Prescriptions Disp Refills   temazepam (RESTORIL) 30 MG capsule [Pharmacy Med Name: TEMAZEPAM 30 MG CAP] 30 capsule     Sig: TAKE 1 CAPSULE BY MOUTH AT BEDTIME AS NEEDED SLEEP     Not Delegated - Psychiatry:  Anxiolytics/Hypnotics Failed - 04/13/2021 10:05 AM      Failed - This refill cannot be delegated      Failed - Urine Drug Screen completed in last 360 days      Passed - Valid encounter within last 6 months    Recent Outpatient Visits           3 months ago Benign hypertension with CKD (chronic kidney disease) stage IV ([place])   [facility] Olin Hauser, DO   9 months ago Benign hypertension with CKD (chronic kidney disease) stage IV ([place])   [facility] Olin Hauser, DO   1 year ago Primary osteoarthritis involving multiple joints   [facility], Devonne Doughty, DO   1 year ago Acute respiratory failure with hypoxia [facility])   [facility] Olin Hauser, DO   1 year ago Type 2 diabetes with nephropathy [facility])   [place], DO       Future Appointments             In 2 months Parks [place], Devonne Doughty, DO [facility], [place]   In 11 months  [facility], [facility]               Requested Prescriptions  Pending Prescriptions Disp Refills   temazepam (RESTORIL) 30 MG capsule [Pharmacy Med Name: TEMAZEPAM 30 MG CAP] 30 capsule     Sig: TAKE 1 CAPSULE BY MOUTH AT BEDTIME AS NEEDED SLEEP     Not Delegated - Psychiatry:  Anxiolytics/Hypnotics Failed - 04/13/2021 10:05 AM      Failed - This refill cannot be delegated      Failed - Urine Drug  Screen completed in last 360 days      Passed - Valid encounter within last 6 months    Recent Outpatient Visits           3 months ago Benign hypertension with CKD (chronic kidney disease) stage IV [facility])   [facility], Devonne Doughty, DO   9 months ago Benign hypertension with CKD (chronic kidney disease) stage IV [facility])   [facility] Olin Hauser, DO   1 year ago Primary osteoarthritis involving multiple joints   [place], DO   1 year ago Acute respiratory failure with hypoxia [facility])   [facility] Olin Hauser, DO   1 year ago Type 2 diabetes with nephropathy [facility])   [facility] Parks Ranger, Devonne Doughty, DO       Future Appointments             In 2 months Parks Ranger, Devonne Doughty, [facility], [place]   In 11 months  [facility], [facility]

## 2021-04-17 ENCOUNTER — Ambulatory Visit: Payer: Self-pay | Admitting: *Deleted

## 2021-04-17 NOTE — Telephone Encounter (Signed)
Pt reports left ear pain, onset last Monday 04/09/21. States now into shoulder and neck. States "It's my jaw, not my ear. Dr. Parks Ranger knows all this."   States 8-9/10 pain when occurs. Has taken tylenol with some relief. Using ice "helps." Denies fever, no drainage "It's my jaw."  Pt saw Dr. Tami Ribas in May for issue. Questioned if she has alerted him, states "No I want to see my doctor." No availability within protocol timeframe of 24 hours. Assured pt NT would route to practice for PCPs review and final disposition. Pt adamant about seeing only Dr. Particia Lather.  Please advise: CB# [phone]     Reason for Disposition  Earache  (Exceptions: brief ear pain of < 60 minutes duration, earache occurring during air travel  Answer Assessment - Initial Assessment Questions 1. LOCATION: "Which ear is involved?"     left 2. ONSET: "When did the ear start hurting"      Last Monday 04/09/21 H/O for 1 year 3. SEVERITY: "How bad is the pain?"  (Scale 1-10; mild, moderate or severe)   - MILD (1-3): doesn't interfere with normal activities    - MODERATE (4-7): interferes with normal activities or awakens from sleep    - SEVERE (8-10): excruciating pain, unable to do any normal activities      8-9/10 4. URI SYMPTOMS: "Do you have a runny nose or cough?"     no 5. FEVER: "Do you have a fever?" If Yes, ask: "What is your temperature, how was it measured, and when did it start?"     no 6. CAUSE: "Have you been swimming recently?", "How often do you use Q-TIPS?", "Have you had any recent air travel or scuba diving?"     no 7. OTHER SYMPTOMS: "Do you have any other symptoms?" (e.g., headache, stiff neck, dizziness, vomiting, runny nose, decreased hearing)     Left side of neck and shoulder painful now  Protocols used: Earache-A-AH

## 2021-04-17 NOTE — Telephone Encounter (Signed)
We discussed this briefly back in May 2022. She was seeing Dr Tami Ribas as well for ear wax flushing and ear pain.  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  She would need a visit in person or Virtual Visit would work to discuss and treat further.  Let me know if there was something in particular or question she had - otherwise I am not able to really treat her without discussing this further during an appointment.  [name], [facility] 04/17/2021, 2:42 PM

## 2021-04-17 NOTE — Telephone Encounter (Signed)
Patient called in seeking an appointment with Dr Raliegh Ip for today state that she have been sick for some time now started out with ear pain then moved down to her neck and left shoulder and state that no pain medication she tried have done anything for her pain. Asking for a call back please at Ph# [phone]   Attempted to call patient- no answer- voicemail not set up- unable to leave call back message.

## 2021-04-18 ENCOUNTER — Other Ambulatory Visit: Payer: Self-pay

## 2021-04-18 MED ORDER — ALLOPURINOL 100 MG PO TABS: 100.0000 mg | ORAL_TABLET | Freq: Every day | ORAL | 3 refills | Status: DC

## 2021-04-19 ENCOUNTER — Telehealth: Payer: Self-pay

## 2021-04-19 DIAGNOSIS — R809 Proteinuria, unspecified: Secondary | ICD-10-CM | POA: Diagnosis not present

## 2021-04-19 DIAGNOSIS — E875 Hyperkalemia: Secondary | ICD-10-CM | POA: Diagnosis not present

## 2021-04-19 DIAGNOSIS — N2581 Secondary hyperparathyroidism of renal origin: Secondary | ICD-10-CM | POA: Diagnosis not present

## 2021-04-19 DIAGNOSIS — N184 Chronic kidney disease, stage 4 (severe): Secondary | ICD-10-CM | POA: Diagnosis not present

## 2021-04-19 DIAGNOSIS — E1122 Type 2 diabetes mellitus with diabetic chronic kidney disease: Secondary | ICD-10-CM | POA: Diagnosis not present

## 2021-04-19 DIAGNOSIS — I129 Hypertensive chronic kidney disease with stage 1 through stage 4 chronic kidney disease, or unspecified chronic kidney disease: Secondary | ICD-10-CM | POA: Diagnosis not present

## 2021-04-19 NOTE — Telephone Encounter (Signed)
Copied from [place] [id number]. Topic: General - Other >> Apr 17, 2021  8:20 AM Leward Quan A wrote: Reason for CRM: Patient called in seeking an appointment with Dr Raliegh Ip for today state that she have been sick for some time now started out with ear pain then moved down to her neck and left shoulder and state that no pain medication she tried have done anything for her pain. Asking for a call back please at Ph# [phone]   Please advise. We do not have any openings until next week.

## 2021-04-19 NOTE — Telephone Encounter (Signed)
I am not really sure how to treat this particular complaint without evaluating her in a visit.  She has some stronger pain medicine and if that is not working, she needs to be assessed.  I would recommend Urgent Care if it is more severe.  Otherwise scheduling for next available apt is probably best I can do. Virtual may not be sufficient for this issue.  [name], [facility] 04/19/2021, 11:19 AM

## 2021-04-26 DIAGNOSIS — E872 Acidosis: Secondary | ICD-10-CM | POA: Diagnosis not present

## 2021-04-26 DIAGNOSIS — R809 Proteinuria, unspecified: Secondary | ICD-10-CM | POA: Diagnosis not present

## 2021-04-26 DIAGNOSIS — E1122 Type 2 diabetes mellitus with diabetic chronic kidney disease: Secondary | ICD-10-CM | POA: Diagnosis not present

## 2021-04-26 DIAGNOSIS — E875 Hyperkalemia: Secondary | ICD-10-CM | POA: Diagnosis not present

## 2021-04-26 DIAGNOSIS — N2581 Secondary hyperparathyroidism of renal origin: Secondary | ICD-10-CM | POA: Diagnosis not present

## 2021-04-26 DIAGNOSIS — I129 Hypertensive chronic kidney disease with stage 1 through stage 4 chronic kidney disease, or unspecified chronic kidney disease: Secondary | ICD-10-CM | POA: Diagnosis not present

## 2021-04-26 DIAGNOSIS — N184 Chronic kidney disease, stage 4 (severe): Secondary | ICD-10-CM | POA: Diagnosis not present

## 2021-04-27 ENCOUNTER — Other Ambulatory Visit: Payer: Self-pay | Admitting: Internal Medicine

## 2021-04-27 ENCOUNTER — Other Ambulatory Visit: Payer: Self-pay | Admitting: Family Medicine

## 2021-04-27 DIAGNOSIS — G8929 Other chronic pain: Secondary | ICD-10-CM

## 2021-04-27 DIAGNOSIS — M25552 Pain in left hip: Secondary | ICD-10-CM

## 2021-04-27 DIAGNOSIS — M8949 Other hypertrophic osteoarthropathy, multiple sites: Secondary | ICD-10-CM

## 2021-04-27 DIAGNOSIS — M159 Polyosteoarthritis, unspecified: Secondary | ICD-10-CM

## 2021-04-27 NOTE — Telephone Encounter (Signed)
Requested medication (s) are due for refill today - no  Requested medication (s) are on the active medication list-no  Future visit scheduled -yes  Last refill: 05/17/20  Notes to clinic: Request RF: non delegated Rx, duplicate request- discontinued medication  Requested Prescriptions  Pending Prescriptions Disp Refills   traMADol (ULTRAM) 50 MG tablet [Pharmacy Med Name: TRAMADOL HCL 50 MG TAB] 60 tablet     Sig: TAKE 1 TABLET BY MOUTH TWICE DAILY     Not Delegated - Analgesics:  Opioid Agonists Failed - 04/27/2021 10:50 AM      Failed - This refill cannot be delegated      Failed - Urine Drug Screen completed in last 360 days      Passed - Valid encounter within last 6 months    Recent Outpatient Visits           4 months ago Benign hypertension with CKD (chronic kidney disease) stage IV ([place])   [facility], Devonne Doughty, DO   10 months ago Benign hypertension with CKD (chronic kidney disease) stage IV ([place])   [facility] Olin [facility]er, DO   1 year ago Primary osteoarthritis involving multiple joints   [facility] [place], Devonne Doughty, DO   1 year ago Acute respiratory failure with hypoxia [facility])   [facility]lin [facility], DO   1 year ago Type 2 diabetes with nephropathy [facility])   [place], DO       Future Appointments             In 2 months Parks Ranger, Devonne Doughty, DO [facility], [place]   In 11 months  [facility], [facility]               Requested Prescriptions  Pending Prescriptions Disp Refills   traMADol (ULTRAM) 50 MG tablet [Pharmacy Med Name: TRAMADOL HCL 50 MG TAB] 60 tablet     Sig: TAKE 1 TABLET BY MOUTH TWICE DAILY     Not Delegated - Analgesics:  Opioid Agonists Failed - 04/27/2021 10:50 AM      Failed - This refill cannot be delegated      Failed - Urine Drug Screen completed  in last 360 days      Passed - Valid encounter within last 6 months    Recent Outpatient Visits           4 months ago Benign hypertension with CKD (chronic kidney disease) stage IV [facility])   [place], DO   10 months ago Benign hypertension with CKD (chronic kidney disease) stage IV [facility])   [place], DO   1 year ago Primary osteoarthritis involving multiple joints   [place], DO   1 year ago Acute respiratory failure with hypoxia [facility])   [facility] Olin Haus[facility], DO   1 year ago Type 2 diabetes with nephropathy [facility])   [facility] [place], Devonne Doughty, DO       Future Appointments             In 2 months Parks Ranger, Devonne Doughty, [facility], [place]   In 11 months  [facility], [facility]

## 2021-05-16 DIAGNOSIS — E782 Mixed hyperlipidemia: Secondary | ICD-10-CM | POA: Diagnosis not present

## 2021-05-16 DIAGNOSIS — E1159 Type 2 diabetes mellitus with other circulatory complications: Secondary | ICD-10-CM | POA: Diagnosis not present

## 2021-05-16 DIAGNOSIS — N1832 Chronic kidney disease, stage 3b: Secondary | ICD-10-CM | POA: Diagnosis not present

## 2021-05-16 DIAGNOSIS — E1122 Type 2 diabetes mellitus with diabetic chronic kidney disease: Secondary | ICD-10-CM | POA: Diagnosis not present

## 2021-05-16 DIAGNOSIS — I10 Essential (primary) hypertension: Secondary | ICD-10-CM | POA: Diagnosis not present

## 2021-05-16 LAB — HEMOGLOBIN A1C: Hemoglobin A1C: 7.5

## 2021-05-17 ENCOUNTER — Other Ambulatory Visit: Payer: Self-pay | Admitting: Internal Medicine

## 2021-05-17 NOTE — Telephone Encounter (Signed)
This is a [place] pt 

## 2021-05-31 ENCOUNTER — Ambulatory Visit (INDEPENDENT_AMBULATORY_CARE_PROVIDER_SITE_OTHER): Payer: Medicare Other

## 2021-05-31 DIAGNOSIS — I442 Atrioventricular block, complete: Secondary | ICD-10-CM

## 2021-05-31 LAB — CUP PACEART REMOTE DEVICE CHECK
Battery Remaining Longevity: 10 mo
Battery Voltage: 2.88 V
Brady Statistic AP VP Percent: 99.98 %
Brady Statistic AP VS Percent: 0 %
Brady Statistic AS VP Percent: 0.01 %
Brady Statistic AS VS Percent: 0 %
Brady Statistic RA Percent Paced: 99.97 %
Brady Statistic RV Percent Paced: 100 %
Date Time Interrogation Session: 20221027100659
Implantable Lead Implant Date: 20141217
Implantable Lead Implant Date: 20141217
Implantable Lead Location: 753859
Implantable Lead Location: 753860
Implantable Lead Model: 5076
Implantable Lead Model: 5076
Implantable Pulse Generator Implant Date: 20141217
Lead Channel Impedance Value: 418 Ohm
Lead Channel Impedance Value: 418 Ohm
Lead Channel Impedance Value: 494 Ohm
Lead Channel Impedance Value: 513 Ohm
Lead Channel Pacing Threshold Amplitude: 0.375 V
Lead Channel Pacing Threshold Amplitude: 0.75 V
Lead Channel Pacing Threshold Pulse Width: 0.4 ms
Lead Channel Pacing Threshold Pulse Width: 0.4 ms
Lead Channel Sensing Intrinsic Amplitude: 16.875 mV
Lead Channel Sensing Intrinsic Amplitude: 16.875 mV
Lead Channel Sensing Intrinsic Amplitude: 4 mV
Lead Channel Sensing Intrinsic Amplitude: 4 mV
Lead Channel Setting Pacing Amplitude: 1.75 V
Lead Channel Setting Pacing Amplitude: 2.5 V
Lead Channel Setting Pacing Pulse Width: 0.4 ms
Lead Channel Setting Sensing Sensitivity: 0.9 mV

## 2021-06-05 ENCOUNTER — Other Ambulatory Visit: Payer: Self-pay

## 2021-06-05 ENCOUNTER — Encounter: Payer: Self-pay | Admitting: Internal Medicine

## 2021-06-05 ENCOUNTER — Ambulatory Visit (INDEPENDENT_AMBULATORY_CARE_PROVIDER_SITE_OTHER): Payer: Medicare Other | Admitting: Internal Medicine

## 2021-06-05 VITALS — BP 128/80 | HR 62 | Ht 59.0 in | Wt 162.0 lb

## 2021-06-05 DIAGNOSIS — I4819 Other persistent atrial fibrillation: Secondary | ICD-10-CM | POA: Diagnosis not present

## 2021-06-05 DIAGNOSIS — I10 Essential (primary) hypertension: Secondary | ICD-10-CM

## 2021-06-05 DIAGNOSIS — I442 Atrioventricular block, complete: Secondary | ICD-10-CM

## 2021-06-05 DIAGNOSIS — I4729 Other ventricular tachycardia: Secondary | ICD-10-CM | POA: Diagnosis not present

## 2021-06-05 DIAGNOSIS — Z950 Presence of cardiac pacemaker: Secondary | ICD-10-CM

## 2021-06-05 DIAGNOSIS — Z79899 Other long term (current) drug therapy: Secondary | ICD-10-CM

## 2021-06-05 LAB — PACEMAKER DEVICE OBSERVATION

## 2021-06-05 NOTE — Progress Notes (Signed)
Patient Care Team: Olin Hauser, DO as PCP - General (Family Medicine) Lavonia Dana, MD as Consulting Physician (Nephrology) Deboraha Sprang, MD as Consulting Physician (Cardiology) Edrick Kins, DPM as Consulting Physician (Podiatry) Vanita Ingles, RN as Case Manager (General Practice)   HPI  Renee Boyd is a 70 y.o. female Seen in followup for largely asymptomatic persistent atrial fibrillation.  She transferred care to here 7/17.  At that time, anticoagulation was initiated w Apixoban    She is s/p pacemaker insertion Medtronic 2014 for sinus node dysfunction and CHB   Previously exposed to [place] when started [employer].   Underwent cardioversion 7/21 with significant improvement in symptoms.  She reverted back to atrial fib/flutter shortly after her last office visit.  Anticipated repeat cardioversion following reloading with amiodarone to assess benefit of restoring sinus rhythm done 3/22   The patient denies chest pain, nocturnal dyspnea, orthopnea.  There have been no palpitations, lightheadedness or syncope.  Complains of chronic dyspnea but it is much improved over recent months.  She has moved out from being with her son and daughter-in-law with her salt intake will be under control.  She is much less edematous, diuretics are being managed by Dr. Juleen China.  Is also started her on Jardiance also associated improvement in dyspnea.   Patient denies symptoms of GI intolerance, sun sensitivity, neurological symptoms attributable to amiodarone.      Records and Results Reviewed Date Cr K Hgb LFTs TSH  10/17 1.36  8.8  0.304  2/18 1.73 4.9   0.6  6/18  1.3  11.1    1/20 1.2 4.3 11.3    1/21 1.32 3.2 9.6 48   7/21 2.37 4.2 12.4 38 0.59  8/21 2.64 (CE)      12/21 2.64 (CE)       3/22 1.9. 4.5 12.1 28 0.642  9/22 1.78 4.4 12.0     DATE TEST EF   7/17 Echo   60-65 % Pulm HTN 64 (no E/E')  12/19  Echo  50-55% LVH severe (17/16)   PAsys  50-55   MR mild LAE(37/2.0/34)  1/20 CTA  CAD 2V   FFR > 0.8 LAD/RCA  5/22 Myoview  75% No ischemia          .   Thromboembolic risk factors ( age -74, HTN-1, TIA/CVA-2, DM-1 , Gender-1) for a CHADSVASc Score of 6     Past Medical History:  Diagnosis Date   Complete heart block ([place])    a. s/p MDT PPM 2006 with device generator replacement 2014; b. followed by Dr. Caryl Comes   Diabetes [facility])    GERD (gastroesophageal reflux disease)    Hypertension    Hypothyroid    Pacemaker    Persistent atrial fibrillation ([place])    a. on Eliquis; b. CHADS2VASc 6 (HTN. age x 1, DM, TIA x 2, female); c. s/p DCCV 05/28/18   Pulmonary hypertension ([place])    a. echo 2017: EF of 60-65%, no RWMA, mildly dilated left atrium, RVSF normal, PASP 64 mmHg   TIA (transient ischemic attack)     Past Surgical History:  Procedure Laterality Date   ABDOMINAL HYSTERECTOMY     CARDIOVERSION N/A 05/28/2018   Procedure: CARDIOVERSION;  Surgeon: Minna Merritts, MD;  Location: [facility] ORS;  Service: Cardiovascular;  Laterality: N/A;   CARDIOVERSION N/A 03/01/2020   Procedure: CARDIOVERSION;  Surgeon: Kate Sable, MD;  Location: [facility] ORS;  Service: Cardiovascular;  Laterality: N/A;   CARDIOVERSION N/A 10/25/2020   Procedure: CARDIOVERSION;  Surgeon: Kate Sable, MD;  Location: [facility] ORS;  Service: Cardiovascular;  Laterality: N/A;   CHOLECYSTECTOMY     TONSILLECTOMY     TUBAL LIGATION      Current Outpatient Medications  Medication Sig Dispense Refill   acetaminophen (TYLENOL) 500 MG tablet Take 1,000 mg by mouth every 8 (eight) hours as needed.     allopurinol (ZYLOPRIM) 100 MG tablet Take 1 tablet (100 mg total) by mouth daily. 90 tablet 3   amiodarone (PACERONE) 200 MG tablet TAKE ONE AND ONE-HALF TABLETS (300 MG) ONCE DAILY (DOSE DECREASE) 135 tablet 3   amLODipine (NORVASC) 10 MG tablet Take 10 mg by mouth daily.     ascorbic acid (VITAMIN C) 1000 MG tablet Take 1,000 mg by mouth daily.      atorvastatin (LIPITOR) 20 MG tablet TAKE 1 TABLET DAILY 90 tablet 3   Baclofen 5 MG TABS Take 5 mg by mouth at bedtime. 90 tablet 3   COMBIVENT RESPIMAT 20-100 MCG/ACT AERS respimat INHALE 1 PUFF EVERY 6 HOURS AS NEEDED WHEEZING/ SHORTNESS OF BREATH 4 g 2   CONTOUR NEXT TEST test strip Use to check blood sugar up to twice a day. 200 each 5   COVID-19 mRNA vaccine, Pfizer, 30 MCG/0.3ML injection USE AS DIRECTED .3 mL 0   desonide (DESOWEN) 0.05 % lotion Apply topically 2 (two) times daily. 59 mL 0   Dulaglutide 3 MG/0.5ML SOPN Inject into the skin every Tuesday.     ELIQUIS 5 MG TABS tablet TAKE 1 TABLET TWICE A DAY 180 tablet 3   furosemide (LASIX) 20 MG tablet TAKE 3 TABLETS ONCE DAILY (DOSE INCREASE) 270 tablet 0   JARDIANCE 10 MG TABS tablet Take 10 mg by mouth daily.     levothyroxine (SYNTHROID) 25 MCG tablet Take 1 tablet (25 mcg total) by mouth daily before breakfast. 90 tablet 0   lisinopril (PRINIVIL,ZESTRIL) 20 MG tablet Take 1 tablet (20 mg total) by mouth daily. 90 tablet 3   metoprolol succinate (TOPROL-XL) 100 MG 24 hr tablet TAKE 1 TABLET DAILY WITH OR IMMEDIATELY FOLLOWING A MEAL 90 tablet 3   Multiple Vitamin (MULTIVITAMIN) capsule Take 1 capsule by mouth daily.     omeprazole (PRILOSEC) 20 MG capsule Take 1 capsule (20 mg total) by mouth 2 (two) times daily before a meal. 180 capsule 2   temazepam (RESTORIL) 30 MG capsule TAKE 1 CAPSULE BY MOUTH AT BEDTIME AS NEEDED SLEEP 30 capsule 3   traMADol (ULTRAM) 50 MG tablet Take 1 tablet (50 mg total) by mouth 2 (two) times daily. 60 tablet 0   No current facility-administered medications for this visit.    Allergies  Allergen Reactions   Morphine And Related Itching   Neosporin [Bacitracin-Polymyxin B]    Other     glue   Percocet [Oxycodone-Acetaminophen] Other (See Comments)    Hallucinations       Review of Systems negative except from HPI and PMH  Physical Exam BP 128/80 (BP Location: Left Arm, Patient Position:  Sitting, Cuff Size: Normal)   Pulse 62   Ht 4\' 11"  (1.499 m)   Wt 162 lb (73.5 kg)   SpO2 90%   BMI 32.72 kg/m  Well developed and well nourished in no acute distress HENT normal Neck supple with JVP 6 Clear Device pocket well healed; without hematoma or erythema.  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  We will continue her on amiodarone 300 mg daily and plan at the next visit to decrease it to 200 mg.  She needs amiodarone surveillance laboratories today  No bleeding.  Continue lower on Eliquis 5 twice daily  Mildly volume overloaded.  We will continue furosemide 60 daily.  And will defer to nephrology further up titration given her renal insufficiency.  Her diet is much more salt deplete and the edema is gradually resolving.  For a new apartment is on the second floor so she is having to walk.  We have discussed that recurrent atrial fibrillation may be manifest by worsening dyspnea.  But activity monitors from her device are significantly increased since her move.  Blood pressure is well controlled.  Continue amlodipine 10 metoprolol succinate 100 and lisinopril 20  No further chest pain.  Myoview was negative

## 2021-06-05 NOTE — Patient Instructions (Signed)
Medication Instructions:  - Your physician recommends that you continue on your current medications as directed. Please refer to the Current Medication list given to you today.  *If you need a refill on your cardiac medications before your next appointment, please call your pharmacy*   Lab Work: - Nira Conn will reach out to Dr. Parks Ranger to see if he can draw a Liver/ TSH on you.   If you have labs (blood work) drawn today and your tests are completely normal, you will receive your results only by: [place] (if you have MyChart) OR A paper copy in the mail If you have any lab test that is abnormal or we need to change your treatment, we will call you to review the results.   Testing/Procedures: - none ordered   Follow-Up: At [facility], you and your health needs are our priority.  As part of our continuing mission to provide you with exceptional heart care, we have created designated Provider Care Teams.  These Care Teams include your primary Cardiologist (physician) and Advanced Practice Providers (APPs -  Physician Assistants and Nurse Practitioners) who all work together to provide you with the care you need, when you need it.  We recommend signing up for the patient portal called "MyChart".  Sign up information is provided on this After Visit Summary.  MyChart is used to connect with patients for Virtual Visits (Telemedicine).  Patients are able to view lab/test results, encounter notes, upcoming appointments, etc.  Non-urgent messages can be sent to your provider as well.   To learn more about what you can do with MyChart, go to [email or web address].    Your next appointment:   6 month(s)  The format for your next appointment:   In Person  Provider:   Virl Axe, MD   Other Instructions N/a

## 2021-06-07 NOTE — Progress Notes (Signed)
Remote pacemaker transmission.   

## 2021-06-26 ENCOUNTER — Telehealth: Payer: Self-pay

## 2021-06-26 NOTE — Telephone Encounter (Signed)
Her apt with [place] is for a physical and we are planning to draw labs. We can check liver enzymes and TSH included with all labs tomorrow.  Will try to route results to Dr Caryl Comes and otherwise they will be on her chart within 24-48 hours for your review.  Thank you  [name], DO [facility] 06/26/2021, 11:49 AM

## 2021-06-26 NOTE — Telephone Encounter (Signed)
I spoke with the patient and she is aware of Dr. Jeronimo Norma message as stated below.  The patient voices understanding and was appreciative of the call back.  Will follow along for LFT/ TSH results.

## 2021-06-26 NOTE — Telephone Encounter (Signed)
The patient declined a lab draw in our office at her last appointment as she is a hard stick and prefers to have this done at her PCP office.  She is due for a Liver/ TSH for amiodarone (high risk medication use) & atrial fibrillation.  She is scheduled to see Dr. Parks Ranger tomorrow in clinic.  Will forward this message to her PCP to see if they can can obtain these labs at her clinic visit tomorrow.

## 2021-06-26 NOTE — Telephone Encounter (Signed)
Patient calling to make you aware that she has an appt with PCP in the morning and would like to know if we made them aware of the labs that Dr. Caryl Comes needs.   She stated that she is a hard stick and since she is going there in the morning she would like them to just draw it there.

## 2021-06-27 ENCOUNTER — Other Ambulatory Visit: Payer: Self-pay

## 2021-06-27 ENCOUNTER — Ambulatory Visit (INDEPENDENT_AMBULATORY_CARE_PROVIDER_SITE_OTHER): Payer: Medicare Other | Admitting: Family Medicine

## 2021-06-27 ENCOUNTER — Encounter: Payer: Self-pay | Admitting: Family Medicine

## 2021-06-27 VITALS — BP 140/70 | HR 88 | Ht 59.0 in | Wt 157.0 lb

## 2021-06-27 DIAGNOSIS — E785 Hyperlipidemia, unspecified: Secondary | ICD-10-CM

## 2021-06-27 DIAGNOSIS — Z79899 Other long term (current) drug therapy: Secondary | ICD-10-CM

## 2021-06-27 DIAGNOSIS — M159 Polyosteoarthritis, unspecified: Secondary | ICD-10-CM

## 2021-06-27 DIAGNOSIS — F5101 Primary insomnia: Secondary | ICD-10-CM | POA: Diagnosis not present

## 2021-06-27 DIAGNOSIS — E1169 Type 2 diabetes mellitus with other specified complication: Secondary | ICD-10-CM

## 2021-06-27 DIAGNOSIS — D649 Anemia, unspecified: Secondary | ICD-10-CM

## 2021-06-27 DIAGNOSIS — E782 Mixed hyperlipidemia: Secondary | ICD-10-CM | POA: Diagnosis not present

## 2021-06-27 DIAGNOSIS — I4819 Other persistent atrial fibrillation: Secondary | ICD-10-CM | POA: Diagnosis not present

## 2021-06-27 DIAGNOSIS — N184 Chronic kidney disease, stage 4 (severe): Secondary | ICD-10-CM

## 2021-06-27 DIAGNOSIS — G8929 Other chronic pain: Secondary | ICD-10-CM

## 2021-06-27 DIAGNOSIS — I129 Hypertensive chronic kidney disease with stage 1 through stage 4 chronic kidney disease, or unspecified chronic kidney disease: Secondary | ICD-10-CM | POA: Diagnosis not present

## 2021-06-27 DIAGNOSIS — M5442 Lumbago with sciatica, left side: Secondary | ICD-10-CM

## 2021-06-27 DIAGNOSIS — M25552 Pain in left hip: Secondary | ICD-10-CM | POA: Diagnosis not present

## 2021-06-27 DIAGNOSIS — E039 Hypothyroidism, unspecified: Secondary | ICD-10-CM

## 2021-06-27 NOTE — Assessment & Plan Note (Signed)
Followed by Dr Caryl Comes On [place]. S/p Cardioversion On anticoagulation Eliquis  Check TSH + CMET forward to Dr Caryl Comes

## 2021-06-27 NOTE — Assessment & Plan Note (Signed)
Followed by [facility]ocrin[facility] currently  Stable 7.5 Within range of 7-8 No hypoglycemia Complications - CKD-IV, other including, GERD, obesity, hypothyroidism, Gout - increases risk of future cardiovascular complications  - OFF Sulfonylurea, Metformin due to CKD, Rybelsus nausea  Followed by Dr Juleen China [facility] - cannot resume Metformin due to GFR  Plan Continue with current plan per Endocrine - Trulicity 3 mg weekly, Jardiance 10mg  Lifestyle recommendations

## 2021-06-27 NOTE — Assessment & Plan Note (Signed)
Improved Has Tramadol PRN

## 2021-06-27 NOTE — Assessment & Plan Note (Signed)
Controlled Last TSH normal will repeat now TSH + T4 On levothyroxine 65mcg daily - continue

## 2021-06-27 NOTE — Patient Instructions (Addendum)
Thank you for coming to the office today.  Labs today, will forward to Dr Caryl Comes TSH + CMET  Keep up the great work overall.  Continue Trulicity and Jardiance.  [place] when ready  Please schedule a Follow-up Appointment to: Return in about 6 months (around 12/25/2021) for 6 month follow-up DM, CKD, HTN updates.  If you have any other questions or concerns, please feel free to call the office or send a message through [place]. You may also schedule an earlier appointment if necessary.  Additionally, you may be receiving a survey about your experience at our office within a few days to 1 week by e-mail or mail. We value your feedback.  [name], DO [place]

## 2021-06-27 NOTE — Progress Notes (Signed)
Subjective:    Patient ID: Renee Boyd, female    DOB: February 11, 1951, 70 y.o.   MRN: [id number]  Renee Boyd is a 70 y.o. female presenting on 06/27/2021 for Annual Exam   HPI  Here for Annual Physical and Lab Orders.  Specialists: Endocrinology - Malissa Hippo NP [facility] Endocrinology) Nephrology - Dr Lavonia Dana [facility]) Cardiology - [place] / EP       CKD-IV with hyperkalemia and subnephrotic proteinuria HTN Secondary Anemia of chronic kidney disease  Secondary to DM HTN Followed by Dr Juleen China Kidney function reduced some then she did some classes on CKD and kidney function has improved. On Lisinopril for renal protection On Furosemide, ACEi, Metoprolol, Amlodipine Off sodium bicarbonate   Type 2 Diabetes Followed by [facility] Endocrinology currently due to advancing renal disease. Remains discontinued on Metformin due to renal function. She should avoid NSAIDs. But continue Lisinopril ACEi. Last lab A1c 7.5% (05/16/21) She uses Contour Next EZ Glucomter On Trulicity 3 mg weekly (inc by Endocrine) On Jardiance 10mg  - per Nephrology Off Metformin due to CKD Off Rybelsus due to nausea   Osteoarthritis, multiple joints / Back Pain / Hip Pain / Shoulder pain Chronic problem. Episodic painful flares of joint pain, worse with inc activity and storms and weather changes Previously managed on Tramadol PRN for pain and this was renally adjusted to max of 50mg  BID - still has if needs, may request re order in future  Cannot take NSAIDs due to CKD. On Gabapentin 300mg  daily doing well. Using baclofen 5mg  only max dose nightly   Persistent Atrial Fibrillation, s/p Cardioversion On Anticoagulation with Eliquis Complete Heart Block Precordial Chest Pain Followed by Dr Virl Axe She had Cardioversion in March 2022, unsuccessful. Last test 12/07/20 nuclear stress test. She is on Amiodarone now awaiting lab for thyroid.   Chronic Insomnia Primary problem difficulty  sleeping. She takes Temazepam nightly PRN. Will need refill in future   Health Maintenance: Declines Flu Shot Not updated COVID19 booster, will reconsider  Depression screen [facility] 2/9 03/20/2021 12/21/2020 06/27/2020  Decreased Interest 0 0 0  Down, Depressed, Hopeless 0 0 0  PHQ - 2 Score 0 0 0  Altered sleeping - 0 -  Tired, decreased energy - 0 -  Change in appetite - 0 -  Feeling bad or failure about yourself  - 0 -  Trouble concentrating - 0 -  Moving slowly or fidgety/restless - 0 -  Suicidal thoughts - 0 -  PHQ-9 Score - 0 -  Difficult doing work/chores - Not difficult at all -  Some recent data might be hidden    Past Medical History:  Diagnosis Date   Complete heart block ([place])    a. s/p MDT PPM 2006 with device generator replacement 2014; b. followed by Dr. Caryl Comes   [facility])    GERD (gastroesophageal reflux disease)    Hypertension    Hypothyroid    Pacemaker    Persistent atrial fibrillation ([place])    a. on Eliquis; b. CHADS2VASc 6 (HTN. age x 1, DM, TIA x 2, female); c. s/p DCCV 05/28/18   Pulmonary hypertension ([place])    a. echo 2017: EF of 60-65%, no RWMA, mildly dilated left atrium, RVSF normal, PASP 64 mmHg   TIA (transient ischemic attack)    Past Surgical History:  Procedure Laterality Date   ABDOMINAL HYSTERECTOMY     CARDIOVERSION N/A 05/28/2018   Procedure: CARDIOVERSION;  Surgeon: Minna Merritts, MD;  Location: [facility]  ORS;  Service: Cardiovascular;  Laterality: N/A;   CARDIOVERSION N/A 03/01/2020   Procedure: CARDIOVERSION;  Surgeon: Kate Sable, MD;  Location: [facility] ORS;  Service: Cardiovascular;  Laterality: N/A;   CARDIOVERSION N/A 10/25/2020   Procedure: CARDIOVERSION;  Surgeon: Kate Sable, MD;  Location: [facility] ORS;  Service: Cardiovascular;  Laterality: N/A;   CHOLECYSTECTOMY     TONSILLECTOMY     TUBAL LIGATION     Social History   Socioeconomic History   Marital status: Divorced    Spouse name: Not on file   Number of  children: 3   Years of education: Not on file   Highest education level: High school graduate  Occupational History   Occupation: retired  Tobacco Use   Smoking status: Former    Packs/day: 2.00    Years: 40.00    Pack years: 80.00    Types: Cigarettes    Quit date: 06/27/2005    Years since quitting: 16.0   Smokeless tobacco: Former   Tobacco comments:    quit Jun 27 2005  Vaping Use   Vaping Use: Never used  Substance and Sexual Activity   Alcohol use: No   Drug use: No   Sexual activity: Not Currently  Other Topics Concern   Not on file  Social History Narrative   Lives by self.   Social Determinants of Health   Financial Resource Strain: Low Risk    Difficulty of Paying Living Expenses: Not hard at all  Food Insecurity: No Food Insecurity   Worried About Charity fundraiser in the Last Year: Never true   [place] in the Last Year: Never true  Transportation Needs: No Transportation Needs   Lack of Transportation (Medical): No   Lack of Transportation (Non-Medical): No  Physical Activity: Inactive   Days of Exercise per Week: 0 days   Minutes of Exercise per Session: 0 min  Stress: No Stress Concern Present   Feeling of Stress : Not at all  Social Connections: Not on file  Intimate Partner Violence: Not on file   Family History  Problem Relation Age of Onset   Hypertension Other    Heart disease Mother        MI   Alcohol abuse Father    Autism Son    Heart disease Maternal Grandmother    Cancer Son        colon   Miscarriages / Stillbirths Maternal Aunt    Current Outpatient Medications on File Prior to Visit  Medication Sig   acetaminophen (TYLENOL) 500 MG tablet Take 1,000 mg by mouth every 8 (eight) hours as needed.   allopurinol (ZYLOPRIM) 100 MG tablet Take 1 tablet (100 mg total) by mouth daily.   amiodarone (PACERONE) 200 MG tablet TAKE ONE AND ONE-HALF TABLETS (300 MG) ONCE DAILY (DOSE DECREASE)   amLODipine (NORVASC) 10 MG tablet Take  10 mg by mouth daily.   ascorbic acid (VITAMIN C) 1000 MG tablet Take 1,000 mg by mouth daily.   atorvastatin (LIPITOR) 20 MG tablet TAKE 1 TABLET DAILY   Baclofen 5 MG TABS Take 5 mg by mouth at bedtime.   COMBIVENT RESPIMAT 20-100 MCG/ACT AERS respimat INHALE 1 PUFF EVERY 6 HOURS AS NEEDED WHEEZING/ SHORTNESS OF BREATH   CONTOUR NEXT TEST test strip Use to check blood sugar up to twice a day.   COVID-19 mRNA vaccine, Pfizer, 30 MCG/0.3ML injection USE AS DIRECTED   desonide (DESOWEN) 0.05 % lotion Apply topically 2 (two) times  daily.   Dulaglutide 3 MG/0.5ML SOPN Inject into the skin every Tuesday.   ELIQUIS 5 MG TABS tablet TAKE 1 TABLET TWICE A DAY   furosemide (LASIX) 20 MG tablet TAKE 3 TABLETS ONCE DAILY (DOSE INCREASE)   JARDIANCE 10 MG TABS tablet Take 10 mg by mouth daily.   levothyroxine (SYNTHROID) 25 MCG tablet Take 1 tablet (25 mcg total) by mouth daily before breakfast.   lisinopril (PRINIVIL,ZESTRIL) 20 MG tablet Take 1 tablet (20 mg total) by mouth daily.   metoprolol succinate (TOPROL-XL) 100 MG 24 hr tablet TAKE 1 TABLET DAILY WITH OR IMMEDIATELY FOLLOWING A MEAL   Multiple Vitamin (MULTIVITAMIN) capsule Take 1 capsule by mouth daily.   omeprazole (PRILOSEC) 20 MG capsule Take 1 capsule (20 mg total) by mouth 2 (two) times daily before a meal.   temazepam (RESTORIL) 30 MG capsule TAKE 1 CAPSULE BY MOUTH AT BEDTIME AS NEEDED SLEEP   traMADol (ULTRAM) 50 MG tablet Take 1 tablet (50 mg total) by mouth 2 (two) times daily.   No current facility-administered medications on file prior to visit.    Review of Systems  Constitutional:  Negative for activity change, appetite change, chills, diaphoresis, fatigue and fever.  HENT:  Negative for congestion and hearing loss.   Eyes:  Negative for visual disturbance.  Respiratory:  Negative for cough, chest tightness, shortness of breath and wheezing.   Cardiovascular:  Negative for chest pain, palpitations and leg swelling.   Gastrointestinal:  Negative for abdominal pain, constipation, diarrhea, nausea and vomiting.  Genitourinary:  Negative for dysuria, frequency and hematuria.  Musculoskeletal:  Negative for arthralgias and neck pain.  Skin:  Negative for rash.  Neurological:  Negative for dizziness, weakness, light-headedness, numbness and headaches.  Hematological:  Negative for adenopathy.  Psychiatric/Behavioral:  Negative for behavioral problems, dysphoric mood and sleep disturbance.   Per HPI unless specifically indicated above      Objective:    BP 140/70   Pulse 88   Ht 4\' 11"  (1.499 m)   Wt 157 lb (71.2 kg)   SpO2 98%   BMI 31.71 kg/m   Wt Readings from Last 3 Encounters:  06/27/21 157 lb (71.2 kg)  06/05/21 162 lb (73.5 kg)  03/20/21 170 lb (77.1 kg)    Physical Exam Vitals and nursing note reviewed.  Constitutional:      General: She is not in acute distress.    Appearance: She is well-developed. She is obese. She is not diaphoretic.     Comments: Well-appearing, comfortable, cooperative  HENT:     Head: Normocephalic and atraumatic.  Eyes:     General:        Right eye: No discharge.        Left eye: No discharge.     Conjunctiva/sclera: Conjunctivae normal.     Pupils: Pupils are equal, round, and reactive to light.  Neck:     Thyroid: No thyromegaly.  Cardiovascular:     Rate and Rhythm: Normal rate and regular rhythm.     Pulses: Normal pulses.     Heart sounds: Normal heart sounds. No murmur heard. Pulmonary:     Effort: Pulmonary effort is normal. No respiratory distress.     Breath sounds: Normal breath sounds. No wheezing or rales.  Abdominal:     General: Bowel sounds are normal. There is no distension.     Palpations: Abdomen is soft. There is no mass.     Tenderness: There is no abdominal tenderness.  Musculoskeletal:  General: No tenderness. Normal range of motion.     Cervical back: Normal range of motion and neck supple.     Right lower leg: No  edema.     Left lower leg: No edema.     Comments: Upper / Lower Extremities: - Normal muscle tone, strength bilateral upper extremities 5/5, lower extremities 5/5  Lymphadenopathy:     Cervical: No cervical adenopathy.  Skin:    General: Skin is warm and dry.     Findings: No erythema or rash.  Neurological:     Mental Status: She is alert and oriented to person, place, and time.     Comments: Distal sensation intact to light touch all extremities  Psychiatric:        Mood and Affect: Mood normal.        Behavior: Behavior normal.        Thought Content: Thought content normal.     Comments: Well groomed, good eye contact, normal speech and thoughts    Diabetic Foot Exam - Simple   Simple Foot Form Diabetic Foot exam was performed with the following findings: Yes 06/27/2021  9:29 AM  Visual Inspection No deformities, no ulcerations, no other skin breakdown bilaterally: Yes Sensation Testing Intact to touch and monofilament testing bilaterally: Yes Pulse Check Posterior Tibialis and Dorsalis pulse intact bilaterally: Yes Comments     Recent Labs    10/11/20 0000 05/16/21 0000  HGBA1C 7.6 7.5     Results for orders placed or performed in visit on 06/27/21  Hemoglobin A1c  Result Value Ref Range   Hemoglobin A1C 7.5       Assessment & Plan:   Problem List Items Addressed This Visit     Type 2 diabetes mellitus with hyperlipidemia ([place])    Follow[facility] by [facility] Endocrinology currently  Stable 7.5 Within range of 7-8 No hypoglycemia Complications - CKD-IV, other including, GERD, obesity, hypothyroidism, Gout - increases risk of future cardiovascular complications  - OFF Sulfonylurea, Metformin due to CKD, Rybelsus nausea  Followed by Dr Juleen China [facility] - cannot resume Metformin due to GFR  Plan Continue with current plan per Endocrine - Trulicity 3 mg weekly, Jardiance 10mg  Lifestyle recommendations      Primary insomnia    Controlled on Temazepam PRN, continue rx   Failed Ambien in past      Relevant Orders   COMPLETE METABOLIC PANEL WITH GFR   Persistent atrial fibrillation ([place])    Followed by Dr Caryl Comes On Amio. S/p Cardioversion On anticoagulation Eliquis  Check TSH + CMET forward to Dr Caryl Comes      Relevant Orders   COMPLETE METABOLIC PANEL WITH GFR   CBC with Differential/Platelet   Normocytic anemia   Hypothyroidism    Controlled Last TSH normal will repeat now TSH + T4 On levothyroxine 41mcg daily - continue      Relevant Orders   TSH   T4, free   Chronic left-sided low back pain with left-sided sciatica    Improved Has Tramadol PRN      Chronic hip pain   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) - Primary    Controlled HTN Complication with CKD-IV, CHB pacer, AFib Followed by Cardiology, Nephrology    Plan:  1. Continue current BP regimen - Metoprolol-XL 100mg  daily, Amlodipine 10mg , Lisinopril 20mg  daily - Furosemide 40-60mg  daily (x2-3 of 20mg  tabs) per Cardiology 2. Encourage improved lifestyle - low sodium diet, regular exercise 3. Continue monitor BP outside office,  bring readings to next visit, if persistently >140/90 or new symptoms notify office sooner      Relevant Orders   COMPLETE METABOLIC PANEL WITH GFR   CBC with Differential/Platelet   Lipid panel   Other Visit Diagnoses     High risk medication use       Relevant Orders   COMPLETE METABOLIC PANEL WITH GFR   CBC with Differential/Platelet   TSH   Mixed hyperlipidemia       Relevant Orders   Lipid panel   Primary osteoarthritis involving multiple joints           Updated Health Maintenance information Reviewed medications Updated on vaccines, due for covid booster. Labs today, forward Thyroid + CMET to Dr Caryl Comes (EP Cards) Encouraged improvement to lifestyle with diet and exercise Goal of weight loss  Orders Placed This Encounter  Procedures   Hemoglobin A1c    This external order was created through the  Results Console.   COMPLETE METABOLIC PANEL WITH GFR    Order Specific Question:   CC Results    Answer:   Deboraha Sprang [[id number]]   CBC with Differential/Platelet   Lipid panel    Order Specific Question:   Has the patient fasted?    Answer:   Yes   TSH    Order Specific Question:   CC Results    Answer:   Deboraha Sprang [[id number]]   T4, free    Order Specific Question:   CC Results    Answer:   Deboraha Sprang [[id number]]     No orders of the defined types were placed in this encounter.    Follow up plan: Return in about 6 months (around 12/25/2021) for 6 month follow-up DM, CKD, HTN updates.  [name], [facility] 06/27/2021, 9:21 AM

## 2021-06-27 NOTE — Assessment & Plan Note (Signed)
Controlled on Temazepam PRN, continue rx  Failed Ambien in past

## 2021-06-27 NOTE — Assessment & Plan Note (Signed)
Controlled HTN Complication with CKD-IV, CHB pacer, AFib Followed by Cardiology, Nephrology    Plan:  1. Continue current BP regimen - Metoprolol-XL 100mg  daily, Amlodipine 10mg , Lisinopril 20mg  daily - Furosemide 40-60mg  daily (x2-3 of 20mg  tabs) per Cardiology 2. Encourage improved lifestyle - low sodium diet, regular exercise 3. Continue monitor BP outside office, bring readings to next visit, if persistently >140/90 or new symptoms notify office sooner

## 2021-06-28 ENCOUNTER — Other Ambulatory Visit: Payer: Self-pay | Admitting: Internal Medicine

## 2021-06-28 LAB — COMPLETE METABOLIC PANEL WITH GFR
AG Ratio: 2 (calc) (ref 1.0–2.5)
ALT: 53 U/L — ABNORMAL HIGH (ref 6–29)
AST: 34 U/L (ref 10–35)
Albumin: 4.7 g/dL (ref 3.6–5.1)
Alkaline phosphatase (APISO): 75 U/L (ref 37–153)
BUN/Creatinine Ratio: 16 (calc) (ref 6–22)
BUN: 38 mg/dL — ABNORMAL HIGH (ref 7–25)
CO2: 27 mmol/L (ref 20–32)
Calcium: 9.9 mg/dL (ref 8.6–10.4)
Chloride: 104 mmol/L (ref 98–110)
Creat: 2.35 mg/dL — ABNORMAL HIGH (ref 0.60–1.00)
Globulin: 2.4 g/dL (calc) (ref 1.9–3.7)
Glucose, Bld: 131 mg/dL — ABNORMAL HIGH (ref 65–99)
Potassium: 4.2 mmol/L (ref 3.5–5.3)
Sodium: 142 mmol/L (ref 135–146)
Total Bilirubin: 0.4 mg/dL (ref 0.2–1.2)
Total Protein: 7.1 g/dL (ref 6.1–8.1)
eGFR: 21 mL/min/{1.73_m2} — ABNORMAL LOW (ref >=60)

## 2021-06-28 LAB — CBC WITH DIFFERENTIAL/PLATELET
Absolute Monocytes: 453 cells/uL (ref 200–950)
Basophils Absolute: 40 cells/uL (ref 0–200)
Basophils Relative: 0.9 %
Eosinophils Absolute: 251 cells/uL (ref 15–500)
Eosinophils Relative: 5.7 %
HCT: 37.2 % (ref 35.0–45.0)
Hemoglobin: 12.3 g/dL (ref 11.7–15.5)
Lymphs Abs: 1179 cells/uL (ref 850–3900)
MCH: 30.1 pg (ref 27.0–33.0)
MCHC: 33.1 g/dL (ref 32.0–36.0)
MCV: 91.2 fL (ref 80.0–100.0)
MPV: 12.6 fL — ABNORMAL HIGH (ref 7.5–12.5)
Monocytes Relative: 10.3 %
Neutro Abs: 2477 cells/uL (ref 1500–7800)
Neutrophils Relative %: 56.3 %
Platelets: 206 10*3/uL (ref 140–400)
RBC: 4.08 10*6/uL (ref 3.80–5.10)
RDW: 15.1 % — ABNORMAL HIGH (ref 11.0–15.0)
Total Lymphocyte: 26.8 %
WBC: 4.4 10*3/uL (ref 3.8–10.8)

## 2021-06-28 LAB — LIPID PANEL
Cholesterol: 135 mg/dL (ref <200)
HDL: 71 mg/dL (ref >=50)
LDL Cholesterol (Calc): 45 mg/dL (calc)
Non-HDL Cholesterol (Calc): 64 mg/dL (calc) (ref <130)
Total CHOL/HDL Ratio: 1.9 (calc) (ref <5.0)
Triglycerides: 106 mg/dL (ref <150)

## 2021-06-28 LAB — T4, FREE: Free T4: 1.8 ng/dL (ref 0.8–1.8)

## 2021-06-28 LAB — TSH: TSH: 0.61 mIU/L (ref 0.40–4.50)

## 2021-07-09 ENCOUNTER — Other Ambulatory Visit: Payer: Self-pay | Admitting: Internal Medicine

## 2021-07-09 NOTE — Telephone Encounter (Signed)
Eliquis 5 mg refill request received. Patient is 70 years old, weight- 71.2 kg, Crea- 2.35 on 06/27/21, Diagnosis-afib, and last seen by Dr. Caryl Comes on 06/05/21. Dose is appropriate based on dosing criteria. Will send in refill to requested pharmacy.

## 2021-07-10 LAB — CUP PACEART INCLINIC DEVICE CHECK
Battery Remaining Longevity: 10 mo
Battery Voltage: 2.88 V
Brady Statistic AP VP Percent: 99.88 %
Brady Statistic AP VS Percent: 0 %
Brady Statistic AS VP Percent: 0.12 %
Brady Statistic AS VS Percent: 0 %
Brady Statistic RA Percent Paced: 99.84 %
Brady Statistic RV Percent Paced: 100 %
Date Time Interrogation Session: 20221101092400
Implantable Lead Implant Date: 20141217
Implantable Lead Implant Date: 20141217
Implantable Lead Location: 753859
Implantable Lead Location: 753860
Implantable Lead Model: 5076
Implantable Lead Model: 5076
Implantable Pulse Generator Implant Date: 20141217
Lead Channel Impedance Value: 418 Ohm
Lead Channel Impedance Value: 418 Ohm
Lead Channel Impedance Value: 494 Ohm
Lead Channel Impedance Value: 513 Ohm
Lead Channel Pacing Threshold Amplitude: 0.75 V
Lead Channel Pacing Threshold Amplitude: 1 V
Lead Channel Pacing Threshold Pulse Width: 0.4 ms
Lead Channel Pacing Threshold Pulse Width: 0.4 ms
Lead Channel Sensing Intrinsic Amplitude: 16.875 mV
Lead Channel Sensing Intrinsic Amplitude: 16.875 mV
Lead Channel Sensing Intrinsic Amplitude: 2 mV
Lead Channel Sensing Intrinsic Amplitude: 4 mV
Lead Channel Setting Pacing Amplitude: 1.75 V
Lead Channel Setting Pacing Amplitude: 2.5 V
Lead Channel Setting Pacing Pulse Width: 0.4 ms
Lead Channel Setting Sensing Sensitivity: 0.9 mV

## 2021-07-13 ENCOUNTER — Other Ambulatory Visit: Payer: Self-pay | Admitting: Family Medicine

## 2021-07-13 DIAGNOSIS — E039 Hypothyroidism, unspecified: Secondary | ICD-10-CM

## 2021-07-13 MED ORDER — LEVOTHYROXINE SODIUM 25 MCG PO TABS
25.0000 ug | ORAL_TABLET | Freq: Every day | ORAL | 0 refills | Status: DC
Start: 2021-07-13 — End: 2021-10-12

## 2021-07-13 NOTE — Telephone Encounter (Signed)
Requested Prescriptions  Pending Prescriptions Disp Refills  . levothyroxine (SYNTHROID) 25 MCG tablet 90 tablet 0    Sig: Take 1 tablet (25 mcg total) by mouth daily before breakfast.     Endocrinology:  Hypothyroid Agents Failed - 07/13/2021 12:26 PM      Failed - TSH needs to be rechecked within 3 months after an abnormal result. Refill until TSH is due.      Passed - TSH in normal range and within 360 days    TSH  Date Value Ref Range Status  06/27/2021 0.61 0.40 - 4.50 mIU/L Final         Passed - Valid encounter within last 12 months    Recent Outpatient Visits          2 weeks ago Benign hypertension with CKD (chronic kidney disease) stage IV [facility])   [facility], Devonne Doughty, DO   6 months ago Benign hypertension with CKD (chronic kidney disease) stage IV [facility])   [facility] Olin Hauser, DO   1 year ago Benign hypertension with CKD (chronic kidney disease) stage IV [facility])   [place], DO   1 year ago Primary osteoarthritis involving multiple joints   [place], DO   1 year ago Acute respiratory failure with hypoxia [facility])   [facility] [place], Devonne Doughty, DO      Future Appointments            In 5 months [place], Devonne Doughty, DO [facility], [place]   In 8 months  [facility], [facility]

## 2021-07-13 NOTE — Telephone Encounter (Signed)
Medication Refill - Medication:  levothyroxine (SYNTHROID) 25 MCG Has the patient contacted their pharmacy? yes (Agent: If no, request that the patient contact the pharmacy for the refill. If patient does not wish to contact the pharmacy document the reason why and proceed with request.) (Agent: If yes, when and what did the pharmacy advise?)contact pcp/pharm called in  Preferred Pharmacy (with phone number or street name): [place], [place] Phone:  [phone]  Fax:  [phone]     Has the patient been seen for an appointment in the last year OR does the patient have an upcoming appointment? yes  Agent: Please be advised that RX refills may take up to 3 business days. We ask that you follow-up with your pharmacy.

## 2021-07-16 ENCOUNTER — Other Ambulatory Visit: Payer: Self-pay

## 2021-07-16 MED ORDER — APIXABAN 5 MG PO TABS: 5.0000 mg | ORAL_TABLET | Freq: Two times a day (BID) | ORAL | 1 refills | Status: DC

## 2021-07-16 NOTE — Telephone Encounter (Signed)
Eliquis 5 mg refill request received. Patient is 70 years old, weight-71.2 kg, Crea- 2.35 on 06/27/21, Diagnosis-afib, and last seen by Dr. Caryl Comes on 06/05/21. Dose is appropriate based on dosing criteria. Will send in refill to requested pharmacy.

## 2021-07-16 NOTE — Telephone Encounter (Signed)
Refill Request.  

## 2021-07-16 NOTE — Telephone Encounter (Signed)
*  STAT* If patient is at the pharmacy, call can be transferred to refill team.   1. Which medications need to be refilled? (please list name of each medication and dose if known) Eliquis  2. Which pharmacy/location (including street and city if local pharmacy) is medication to be sent to?[facility]  3. Do they need a 30 day or 90 day supply? 90  

## 2021-07-17 DIAGNOSIS — Z23 Encounter for immunization: Secondary | ICD-10-CM | POA: Diagnosis not present

## 2021-07-23 DIAGNOSIS — I129 Hypertensive chronic kidney disease with stage 1 through stage 4 chronic kidney disease, or unspecified chronic kidney disease: Secondary | ICD-10-CM | POA: Diagnosis not present

## 2021-07-23 DIAGNOSIS — E875 Hyperkalemia: Secondary | ICD-10-CM | POA: Diagnosis not present

## 2021-07-23 DIAGNOSIS — E1122 Type 2 diabetes mellitus with diabetic chronic kidney disease: Secondary | ICD-10-CM | POA: Diagnosis not present

## 2021-07-23 DIAGNOSIS — R809 Proteinuria, unspecified: Secondary | ICD-10-CM | POA: Diagnosis not present

## 2021-07-23 DIAGNOSIS — N2581 Secondary hyperparathyroidism of renal origin: Secondary | ICD-10-CM | POA: Diagnosis not present

## 2021-07-23 DIAGNOSIS — N184 Chronic kidney disease, stage 4 (severe): Secondary | ICD-10-CM | POA: Diagnosis not present

## 2021-07-23 DIAGNOSIS — E872 Acidosis, unspecified: Secondary | ICD-10-CM | POA: Diagnosis not present

## 2021-07-25 DIAGNOSIS — N179 Acute kidney failure, unspecified: Secondary | ICD-10-CM | POA: Insufficient documentation

## 2021-07-25 DIAGNOSIS — R809 Proteinuria, unspecified: Secondary | ICD-10-CM | POA: Diagnosis not present

## 2021-07-25 DIAGNOSIS — N2581 Secondary hyperparathyroidism of renal origin: Secondary | ICD-10-CM | POA: Diagnosis not present

## 2021-07-25 DIAGNOSIS — N184 Chronic kidney disease, stage 4 (severe): Secondary | ICD-10-CM | POA: Diagnosis not present

## 2021-07-25 DIAGNOSIS — E1122 Type 2 diabetes mellitus with diabetic chronic kidney disease: Secondary | ICD-10-CM | POA: Diagnosis not present

## 2021-07-25 DIAGNOSIS — E875 Hyperkalemia: Secondary | ICD-10-CM | POA: Diagnosis not present

## 2021-07-25 DIAGNOSIS — I129 Hypertensive chronic kidney disease with stage 1 through stage 4 chronic kidney disease, or unspecified chronic kidney disease: Secondary | ICD-10-CM | POA: Diagnosis not present

## 2021-07-26 ENCOUNTER — Other Ambulatory Visit: Payer: Self-pay | Admitting: Internal Medicine

## 2021-08-03 DIAGNOSIS — H40003 Preglaucoma, unspecified, bilateral: Secondary | ICD-10-CM | POA: Diagnosis not present

## 2021-08-10 ENCOUNTER — Other Ambulatory Visit: Payer: Self-pay

## 2021-08-10 DIAGNOSIS — K219 Gastro-esophageal reflux disease without esophagitis: Secondary | ICD-10-CM

## 2021-08-10 MED ORDER — OMEPRAZOLE 20 MG PO CPDR: 20.0000 mg | DELAYED_RELEASE_CAPSULE | Freq: Two times a day (BID) | ORAL | 2 refills | Status: DC

## 2021-08-13 ENCOUNTER — Other Ambulatory Visit: Payer: Self-pay | Admitting: Family Medicine

## 2021-08-13 DIAGNOSIS — F5101 Primary insomnia: Secondary | ICD-10-CM

## 2021-08-13 NOTE — Telephone Encounter (Signed)
Requested medication (s) are due for refill today - yes  Requested medication (s) are on the active medication list -yes  Future visit scheduled -yes  Last refill: 04/13/21 #30 3 RF  Notes to clinic: Request RF: non delegated Rx  Requested Prescriptions  Pending Prescriptions Disp Refills   temazepam (RESTORIL) 30 MG capsule [Pharmacy Med Name: TEMAZEPAM 30 MG CAP] 30 capsule     Sig: TAKE 1 CAPSULE BY MOUTH AT BEDTIME AS NEEDED SLEEP     Not Delegated - Psychiatry:  Anxiolytics/Hypnotics Failed - 08/13/2021  9:43 AM      Failed - This refill cannot be delegated      Failed - Urine Drug Screen completed in last 360 days      Passed - Valid encounter within last 6 months    Recent Outpatient Visits           1 month ago Benign hypertension with CKD (chronic kidney disease) stage IV ([place])   [facility], Devonne Doughty, DO   7 months ago Benign hypertension with CKD (chronic kidney disease) stage IV ([place])   [facility] Olin Hauser, DO   1 year ago Benign hypertension with CKD (chronic kidney disease) stage IV ([place])   [facility] Olin Hauser, DO   1 year ago Primary osteoarthritis involving multiple joints   [facility] [place], Devonne Doughty, DO   1 year ago Acute respiratory failure with hypoxia [facility])   [place], DO       Future Appointments             In 4 months [place], Devonne Doughty, DO [facility], [place]   In 7 months  [facility], [place]               Requested Prescriptions  Pending Prescriptions Disp Refills   temazepam (RESTORIL) 30 MG capsule [Pharmacy Med Name: TEMAZEPAM 30 MG CAP] 30 capsule     Sig: TAKE 1 CAPSULE BY MOUTH AT BEDTIME AS NEEDED SLEEP     Not Delegated - Psychiatry:  Anxiolytics/Hypnotics Failed - 08/13/2021  9:43 AM      Failed - This refill cannot be delegated       Failed - Urine Drug Screen completed in last 360 days      Passed - Valid encounter within last 6 months    Recent Outpatient Visits           1 month ago Benign hypertension with CKD (chronic kidney disease) stage IV [facility])   [facility], Devonne Doughty, DO   7 months ago Benign hypertension with CKD (chronic kidney disease) stage IV [facility])   [facility] Olin Hauser, DO   1 year ago Benign hypertension with CKD (chronic kidney disease) stage IV [facility])   [facility] Olin Hauser, DO   1 year ago Primary osteoarthritis involving multiple joints   [place], DO   1 year ago Acute respiratory failure with hypoxia [facility])   [facility] [place], Devonne Doughty, DO       Future Appointments             In 4 months Parks Ranger, Devonne Doughty, [facility], [place]   In 7 months  [facility], [facility]

## 2021-08-30 ENCOUNTER — Ambulatory Visit (INDEPENDENT_AMBULATORY_CARE_PROVIDER_SITE_OTHER): Payer: Medicare Other

## 2021-08-30 DIAGNOSIS — I442 Atrioventricular block, complete: Secondary | ICD-10-CM

## 2021-08-30 LAB — CUP PACEART REMOTE DEVICE CHECK
Battery Remaining Longevity: 6 mo
Battery Voltage: 2.87 V
Brady Statistic AP VP Percent: 99.97 %
Brady Statistic AP VS Percent: 0 %
Brady Statistic AS VP Percent: 0.03 %
Brady Statistic AS VS Percent: 0 %
Brady Statistic RA Percent Paced: 99.97 %
Brady Statistic RV Percent Paced: 100 %
Date Time Interrogation Session: 20230126115430
Implantable Lead Implant Date: 20141217
Implantable Lead Implant Date: 20141217
Implantable Lead Location: 753859
Implantable Lead Location: 753860
Implantable Lead Model: 5076
Implantable Lead Model: 5076
Implantable Pulse Generator Implant Date: 20141217
Lead Channel Impedance Value: 418 Ohm
Lead Channel Impedance Value: 437 Ohm
Lead Channel Impedance Value: 494 Ohm
Lead Channel Impedance Value: 513 Ohm
Lead Channel Pacing Threshold Amplitude: 0.375 V
Lead Channel Pacing Threshold Amplitude: 0.75 V
Lead Channel Pacing Threshold Pulse Width: 0.4 ms
Lead Channel Pacing Threshold Pulse Width: 0.4 ms
Lead Channel Sensing Intrinsic Amplitude: 17.375 mV
Lead Channel Sensing Intrinsic Amplitude: 17.375 mV
Lead Channel Sensing Intrinsic Amplitude: 3.875 mV
Lead Channel Sensing Intrinsic Amplitude: 3.875 mV
Lead Channel Setting Pacing Amplitude: 1.75 V
Lead Channel Setting Pacing Amplitude: 2.5 V
Lead Channel Setting Pacing Pulse Width: 0.4 ms
Lead Channel Setting Sensing Sensitivity: 0.9 mV

## 2021-09-10 NOTE — Progress Notes (Signed)
Remote pacemaker transmission.   

## 2021-09-12 DIAGNOSIS — I129 Hypertensive chronic kidney disease with stage 1 through stage 4 chronic kidney disease, or unspecified chronic kidney disease: Secondary | ICD-10-CM | POA: Diagnosis not present

## 2021-09-12 DIAGNOSIS — N179 Acute kidney failure, unspecified: Secondary | ICD-10-CM | POA: Diagnosis not present

## 2021-09-12 DIAGNOSIS — N2581 Secondary hyperparathyroidism of renal origin: Secondary | ICD-10-CM | POA: Diagnosis not present

## 2021-09-12 DIAGNOSIS — E1122 Type 2 diabetes mellitus with diabetic chronic kidney disease: Secondary | ICD-10-CM | POA: Diagnosis not present

## 2021-09-12 DIAGNOSIS — R809 Proteinuria, unspecified: Secondary | ICD-10-CM | POA: Diagnosis not present

## 2021-09-12 DIAGNOSIS — N1832 Chronic kidney disease, stage 3b: Secondary | ICD-10-CM | POA: Diagnosis not present

## 2021-09-12 DIAGNOSIS — N184 Chronic kidney disease, stage 4 (severe): Secondary | ICD-10-CM | POA: Diagnosis not present

## 2021-09-12 DIAGNOSIS — E875 Hyperkalemia: Secondary | ICD-10-CM | POA: Diagnosis not present

## 2021-09-12 LAB — MICROALBUMIN, URINE: Microalb, Ur: <30

## 2021-09-13 ENCOUNTER — Telehealth: Payer: Self-pay

## 2021-09-13 DIAGNOSIS — R252 Cramp and spasm: Secondary | ICD-10-CM

## 2021-09-13 MED ORDER — BACLOFEN 5 MG PO TABS: 5.0000 mg | ORAL_TABLET | Freq: Every day | ORAL | 3 refills | Status: DC

## 2021-09-13 NOTE — Telephone Encounter (Signed)
Copied from [place] [id number]. Topic: General - Inquiry >> Sep 13, 2021 10:33 AM Loma [place] wrote: Reason for CRM: This pt was absolutely insistent that her nurse calls in this [facility] to [facility], states has refills but she has too hard of a time getting thru.  States that she is not able to key in all the numbers in the time frame they give her. She wants her nurse to call it in for her. Actually she says the nurse can fax it over for her?  FU [phone] that it is done? Baclofen 5 MG TABS 90 tablet 3 03/08/2021   Sig - Route: Take 5 mg by mouth at bedtime. - Oral  Sent to pharmacy as: Baclofen 5 MG Tab  E-Prescribing Status: Receipt confirmed by pharmacy (03/08/2021 1:17 PM EDT

## 2021-09-18 DIAGNOSIS — R809 Proteinuria, unspecified: Secondary | ICD-10-CM | POA: Diagnosis not present

## 2021-09-18 DIAGNOSIS — M109 Gout, unspecified: Secondary | ICD-10-CM | POA: Diagnosis not present

## 2021-09-18 DIAGNOSIS — N184 Chronic kidney disease, stage 4 (severe): Secondary | ICD-10-CM | POA: Diagnosis not present

## 2021-09-18 DIAGNOSIS — E1122 Type 2 diabetes mellitus with diabetic chronic kidney disease: Secondary | ICD-10-CM | POA: Diagnosis not present

## 2021-09-18 DIAGNOSIS — I129 Hypertensive chronic kidney disease with stage 1 through stage 4 chronic kidney disease, or unspecified chronic kidney disease: Secondary | ICD-10-CM | POA: Diagnosis not present

## 2021-09-18 DIAGNOSIS — N2581 Secondary hyperparathyroidism of renal origin: Secondary | ICD-10-CM | POA: Diagnosis not present

## 2021-09-18 DIAGNOSIS — E8722 Chronic metabolic acidosis: Secondary | ICD-10-CM | POA: Diagnosis not present

## 2021-09-18 DIAGNOSIS — E875 Hyperkalemia: Secondary | ICD-10-CM | POA: Diagnosis not present

## 2021-09-19 DIAGNOSIS — E1122 Type 2 diabetes mellitus with diabetic chronic kidney disease: Secondary | ICD-10-CM | POA: Diagnosis not present

## 2021-09-19 DIAGNOSIS — E1159 Type 2 diabetes mellitus with other circulatory complications: Secondary | ICD-10-CM | POA: Diagnosis not present

## 2021-09-19 DIAGNOSIS — I10 Essential (primary) hypertension: Secondary | ICD-10-CM | POA: Diagnosis not present

## 2021-09-19 DIAGNOSIS — N1832 Chronic kidney disease, stage 3b: Secondary | ICD-10-CM | POA: Diagnosis not present

## 2021-10-11 ENCOUNTER — Other Ambulatory Visit: Payer: Self-pay | Admitting: Family Medicine

## 2021-10-11 DIAGNOSIS — G8929 Other chronic pain: Secondary | ICD-10-CM

## 2021-10-11 DIAGNOSIS — M159 Polyosteoarthritis, unspecified: Secondary | ICD-10-CM

## 2021-10-11 DIAGNOSIS — M5442 Lumbago with sciatica, left side: Secondary | ICD-10-CM

## 2021-10-11 NOTE — Telephone Encounter (Signed)
Requested medication (s) are due for refill today: yes ? ?Requested medication (s) are on the active medication list: yes ? ?Last refill:  04/27/21 #600 ? ?Future visit scheduled: yes ? ?Notes to clinic:  Unable to refill per protocol, cannot delegate. ? ? ? ?  ?Requested Prescriptions  ?Pending Prescriptions Disp Refills  ? traMADol (ULTRAM) 50 MG tablet [Pharmacy Med Name: TRAMADOL HCL 50 MG TAB] 60 tablet   ?  Sig: TAKE 1 TABLET BY MOUTH TWICE DAILY  ?  ? Not Delegated - Analgesics:  Opioid Agonists Failed - 10/11/2021 11:05 AM  ?  ?  Failed - This refill cannot be delegated  ?  ?  Failed - Urine Drug Screen completed in last 360 days  ?  ?  Failed - Valid encounter within last 3 months  ?  Recent Outpatient Visits   ? ?      ? 3 months ago 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)  ? [place], DO  ? 9 months ago Benign hypertension with CKD (chronic kidney disease) stage IV ([place])  ? [place], DO  ? 1 year ago Benign hypertension with CKD (chronic kidney disease) stage IV ([place])  ? [place], DO  ? 1 year ago Primary osteoarthritis involving multiple joints  ? [place], DO  ? 2 years ago Acute respiratory failure with hypoxia [facility])  ? [place], DO  ? ?  ?  ?Future Appointments   ? ?        ? In 2 months Parks Ranger, [facility], [place]  ? In 5 months  [facility] [place], [place]  ? ?  ? ?  ?  ?  ? ?

## 2021-10-12 ENCOUNTER — Other Ambulatory Visit: Payer: Self-pay | Admitting: Family Medicine

## 2021-10-12 DIAGNOSIS — E039 Hypothyroidism, unspecified: Secondary | ICD-10-CM

## 2021-10-12 MED ORDER — LEVOTHYROXINE SODIUM 25 MCG PO TABS: 25.0000 ug | ORAL_TABLET | Freq: Every day | ORAL | 1 refills | Status: DC

## 2021-10-29 ENCOUNTER — Telehealth: Payer: Self-pay | Admitting: Internal Medicine

## 2021-10-29 NOTE — Progress Notes (Signed)
? ? ? ? ?Patient Care Team: ?Olin Hauser, DO as PCP - General (Family Medicine) ?Lavonia Dana, MD as Consulting Physician (Nephrology) ?Deboraha Sprang, MD as Consulting Physician (Cardiology) ?Edrick Kins, DPM as Consulting Physician (Podiatry) ?Vanita Ingles, RN as Case Manager (General Practice) ? ? ?HPI ? ?Renee Boyd is a 71 y.o. female ?Seen in followup for largely asymptomatic persistent atrial fibrillation.  She transferred care to here 7/17.  At that time, anticoagulation was initiated w Apixoban   ? ?She is s/p pacemaker insertion Medtronic 2014 for sinus node dysfunction and CHB  ? ?Previously exposed to [place] when started [employer].  ? ?Underwent cardioversion 7/21 with significant improvement in symptoms.  She reverted back to atrial fib/flutter shortly after her last office visit.  Anticipated repeat cardioversion following reloading with amiodarone to assess benefit of restoring sinus rhythm done 3/22 ? ?She is seen today as an add-on because of nausea and vomiting which has been problematic for about a month associated with anorexia and some weight loss.  She fell in December, ended up taking NSAIDs with a significant interval worsening in her creatinine.  NSAIDs were stopped and there was some interval improvement  ? ?Has had some lightheadedness, decreased amlodipine per her PCP/renal associated with chest pain and so she resumed it.  Still with some lightheadedness but resolution of the chest pain. ? ? ? ?Records and Results Reviewed ?Date Cr K Hgb LFTs TSH  ?10/17 1.36  8.8  0.304  ?2/18 1.73 4.9   0.6  ?6/18  1.3  11.1    ?1/20 1.2 4.3 11.3    ?1/21 1.32 3.2 9.6 48   ?7/21 2.37 4.2 12.4 38 0.59  ?8/21 2.64 (CE)      ?12/21 2.64 (CE)       ?3/22 1.9. 4.5 12.1 28 0.642  ?9/22 1.78 4.4 12.0    ?11/22 2.35 4.2  53   ?2/23 2.53 4.4     ? ?DATE TEST EF   ?7/17 Echo   60-65 % Pulm HTN 64 (no E/E')  ?12/19  Echo  50-55% LVH severe (17/16)   ?PAsys 50-55 ?  MR mild  LAE(37/2.0/34)  ?1/20 CTA  CAD 2V  ? FFR > 0.8 LAD/RCA  ?5/22 Myoview  75% No ischemia   ?     ? ? . ?  ?Thromboembolic risk factors ( age -25, HTN-1, TIA/CVA-2, DM-1 , Gender-1) for a CHADSVASc Score of 6 ? ?  ? ?Past Medical History:  ?Diagnosis Date  ? Complete heart block ([place])   ? a. s/p MDT PPM 2006 with device generator replacement 2014; b. followed by Dr. Caryl Comes  ? Diabetes ([place])   ? GERD (gastroesophageal reflux disease)   ? Hypertension   ? Hypothyroid   ? Pacemaker   ? Persistent atrial fibrillation ([place])   ? a. on Eliquis; b. CHADS2VASc 6 (HTN. age x 1, DM, TIA x 2, female); c. s/p DCCV 05/28/18  ? Pulmonary hypertension ([place])   ? a. echo 2017: EF of 60-65%, no RWMA, mildly dilated left atrium, RVSF normal, PASP 64 mmHg  ? TIA (transient ischemic attack)   ? ? ?Past Surgical History:  ?Procedure Laterality Date  ? ABDOMINAL HYSTERECTOMY    ? CARDIOVERSION N/A 05/28/2018  ? Procedure: CARDIOVERSION;  Surgeon: Minna Merritts, MD;  Location: [facility] ORS;  Service: Cardiovascular;  Laterality: N/A;  ? CARDIOVERSION N/A 03/01/2020  ? Procedure: CARDIOVERSION;  Surgeon: Kate Sable, MD;  Location:  [facility] ORS;  Service: Cardiovascular;  Laterality: N/A;  ? CARDIOVERSION N/A 10/25/2020  ? Procedure: CARDIOVERSION;  Surgeon: Kate Sable, MD;  Location: [facility] ORS;  Service: Cardiovascular;  Laterality: N/A;  ? CHOLECYSTECTOMY    ? TONSILLECTOMY    ? TUBAL LIGATION    ? ? ?Current Outpatient Medications  ?Medication Sig Dispense Refill  ? acetaminophen (TYLENOL) 500 MG tablet Take 1,000 mg by mouth every 8 (eight) hours as needed.    ? allopurinol (ZYLOPRIM) 100 MG tablet Take 1 tablet (100 mg total) by mouth daily. 90 tablet 3  ? amiodarone (PACERONE) 200 MG tablet TAKE ONE AND ONE-HALF TABLETS (300 MG) ONCE DAILY (DOSE DECREASE) 135 tablet 3  ? amLODipine (NORVASC) 10 MG tablet TAKE 1 TABLET DAILY 90 tablet 3  ? apixaban (ELIQUIS) 5 MG TABS tablet Take 1 tablet (5 mg total) by mouth 2 (two) times daily.  180 tablet 1  ? ascorbic acid (VITAMIN C) 1000 MG tablet Take 1,000 mg by mouth daily.    ? atorvastatin (LIPITOR) 20 MG tablet TAKE 1 TABLET DAILY 90 tablet 3  ? Baclofen 5 MG TABS Take 5 mg by mouth at bedtime. 90 tablet 3  ? COMBIVENT RESPIMAT 20-100 MCG/ACT AERS respimat INHALE 1 PUFF EVERY 6 HOURS AS NEEDED WHEEZING/ SHORTNESS OF BREATH 4 g 2  ? CONTOUR NEXT TEST test strip Use to check blood sugar up to twice a day. 200 each 5  ? desonide (DESOWEN) 0.05 % lotion Apply topically 2 (two) times daily. 59 mL 0  ? Dulaglutide 3 MG/0.5ML SOPN Inject into the skin every Tuesday.    ? furosemide (LASIX) 20 MG tablet TAKE 3 TABLETS ONCE DAILY (DOSE INCREASE) 270 tablet 3  ? JARDIANCE 10 MG TABS tablet Take 10 mg by mouth daily.    ? levothyroxine (SYNTHROID) 25 MCG tablet Take 1 tablet (25 mcg total) by mouth daily before breakfast. 90 tablet 1  ? Multiple Vitamin (MULTIVITAMIN) capsule Take 1 capsule by mouth daily.    ? omeprazole (PRILOSEC) 20 MG capsule Take 1 capsule (20 mg total) by mouth 2 (two) times daily before a meal. 180 capsule 2  ? temazepam (RESTORIL) 30 MG capsule TAKE 1 CAPSULE BY MOUTH AT BEDTIME AS NEEDED SLEEP 30 capsule 2  ? traMADol (ULTRAM) 50 MG tablet Take 1 tablet (50 mg total) by mouth 2 (two) times daily. (Patient taking differently: Take 50 mg by mouth every 12 (twelve) hours as needed.) 60 tablet 2  ? lisinopril (ZESTRIL) 20 MG tablet Take 0.5 tablet (10 mg) by mouth once daily 90 tablet 3  ? metoprolol succinate (TOPROL-XL) 100 MG 24 hr tablet Take 0.5 tablet (50 mg) by mouth once daily. Take with or immediately following a meal. 90 tablet 3  ? ?No current facility-administered medications for this visit.  ? ? ?Allergies  ?Allergen Reactions  ? Morphine And Related Itching  ? Neosporin [Bacitracin-Polymyxin B]   ? Other   ?  glue  ? Percocet [Oxycodone-Acetaminophen] Other (See Comments)  ?  Hallucinations   ? ? ? ? ?Review of Systems negative except from HPI and PMH ? ?Physical  Exam ? ?BP 128/82 (BP Location: Right Arm, Patient Position: Sitting, Cuff Size: Normal)   Pulse 69   Ht '4\' 11"'$  (1.499 m)   Wt 150 lb (68 kg)   SpO2 95%   BMI 30.30 kg/m?  ? ?Well developed and well nourished in no acute distress ?HENT normal ?Neck supple with JVP-flat ?Clear ?Device pocket well  healed; without hematoma or erythema.  There is no tethering  ?Regular rate and rhythm, no  gallop No  murmur ?Abd-soft with active BS ?No Clubbing cyanosis  edema ?Skin-warm and dry ?A & Oriented  Grossly normal sensory and motor function ? ?ECG AV pacing at 69 ? ? ?Assessment and  Plan ? ?Nausea and vomiting ? ?Atrial fibrillation/flutter long-term persistent ? ?Ventricular tachycardia-nonsustained ? ?HFpEF chronic ? ?Hypertension  ? ?Pacemaker  Medtronic  ? ?Sinus arrest ? ?Complete heart block ? 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 ?High Risk Medication Surveillance-amiodarone ? ?Elevated transaminases ? ?Orthostatic hypotension ? ?PVCs ? ?Sleep disordered breathing and daytime somnolence averse to a sleep study ? ? ?Atrial fibrillation is quiescient.  With the nausea  , it is possible that is related to the amiodarone.  We will hold it.  I am not familiar with amiodarone causing vomiting.  We will check her transaminases which are elevated previously.  Perhaps.  There may also be a primary GI issue.  We will check her lipase and her amylase. ? ?I also worry about her renal function.  We will recheck her creatinine. ? ?With her orthostatic hypotension, which could be aggravated by amiodarone, we will also cut her metoprolol in half as well as her lisinopril in half.  She is averse to decreasing her amlodipine ? ? ? ?

## 2021-10-29 NOTE — Telephone Encounter (Signed)
STAT if HR is under 50 or over 120 ?(normal HR is 60-100 beats per minute) ? ?What is your heart rate? no ? ?Do you have a log of your heart rate readings (document readings)? no ? ?Do you have any other symptoms? Mention HR goes up & down with no readings complaining of nausea as a symptom ?

## 2021-10-29 NOTE — Telephone Encounter (Signed)
I called and spoke with the patient. ?She reports that she has had intermittent episodes of nausea and vomiting over the last 2 weeks. ? ?She denies any fevers/ chest pain/ dizziness/ lightheadedness at this time. ? ?She does confirm today that she passed out twice in December after she received her COVID booster.  ?One episode was noted when she was in the kitchen and taking the bread tie off her bread. ?She states she does not remember falling but woke up on the floor with her backside. ?She states she did not hit her head.  ? ?She ended up seeing Dr. Juleen China in December and reported these events to him. ?She was advised at that time that her BP dropped significantly with standing so her amlodipine was decreased to 5 mg once daily. ?She states that after the dose was decreased, she started to develop chest pain, so she increased the amlodipine back to 10 mg once daily on her own. ?The patient confirms that after she increased the amlodipine dose back up, her chest pain resolved.  ?She did have another follow up with Dr. Juleen China and made him aware of this.  ? ?She is concerned about her ongoing nausea/ vomiting. ? ?I have confirmed with that patient that she is still taking amiodarone 300 mg once daily. ? ?I have advised that I am unsure if the higher amiodarone dose might be causing her symptoms, but would like her to come in to see Dr. Caryl Comes to for further evaluation.  ?She is agreeable with an appointment tomorrow. ?She is aware we can interrogate her device at that time and she may need repeat lab work. ?Will obtain orthostatic vital signs at that time.  ? ?The patient voices understanding and is agreeable. ? ?I have scheduled her to come in at 8:20 am on 3/28 with Dr. Caryl Comes. ? ? ?

## 2021-10-30 ENCOUNTER — Other Ambulatory Visit: Payer: Self-pay

## 2021-10-30 ENCOUNTER — Encounter: Payer: Self-pay | Admitting: Internal Medicine

## 2021-10-30 ENCOUNTER — Ambulatory Visit (INDEPENDENT_AMBULATORY_CARE_PROVIDER_SITE_OTHER): Payer: Medicare Other | Admitting: Internal Medicine

## 2021-10-30 VITALS — BP 128/82 | HR 69 | Ht 59.0 in | Wt 150.0 lb

## 2021-10-30 DIAGNOSIS — I4729 Other ventricular tachycardia: Secondary | ICD-10-CM

## 2021-10-30 DIAGNOSIS — I10 Essential (primary) hypertension: Secondary | ICD-10-CM | POA: Diagnosis not present

## 2021-10-30 DIAGNOSIS — I442 Atrioventricular block, complete: Secondary | ICD-10-CM | POA: Diagnosis not present

## 2021-10-30 DIAGNOSIS — Z950 Presence of cardiac pacemaker: Secondary | ICD-10-CM

## 2021-10-30 DIAGNOSIS — Z79899 Other long term (current) drug therapy: Secondary | ICD-10-CM

## 2021-10-30 DIAGNOSIS — I4819 Other persistent atrial fibrillation: Secondary | ICD-10-CM

## 2021-10-30 LAB — PACEMAKER DEVICE OBSERVATION

## 2021-10-30 MED ORDER — LISINOPRIL 20 MG PO TABS: ORAL_TABLET | ORAL | 3 refills | Status: DC

## 2021-10-30 MED ORDER — METOPROLOL SUCCINATE ER 100 MG PO TB24: ORAL_TABLET | ORAL | 3 refills | Status: DC

## 2021-10-30 NOTE — Patient Instructions (Signed)
Medication Instructions:  ?- Your physician has recommended you make the following change in your medication:  ? ?1) HOLD Amiodarone for now ? ?2) DECREASE Lisinopril 20 mg: ?- take 0.5 tablet (10 mg) by mouth once daily  ? ?3) DECREASE Metoprolol Succinate 100 mg: ?- take 0.5 tablet (50 mg) by mouth once daily  ? ?*If you need a refill on your cardiac medications before your next appointment, please call your pharmacy* ? ? ?Lab Work: ?- Your physician recommends that you have lab work today: CMET/ Amylase/ Lipase ? ?If you have labs (blood work) drawn today and your tests are completely normal, you will receive your results only by: ?MyChart Message (if you have MyChart) OR ?A paper copy in the mail ?If you have any lab test that is abnormal or we need to change your treatment, we will call you to review the results. ? ? ?Testing/Procedures: ?- none ordered ? ? ?Follow-Up: ?At [facility], you and your health needs are our priority.  As part of our continuing mission to provide you with exceptional heart care, we have created designated Provider Care Teams.  These Care Teams include your primary Cardiologist (physician) and Advanced Practice Providers (APPs -  Physician Assistants and Nurse Practitioners) who all work together to provide you with the care you need, when you need it. ? ?We recommend signing up for the patient portal called "MyChart".  Sign up information is provided on this After Visit Summary.  MyChart is used to connect with patients for Virtual Visits (Telemedicine).  Patients are able to view lab/test results, encounter notes, upcoming appointments, etc.  Non-urgent messages can be sent to your provider as well.   ?To learn more about what you can do with MyChart, go to [email or web address].   ? ?Your next appointment:   ?2 month(s) ? ?The format for your next appointment:   ?In Person ? ?Provider:   ?Virl Axe, MD  ? ? ?Other Instructions ?N/a ? ?

## 2021-10-31 LAB — AMYLASE: Amylase: 132 U/L — ABNORMAL HIGH (ref 31–110)

## 2021-10-31 LAB — LIPASE: Lipase: 35 U/L (ref 14–85)

## 2021-10-31 LAB — COMPREHENSIVE METABOLIC PANEL
ALT: 36 IU/L — ABNORMAL HIGH (ref 0–32)
AST: 31 IU/L (ref 0–40)
Albumin/Globulin Ratio: 1.4 (ref 1.2–2.2)
Albumin: 4.3 g/dL (ref 3.7–4.7)
Alkaline Phosphatase: 88 IU/L (ref 44–121)
BUN/Creatinine Ratio: 11 — ABNORMAL LOW (ref 12–28)
BUN: 34 mg/dL — ABNORMAL HIGH (ref 8–27)
Bilirubin Total: <0.2 mg/dL (ref 0.0–1.2)
CO2: 19 mmol/L — ABNORMAL LOW (ref 20–29)
Calcium: 9.8 mg/dL (ref 8.7–10.3)
Chloride: 100 mmol/L (ref 96–106)
Creatinine, Ser: 3.16 mg/dL — ABNORMAL HIGH (ref 0.57–1.00)
Globulin, Total: 3.1 g/dL (ref 1.5–4.5)
Glucose: 121 mg/dL — ABNORMAL HIGH (ref 70–99)
Potassium: 5 mmol/L (ref 3.5–5.2)
Sodium: 146 mmol/L — ABNORMAL HIGH (ref 134–144)
Total Protein: 7.4 g/dL (ref 6.0–8.5)
eGFR: 15 mL/min/{1.73_m2} — ABNORMAL LOW (ref >59)

## 2021-11-01 ENCOUNTER — Telehealth: Payer: Self-pay | Admitting: Internal Medicine

## 2021-11-01 NOTE — Telephone Encounter (Signed)
Reviewed the patient's most recent lab work with Dr. Caryl Comes dated 10/30/21. ?Creatinine is up to 3.16 ?BUN is 34 ? ?Amylase is 132. ? ?Per Dr. Caryl Comes, orders received to: ?1) Stop lisinopril ?2) Follow up with Dr. Juleen China (Renal) next week ?3) Nothing clinically significant with amylase as lipase is normal. ? ?Attempted to call the patient. ?No answer- No voice mail set up. ? ?Will try to reach the patient again in the morning.  ? ? ?

## 2021-11-02 NOTE — Telephone Encounter (Signed)
?  Pt requesting to speak with the nurse again, she would like to discuss again about her result. She said last night the TV was on and did not understand most of the information given to her  ?

## 2021-11-02 NOTE — Telephone Encounter (Signed)
I spoke with the patient. ?I have advised her of her lab results. ?She is aware of Dr. Olin Pia recommendations to as stated below to: ?- stop lisinopril ?- follow up with Dr. Juleen China in the next week ? ?She states that since she saw [place] in the office on Tuesday, she only had 1 episode of nausea/ vomiting yesterday.  ? ?I have asked her to call me back in the next 2 weeks if nausea/ vomiting persist. ?She is aware that the amiodarone may be causing this for her, but will take some time to completely clear her system. ? ?If she has continued nausea/ vomiting, she is aware she may need to follow up with Dr. Parks Ranger.  ? ?The patient voices understanding of the above and is agreeable. ?She states she already took her dose of lisinopril this morning, but will start holding this tomorrow. ? ? ?

## 2021-11-09 LAB — CUP PACEART INCLINIC DEVICE CHECK
Battery Remaining Longevity: 5 mo
Battery Voltage: 2.85 V
Brady Statistic AP VP Percent: 99.98 %
Brady Statistic AP VS Percent: 0 %
Brady Statistic AS VP Percent: 0.02 %
Brady Statistic AS VS Percent: 0 %
Brady Statistic RA Percent Paced: 99.98 %
Brady Statistic RV Percent Paced: 100 %
Date Time Interrogation Session: 20230328085600
Implantable Lead Implant Date: 20141217
Implantable Lead Implant Date: 20141217
Implantable Lead Location: 753859
Implantable Lead Location: 753860
Implantable Lead Model: 5076
Implantable Lead Model: 5076
Implantable Pulse Generator Implant Date: 20141217
Lead Channel Impedance Value: 437 Ohm
Lead Channel Impedance Value: 456 Ohm
Lead Channel Impedance Value: 551 Ohm
Lead Channel Impedance Value: 570 Ohm
Lead Channel Pacing Threshold Amplitude: 0.375 V
Lead Channel Pacing Threshold Amplitude: 0.75 V
Lead Channel Pacing Threshold Pulse Width: 0.4 ms
Lead Channel Pacing Threshold Pulse Width: 0.4 ms
Lead Channel Sensing Intrinsic Amplitude: 17.375 mV
Lead Channel Sensing Intrinsic Amplitude: 17.375 mV
Lead Channel Sensing Intrinsic Amplitude: 3.125 mV
Lead Channel Sensing Intrinsic Amplitude: 3.125 mV
Lead Channel Setting Pacing Amplitude: 1.75 V
Lead Channel Setting Pacing Amplitude: 2.5 V
Lead Channel Setting Pacing Pulse Width: 0.4 ms
Lead Channel Setting Sensing Sensitivity: 0.9 mV

## 2021-11-12 ENCOUNTER — Other Ambulatory Visit: Payer: Self-pay | Admitting: Family Medicine

## 2021-11-12 DIAGNOSIS — F5101 Primary insomnia: Secondary | ICD-10-CM

## 2021-11-13 NOTE — Telephone Encounter (Signed)
Requested medication (s) are due for refill today: yes ? ?Requested medication (s) are on the active medication list: yes ? ?Last refill:  08/13/21 #30 with 2 RF ? ?Future visit scheduled: 12/25/21 ? ?Notes to clinic:  This medication can not be delegated, please assess.  ? ? ? ?  ? ?Requested Prescriptions  ?Pending Prescriptions Disp Refills  ? temazepam (RESTORIL) 30 MG capsule [Pharmacy Med Name: TEMAZEPAM 30 MG CAP] 30 capsule   ?  Sig: TAKE 1 CAPSULE BY MOUTH AT BEDTIME AS NEEDED SLEEP  ?  ? Not Delegated - Psychiatry: Anxiolytics/Hypnotics 2 Failed - 11/12/2021 10:56 AM  ?  ?  Failed - This refill cannot be delegated  ?  ?  Failed - Urine Drug Screen completed in last 360 days  ?  ?  Passed - Patient is not pregnant  ?  ?  Passed - Valid encounter within last 6 months  ?  Recent Outpatient Visits   ? ?      ? 4 months ago 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)  ? [place], DO  ? 10 months ago Benign hypertension with CKD (chronic kidney disease) stage IV ([place])  ? [place], DO  ? 1 year ago Benign hypertension with CKD (chronic kidney disease) stage IV ([place])  ? [place], DO  ? 1 year ago Primary osteoarthritis involving multiple joints  ? [place], DO  ? 2 years ago Acute respiratory failure with hypoxia [facility])  ? [place], DO  ? ?  ?  ?Future Appointments   ? ?        ? In 1 month Karamalegos, Devonne Doughty, DO [facility], [place]  ? In 4 months  [facility], [place]  ? ?  ? ?  ?  ?  ? ? ?

## 2021-11-29 ENCOUNTER — Ambulatory Visit (INDEPENDENT_AMBULATORY_CARE_PROVIDER_SITE_OTHER): Payer: Medicare Other | Admitting: Podiatry

## 2021-11-29 ENCOUNTER — Ambulatory Visit (INDEPENDENT_AMBULATORY_CARE_PROVIDER_SITE_OTHER): Payer: Medicare Other

## 2021-11-29 DIAGNOSIS — L603 Nail dystrophy: Secondary | ICD-10-CM | POA: Diagnosis not present

## 2021-11-29 DIAGNOSIS — I442 Atrioventricular block, complete: Secondary | ICD-10-CM

## 2021-11-29 LAB — CUP PACEART REMOTE DEVICE CHECK
Battery Remaining Longevity: 3 mo
Battery Voltage: 2.85 V
Brady Statistic AP VP Percent: 99.98 %
Brady Statistic AP VS Percent: 0 %
Brady Statistic AS VP Percent: 0.02 %
Brady Statistic AS VS Percent: 0 %
Brady Statistic RA Percent Paced: 99.95 %
Brady Statistic RV Percent Paced: 100 %
Date Time Interrogation Session: 20230427101942
Implantable Lead Implant Date: 20141217
Implantable Lead Implant Date: 20141217
Implantable Lead Location: 753859
Implantable Lead Location: 753860
Implantable Lead Model: 5076
Implantable Lead Model: 5076
Implantable Pulse Generator Implant Date: 20141217
Lead Channel Impedance Value: 437 Ohm
Lead Channel Impedance Value: 437 Ohm
Lead Channel Impedance Value: 532 Ohm
Lead Channel Impedance Value: 551 Ohm
Lead Channel Pacing Threshold Amplitude: 0.375 V
Lead Channel Pacing Threshold Amplitude: 0.75 V
Lead Channel Pacing Threshold Pulse Width: 0.4 ms
Lead Channel Pacing Threshold Pulse Width: 0.4 ms
Lead Channel Sensing Intrinsic Amplitude: 17.375 mV
Lead Channel Sensing Intrinsic Amplitude: 17.375 mV
Lead Channel Sensing Intrinsic Amplitude: 3.375 mV
Lead Channel Sensing Intrinsic Amplitude: 3.375 mV
Lead Channel Setting Pacing Amplitude: 1.75 V
Lead Channel Setting Pacing Amplitude: 2.5 V
Lead Channel Setting Pacing Pulse Width: 0.4 ms
Lead Channel Setting Sensing Sensitivity: 0.9 mV

## 2021-12-05 ENCOUNTER — Telehealth: Payer: Self-pay | Admitting: Internal Medicine

## 2021-12-05 NOTE — Telephone Encounter (Signed)
Message below was received today by Dr. Caryl Comes based on 10/30/21 labs results.  ? ?Previously these had been reviewed by Dr. Caryl Comes and I had spoken with the patient on 11/02/21 and gave MD recommendations at that time from Dr. Caryl Comes: ? ?- stop lisinopril ?- follow up with Dr. Juleen China in the next week ? ? ?The patient advised that she is scheduled for: ? ?5/9- labwork for Dr. Juleen China ?5/16- OV with Dr. Juleen China ?5/23- OV with Dr. Parks Ranger ?5/30- OV with Dr. Caryl Comes ? ?I advised the patient I will forward a message to Dr. Caryl Comes to up date him of the above. ?I will review her chart next week to see what her renal function is at that time.  ? ?The patient confirms she is still off lisinopril and that her nausea has improved that was reported at her last office visit. ? ?The patient was very appreciative of the call today.  ? ?

## 2021-12-05 NOTE — Telephone Encounter (Signed)
Deboraha Sprang, MD  ?12/05/2021 12:14 PM EDT Back to Top  ?  ?Please Inform Patient ?  ?Labs are normal x worsening Cr ?She was supposed to have followup with Nephrology but it doesn't look like it happened   ?Can we repeat BMET ASAP ?  ?Thanks   ? ?

## 2021-12-06 NOTE — Progress Notes (Signed)
?Subjective:  ?Patient ID: Renee Boyd, female    DOB: 02/15/1951,  MRN: [id number] ? ?Chief Complaint  ?Patient presents with  ? Ankle Pain  ? ? ?71 y.o. female presents with the above complaint.  Patient presents with complaint of left big toe nail pain.  She states that has been hurting for quite some time has progressed to gotten worse.  She states that the ankle is doing a lot better now.  She wanted to get the nail evaluated.  She has not seen anyone else prior to seeing me for the nail.  She does not want to have the nail removed. ? ? ?Review of Systems: Negative except as noted in the HPI. Denies N/V/F/Ch. ? ?Past Medical History:  ?Diagnosis Date  ? Complete heart block ([place])   ? a. s/p MDT PPM 2006 with device generator replacement 2014; b. followed by Dr. Caryl Comes  ? Diabetes ([place])   ? GERD (gastroesophageal reflux disease)   ? Hypertension   ? Hypothyroid   ? Pacemaker   ? Persistent atrial fibrillation ([place])   ? a. on Eliquis; b. CHADS2VASc 6 (HTN. age x 1, DM, TIA x 2, female); c. s/p DCCV 05/28/18  ? Pulmonary hypertension ([place])   ? a. echo 2017: EF of 60-65%, no RWMA, mildly dilated left atrium, RVSF normal, PASP 64 mmHg  ? TIA (transient ischemic attack)   ? ? ?Current Outpatient Medications:  ?  acetaminophen (TYLENOL) 500 MG tablet, Take 1,000 mg by mouth every 8 (eight) hours as needed., Disp: , Rfl:  ?  allopurinol (ZYLOPRIM) 100 MG tablet, Take 1 tablet (100 mg total) by mouth daily., Disp: 90 tablet, Rfl: 3 ?  amiodarone (PACERONE) 200 MG tablet, TAKE ONE AND ONE-HALF TABLETS (300 MG) ONCE DAILY (DOSE DECREASE), Disp: 135 tablet, Rfl: 3 ?  amLODipine (NORVASC) 10 MG tablet, TAKE 1 TABLET DAILY, Disp: 90 tablet, Rfl: 3 ?  apixaban (ELIQUIS) 5 MG TABS tablet, Take 1 tablet (5 mg total) by mouth 2 (two) times daily., Disp: 180 tablet, Rfl: 1 ?  ascorbic acid (VITAMIN C) 1000 MG tablet, Take 1,000 mg by mouth daily., Disp: , Rfl:  ?  atorvastatin (LIPITOR) 20 MG tablet, TAKE 1 TABLET DAILY, Disp:  90 tablet, Rfl: 3 ?  Baclofen 5 MG TABS, Take 5 mg by mouth at bedtime., Disp: 90 tablet, Rfl: 3 ?  COMBIVENT RESPIMAT 20-100 MCG/ACT AERS respimat, INHALE 1 PUFF EVERY 6 HOURS AS NEEDED WHEEZING/ SHORTNESS OF BREATH, Disp: 4 g, Rfl: 2 ?  CONTOUR NEXT TEST test strip, Use to check blood sugar up to twice a day., Disp: 200 each, Rfl: 5 ?  desonide (DESOWEN) 0.05 % lotion, Apply topically 2 (two) times daily., Disp: 59 mL, Rfl: 0 ?  Dulaglutide 3 MG/0.5ML SOPN, Inject into the skin every Tuesday., Disp: , Rfl:  ?  furosemide (LASIX) 20 MG tablet, TAKE 3 TABLETS ONCE DAILY (DOSE INCREASE), Disp: 270 tablet, Rfl: 3 ?  JARDIANCE 10 MG TABS tablet, Take 10 mg by mouth daily., Disp: , Rfl:  ?  levothyroxine (SYNTHROID) 25 MCG tablet, Take 1 tablet (25 mcg total) by mouth daily before breakfast., Disp: 90 tablet, Rfl: 1 ?  metoprolol succinate (TOPROL-XL) 100 MG 24 hr tablet, Take 0.5 tablet (50 mg) by mouth once daily. Take with or immediately following a meal., Disp: 90 tablet, Rfl: 3 ?  Multiple Vitamin (MULTIVITAMIN) capsule, Take 1 capsule by mouth daily., Disp: , Rfl:  ?  omeprazole (PRILOSEC) 20 MG capsule,  Take 1 capsule (20 mg total) by mouth 2 (two) times daily before a meal., Disp: 180 capsule, Rfl: 2 ?  temazepam (RESTORIL) 30 MG capsule, TAKE 1 CAPSULE BY MOUTH AT BEDTIME AS NEEDED SLEEP, Disp: 30 capsule, Rfl: 2 ?  traMADol (ULTRAM) 50 MG tablet, Take 1 tablet (50 mg total) by mouth 2 (two) times daily. (Patient taking differently: Take 50 mg by mouth every 12 (twelve) hours as needed.), Disp: 60 tablet, Rfl: 2 ? ?Social History  ? ?Tobacco Use  ?Smoking Status Former  ? Packs/day: 2.00  ? Years: 40.00  ? Pack years: 80.00  ? Types: Cigarettes  ? Quit date: 06/27/2005  ? Years since quitting: 16.4  ?Smokeless Tobacco Former  ?Tobacco Comments  ? quit Jun 27 2005  ? ? ?Allergies  ?Allergen Reactions  ? Morphine And Related Itching  ? Neosporin [Bacitracin-Polymyxin B]   ? Other   ?  glue  ? Percocet  [Oxycodone-Acetaminophen] Other (See Comments)  ?  Hallucinations   ? ?Objective:  ?There were no vitals filed for this visit. ?There is no height or weight on file to calculate BMI. ?Constitutional Well developed. ?Well nourished.  ?Vascular Dorsalis pedis pulses palpable bilaterally. ?Posterior tibial pulses palpable bilaterally. ?Capillary refill normal to all digits.  ?No cyanosis or clubbing noted. ?Pedal hair growth normal.  ?Neurologic Normal speech. ?Oriented to person, place, and time. ?Epicritic sensation to light touch grossly present bilaterally.  ?Dermatologic Nails thickened elongated dystrophic mycotic nail left hallux.  Mild pain on palpation.  Mild ingrowing noted.  No infection noted ?Skin within normal limits  ?Orthopedic: Normal joint ROM without pain or crepitus bilaterally. ?No visible deformities. ?No bony tenderness.  ? ?Radiographs: None ?Assessment:  ? ?1. Nail dystrophy   ? ?Plan:  ?Patient was evaluated and treated and all questions answered. ? ?Left hallux onychodystrophy ?-I explained the patient the etiology of dystrophy and various treatment options were discussed.  I believe she will benefit from a slant back procedure.  Slant back was performed in standard technique.  She had immediate relief.  At this time we will hold off on doing an ingrown nail procedure as this gave her enough relief.  I discussed shoe gear modification with her as well.  She states understanding. ? ?No follow-ups on file. ?

## 2021-12-11 DIAGNOSIS — M109 Gout, unspecified: Secondary | ICD-10-CM | POA: Diagnosis not present

## 2021-12-11 DIAGNOSIS — N2581 Secondary hyperparathyroidism of renal origin: Secondary | ICD-10-CM | POA: Diagnosis not present

## 2021-12-11 DIAGNOSIS — E875 Hyperkalemia: Secondary | ICD-10-CM | POA: Diagnosis not present

## 2021-12-11 DIAGNOSIS — N184 Chronic kidney disease, stage 4 (severe): Secondary | ICD-10-CM | POA: Diagnosis not present

## 2021-12-11 DIAGNOSIS — E1122 Type 2 diabetes mellitus with diabetic chronic kidney disease: Secondary | ICD-10-CM | POA: Diagnosis not present

## 2021-12-11 DIAGNOSIS — R809 Proteinuria, unspecified: Secondary | ICD-10-CM | POA: Diagnosis not present

## 2021-12-11 DIAGNOSIS — E8722 Chronic metabolic acidosis: Secondary | ICD-10-CM | POA: Diagnosis not present

## 2021-12-11 DIAGNOSIS — I129 Hypertensive chronic kidney disease with stage 1 through stage 4 chronic kidney disease, or unspecified chronic kidney disease: Secondary | ICD-10-CM | POA: Diagnosis not present

## 2021-12-14 NOTE — Telephone Encounter (Signed)
Reviewed the patient's chart.  ?Lab work was done with Dr. Juleen China (nephrology) on 12/11/21. ?BUN/ Creatinine- 25/2.09. ? ?To Dr. Caryl Comes as an [place].  ? ?Patient scheduled to follow up with her providers as documented below. ? ? ?

## 2021-12-14 NOTE — Progress Notes (Signed)
Remote pacemaker transmission.   

## 2021-12-18 DIAGNOSIS — N184 Chronic kidney disease, stage 4 (severe): Secondary | ICD-10-CM | POA: Diagnosis not present

## 2021-12-18 DIAGNOSIS — I10 Essential (primary) hypertension: Secondary | ICD-10-CM | POA: Diagnosis not present

## 2021-12-18 DIAGNOSIS — N2581 Secondary hyperparathyroidism of renal origin: Secondary | ICD-10-CM | POA: Diagnosis not present

## 2021-12-18 DIAGNOSIS — E1122 Type 2 diabetes mellitus with diabetic chronic kidney disease: Secondary | ICD-10-CM | POA: Diagnosis not present

## 2021-12-18 DIAGNOSIS — R600 Localized edema: Secondary | ICD-10-CM | POA: Diagnosis not present

## 2021-12-19 ENCOUNTER — Telehealth: Payer: Self-pay | Admitting: Internal Medicine

## 2021-12-19 NOTE — Telephone Encounter (Signed)
I called and spoke with the patient. ?She wanted to let me know she had follow up with nephrology yesterday and Dr. Candiss Norse was pleased with her lab results. ? ?I advised the patient I had seen her lab results last week and had forwarded a message to Dr. Caryl Comes as an [place]. ? ?The patient voices understanding and was appreciative for the call back. ? ?She is aware we will see her back as scheduled on 01/01/22. ? ? ?

## 2021-12-19 NOTE — Telephone Encounter (Signed)
Patient called stating she needs to speak to the nurse.  Wouldn't go into further detail.  ?

## 2021-12-25 ENCOUNTER — Ambulatory Visit (INDEPENDENT_AMBULATORY_CARE_PROVIDER_SITE_OTHER): Payer: Medicare Other | Admitting: Family Medicine

## 2021-12-25 ENCOUNTER — Encounter: Payer: Self-pay | Admitting: Family Medicine

## 2021-12-25 ENCOUNTER — Other Ambulatory Visit: Payer: Self-pay | Admitting: Family Medicine

## 2021-12-25 VITALS — BP 136/70 | HR 63 | Ht 59.0 in | Wt 150.0 lb

## 2021-12-25 DIAGNOSIS — E782 Mixed hyperlipidemia: Secondary | ICD-10-CM

## 2021-12-25 DIAGNOSIS — E1169 Type 2 diabetes mellitus with other specified complication: Secondary | ICD-10-CM

## 2021-12-25 DIAGNOSIS — E039 Hypothyroidism, unspecified: Secondary | ICD-10-CM | POA: Diagnosis not present

## 2021-12-25 DIAGNOSIS — I129 Hypertensive chronic kidney disease with stage 1 through stage 4 chronic kidney disease, or unspecified chronic kidney disease: Secondary | ICD-10-CM | POA: Diagnosis not present

## 2021-12-25 DIAGNOSIS — R0609 Other forms of dyspnea: Secondary | ICD-10-CM

## 2021-12-25 DIAGNOSIS — N184 Chronic kidney disease, stage 4 (severe): Secondary | ICD-10-CM | POA: Diagnosis not present

## 2021-12-25 DIAGNOSIS — E785 Hyperlipidemia, unspecified: Secondary | ICD-10-CM | POA: Diagnosis not present

## 2021-12-25 DIAGNOSIS — I951 Orthostatic hypotension: Secondary | ICD-10-CM | POA: Insufficient documentation

## 2021-12-25 DIAGNOSIS — I4819 Other persistent atrial fibrillation: Secondary | ICD-10-CM

## 2021-12-25 LAB — POCT GLYCOSYLATED HEMOGLOBIN (HGB A1C): Hemoglobin A1C: 6.3 % — AB (ref 4.0–5.6)

## 2021-12-25 MED ORDER — COMBIVENT RESPIMAT 20-100 MCG/ACT IN AERS: INHALATION_SPRAY | RESPIRATORY_TRACT | 2 refills | Status: DC

## 2021-12-25 NOTE — Progress Notes (Signed)
Subjective:    Patient ID: Renee Boyd, female    DOB: October 20, 1950, 71 y.o.   MRN: [id number]  Renee Boyd is a 71 y.o. female presenting on 12/25/2021 for Diabetes   HPI  Last follow up with Dr Juleen China, determined her blood pressure was lower. She had issue with near syncope or falling.  Specialists: Endocrinology - Malissa Hippo NP [facility] Endocrinology) Nephrology - Dr Lavonia Dana [facility]) Cardiology - [facility] [place] / EP       CKD-IV with hyperkalemia and subnephrotic proteinuria HTN Secondary Anemia of chronic kidney disease  Secondary to DM HTN Followed by Dr Juleen China, now he is out of office and she has seen Dr Candiss Norse 12/18/21 Kidney function reduced some then she did some classes on CKD and kidney function has improved.  Note she was discontinued on Lisinopril due to low pressure causing her to have orthostatic symptoms. Now significant improved.  Also they discontinued Amiodarone '300mg'$ . They reduced Metoprolol XL from 100 to '50mg'$ . She has reduced to '50mg'$ .  On Furosemide, Amlodipine Off sodium bicarbonate   Type 2 Diabetes Followed by [facility] Endocrinology currently due to advancing renal disease. Remains discontinued on Metformin due to renal function. She should avoid NSAIDs. But continue Lisinopril ACEi. Last A1c prior 6.8, now down to 6.3 She uses Contour Next EZ Glucomter On Trulicity 3 mg weekly (inc by Endocrine) On Jardiance '10mg'$  - per Nephrology Off Metformin due to CKD Off Rybelsus due to nausea   Osteoarthritis, multiple joints / Back Pain / Hip Pain / Shoulder pain Chronic problem. Episodic painful flares of joint pain, worse with inc activity and storms and weather changes Previously managed on Tramadol PRN for pain and this was renally adjusted to max of '50mg'$  BID - still has if needs, may request re order in future  Cannot take NSAIDs due to CKD. On Gabapentin '300mg'$  daily doing well. Using baclofen '5mg'$  only max dose nightly   Persistent Atrial  Fibrillation, s/p Cardioversion On Anticoagulation with Eliquis Complete Heart Block Precordial Chest Pain Followed by Dr Virl Axe She had Cardioversion in March 2022, unsuccessful. Last test 12/07/20 nuclear stress test. Now off Amiodarone and reduced Metoprolol XL   Chronic Insomnia Primary problem difficulty sleeping. She takes Temazepam nightly PRN. Will need refill in future       03/20/2021   11:21 AM 12/21/2020   10:51 AM 06/27/2020   11:18 AM  Depression screen PHQ 2/9  Decreased Interest 0 0 0  Down, Depressed, Hopeless 0 0 0  PHQ - 2 Score 0 0 0  Altered sleeping  0   Tired, decreased energy  0   Change in appetite  0   Feeling bad or failure about yourself   0   Trouble concentrating  0   Moving slowly or fidgety/restless  0   Suicidal thoughts  0   PHQ-9 Score  0   Difficult doing work/chores  Not difficult at all     Social History   Tobacco Use   Smoking status: Former    Packs/day: 2.00    Years: 40.00    Pack years: 80.00    Types: Cigarettes    Quit date: 06/27/2005    Years since quitting: 16.5   Smokeless tobacco: Former   Tobacco comments:    quit Jun 27 2005  Vaping Use   Vaping Use: Never used  Substance Use Topics   Alcohol use: No   Drug use: No    Review of Systems  Per HPI unless specifically indicated above     Objective:    BP 136/70   Pulse 63   Ht '4\' 11"'$  (1.499 m)   Wt 150 lb (68 kg)   SpO2 100%   BMI 30.30 kg/m   Wt Readings from Last 3 Encounters:  12/25/21 150 lb (68 kg)  10/30/21 150 lb (68 kg)  06/27/21 157 lb (71.2 kg)    Physical Exam Vitals and nursing note reviewed.  Constitutional:      General: She is not in acute distress.    Appearance: She is well-developed. She is obese. She is not diaphoretic.     Comments: Well-appearing, comfortable, cooperative  HENT:     Head: Normocephalic and atraumatic.  Eyes:     General:        Right eye: No discharge.        Left eye: No discharge.      Conjunctiva/sclera: Conjunctivae normal.     Pupils: Pupils are equal, round, and reactive to light.  Neck:     Thyroid: No thyromegaly.  Cardiovascular:     Rate and Rhythm: Normal rate and regular rhythm.     Pulses: Normal pulses.     Heart sounds: Normal heart sounds. No murmur heard. Pulmonary:     Effort: Pulmonary effort is normal. No respiratory distress.     Breath sounds: Normal breath sounds. No wheezing or rales.  Abdominal:     General: Bowel sounds are normal. There is no distension.     Palpations: Abdomen is soft. There is no mass.     Tenderness: There is no abdominal tenderness.  Musculoskeletal:        General: No tenderness. Normal range of motion.     Cervical back: Normal range of motion and neck supple.     Right lower leg: No edema.     Left lower leg: No edema.     Comments: Upper / Lower Extremities: - Normal muscle tone, strength bilateral upper extremities 5/5, lower extremities 5/5  Lymphadenopathy:     Cervical: No cervical adenopathy.  Skin:    General: Skin is warm and dry.     Findings: No erythema or rash.  Neurological:     Mental Status: She is alert and oriented to person, place, and time.     Comments: Distal sensation intact to light touch all extremities  Psychiatric:        Mood and Affect: Mood normal.        Behavior: Behavior normal.        Thought Content: Thought content normal.     Comments: Well groomed, good eye contact, normal speech and thoughts   Results for orders placed or performed in visit on 12/25/21  Microalbumin, urine  Result Value Ref Range   Microalb, Ur <30   POCT HgB A1C  Result Value Ref Range   Hemoglobin A1C 6.3 (A) 4.0 - 5.6 %      Assessment & Plan:   Problem List Items Addressed This Visit     Type 2 diabetes mellitus with hyperlipidemia ([place]) - Primary   Relevant Orders   POCT HgB A1C (Completed)   Orthostatic hypotension   Hypothyroidism   CKD (chronic kidney disease), stage IV (HCC)    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)    T2DM improved w A1c 6.3  Keep on current medication list. I agree with the changes that were made regarding BP  She has been  discontinued Amiodarone '300mg'$ , Metoprolol XL 100 to '50mg'$   Caution orthostatic, Caution with standing quickly.  Reassurance that dizziness is improving.  Continue w/ [facility] for management  Continue w/ Cardiology, has upcoming pacemaker replacement scheduled   No orders of the defined types were placed in this encounter.    Follow up plan: Return in about 6 months (around 06/27/2022) for 6 month fasting lab only then 1 week later [facility].  Future labs ordered for 06/2022  [name], [facility] 12/25/2021, 9:41 AM

## 2021-12-25 NOTE — Patient Instructions (Addendum)
Thank you for coming to the office today.  Recent Labs    05/16/21 0000 12/25/21 0942  HGBA1C 7.5 6.3*   Keep on current medication list. I agree with the changes that were made.  Caution with standing quickly.  Glad it is improving.  I am glad to hear that you are having less symptoms.  DUE for FASTING BLOOD WORK (no food or drink after midnight before the lab appointment, only water or coffee without cream/sugar on the morning of)  SCHEDULE "Lab Only" visit in the morning at the clinic for lab draw in 6 MONTHS   - Make sure Lab Only appointment is at about 1 week before your next appointment, so that results will be available  For Lab Results, once available within 2-3 days of blood draw, you can can log in to MyChart online to view your results and a brief explanation. Also, we can discuss results at next follow-up visit.   Please schedule a Follow-up Appointment to: Return in about 6 months (around 06/27/2022) for 6 month fasting lab only then 1 week later [facility].  If you have any other questions or concerns, please feel free to call the office or send a message through [place]. You may also schedule an earlier appointment if necessary.  Additionally, you may be receiving a survey about your experience at our office within a few days to 1 week by e-mail or mail. We value your feedback.  [name], DO [place]

## 2021-12-28 ENCOUNTER — Ambulatory Visit (INDEPENDENT_AMBULATORY_CARE_PROVIDER_SITE_OTHER): Payer: Medicare Other

## 2021-12-28 DIAGNOSIS — I442 Atrioventricular block, complete: Secondary | ICD-10-CM

## 2021-12-31 LAB — CUP PACEART REMOTE DEVICE CHECK
Battery Remaining Longevity: 3 mo
Battery Voltage: 2.84 V
Brady Statistic AP VP Percent: 99.82 %
Brady Statistic AP VS Percent: 0 %
Brady Statistic AS VP Percent: 0.18 %
Brady Statistic AS VS Percent: 0 %
Brady Statistic RA Percent Paced: 99.79 %
Brady Statistic RV Percent Paced: 100 %
Date Time Interrogation Session: 20230526104045
Implantable Lead Implant Date: 20141217
Implantable Lead Implant Date: 20141217
Implantable Lead Location: 753859
Implantable Lead Location: 753860
Implantable Lead Model: 5076
Implantable Lead Model: 5076
Implantable Pulse Generator Implant Date: 20141217
Lead Channel Impedance Value: 437 Ohm
Lead Channel Impedance Value: 456 Ohm
Lead Channel Impedance Value: 532 Ohm
Lead Channel Impedance Value: 570 Ohm
Lead Channel Pacing Threshold Amplitude: 0.375 V
Lead Channel Pacing Threshold Amplitude: 0.875 V
Lead Channel Pacing Threshold Pulse Width: 0.4 ms
Lead Channel Pacing Threshold Pulse Width: 0.4 ms
Lead Channel Sensing Intrinsic Amplitude: 17.375 mV
Lead Channel Sensing Intrinsic Amplitude: 17.375 mV
Lead Channel Sensing Intrinsic Amplitude: 3.375 mV
Lead Channel Sensing Intrinsic Amplitude: 3.375 mV
Lead Channel Setting Pacing Amplitude: 1.75 V
Lead Channel Setting Pacing Amplitude: 2.5 V
Lead Channel Setting Pacing Pulse Width: 0.4 ms
Lead Channel Setting Sensing Sensitivity: 0.9 mV

## 2022-01-01 ENCOUNTER — Encounter: Payer: Self-pay | Admitting: Internal Medicine

## 2022-01-01 ENCOUNTER — Ambulatory Visit (INDEPENDENT_AMBULATORY_CARE_PROVIDER_SITE_OTHER): Payer: Medicare Other | Admitting: Internal Medicine

## 2022-01-01 VITALS — BP 150/90 | HR 88 | Ht 59.0 in | Wt 148.8 lb

## 2022-01-01 DIAGNOSIS — I442 Atrioventricular block, complete: Secondary | ICD-10-CM | POA: Diagnosis not present

## 2022-01-01 DIAGNOSIS — N189 Chronic kidney disease, unspecified: Secondary | ICD-10-CM

## 2022-01-01 DIAGNOSIS — Z950 Presence of cardiac pacemaker: Secondary | ICD-10-CM

## 2022-01-01 DIAGNOSIS — I10 Essential (primary) hypertension: Secondary | ICD-10-CM

## 2022-01-01 DIAGNOSIS — I493 Ventricular premature depolarization: Secondary | ICD-10-CM

## 2022-01-01 DIAGNOSIS — Z79899 Other long term (current) drug therapy: Secondary | ICD-10-CM

## 2022-01-01 DIAGNOSIS — I4819 Other persistent atrial fibrillation: Secondary | ICD-10-CM

## 2022-01-01 NOTE — Patient Instructions (Signed)
Medication Instructions:  - Your physician recommends that you continue on your current medications as directed. Please refer to the Current Medication list given to you today.  *If you need a refill on your cardiac medications before your next appointment, please call your pharmacy*   Lab Work: - none ordered  If you have labs (blood work) drawn today and your tests are completely normal, you will receive your results only by: MyChart Message (if you have MyChart) OR A paper copy in the mail If you have any lab test that is abnormal or we need to change your treatment, we will call you to review the results.   Testing/Procedures: - none ordered   Follow-Up: At [facility], you and your health needs are our priority.  As part of our continuing mission to provide you with exceptional heart care, we have created designated Provider Care Teams.  These Care Teams include your primary Cardiologist (physician) and Advanced Practice Providers (APPs -  Physician Assistants and Nurse Practitioners) who all work together to provide you with the care you need, when you need it.  We recommend signing up for the patient portal called "MyChart".  Sign up information is provided on this After Visit Summary.  MyChart is used to connect with patients for Virtual Visits (Telemedicine).  Patients are able to view lab/test results, encounter notes, upcoming appointments, etc.  Non-urgent messages can be sent to your provider as well.   To learn more about what you can do with MyChart, go to [email or web address].    Your next appointment:   6 month(s)  The format for your next appointment:   In Person  Provider:   Steven Klein, MD    Other Instructions N/a  Important Information About Sugar       

## 2022-01-01 NOTE — Progress Notes (Unsigned)
Patient Care Team: Olin Hauser, DO as PCP - General (Family Medicine) Lavonia Dana, MD as Consulting Physician (Nephrology) Deboraha Sprang, MD as Consulting Physician (Cardiology) Edrick Kins, DPM as Consulting Physician (Podiatry) Vanita Ingles, RN as Case Manager (General Practice)   HPI  Renee Boyd is a 71 y.o. female Seen in followup for largely asymptomatic persistent atrial fibrillation.  She transferred care to here 7/17.  At that time, anticoagulation was initiated w Apixoban    She is s/p pacemaker insertion Medtronic 2014 for sinus node dysfunction and CHB; device approaching ERI   Previously exposed to amiodarone when started [employer].   Underwent cardioversion 7/21 with significant improvement in symptoms.  She reverted back to atrial fib/flutter shortly after her last office visit.  Anticipated repeat cardioversion following reloading with amiodarone to assess benefit of restoring sinus rhythm done 3/22  Mild stable shortness of breath; mild exertional chest discomfort-intermittent no palpitations.  Chronic mild edema. Under a great deal of stress related to something at church  Previously struggling with nausea.  We stop the amiodarone.  Maybe had something to do with some eggs.  Records and Results Reviewed Date Cr K Hgb LFTs TSH  10/17 1.36  8.8  0.304  2/18 1.73 4.9   0.6  6/18  1.3  11.1    1/20 1.2 4.3 11.3    1/21 1.32 3.2 9.6 48   7/21 2.37 4.2 12.4 38 0.59  8/21 2.64 (CE)      12/21 2.64 (CE)       3/22 1.9. 4.5 12.1 28 0.642  9/22 1.78 4.4 12.0    11/22 2.35 4.2  53 0.61  2/23 2.53 4.4  36 (3/23)   5/23 2.09 4.1 12.8     DATE TEST EF   7/17 Echo   60-65 % Pulm HTN 64 (no E/E')  12/19  Echo  50-55% LVH severe (17/16)   PAsys 50-55   MR mild LAE(37/2.0/34)  1/20 CTA  CAD 2V   FFR > 0.8 LAD/RCA  5/22 Myoview  75% No ischemia          .   Thromboembolic risk factors ( age -36, HTN-1, TIA/CVA-2, DM-1 ,  Gender-1) for a CHADSVASc Score of 6     Past Medical History:  Diagnosis Date   Complete heart block ([place])    a. s/p MDT PPM 2006 with device generator replacement 2014; b. followed by Dr. Caryl Comes   Diabetes [facility])    GERD (gastroesophageal reflux disease)    Hypertension    Hypothyroid    Pacemaker    Persistent atrial fibrillation ([place])    a. on Eliquis; b. CHADS2VASc 6 (HTN. age x 1, DM, TIA x 2, female); c. s/p DCCV 05/28/18   Pulmonary hypertension ([place])    a. echo 2017: EF of 60-65%, no RWMA, mildly dilated left atrium, RVSF normal, PASP 64 mmHg   TIA (transient ischemic attack)     Past Surgical History:  Procedure Laterality Date   ABDOMINAL HYSTERECTOMY     CARDIOVERSION N/A 05/28/2018   Procedure: CARDIOVERSION;  Surgeon: Minna Merritts, MD;  Location: [facility] ORS;  Service: Cardiovascular;  Laterality: N/A;   CARDIOVERSION N/A 03/01/2020   Procedure: CARDIOVERSION;  Surgeon: Kate Sable, MD;  Location: [facility];  Service: Cardiovascular;  Laterality: N/A;   CARDIOVERSION N/A 10/25/2020   Procedure: CARDIOVERSION;  Surgeon: Kate Sable, MD;  Location: [facility] ORS;  Service: Cardiovascular;  Laterality:  N/A;   CHOLECYSTECTOMY     TONSILLECTOMY     TUBAL LIGATION      Current Outpatient Medications  Medication Sig Dispense Refill   acetaminophen (TYLENOL) 500 MG tablet Take 1,000 mg by mouth every 8 (eight) hours as needed.     allopurinol (ZYLOPRIM) 100 MG tablet Take 1 tablet (100 mg total) by mouth daily. 90 tablet 3   amLODipine (NORVASC) 10 MG tablet TAKE 1 TABLET DAILY 90 tablet 3   apixaban (ELIQUIS) 5 MG TABS tablet Take 1 tablet (5 mg total) by mouth 2 (two) times daily. 180 tablet 1   ascorbic acid (VITAMIN C) 1000 MG tablet Take 1,000 mg by mouth daily.     atorvastatin (LIPITOR) 20 MG tablet TAKE 1 TABLET DAILY 90 tablet 3   Baclofen 5 MG TABS Take 5 mg by mouth at bedtime. 90 tablet 3   COMBIVENT RESPIMAT 20-100 MCG/ACT AERS respimat INHALE 1  PUFF EVERY 6 HOURS AS NEEDED WHEEZING/ SHORTNESS OF BREATH 4 g 2   CONTOUR NEXT TEST test strip Use to check blood sugar up to twice a day. 200 each 5   desonide (DESOWEN) 0.05 % lotion Apply topically 2 (two) times daily. 59 mL 0   Dulaglutide 3 MG/0.5ML SOPN Inject into the skin every Tuesday.     furosemide (LASIX) 20 MG tablet TAKE 3 TABLETS ONCE DAILY (DOSE INCREASE) 270 tablet 3   JARDIANCE 10 MG TABS tablet Take 10 mg by mouth daily.     levothyroxine (SYNTHROID) 25 MCG tablet Take 1 tablet (25 mcg total) by mouth daily before breakfast. 90 tablet 1   metoprolol succinate (TOPROL-XL) 100 MG 24 hr tablet Take 0.5 tablet (50 mg) by mouth once daily. Take with or immediately following a meal. 90 tablet 3   Multiple Vitamin (MULTIVITAMIN) capsule Take 1 capsule by mouth daily.     omeprazole (PRILOSEC) 20 MG capsule Take 1 capsule (20 mg total) by mouth 2 (two) times daily before a meal. 180 capsule 2   temazepam (RESTORIL) 30 MG capsule TAKE 1 CAPSULE BY MOUTH AT BEDTIME AS NEEDED SLEEP 30 capsule 2   traMADol (ULTRAM) 50 MG tablet Take by mouth every 12 (twelve) hours as needed.     No current facility-administered medications for this visit.    Allergies  Allergen Reactions   Morphine And Related Itching   Neosporin [Bacitracin-Polymyxin B]    Other     glue   Percocet [Oxycodone-Acetaminophen] Other (See Comments)    Hallucinations       Review of Systems negative except from HPI and PMH  Physical Exam  BP (!) 150/90 (BP Location: Left Arm, Patient Position: Sitting, Cuff Size: Normal)   Pulse 88   Ht '4\' 11"'$  (1.499 m)   Wt 148 lb 12.8 oz (67.5 kg)   SpO2 94%   BMI 30.05 kg/m   Well developed and well nourished in no acute distress HENT normal Neck supple with JVP-flat Clear Device pocket well healed; without hematoma or erythema.  There is no tethering  Regular rate and rhythm, no  gallop No murmur Abd-soft with active BS No Clubbing cyanosis tr edema Skin-warm  and dry A & Oriented  Grossly normal sensory and motor function  ECG AV pacing with a Qr lead V1 and upright QRS lead I    Assessment and  Plan  Nausea and vomiting  Atrial fibrillation/flutter long-term persistent  Ventricular tachycardia-nonsustained  HFpEF chronic  Hypertension   Pacemaker  Medtronic  Sinus arrest  Complete heart block  Orthostatic hypotension  PVCs  Sleep disordered breathing and daytime somnolence averse to a sleep study  Atrial fibrillation remains quiescient.  Nausea is much improved off amiodarone.  We will keep her off. Orthostasis is also somewhat better.  Continue anticoagulation with apixaban 5 twice daily.  Blood pressure is elevated and also on recheck.  We will have her check it at home; with her renal issues, I will reach out to Dr. Candiss Norse about using hydralazine.  Discussed stress management with PWEnem

## 2022-01-07 NOTE — Progress Notes (Signed)
Remote pacemaker transmission.   

## 2022-01-16 ENCOUNTER — Telehealth: Payer: Self-pay | Admitting: Internal Medicine

## 2022-01-16 NOTE — Telephone Encounter (Signed)
Follow Up:   Patient said she was returning a call from Monday.

## 2022-01-16 NOTE — Telephone Encounter (Signed)
Spoke with patient and she stated someone called her on Monday and she was returning that call. Reviewed her chart, results, and I do not see a telephone encounter. Advised that Nira Conn is out of the office but I will send her this message in case she did try to call. She states everything is fine and she has no concerns. She said no need to call back unless something is needed.

## 2022-01-17 ENCOUNTER — Other Ambulatory Visit: Payer: Self-pay | Admitting: Internal Medicine

## 2022-01-17 NOTE — Telephone Encounter (Signed)
Pt last saw Dr Caryl Comes 01/01/22, last labs 10/30/21 Creat 3.16, age 71, weight 67.5kg, based on specified criteria pt ison appropriate dosage of Eliquis '5mg'$  BID for afib.  Will refill rx.

## 2022-01-17 NOTE — Telephone Encounter (Signed)
Refill request

## 2022-01-21 NOTE — Telephone Encounter (Signed)
Noted- but I was out of the office and had not tried to reach the patient.

## 2022-01-24 ENCOUNTER — Other Ambulatory Visit: Payer: Self-pay

## 2022-01-24 DIAGNOSIS — L209 Atopic dermatitis, unspecified: Secondary | ICD-10-CM

## 2022-01-24 MED ORDER — DESONIDE 0.05 % EX LOTN: TOPICAL_LOTION | Freq: Two times a day (BID) | CUTANEOUS | 0 refills | Status: DC

## 2022-01-30 ENCOUNTER — Ambulatory Visit (INDEPENDENT_AMBULATORY_CARE_PROVIDER_SITE_OTHER): Payer: Medicare Other

## 2022-01-30 DIAGNOSIS — I442 Atrioventricular block, complete: Secondary | ICD-10-CM

## 2022-01-31 LAB — CUP PACEART REMOTE DEVICE CHECK
Battery Remaining Longevity: 2 mo
Battery Voltage: 2.83 V
Brady Statistic AP VP Percent: 99.93 %
Brady Statistic AP VS Percent: 0 %
Brady Statistic AS VP Percent: 0.07 %
Brady Statistic AS VS Percent: 0 %
Brady Statistic RA Percent Paced: 99.88 %
Brady Statistic RV Percent Paced: 100 %
Date Time Interrogation Session: 20230628113611
Implantable Lead Implant Date: 20141217
Implantable Lead Implant Date: 20141217
Implantable Lead Location: 753859
Implantable Lead Location: 753860
Implantable Lead Model: 5076
Implantable Lead Model: 5076
Implantable Pulse Generator Implant Date: 20141217
Lead Channel Impedance Value: 418 Ohm
Lead Channel Impedance Value: 418 Ohm
Lead Channel Impedance Value: 494 Ohm
Lead Channel Impedance Value: 513 Ohm
Lead Channel Pacing Threshold Amplitude: 0.375 V
Lead Channel Pacing Threshold Amplitude: 0.75 V
Lead Channel Pacing Threshold Pulse Width: 0.4 ms
Lead Channel Pacing Threshold Pulse Width: 0.4 ms
Lead Channel Sensing Intrinsic Amplitude: 17.375 mV
Lead Channel Sensing Intrinsic Amplitude: 17.375 mV
Lead Channel Sensing Intrinsic Amplitude: 2.875 mV
Lead Channel Sensing Intrinsic Amplitude: 2.875 mV
Lead Channel Setting Pacing Amplitude: 1.75 V
Lead Channel Setting Pacing Amplitude: 2.5 V
Lead Channel Setting Pacing Pulse Width: 0.4 ms
Lead Channel Setting Sensing Sensitivity: 0.9 mV

## 2022-02-12 LAB — CUP PACEART INCLINIC DEVICE CHECK
Battery Remaining Longevity: 3 mo
Battery Voltage: 2.84 V
Brady Statistic AP VP Percent: 99.9 %
Brady Statistic AP VS Percent: 0 %
Brady Statistic AS VP Percent: 0.09 %
Brady Statistic AS VS Percent: 0 %
Brady Statistic RA Percent Paced: 99.87 %
Brady Statistic RV Percent Paced: 100 %
Date Time Interrogation Session: 20230530094400
Implantable Lead Implant Date: 20141217
Implantable Lead Implant Date: 20141217
Implantable Lead Location: 753859
Implantable Lead Location: 753860
Implantable Lead Model: 5076
Implantable Lead Model: 5076
Implantable Pulse Generator Implant Date: 20141217
Lead Channel Impedance Value: 437 Ohm
Lead Channel Impedance Value: 437 Ohm
Lead Channel Impedance Value: 532 Ohm
Lead Channel Impedance Value: 532 Ohm
Lead Channel Pacing Threshold Amplitude: 0.375 V
Lead Channel Pacing Threshold Amplitude: 0.75 V
Lead Channel Pacing Threshold Pulse Width: 0.4 ms
Lead Channel Pacing Threshold Pulse Width: 0.4 ms
Lead Channel Sensing Intrinsic Amplitude: 17.375 mV
Lead Channel Sensing Intrinsic Amplitude: 17.375 mV
Lead Channel Sensing Intrinsic Amplitude: 3.375 mV
Lead Channel Sensing Intrinsic Amplitude: 3.375 mV
Lead Channel Setting Pacing Amplitude: 1.75 V
Lead Channel Setting Pacing Amplitude: 2.5 V
Lead Channel Setting Pacing Pulse Width: 0.4 ms
Lead Channel Setting Sensing Sensitivity: 0.9 mV

## 2022-02-18 ENCOUNTER — Encounter: Payer: Self-pay | Admitting: Internal Medicine

## 2022-02-19 NOTE — Progress Notes (Signed)
Remote pacemaker transmission.   

## 2022-02-20 ENCOUNTER — Other Ambulatory Visit: Payer: Self-pay | Admitting: Internal Medicine

## 2022-02-28 ENCOUNTER — Ambulatory Visit (INDEPENDENT_AMBULATORY_CARE_PROVIDER_SITE_OTHER): Payer: Medicare Other

## 2022-02-28 DIAGNOSIS — I442 Atrioventricular block, complete: Secondary | ICD-10-CM

## 2022-03-01 ENCOUNTER — Telehealth: Payer: Self-pay

## 2022-03-01 LAB — CUP PACEART REMOTE DEVICE CHECK
Battery Remaining Longevity: 1 mo
Battery Voltage: 2.82 V
Brady Statistic AP VP Percent: 99.92 %
Brady Statistic AP VS Percent: 0 %
Brady Statistic AS VP Percent: 0.07 %
Brady Statistic AS VS Percent: 0 %
Brady Statistic RA Percent Paced: 99.87 %
Brady Statistic RV Percent Paced: 99.99 %
Date Time Interrogation Session: 20230727151118
Implantable Lead Implant Date: 20141217
Implantable Lead Implant Date: 20141217
Implantable Lead Location: 753859
Implantable Lead Location: 753860
Implantable Lead Model: 5076
Implantable Lead Model: 5076
Implantable Pulse Generator Implant Date: 20141217
Lead Channel Impedance Value: 418 Ohm
Lead Channel Impedance Value: 437 Ohm
Lead Channel Impedance Value: 494 Ohm
Lead Channel Impedance Value: 532 Ohm
Lead Channel Pacing Threshold Amplitude: 0.375 V
Lead Channel Pacing Threshold Amplitude: 0.75 V
Lead Channel Pacing Threshold Pulse Width: 0.4 ms
Lead Channel Pacing Threshold Pulse Width: 0.4 ms
Lead Channel Sensing Intrinsic Amplitude: 17.625 mV
Lead Channel Sensing Intrinsic Amplitude: 17.625 mV
Lead Channel Sensing Intrinsic Amplitude: 3.375 mV
Lead Channel Sensing Intrinsic Amplitude: 3.375 mV
Lead Channel Setting Pacing Amplitude: 1.75 V
Lead Channel Setting Pacing Amplitude: 2.5 V
Lead Channel Setting Pacing Pulse Width: 0.4 ms
Lead Channel Setting Sensing Sensitivity: 0.9 mV

## 2022-03-01 NOTE — Telephone Encounter (Signed)
PPM reached RRT 02/20/22. Attempted to contact patient to advise. No answer, unable to leave VM on either number on file.

## 2022-03-01 NOTE — Telephone Encounter (Signed)
  Pt is returning call, she said, to call her at [phone]

## 2022-03-01 NOTE — Telephone Encounter (Signed)
Returned phone call. Patient advised need of apt with Dr. Caryl Comes to discuss gen change. Voiced understanding. Please call phone number below for apts.  [phone].

## 2022-03-04 NOTE — Telephone Encounter (Signed)
Pt has been scheduled with Dr Caryl Comes at his [place] office on 03/21/2022.

## 2022-03-11 ENCOUNTER — Telehealth: Payer: Self-pay | Admitting: Internal Medicine

## 2022-03-11 ENCOUNTER — Other Ambulatory Visit: Payer: Self-pay | Admitting: *Deleted

## 2022-03-11 MED ORDER — METOPROLOL SUCCINATE ER 50 MG PO TB24: 50.0000 mg | ORAL_TABLET | Freq: Every day | ORAL | 3 refills | Status: DC

## 2022-03-11 NOTE — Telephone Encounter (Signed)
I spoke with the patient. Her PPM is at [facility] as of 02/20/22.  The patient was concerned that her follow up with Dr. Caryl Comes is not until the end of August. I advised that she has a 3 month window on her battery to be replaced once it trips ERI. She is aware we will see her on 04/04/22 and pick a date for her battery changeout at that time.   The patient voices understanding and is agreeable.

## 2022-03-11 NOTE — Telephone Encounter (Signed)
Patient is requesting a call from Dr. Olin Pia nurse stating it is very important. Patient would not specify what it is in regards to stating she will tell the nurse when she calls back.

## 2022-03-12 ENCOUNTER — Other Ambulatory Visit: Payer: Self-pay | Admitting: Family Medicine

## 2022-03-12 DIAGNOSIS — F5101 Primary insomnia: Secondary | ICD-10-CM

## 2022-03-12 NOTE — Telephone Encounter (Signed)
Requested medication (s) are due for refill today - yes  Requested medication (s) are on the active medication list -yes  Future visit scheduled -yes  Last refill: 11/13/21 #30 2RF  Notes to clinic: non delegated Rx  Requested Prescriptions  Pending Prescriptions Disp Refills   temazepam (RESTORIL) 30 MG capsule [Pharmacy Med Name: TEMAZEPAM 30 MG CAP] 30 capsule     Sig: TAKE 1 CAPSULE BY MOUTH AT BEDTIME AS NEEDED SLEEP     Not Delegated - Psychiatry: Anxiolytics/Hypnotics 2 Failed - 03/12/2022 10:56 AM      Failed - This refill cannot be delegated      Failed - Urine Drug Screen completed in last 360 days      Passed - Patient is not pregnant      Passed - Valid encounter within last 6 months    Recent Outpatient Visits           2 months ago Type 2 diabetes mellitus with hyperlipidemia ([place])   [facility], Devonne Doughty, DO   8 months ago Benign hypertension with CKD (chronic kidney disease) stage IV ([place])   [facility] Olin Hauser, DO   1 year ago Benign hypertension with CKD (chronic kidney disease) stage IV ([place])   [facility] Olin Hauser, DO   1 year ago Benign hypertension with CKD (chronic kidney disease) stage IV ([place])   [facility] Olin Hauser, DO   2 years ago Primary osteoarthritis involving multiple joints   [place], DO       Future Appointments             In 2 weeks  [facility], [place]   In 3 months [facility], Devonne Doughty, DO [facility], [facility]               Requested Prescriptions  Pending Prescriptions Disp Refills   temazepam (RESTORIL) 30 MG capsule [Pharmacy Med Name: TEMAZEPAM 30 MG CAP] 30 capsule     Sig: TAKE 1 CAPSULE BY MOUTH AT BEDTIME AS NEEDED SLEEP     Not Delegated - Psychiatry: Anxiolytics/Hypnotics 2 Failed - 03/12/2022 10:56 AM      Failed -  This refill cannot be delegated      Failed - Urine Drug Screen completed in last 360 days      Passed - Patient is not pregnant      Passed - Valid encounter within last 6 months    Recent Outpatient Visits           2 months ago Type 2 diabetes mellitus with hyperlipidemia ([place])   [facility], Devonne Doughty, DO   8 months ago Benign hypertension with CKD (chronic kidney disease) stage IV [facility])   [facility] Olin [facility]er, DO   1 year ago Benign hypertension with CKD (chronic kidney disease) stage IV [facility])   [facility] Olin Hauser, DO   1 year ago Benign hypertension with CKD (chronic kidney disease) stage IV [facility])   [place], DO   2 years ago Primary osteoarthritis involving multiple joints   [place], Devonne Doughty, DO       Future Appointments             In 2 weeks  [facility], [place]   In  3 months [facility], [facility], [facility]

## 2022-03-19 ENCOUNTER — Encounter: Payer: Self-pay | Admitting: Family Medicine

## 2022-03-19 ENCOUNTER — Ambulatory Visit (INDEPENDENT_AMBULATORY_CARE_PROVIDER_SITE_OTHER): Payer: Medicare Other | Admitting: Family Medicine

## 2022-03-19 VITALS — BP 139/66 | HR 69 | Ht 59.0 in | Wt 146.8 lb

## 2022-03-19 DIAGNOSIS — G8929 Other chronic pain: Secondary | ICD-10-CM

## 2022-03-19 DIAGNOSIS — M5442 Lumbago with sciatica, left side: Secondary | ICD-10-CM | POA: Diagnosis not present

## 2022-03-19 DIAGNOSIS — M159 Polyosteoarthritis, unspecified: Secondary | ICD-10-CM | POA: Diagnosis not present

## 2022-03-19 DIAGNOSIS — M25511 Pain in right shoulder: Secondary | ICD-10-CM

## 2022-03-19 DIAGNOSIS — M542 Cervicalgia: Secondary | ICD-10-CM | POA: Diagnosis not present

## 2022-03-19 MED ORDER — HYDROCODONE-ACETAMINOPHEN 5-325 MG PO TABS: 1.0000 | ORAL_TABLET | Freq: Three times a day (TID) | ORAL | 0 refills | Status: AC | PRN

## 2022-03-19 NOTE — Patient Instructions (Addendum)
Thank you for coming to the office today.  Referral to [facility] [place]  [address] Phone: [phone]  Ordered Hydrocodone-Acetaminophen take every 8 hour as needed prefer mostly at night.  Future if going to be a while before the apt, you can contact me back and we can do an injection R shoulder.   Please schedule a Follow-up Appointment to: Return if symptoms worsen or fail to improve.  If you have any other questions or concerns, please feel free to call the office or send a message through [place]. You may also schedule an earlier appointment if necessary.  Additionally, you may be receiving a survey about your experience at our office within a few days to 1 week by e-mail or mail. We value your feedback.  [name], DO [facility], [facility]   Range of Motion Shoulder Exercises  [place] with your good arm against a counter or table for support [facility] forward with a wide stance (make sure your body is comfortable) - Your painful shoulder should hang down and feel "heavy" - Gently move your painful arm in small circles "clockwise" for several turns - Switch to "counterclockwise" for several turns - Early on keep circles narrow and move slowly - Later in rehab, move in larger circles and faster movement   Wall Crawl - Stand close (about 1-2 ft away) to a wall, facing it directly - Reach out with your arm of painful shoulder and place fingers (not palm) on wall - You should make contact with wall at your waist level - Slowly walk your fingers up the wall. Stay in contact with wall entire time, do not remove fingers - Keep walking fingers up wall until you reach shoulder level - You may feel tightening or mild discomfort, once you reach a height that causes pain or if you are already above your shoulder height then stop. Repeat from starting position. -  Early on stand closer to wall, move fingers slowly, and stay at or below shoulder level - Later in rehab, stand farther away from wall (fingertips), move fingers quicker, go above shoulder level

## 2022-03-19 NOTE — Progress Notes (Signed)
Subjective:    Patient ID: Renee Boyd, female    DOB: November 04, 1950, 70 y.o.   MRN: [id number]  Renee Boyd is a 71 y.o. female presenting on 03/19/2022 for Shoulder Pain   HPI  Chronic Right Shoulder and Neck Pain Osteoarthritis, multiple joints Back Pain / Hip Pain / Shoulder pain Chronic problem. Episodic painful flares of joint pain, worse with inc activity and storms and weather changes Previously managed on Tramadol PRN for pain and this was renally adjusted to max of '50mg'$  BID - still has if needs, may request re order in future - she reports it has been ineffective now for shoulder pain  Cannot take NSAIDs due to CKD. On Gabapentin '300mg'$  daily doing well. Using baclofen '5mg'$  only max dose nightly She wakes up with pain worse at night R shoulder, has flares difficulty raising arm. She has had prior L shoulder injection here before with relief. But still has neck and back and shoulder pain, interested in orthopedic No recent x-ray imaging       03/20/2021   11:21 AM 12/21/2020   10:51 AM 06/27/2020   11:18 AM  Depression screen PHQ 2/9  Decreased Interest 0 0 0  Down, Depressed, Hopeless 0 0 0  PHQ - 2 Score 0 0 0  Altered sleeping  0   Tired, decreased energy  0   Change in appetite  0   Feeling bad or failure about yourself   0   Trouble concentrating  0   Moving slowly or fidgety/restless  0   Suicidal thoughts  0   PHQ-9 Score  0   Difficult doing work/chores  Not difficult at all     Social History   Tobacco Use   Smoking status: Former    Packs/day: 2.00    Years: 40.00    Total pack years: 80.00    Types: Cigarettes    Quit date: 06/27/2005    Years since quitting: 16.7   Smokeless tobacco: Former   Tobacco comments:    quit Jun 27 2005  Vaping Use   Vaping Use: Never used  Substance Use Topics   Alcohol use: No   Drug use: No    Review of Systems Per HPI unless specifically indicated above     Objective:    BP 139/66   Pulse 69   Ht  '4\' 11"'$  (1.499 m)   Wt 146 lb 12.8 oz (66.6 kg)   SpO2 96%   BMI 29.65 kg/m   Wt Readings from Last 3 Encounters:  03/19/22 146 lb 12.8 oz (66.6 kg)  01/01/22 148 lb 12.8 oz (67.5 kg)  12/25/21 150 lb (68 kg)    Physical Exam Vitals and nursing note reviewed.  Constitutional:      General: She is not in acute distress.    Appearance: Normal appearance. She is well-developed. She is not diaphoretic.     Comments: Well-appearing, comfortable, cooperative  HENT:     Head: Normocephalic and atraumatic.  Eyes:     General:        Right eye: No discharge.        Left eye: No discharge.     Conjunctiva/sclera: Conjunctivae normal.  Cardiovascular:     Rate and Rhythm: Normal rate.  Pulmonary:     Effort: Pulmonary effort is normal.  Musculoskeletal:     Comments: R Shoulder Inspection: Normal appearance bilateral symmetrical Palpation: Non-tender to palpation over anterior, lateral, or posterior shoulder  ROM: Reduced  range of motion forward extension Unable to test rotator cuff due to some pain Strength: Normal strength 5/5 flex/ext, ext rot / int rot, grip, rotator cuff str testing. Neurovascular: Distally intact pulses, sensation to light touch   Skin:    General: Skin is warm and dry.     Findings: No erythema or rash.  Neurological:     Mental Status: She is alert and oriented to person, place, and time.  Psychiatric:        Mood and Affect: Mood normal.        Behavior: Behavior normal.        Thought Content: Thought content normal.     Comments: Well groomed, good eye contact, normal speech and thoughts    Results for orders placed or performed in visit on 02/28/22  CUP PACEART REMOTE DEVICE CHECK  Result Value Ref Range   Date Time Interrogation Session [phone]    Pulse Generator Manufacturer MERM    Pulse Gen Model [id number] Advisa DR MRI    Pulse Gen Serial Number [id number]    Clinic Name [facility]    Implantable Pulse Generator Type Implantable  Pulse Generator    Implantable Pulse Generator Implant Date [id number]    Implantable Lead Manufacturer MERM    Implantable Lead Model 5076 CapSureFix Novus MRI SureScan    Implantable Lead Serial Number [id number]    Implantable Lead Implant Date [id number]    Implantable Lead Location Detail 1 UNKNOWN    Implantable Lead Location [id number]    Implantable Lead Manufacturer MERM    Implantable Lead Model 5076 CapSureFix Novus MRI SureScan    Implantable Lead Serial Number [id number]    Implantable Lead Implant Date [id number]    Implantable Lead Location Detail 1 UNKNOWN    Implantable Lead Location [id number]    Lead Channel Setting Sensing Sensitivity 0.9 mV   Lead Channel Setting Pacing Amplitude 1.75 V   Lead Channel Setting Pacing Pulse Width 0.4 ms   Lead Channel Setting Pacing Amplitude 2.5 V   Lead Channel Impedance Value 437 ohm   Lead Channel Impedance Value 418 ohm   Lead Channel Sensing Intrinsic Amplitude 3.375 mV   Lead Channel Sensing Intrinsic Amplitude 3.375 mV   Lead Channel Pacing Threshold Amplitude 0.375 V   Lead Channel Pacing Threshold Pulse Width 0.4 ms   Lead Channel Impedance Value 532 ohm   Lead Channel Impedance Value 494 ohm   Lead Channel Sensing Intrinsic Amplitude 17.625 mV   Lead Channel Sensing Intrinsic Amplitude 17.625 mV   Lead Channel Pacing Threshold Amplitude 0.75 V   Lead Channel Pacing Threshold Pulse Width 0.4 ms   Battery Status RRT    Battery Remaining Longevity 1 mo   Battery Voltage 2.82 V   Brady Statistic RA Percent Paced 99.87 %   Brady Statistic RV Percent Paced 99.99 %   Brady Statistic AP VP Percent 99.92 %   Brady Statistic AS VP Percent 0.07 %   Brady Statistic AP VS Percent 0 %   Brady Statistic AS VS Percent 0 %      Assessment & Plan:   Problem List Items Addressed This Visit     Chronic left-sided low back pain with left-sided sciatica   Relevant Medications   HYDROcodone-acetaminophen (NORCO/VICODIN) 5-325 MG tablet    Other Relevant Orders   Ambulatory referral to Orthopedic Surgery   Chronic right shoulder pain - Primary   Relevant Medications   HYDROcodone-acetaminophen (NORCO/VICODIN) 5-325 MG tablet   Other  Relevant Orders   Ambulatory referral to Orthopedic Surgery   Other Visit Diagnoses     Primary osteoarthritis involving multiple joints       Relevant Medications   HYDROcodone-acetaminophen (NORCO/VICODIN) 5-325 MG tablet   Other Relevant Orders   Ambulatory referral to Orthopedic Surgery   Chronic neck pain       Relevant Medications   HYDROcodone-acetaminophen (NORCO/VICODIN) 5-325 MG tablet   Other Relevant Orders   Ambulatory referral to Orthopedic Surgery      Chronic worsening R shoulder and neck pain, reduced range of motion, pain waking her up at night, with some bursitis or tendinopathy symptoms, has limitation due to Diabetes and Chronic Kidney DIsease, cannot take NSAID Ibuprofen, she has tried muscle relaxant, baclofen, gabapentin, tramadol without relief, requesting X-ray imaging and may warrant joint injection shoulders, and other eval of cervical spine    Referral to [facility] [place], [place]  [address] Phone: [phone]  Ordered Hydrocodone-Acetaminophen take every 8 hour as needed prefer mostly at night.  Future if going to be a while before the apt, you can contact me back and we can do an injection R shoulder.  Orders Placed This Encounter  Procedures   Ambulatory referral to Orthopedic Surgery    Referral Priority:   Routine    Referral Type:   Surgical    Referral Reason:   Specialty Services Required    Requested Specialty:   Orthopedic Surgery    Number of Visits Requested:   1     Meds ordered this encounter  Medications   HYDROcodone-acetaminophen (NORCO/VICODIN) 5-325 MG tablet    Sig: Take 1 tablet by mouth every 8 (eight) hours as needed for up to 5 days for moderate pain.     Dispense:  15 tablet    Refill:  0      Follow up plan: Return if symptoms worsen or fail to improve.   [name], [facility] 03/19/2022, 1:59 PM

## 2022-03-21 ENCOUNTER — Encounter: Payer: Medicare Other | Admitting: Internal Medicine

## 2022-03-21 NOTE — Progress Notes (Signed)
Remote pacemaker transmission.   

## 2022-03-25 ENCOUNTER — Ambulatory Visit (INDEPENDENT_AMBULATORY_CARE_PROVIDER_SITE_OTHER): Payer: Medicare Other

## 2022-03-25 DIAGNOSIS — Z0000 Encounter for general adult medical examination without abnormal findings: Secondary | ICD-10-CM

## 2022-03-25 NOTE — Progress Notes (Signed)
I connected with  Renee Boyd on 03/25/22 by a audio enabled telemedicine application and verified that I am speaking with the correct person using two identifiers.  Patient Location: Home  Provider Location: Office/Clinic  I discussed the limitations of evaluation and management by telemedicine. The patient expressed understanding and agreed to proceed.   Subjective:   Renee Boyd is a 71 y.o. female who presents for Medicare Annual (Subsequent) preventive examination.  Review of Systems    Per HPI unless specifically indicated below  Cardiac Risk Factors include: advanced age (>54mn, >>86women); female gender        Objective:       03/19/2022    1:32 PM 01/01/2022    9:19 AM 12/25/2021    9:18 AM  Vitals with BMI  Height '4\' 11"'$  '4\' 11"'$  '4\' 11"'$   Weight 146 lbs 13 oz 148 lbs 13 oz 150 lbs  BMI 29.63 [phone] Systolic [id number] Diastolic 66 90 70  Pulse 69 88 63     Today's Vitals   03/25/22 1419  PainSc: 4    There is no height or weight on file to calculate BMI.     03/20/2021   11:18 AM 10/25/2020    6:58 AM 03/14/2020    2:21 PM 08/11/2019    4:51 AM 08/10/2019   11:52 AM 02/09/2019    9:00 AM 05/28/2018    6:43 AM  Advanced Directives  Does Patient Have a Medical Advance Directive? Yes Yes Yes Yes No Yes Yes  Type of AParamed[place]of [place] will Living will [place] will Healthcare Power of Attorney  Living will;Healthcare Power of [place] will  Does patient want to make changes to medical advance directive?    No - Patient declined   No - Patient declined  Copy of [place] Chart? Yes - validated most recent copy scanned in chart (See row information)  Yes - validated most recent copy scanned in chart (See row information)   Yes - validated most recent copy scanned in chart (See row information) No - copy requested    Current Medications  (verified) Outpatient Encounter Medications as of 03/25/2022  Medication Sig   acetaminophen (TYLENOL) 500 MG tablet Take 1,000 mg by mouth every 8 (eight) hours as needed.   allopurinol (ZYLOPRIM) 100 MG tablet Take 1 tablet (100 mg total) by mouth daily.   amLODipine (NORVASC) 10 MG tablet TAKE 1 TABLET DAILY   apixaban (ELIQUIS) 5 MG TABS tablet TAKE 1 TABLET TWICE A DAY   ascorbic acid (VITAMIN C) 1000 MG tablet Take 1,000 mg by mouth daily.   atorvastatin (LIPITOR) 20 MG tablet TAKE 1 TABLET DAILY   Baclofen 5 MG TABS Take 5 mg by mouth at bedtime.   COMBIVENT RESPIMAT 20-100 MCG/ACT AERS respimat INHALE 1 PUFF EVERY 6 HOURS AS NEEDED WHEEZING/ SHORTNESS OF BREATH   CONTOUR NEXT TEST test strip Use to check blood sugar up to twice a day.   desonide (DESOWEN) 0.05 % lotion Apply topically 2 (two) times daily.   Dulaglutide 3 MG/0.5ML SOPN Inject into the skin every Tuesday.   furosemide (LASIX) 20 MG tablet TAKE 3 TABLETS ONCE DAILY (DOSE INCREASE)   JARDIANCE 10 MG TABS tablet Take 10 mg by mouth daily.   levothyroxine (SYNTHROID) 25 MCG tablet Take 1 tablet (25 mcg total) by mouth daily before breakfast.   metoprolol succinate (  TOPROL-XL) 50 MG 24 hr tablet Take 1 tablet (50 mg total) by mouth daily. Take with or immediately following a meal.   Multiple Vitamin (MULTIVITAMIN) capsule Take 1 capsule by mouth daily.   omeprazole (PRILOSEC) 20 MG capsule Take 1 capsule (20 mg total) by mouth 2 (two) times daily before a meal.   temazepam (RESTORIL) 30 MG capsule TAKE 1 CAPSULE BY MOUTH AT BEDTIME AS NEEDED SLEEP   No facility-administered encounter medications on file as of 03/25/2022.    Allergies (verified) Morphine and related, Neosporin [bacitracin-polymyxin b], Other, and Percocet [oxycodone-acetaminophen]   History: Past Medical History:  Diagnosis Date   Complete heart block ([place])    a. s/p MDT PPM 2006 with device generator replacement 2014; b. followed by Dr. Caryl Comes    Diabetes [facility])    GERD (gastroesophageal reflux disease)    Hypertension    Hypothyroid    Pacemaker    Persistent atrial fibrillation ([place])    a. on Eliquis; b. CHADS2VASc 6 (HTN. age x 1, DM, TIA x 2, female); c. s/p DCCV 05/28/18   Pulmonary hypertension ([place])    a. echo 2017: EF of 60-65%, no RWMA, mildly dilated left atrium, RVSF normal, PASP 64 mmHg   TIA (transient ischemic attack)    Past Surgical History:  Procedure Laterality Date   ABDOMINAL HYSTERECTOMY     CARDIOVERSION N/A 05/28/2018   Procedure: CARDIOVERSION;  Surgeon: Minna Merritts, MD;  Location: [facility] ORS;  Service: Cardiovascular;  Laterality: N/A;   CARDIOVERSION N/A 03/01/2020   Procedure: CARDIOVERSION;  Surgeon: Kate Sable, MD;  Location: [facility] ORS;  Service: Cardiovascular;  Laterality: N/A;   CARDIOVERSION N/A 10/25/2020   Procedure: CARDIOVERSION;  Surgeon: Kate Sable, MD;  Location: [facility] ORS;  Service: Cardiovascular;  Laterality: N/A;   CHOLECYSTECTOMY     TONSILLECTOMY     TUBAL LIGATION     Family History  Problem Relation Age of Onset   Hypertension Other    Heart disease Mother        MI   Alcohol abuse Father    Autism Son    Heart disease Maternal Grandmother    Cancer Son        colon   Miscarriages / Stillbirths Maternal Aunt    Social History   Socioeconomic History   Marital status: Divorced    Spouse name: Not on file   Number of children: 3   Years of education: Not on file   Highest education level: High school graduate  Occupational History   Occupation: retired  Tobacco Use   Smoking status: Former    Packs/day: 2.00    Years: 40.00    Total pack years: 80.00    Types: Cigarettes    Quit date: 06/27/2005    Years since quitting: 16.7   Smokeless tobacco: Former   Tobacco comments:    quit Jun 27 2005  Vaping Use   Vaping Use: Never used  Substance and Sexual Activity   Alcohol use: No   Drug use: No   Sexual activity: Not Currently  Other Topics  Concern   Not on file  Social History Narrative   Lives by self.   Social Determinants of Health   Financial Resource Strain: Low Risk  (03/25/2022)   Overall Financial Resource Strain (CARDIA)    Difficulty of Paying Living Expenses: Not hard at all  Food Insecurity: No Food Insecurity (03/25/2022)   Hunger Vital Sign    Worried About Running Out of Food  in the Last Year: Never true    [place] in the Last Year: Never true  Transportation Needs: No Transportation Needs (03/25/2022)   PRAPARE - Hydrologist (Medical): No    Lack of Transportation (Non-Medical): No  Physical Activity: Insufficiently Active (03/25/2022)   Exercise Vital Sign    Days of Exercise per Week: 3 days    Minutes of Exercise per Session: 20 min  Stress: No Stress Concern Present (03/25/2022)   [place]    Feeling of Stress : Only a little  Social Connections: Moderately Integrated (03/25/2022)   Social Connection and Isolation Panel [NHANES]    Frequency of Communication with Friends and Family: More than three times a week    Frequency of Social Gatherings with Friends and Family: More than three times a week    Attends Religious Services: More than 4 times per year    Active Member of [employer] or Organizations: Yes    Attends Music therapist: More than 4 times per year    Marital Status: Divorced    Tobacco Counseling Counseling given: Not Answered Tobacco comments: quit Jun 27 2005   Clinical Intake:  Pre-visit preparation completed: No  Pain : 0-10 Pain Score: 4  Pain Type: Acute pain Pain Location: Shoulder Pain Descriptors / Indicators: Aching, Radiating Pain Onset: 1 to 4 weeks ago Pain Frequency: Intermittent Pain Relieving Factors: pain is relieved with pain medication  Pain Relieving Factors: pain is relieved with pain medication  Nutritional Status: BMI 25 -29  Overweight Nutritional Risks: None Diabetes: Yes CBG done?: No Did pt. bring in CBG monitor from home?: No  How often do you need to have someone help you when you read instructions, pamphlets, or other written materials from your doctor or pharmacy?: 1 - Never  Diabetic? Nutrition Risk Assessment:  Has the patient had any N/V/D within the last 2 months?  No  Does the patient have any non-healing wounds?  No  Has the patient had any unintentional weight loss or weight gain?  No   Diabetes:  Is the patient diabetic?  Yes  If diabetic, was a CBG obtained today?  No  Did the patient bring in their glucometer from home?  No  How often do you monitor your CBG's? daily.   Financial Strains and Diabetes Management:  Are you having any financial strains with the device, your supplies or your medication? No .  Does the patient want to be seen by Chronic Care Management for management of their diabetes?  No  Would the patient like to be referred to a Nutritionist or for Diabetic Management?  No   Diabetic Exams:  Diabetic Eye Exam: Completed annually [facility]. I will request her Annual Eye Exam.  Diabetic Foot Exam: Completed 06/27/2021    Interpreter Needed?: No  Information entered by :: Donnie Mesa, [place]   Activities of Daily Living    03/25/2022    2:23 PM  In your present state of health, do you have any difficulty performing the following activities:  Hearing? 0  Vision? 1  Difficulty concentrating or making decisions? 0  Walking or climbing stairs? 0  Dressing or bathing? 0  Doing errands, shopping? 0    Patient Care Team: Olin Hauser, DO as PCP - General (Family Medicine) Lavonia Dana, MD as Consulting Physician (Nephrology) Deboraha Sprang, MD as Consulting Physician (Cardiology) Edrick Kins,  DPM as Financial risk analyst Physician Scientist, forensic)  Indicate any recent Medical Services you may have received from other than [facility] providers in the past  year (date may be approximate).    No hospitalization in the past 12 months.  Assessment:   This is a routine wellness examination for Renee Boyd.  Hearing/Vision screen Denies any hearing issues. Annual [facility]  Dietary issues and exercise activities discussed: Current Exercise Habits: Home exercise routine, Exercise limited by: Other - see comments (walking up stairs to get into the home. 18 steps to get to her apartment.)   Goals Addressed             This Visit's Progress    Activity and Exercise Increased       Evidence-based guidance:  Review current exercise levels.  Assess patient perspective on exercise or activity level, barriers to increasing activity, motivation and readiness for change.  Recommend or set healthy exercise goal based on individual tolerance.  Encourage small steps toward making change in amount of exercise or activity.  Urge reduction of sedentary activities or screen time.  Promote group activities within the community or with family or support person.  Consider referral to rehabiliation therapist for assessment and exercise/activity plan.   Notes:        Depression Screen    03/25/2022    2:10 PM 03/20/2021   11:21 AM 12/21/2020   10:51 AM 06/27/2020   11:18 AM 03/14/2020    2:25 PM 12/14/2019   11:01 AM 02/09/2019    8:51 AM  PHQ 2/9 Scores  PHQ - 2 Score 1 0 0 0 0 0 1  PHQ- 9 Score   0        Fall Risk    03/25/2022    2:22 PM 03/25/2022    2:21 PM 03/20/2021   11:19 AM 12/21/2020   10:50 AM 06/27/2020   11:18 AM  Fall Risk   Falls in the past year? 0 '1 1 1 '$ 0  Comment   tripped over sidewalk    Number falls in past yr: 1 0 0 0 0  Injury with Fall? 1 1 0 1 0  Comment    Fell coming into the office, tripped on the curb. Busted her lip. We cleaned it and placed ice on it   Risk for fall due to : Impaired mobility Medication side effect Medication side effect    Follow up Falls evaluation completed Falls  evaluation completed Falls evaluation completed;Education provided;Falls prevention discussed Falls evaluation completed Falls evaluation completed    FALL RISK PREVENTION PERTAINING TO THE HOME:  Any stairs in or around the home? Yes  If so, are there any without handrails? No  Home free of loose throw rugs in walkways, pet beds, electrical cords, etc? Yes  Adequate lighting in your home to reduce risk of falls? Yes   ASSISTIVE DEVICES UTILIZED TO PREVENT FALLS:  Life alert? No  Use of a cane, walker or w/c? No  Grab bars in the bathroom? No  Shower chair or bench in shower? No  Elevated toilet seat or a handicapped toilet? No   TIMED UP AND GO:  Was the test performed?  unable to perform because it virtual visit .  Cognitive Function:        03/25/2022    2:24 PM 03/20/2021   11:23 AM 03/14/2020    2:29 PM 02/09/2019    9:01 AM  6CIT Screen  What Year? 0 points  0 points 0 points 0 points  What month? 0 points 0 points 0 points 0 points  What time? 0 points 0 points 0 points 0 points  Count back from 20 0 points 0 points 2 points 0 points  Months in reverse 0 points 0 points 0 points 0 points  Repeat phrase 0 points 2 points 2 points 0 points  Total Score 0 points 2 points 4 points 0 points    Immunizations Immunization History  Administered Date(s) Administered   PFIZER Comirnaty(Gray Top)Covid-19 Tri-Sucrose Vaccine 10/18/2019, 11/09/2019   PFIZER(Purple Top)SARS-COV-2 Vaccination 10/18/2019, 11/09/2019, 07/11/2020   Pneumococcal Conjugate-13 09/10/2016   Pneumococcal Polysaccharide-23 09/16/2017   Tdap 04/06/2015    TDAP status: Up to date  Flu Vaccine status: Up to date  Pneumococcal vaccine status: Up to date  Covid-19 vaccine status: Completed vaccines  Qualifies for Shingles Vaccine? Yes   Zostavax completed No   Shingrix Completed?: No.    Education has been provided regarding the importance of this vaccine. Patient has been advised to call insurance  company to determine out of pocket expense if they have not yet received this vaccine. Advised may also receive vaccine at local pharmacy or Health Dept. Verbalized acceptance and understanding.  Screening Tests Health Maintenance  Topic Date Due   Zoster Vaccines- Shingrix (1 of 2) Never done   COVID-19 Vaccine (6 - Pfizer risk series) 09/05/2020   OPHTHALMOLOGY EXAM  01/30/2022   INFLUENZA VACCINE  03/05/2022   MAMMOGRAM  12/26/2022 (Originally 04/11/1999)   FOOT EXAM  06/27/2022   HEMOGLOBIN A1C  06/27/2022   URINE MICROALBUMIN  09/12/2022   COLONOSCOPY (Pts 45-67yr Insurance coverage will need to be confirmed)  07/05/2024   TETANUS/TDAP  04/05/2025   Pneumonia Vaccine 71 Years old  Completed   DEXA SCAN  Completed   Hepatitis C Screening  Completed   HPV VACCINES  Aged Out    Health Maintenance  Health Maintenance Due  Topic Date Due   Zoster Vaccines- Shingrix (1 of 2) Never done   COVID-19 Vaccine (6 - Pfizer risk series) 09/05/2020   OPHTHALMOLOGY EXAM  01/30/2022   INFLUENZA VACCINE  03/05/2022    Colorectal cancer screening: Type of screening: Colonoscopy. Completed 07/05/2014. Repeat every 10 years  Mammogram status: No longer required due to age.  DEXA Scan: completed 01/13/2017  Lung Cancer Screening: (Low Dose CT Chest recommended if Age 73-80years, 30 pack-year currently smoking OR have quit w/in 15years.) does not qualify.   Lung Cancer Screening Referral: not required   Additional Screening:  Hepatitis C Screening: does qualify; Completed 09/10/2016   Vision Screening: Recommended annual ophthalmology exams for early detection of glaucoma and other disorders of the eye. Is the patient up to date with their annual eye exam?  Yes  Who is the provider or what is the name of the [facility] which the patient [facility] If pt is not established with a provider, would they like to be referred to a provider to establish care?  No .   Dental Screening: Recommended annual dental exams for proper oral hygiene  Community Resource Referral / Chronic Care Management: CRR required this visit?  No   CCM required this visit?  No      Plan:     I have personally reviewed and noted the following in the patient's chart:   Medical and social history Use of alcohol, tobacco or illicit drugs  Current medications and supplements including  opioid prescriptions. Patient is currently taking opioid prescriptions. Information provided to patient regarding non-opioid alternatives. Patient advised to discuss non-opioid treatment plan with their provider. Functional ability and status Nutritional status Physical activity Advanced directives List of other physicians Hospitalizations, surgeries, and ER visits in previous 12 months Vitals Screenings to include cognitive, depression, and falls Referrals and appointments  In addition, I have reviewed and discussed with patient certain preventive protocols, quality metrics, and best practice recommendations. A written personalized care plan for preventive services as well as general preventive health recommendations were provided to patient.    Renee Boyd , Thank you for taking time to come for your Medicare Wellness Visit. I appreciate your ongoing commitment to your health goals. Please review the following plan we discussed and let me know if I can assist you in the future.   These are the goals we discussed:  Goals      Activity and Exercise Increased     Evidence-based guidance:  Review current exercise levels.  Assess patient perspective on exercise or activity level, barriers to increasing activity, motivation and readiness for change.  Recommend or set healthy exercise goal based on individual tolerance.  Encourage small steps toward making change in amount of exercise or activity.  Urge reduction of sedentary activities or screen time.  Promote group activities  within the community or with family or support person.  Consider referral to rehabiliation therapist for assessment and exercise/activity plan.   Notes:      DIET - INCREASE WATER INTAKE     Recommend drinking at least 6- 8 glasses of water a day      Patient Stated     03/14/2020,wants to get sugars under control     Patient Stated     03/20/2021, no goals        This is a list of the screening recommended for you and due dates:  Health Maintenance  Topic Date Due   Zoster (Shingles) Vaccine (1 of 2) Never done   COVID-19 Vaccine (6 - Pfizer risk series) 09/05/2020   Eye exam for diabetics  01/30/2022   Flu Shot  03/05/2022   Mammogram  12/26/2022*   Complete foot exam   06/27/2022   Hemoglobin A1C  06/27/2022   Urine Protein Check  09/12/2022   Colon Cancer Screening  07/05/2024   Tetanus Vaccine  04/05/2025   Pneumonia Vaccine  Completed   DEXA scan (bone density measurement)  Completed   Hepatitis C Screening: USPSTF Recommendation to screen - Ages 62-79 yo.  Completed   HPV Vaccine  Aged Out  *Topic was postponed. The date shown is not the original due date.     Wilson Singer, [place]   03/25/2022   Nurse Notes: 40 minute Non-face-to-face

## 2022-03-25 NOTE — Patient Instructions (Signed)

## 2022-03-26 ENCOUNTER — Ambulatory Visit: Payer: Medicare Other

## 2022-03-29 ENCOUNTER — Telehealth: Payer: Self-pay | Admitting: Family Medicine

## 2022-03-29 DIAGNOSIS — G8929 Other chronic pain: Secondary | ICD-10-CM

## 2022-03-29 NOTE — Telephone Encounter (Signed)
Tried calling; pt's voicemail is not set up.  PEC please advise pt when she calls back.   Thanks,   -Mickel Baas

## 2022-03-29 NOTE — Telephone Encounter (Signed)
I reviewed his last note.  He gave a limited supply of hydrocodone and recommended if the appointment was not made soon that he would prefer if she came back so that he can do a shoulder injection.  I recommend she make an appointment with him for next week.

## 2022-03-29 NOTE — Telephone Encounter (Signed)
Pt called saying [facility] has not called her for an appt yet for her pain.  She has 2 pills left for pain and wants to know if she can get a refill on the [employer]  [phone]  [phone]

## 2022-03-29 NOTE — Telephone Encounter (Signed)
Patient returned call back. I couldn't get thru. Please call back

## 2022-04-01 ENCOUNTER — Ambulatory Visit (INDEPENDENT_AMBULATORY_CARE_PROVIDER_SITE_OTHER): Payer: Medicare Other

## 2022-04-01 DIAGNOSIS — I442 Atrioventricular block, complete: Secondary | ICD-10-CM

## 2022-04-02 LAB — CUP PACEART REMOTE DEVICE CHECK
Battery Remaining Longevity: 1 mo
Battery Voltage: 2.82 V
Brady Statistic AP VP Percent: 99.76 %
Brady Statistic AP VS Percent: 0 %
Brady Statistic AS VP Percent: 0.24 %
Brady Statistic AS VS Percent: 0 %
Brady Statistic RA Percent Paced: 99.58 %
Brady Statistic RV Percent Paced: 99.99 %
Date Time Interrogation Session: 20230828140826
Implantable Lead Implant Date: 20141217
Implantable Lead Implant Date: 20141217
Implantable Lead Location: 753859
Implantable Lead Location: 753860
Implantable Lead Model: 5076
Implantable Lead Model: 5076
Implantable Pulse Generator Implant Date: 20141217
Lead Channel Impedance Value: 437 Ohm
Lead Channel Impedance Value: 437 Ohm
Lead Channel Impedance Value: 494 Ohm
Lead Channel Impedance Value: 513 Ohm
Lead Channel Pacing Threshold Amplitude: 0.375 V
Lead Channel Pacing Threshold Amplitude: 0.75 V
Lead Channel Pacing Threshold Pulse Width: 0.4 ms
Lead Channel Pacing Threshold Pulse Width: 0.4 ms
Lead Channel Sensing Intrinsic Amplitude: 21.375 mV
Lead Channel Sensing Intrinsic Amplitude: 21.375 mV
Lead Channel Sensing Intrinsic Amplitude: 3.5 mV
Lead Channel Sensing Intrinsic Amplitude: 3.5 mV
Lead Channel Setting Pacing Amplitude: 1.75 V
Lead Channel Setting Pacing Amplitude: 2.5 V
Lead Channel Setting Pacing Pulse Width: 0.4 ms
Lead Channel Setting Sensing Sensitivity: 0.9 mV

## 2022-04-04 ENCOUNTER — Ambulatory Visit: Payer: Medicare Other | Attending: Internal Medicine | Admitting: Internal Medicine

## 2022-04-04 ENCOUNTER — Encounter: Payer: Self-pay | Admitting: Internal Medicine

## 2022-04-04 VITALS — BP 140/82 | HR 71 | Ht 59.0 in | Wt 147.5 lb

## 2022-04-04 DIAGNOSIS — I493 Ventricular premature depolarization: Secondary | ICD-10-CM | POA: Insufficient documentation

## 2022-04-04 DIAGNOSIS — I4819 Other persistent atrial fibrillation: Secondary | ICD-10-CM | POA: Insufficient documentation

## 2022-04-04 DIAGNOSIS — Z01812 Encounter for preprocedural laboratory examination: Secondary | ICD-10-CM | POA: Diagnosis not present

## 2022-04-04 DIAGNOSIS — I442 Atrioventricular block, complete: Secondary | ICD-10-CM | POA: Diagnosis not present

## 2022-04-04 DIAGNOSIS — Z950 Presence of cardiac pacemaker: Secondary | ICD-10-CM | POA: Insufficient documentation

## 2022-04-04 DIAGNOSIS — I10 Essential (primary) hypertension: Secondary | ICD-10-CM | POA: Diagnosis not present

## 2022-04-04 MED ORDER — HYDROCODONE-ACETAMINOPHEN 5-325 MG PO TABS: 1.0000 | ORAL_TABLET | Freq: Three times a day (TID) | ORAL | 0 refills | Status: DC | PRN

## 2022-04-04 NOTE — Progress Notes (Signed)
Patient Care Team: Olin Hauser, DO as PCP - General (Family Medicine) Lavonia Dana, MD as Consulting Physician (Nephrology) Deboraha Sprang, MD as Consulting Physician (Cardiology) Edrick Kins, DPM as Consulting Physician (Podiatry)   HPI  Renee Boyd is a 71 y.o. female Seen in followup for largely asymptomatic persistent atrial fibrillation and previously implanted pacemaker 2014 [employer]tr[employer].  Device now at RRT  Anticoagulation  w [place]      Previously exposed to amiodarone when started [place].   Underwent cardioversion 7/21 with significant improvement in symptoms.  She reverted back to atrial fib/flutter shortly after her last office visit.  Anticipated repeat cardioversion following reloading with amiodarone to assess benefit of restoring sinus rhythm done 3/22; not tolerated   Functional status is stable.  Denies significant shortness of breath.  No edema.  No chest pain.  Records and Results Reviewed Date Cr K Hgb  10/17 1.36  8.8  2/18 1.73 4.9   6/18  1.3  11.1  1/20 1.2 4.3 11.3  1/21 1.32 3.2 9.6  7/21 2.37 4.2 12.4  8/21 2.64 (CE)    12/21 2.64 (CE)     3/22 1.9. 4.5 12.1  9/22 1.78 4.4 12.0  11/22 2.35 4.2   2/23 2.53 4.4   5/23 2.09<< 3.16 4.1 12.8        DATE TEST EF   7/17 Echo   60-65 % Pulm HTN 64 (no E/E')  12/19  Echo  50-55% LVH severe (17/16)   PAsys 50-55   MR mild LAE(37/2.0/34)  1/20 CTA  CAD 2V   FFR > 0.8 LAD/RCA  5/22 Myoview  75% No ischemia          .   Thromboembolic risk factors ( age -70, HTN-1, TIA/CVA-2, DM-1 , Gender-1) for a CHADSVASc Score of 6     Past Medical History:  Diagnosis Date   Complete heart block ([place])    a. s/p MDT PPM 2006 with device generator replacement 2014; b. followed by Dr. Caryl Comes   Diabetes [facility])    GERD (gastroesophageal reflux disease)    Hypertension    Hypothyroid    Pacemaker    Persistent atrial fibrillation ([place])    a. on Eliquis; b. CHADS2VASc 6  (HTN. age x 1, DM, TIA x 2, female); c. s/p DCCV 05/28/18   Pulmonary hypertension ([place])    a. echo 2017: EF of 60-65%, no RWMA, mildly dilated left atrium, RVSF normal, PASP 64 mmHg   TIA (transient ischemic attack)     Past Surgical History:  Procedure Laterality Date   ABDOMINAL HYSTERECTOMY     CARDIOVERSION N/A 05/28/2018   Procedure: CARDIOVERSION;  Surgeon: Minna Merritts, MD;  Location: [facility] ORS;  Service: Cardiovascular;  Laterality: N/A;   CARDIOVERSION N/A 03/01/2020   Procedure: CARDIOVERSION;  Surgeon: Kate Sable, MD;  Location: [facility] ORS;  Service: Cardiovascular;  Laterality: N/A;   CARDIOVERSION N/A 10/25/2020   Procedure: CARDIOVERSION;  Surgeon: Kate Sable, MD;  Location: [facility] ORS;  Service: Cardiovascular;  Laterality: N/A;   CHOLECYSTECTOMY     TONSILLECTOMY     TUBAL LIGATION      Current Outpatient Medications  Medication Sig Dispense Refill   acetaminophen (TYLENOL) 500 MG tablet Take 1,000 mg by mouth every 8 (eight) hours as needed.     allopurinol (ZYLOPRIM) 100 MG tablet Take 1 tablet (100 mg total) by mouth daily. 90 tablet 3   amLODipine (NORVASC) 10 MG  tablet TAKE 1 TABLET DAILY 90 tablet 3   apixaban (ELIQUIS) 5 MG TABS tablet TAKE 1 TABLET TWICE A DAY 180 tablet 1   ascorbic acid (VITAMIN C) 1000 MG tablet Take 1,000 mg by mouth daily.     atorvastatin (LIPITOR) 20 MG tablet TAKE 1 TABLET DAILY 90 tablet 3   Baclofen 5 MG TABS Take 5 mg by mouth at bedtime. 90 tablet 3   COMBIVENT RESPIMAT 20-100 MCG/ACT AERS respimat INHALE 1 PUFF EVERY 6 HOURS AS NEEDED WHEEZING/ SHORTNESS OF BREATH 4 g 2   CONTOUR NEXT TEST test strip Use to check blood sugar up to twice a day. 200 each 5   desonide (DESOWEN) 0.05 % lotion Apply topically 2 (two) times daily. 59 mL 0   Dulaglutide 3 MG/0.5ML SOPN Inject into the skin every Tuesday.     furosemide (LASIX) 20 MG tablet TAKE 3 TABLETS ONCE DAILY (DOSE INCREASE) 270 tablet 3   JARDIANCE 10 MG TABS  tablet Take 10 mg by mouth daily.     levothyroxine (SYNTHROID) 25 MCG tablet Take 1 tablet (25 mcg total) by mouth daily before breakfast. 90 tablet 1   metoprolol succinate (TOPROL-XL) 50 MG 24 hr tablet Take 1 tablet (50 mg total) by mouth daily. Take with or immediately following a meal. 90 tablet 3   Multiple Vitamin (MULTIVITAMIN) capsule Take 1 capsule by mouth daily.     omeprazole (PRILOSEC) 20 MG capsule Take 1 capsule (20 mg total) by mouth 2 (two) times daily before a meal. 180 capsule 2   temazepam (RESTORIL) 30 MG capsule TAKE 1 CAPSULE BY MOUTH AT BEDTIME AS NEEDED SLEEP 30 capsule 2   No current facility-administered medications for this visit.    Allergies  Allergen Reactions   Morphine And Related Itching   Neosporin [Bacitracin-Polymyxin B]    Other     glue   Percocet [Oxycodone-Acetaminophen] Other (See Comments)    Hallucinations       Review of Systems negative except from HPI and PMH  Physical Exam  BP (!) 140/82 (BP Location: Left Arm, Patient Position: Sitting, Cuff Size: Normal)   Pulse 71   Ht '4\' 11"'$  (1.499 m)   Wt 147 lb 8 oz (66.9 kg)   SpO2 92%   BMI 29.79 kg/m  Well developed and well nourished in no acute distress HENT normal Neck supple with JVP-flat Clear Device pocket well healed; without hematoma or erythema.  There is no tethering  Regular rate and rhythm, no  gallop No murmur Abd-soft with active BS No Clubbing cyanosis  edema Skin-warm and dry A & Oriented  Grossly normal sensory and motor function  ECG AV pacing   Assessment and  Plan  Nausea and vomiting  Atrial fibrillation/flutter long-term persistent  Ventricular tachycardia-nonsustained  HFpEF chronic  Hypertension   Pacemaker  Medtronic   Sinus arrest  Complete heart block  Renal insufficiency  gr 4 ( CrCL 24)   Orthostatic hypotension  PVCs  Sleep disordered breathing and daytime somnolence averse to a sleep study  No interval atrial fibrillation.   We will follow, if recurrent will consider catheter ablation.  Continue anticoagulation with apixaban 5 mg twice daily  Device is reached RRT.  We will undertake device generator replacement; symptoms are stable; LVEF 5/22 hyperdynamic by Myoview  We have reviewed the benefits and risks of generator replacement.  These include but are not limited to lead fracture and infection.  The patient understands, agrees and is willing  to proceed.  As this is her third procedure we will use an antimicrobial pouch.  Blood pressure is reasonably controlled; we will continue to allow nephrology to manage blood pressure

## 2022-04-04 NOTE — Patient Instructions (Addendum)
Medication Instructions:  - Your physician recommends that you continue on your current medications as directed. Please refer to the Current Medication list given to you today.  *If you need a refill on your cardiac medications before your next appointment, please call your pharmacy*   Lab Work: - Your physician recommends that you return for lab work anytime between: today (8/31)- Tuesday (9/26)  BMP/ [place] Entrance at [facility] 1st desk on the right to check in (REGISTRATION)  Lab hours: Monday- Friday (7:30 am- 5:30 pm)  If you have labs (blood work) drawn today and your tests are completely normal, you will receive your results only by: MyChart Message (if you have MyChart) OR A paper copy in the mail If you have any lab test that is abnormal or we need to change your treatment, we will call you to review the results.   Testing/Procedures:  1) Your physician has recommended that you have a pacemaker generator (battery). Please see the instruction sheet given to you today for more information.   Implantable Device Instructions    You are scheduled for a PPM generator change on Thursday, September 28 with Dr. Virl Axe.  1. Pre procedure Lab testing: You will get your lab work at [facility]) hospital.  Your lab work will be done at the [facility] next to the [employer].  These are walk in labs- you will not need an appointment and you do not need to be fasting.    2. Please arrive at the [facility] on September 28 at 6:30 AM (This time is one hour before your procedure to ensure your preparation). Free valet parking service is available. You will check in at REGISTRATION (1st desk on the right). The support person will be asked to wait in the waiting room.  It is OK to have someone drop you off and come back when you are ready to be discharged.        Special note: Every effort is made to have your procedure done on time. Please  understand that emergencies sometimes delay  scheduled procedures.  3.  No eating or drinking after midnight prior to procedure.     4.  Medication instructions:    HOLD Eliquis the night before & morning of your procedure HOLD Lasix (furosemide) & Jardiance the morning of your procedure You may take all of your other regular morning medications, not listed above, the day of your procedure with enough water to get them down safely.    5.  The night before your procedure and the morning of your procedure scrub your neck/chest with CHG surgical scrub.  See instruction's below.  6. Plan to go home the same day, you will only stay overnight if medically necessary. 7.  You MUST have a responsible adult to drive you home. 8.   An adult MUST be with you the first 24 hours after you arrive home. 68..  Bring a current list of your medications, and the last time and date medication taken. 10. Bring ID and current insurance cards. 11. .Please wear clothes that are easy to get on and off and wear slip-on shoes.    You will follow up with the [place] clinic 10-14 days after your procedure.  You will follow up with Dr. Virl Axe 91 days after your procedure.  These appointments will be made for you.   * If you have ANY questions after you get  home, please call the office at [phone] or send a MyChart message.  FYI: For your safety, and to allow [place] to monitor your vital signs accurately during the surgery/procedure we request that if you have artificial nails, gel coating, SNS etc. Please have those removed prior to your surgery/procedure. Not having the nail coverings /polish removed may result in cancellation or delay of your surgery/procedure.    [facility] - Preparing For Surgery    Before surgery, you can play an important role. Because skin is not sterile, your skin needs to be as free of germs as possible. You can reduce the number of germs on your skin by washing with CHG  (chlorahexidine gluconate) Soap before surgery.  CHG is an antiseptic cleaner which kills germs and bonds with the skin to continue killing germs even after washing.  Please do not use if you have an allergy to CHG or antibacterial soaps.  If your skin becomes reddened/irritated stop using the CHG.   Do not shave (including legs and underarms) for at least 48 hours prior to first CHG shower.  It is OK to shave your face.  Please follow these instructions carefully:  1.  Shower the night before surgery and the morning of surgery with CHG.  2.  If you choose to wash your hair, wash your hair first as usual with your normal shampoo.  3.  After you shampoo, rinse your hair and body thoroughly to remove the shampoo.  4.  Use CHG as you would any other liquid soap.  You can apply CHG directly to the skin and wash gently with a clean washcloth. 5.  Apply the CHG Soap to your body ONLY FROM THE NECK DOWN.  Do not use on open wounds or open sores.  Avoid contact with your eyes, ears, mouth and genitals (private parts).  Wash genitals (private parts) with your normal soap.  6.  Wash thoroughly, paying special attention to the area where your surgery will be performed.  7.  Thoroughly rinse your body with warm water from the neck down.   8.  DO NOT shower/wash with your normal soap after using and rinsing off the CHG soap.  9.  Pat yourself dry with a clean towel.           10.  Wear clean pajamas.           11.  Place clean sheets on your bed the night of your first shower and do not sleep with pets.  Day of Surgery: Do not apply any deodorants/lotions.  Please wear clean clothes to the hospital/surgery center.   Follow-Up: At [facility], you and your health needs are our priority.  As part of our continuing mission to provide you with exceptional heart care, we have created designated Provider Care Teams.  These Care Teams include your primary Cardiologist (physician) and Advanced Practice  Providers (APPs -  Physician Assistants and Nurse Practitioners) who all work together to provide you with the care you need, when you need it.  We recommend signing up for the patient portal called "MyChart".  Sign up information is provided on this After Visit Summary.  MyChart is used to connect with patients for Virtual Visits (Telemedicine).  Patients are able to view lab/test results, encounter notes, upcoming appointments, etc.  Non-urgent messages can be sent to your provider as well.   To learn more about what you can do with MyChart, go to [email or web address].    Your  next appointment:   1) 10-14 days (from 05/02/22) with the [facility] for a [place] office   2) 91 days (from 05/02/22) with Dr. Caryl Comes  The format for your next appointment:   In Person  Provider:   As above     Other Instructions N/a  Important Information About Sugar

## 2022-04-04 NOTE — Telephone Encounter (Signed)
Agreed to refill Hydrocodone #15 pill count, she should follow up next with Orthopedics, we referred her on 03/19/22. If she cannot get in to Ortho sooner, I can see her back for injection, but I would prefer her to see Ortho next.  [name], [facility] 04/04/2022, 10:36 AM

## 2022-04-04 NOTE — H&P (View-Only) (Signed)
Patient Care Team: Olin Hauser, DO as PCP - General (Family Medicine) Lavonia Dana, MD as Consulting Physician (Nephrology) Deboraha Sprang, MD as Consulting Physician (Cardiology) Edrick Kins, DPM as Consulting Physician (Podiatry)   HPI  Renee Boyd is a 71 y.o. female Seen in followup for largely asymptomatic persistent atrial fibrillation and previously implanted pacemaker 2014 Medtronic.  Device now at RRT  Anticoagulation  w [place]      Previously exposed to amiodarone when started [place].   Underwent cardioversion 7/21 with significant improvement in symptoms.  She reverted back to atrial fib/flutter shortly after her last office visit.  Anticipated repeat cardioversion following reloading with amiodarone to assess benefit of restoring sinus rhythm done 3/22; not tolerated   Functional status is stable.  Denies significant shortness of breath.  No edema.  No chest pain.  Records and Results Reviewed Date Cr K Hgb  10/17 1.36  8.8  2/18 1.73 4.9   6/18  1.3  11.1  1/20 1.2 4.3 11.3  1/21 1.32 3.2 9.6  7/21 2.37 4.2 12.4  8/21 2.64 (CE)    12/21 2.64 (CE)     3/22 1.9. 4.5 12.1  9/22 1.78 4.4 12.0  11/22 2.35 4.2   2/23 2.53 4.4   5/23 2.09<< 3.16 4.1 12.8        DATE TEST EF   7/17 Echo   60-65 % Pulm HTN 64 (no E/E')  12/19  Echo  50-55% LVH severe (17/16)   PAsys 50-55   MR mild LAE(37/2.0/34)  1/20 CTA  CAD 2V   FFR > 0.8 LAD/RCA  5/22 Myoview  75% No ischemia          .   Thromboembolic risk factors ( age -40, HTN-1, TIA/CVA-2, DM-1 , Gender-1) for a CHADSVASc Score of 6     Past Medical History:  Diagnosis Date   Complete heart block ([place])    a. s/p MDT PPM 2006 with device generator replacement 2014; b. followed by Dr. Caryl Comes   Diabetes [facility])    GERD (gastroesophageal reflux disease)    Hypertension    Hypothyroid    Pacemaker    Persistent atrial fibrillation ([place])    a. on Eliquis; b. CHADS2VASc 6  (HTN. age x 1, DM, TIA x 2, female); c. s/p DCCV 05/28/18   Pulmonary hypertension ([place])    a. echo 2017: EF of 60-65%, no RWMA, mildly dilated left atrium, RVSF normal, PASP 64 mmHg   TIA (transient ischemic attack)     Past Surgical History:  Procedure Laterality Date   ABDOMINAL HYSTERECTOMY     CARDIOVERSION N/A 05/28/2018   Procedure: CARDIOVERSION;  Surgeon: Minna Merritts, MD;  Location: [facility] ORS;  Service: Cardiovascular;  Laterality: N/A;   CARDIOVERSION N/A 03/01/2020   Procedure: CARDIOVERSION;  Surgeon: Kate Sable, MD;  Location: [facility] ORS;  Service: Cardiovascular;  Laterality: N/A;   CARDIOVERSION N/A 10/25/2020   Procedure: CARDIOVERSION;  Surgeon: Kate Sable, MD;  Location: [facility] ORS;  Service: Cardiovascular;  Laterality: N/A;   CHOLECYSTECTOMY     TONSILLECTOMY     TUBAL LIGATION      Current Outpatient Medications  Medication Sig Dispense Refill   acetaminophen (TYLENOL) 500 MG tablet Take 1,000 mg by mouth every 8 (eight) hours as needed.     allopurinol (ZYLOPRIM) 100 MG tablet Take 1 tablet (100 mg total) by mouth daily. 90 tablet 3   amLODipine (NORVASC) 10 MG  tablet TAKE 1 TABLET DAILY 90 tablet 3   apixaban (ELIQUIS) 5 MG TABS tablet TAKE 1 TABLET TWICE A DAY 180 tablet 1   ascorbic acid (VITAMIN C) 1000 MG tablet Take 1,000 mg by mouth daily.     atorvastatin (LIPITOR) 20 MG tablet TAKE 1 TABLET DAILY 90 tablet 3   Baclofen 5 MG TABS Take 5 mg by mouth at bedtime. 90 tablet 3   COMBIVENT RESPIMAT 20-100 MCG/ACT AERS respimat INHALE 1 PUFF EVERY 6 HOURS AS NEEDED WHEEZING/ SHORTNESS OF BREATH 4 g 2   CONTOUR NEXT TEST test strip Use to check blood sugar up to twice a day. 200 each 5   desonide (DESOWEN) 0.05 % lotion Apply topically 2 (two) times daily. 59 mL 0   Dulaglutide 3 MG/0.5ML SOPN Inject into the skin every Tuesday.     furosemide (LASIX) 20 MG tablet TAKE 3 TABLETS ONCE DAILY (DOSE INCREASE) 270 tablet 3   JARDIANCE 10 MG TABS  tablet Take 10 mg by mouth daily.     levothyroxine (SYNTHROID) 25 MCG tablet Take 1 tablet (25 mcg total) by mouth daily before breakfast. 90 tablet 1   metoprolol succinate (TOPROL-XL) 50 MG 24 hr tablet Take 1 tablet (50 mg total) by mouth daily. Take with or immediately following a meal. 90 tablet 3   Multiple Vitamin (MULTIVITAMIN) capsule Take 1 capsule by mouth daily.     omeprazole (PRILOSEC) 20 MG capsule Take 1 capsule (20 mg total) by mouth 2 (two) times daily before a meal. 180 capsule 2   temazepam (RESTORIL) 30 MG capsule TAKE 1 CAPSULE BY MOUTH AT BEDTIME AS NEEDED SLEEP 30 capsule 2   No current facility-administered medications for this visit.    Allergies  Allergen Reactions   Morphine And Related Itching   Neosporin [Bacitracin-Polymyxin B]    Other     glue   Percocet [Oxycodone-Acetaminophen] Other (See Comments)    Hallucinations       Review of Systems negative except from HPI and PMH  Physical Exam  BP (!) 140/82 (BP Location: Left Arm, Patient Position: Sitting, Cuff Size: Normal)   Pulse 71   Ht '4\' 11"'$  (1.499 m)   Wt 147 lb 8 oz (66.9 kg)   SpO2 92%   BMI 29.79 kg/m  Well developed and well nourished in no acute distress HENT normal Neck supple with JVP-flat Clear Device pocket well healed; without hematoma or erythema.  There is no tethering  Regular rate and rhythm, no  gallop No murmur Abd-soft with active BS No Clubbing cyanosis  edema Skin-warm and dry A & Oriented  Grossly normal sensory and motor function  ECG AV pacing   Assessment and  Plan  Nausea and vomiting  Atrial fibrillation/flutter long-term persistent  Ventricular tachycardia-nonsustained  HFpEF chronic  Hypertension   Pacemaker  [employer]tronic   Sinus arrest  Complete heart block  Renal insufficiency  gr 4 ( CrCL 24)   Orthostatic hypotension  PVCs  Sleep disordered breathing and daytime somnolence averse to a sleep study  No interval atrial fibrillation.   We will follow, if recurrent will consider catheter ablation.  Continue anticoagulation with apixaban 5 mg twice daily  Device is reached RRT.  We will undertake device generator replacement; symptoms are stable; LVEF 5/22 hyperdynamic by Myoview  We have reviewed the benefits and risks of generator replacement.  These include but are not limited to lead fracture and infection.  The patient understands, agrees and is willing  to proceed.  As this is her third procedure we will use an antimicrobial pouch.  Blood pressure is reasonably controlled; we will continue to allow nephrology to manage blood pressure

## 2022-04-04 NOTE — Addendum Note (Signed)
Addended by: Olin Hauser on: 04/04/2022 10:37 AM   Modules accepted: Orders

## 2022-04-05 ENCOUNTER — Other Ambulatory Visit: Payer: Self-pay

## 2022-04-05 DIAGNOSIS — E1169 Type 2 diabetes mellitus with other specified complication: Secondary | ICD-10-CM

## 2022-04-05 MED ORDER — ALLOPURINOL 100 MG PO TABS: 100.0000 mg | ORAL_TABLET | Freq: Every day | ORAL | 3 refills | Status: DC

## 2022-04-10 ENCOUNTER — Other Ambulatory Visit: Payer: Self-pay

## 2022-04-10 DIAGNOSIS — E039 Hypothyroidism, unspecified: Secondary | ICD-10-CM

## 2022-04-10 MED ORDER — LEVOTHYROXINE SODIUM 25 MCG PO TABS: 25.0000 ug | ORAL_TABLET | Freq: Every day | ORAL | 1 refills | Status: DC

## 2022-04-11 ENCOUNTER — Other Ambulatory Visit: Payer: Self-pay | Admitting: Family Medicine

## 2022-04-11 DIAGNOSIS — R0609 Other forms of dyspnea: Secondary | ICD-10-CM

## 2022-04-12 NOTE — Telephone Encounter (Signed)
Requested Prescriptions  Pending Prescriptions Disp Refills  . COMBIVENT RESPIMAT 20-100 MCG/ACT AERS respimat [Pharmacy Med Name: COMBIVENT RESPIMAT 20-100 MCG/ACT I] 4 g 2    Sig: INHALE 1 PUFF EVERY 6 HOURS AS NEEDED FOR WHEEZING/SOB     Pulmonology:  Combination Products - albuterol / ipratropium Failed - 04/11/2022  2:48 PM      Failed - Last BP in normal range    BP Readings from Last 1 Encounters:  04/04/22 (!) 140/82         Passed - Last Heart Rate in normal range    Pulse Readings from Last 1 Encounters:  04/04/22 71         Passed - Valid encounter within last 12 months    Recent Outpatient Visits          3 weeks ago Chronic right shoulder pain   [facility], Devonne Doughty, DO   3 months ago Type 2 diabetes mellitus with hyperlipidemia [facility])   [facility], Devonne Doughty, DO   9 months ago Benign hypertension with CKD (chronic kidney disease) stage IV [facility])   [facility] Olin Hauser, DO   1 year ago Benign hypertension with CKD (chronic kidney disease) stage IV [facility])   [facility] Olin Hauser, DO   1 year ago Benign hypertension with CKD (chronic kidney disease) stage IV [facility])   [facility] [place], Devonne Doughty, DO      Future Appointments            In 2 months [place], Devonne Doughty, DO [facility], [facility]

## 2022-04-18 DIAGNOSIS — M109 Gout, unspecified: Secondary | ICD-10-CM | POA: Diagnosis not present

## 2022-04-18 DIAGNOSIS — I129 Hypertensive chronic kidney disease with stage 1 through stage 4 chronic kidney disease, or unspecified chronic kidney disease: Secondary | ICD-10-CM | POA: Diagnosis not present

## 2022-04-18 DIAGNOSIS — N184 Chronic kidney disease, stage 4 (severe): Secondary | ICD-10-CM | POA: Diagnosis not present

## 2022-04-18 DIAGNOSIS — E1122 Type 2 diabetes mellitus with diabetic chronic kidney disease: Secondary | ICD-10-CM | POA: Diagnosis not present

## 2022-04-18 DIAGNOSIS — I10 Essential (primary) hypertension: Secondary | ICD-10-CM | POA: Diagnosis not present

## 2022-04-18 DIAGNOSIS — N179 Acute kidney failure, unspecified: Secondary | ICD-10-CM | POA: Diagnosis not present

## 2022-04-18 DIAGNOSIS — R809 Proteinuria, unspecified: Secondary | ICD-10-CM | POA: Diagnosis not present

## 2022-04-18 DIAGNOSIS — N2581 Secondary hyperparathyroidism of renal origin: Secondary | ICD-10-CM | POA: Diagnosis not present

## 2022-04-18 DIAGNOSIS — R600 Localized edema: Secondary | ICD-10-CM | POA: Diagnosis not present

## 2022-04-18 DIAGNOSIS — E875 Hyperkalemia: Secondary | ICD-10-CM | POA: Diagnosis not present

## 2022-04-22 ENCOUNTER — Telehealth: Payer: Self-pay | Admitting: Internal Medicine

## 2022-04-22 DIAGNOSIS — I4819 Other persistent atrial fibrillation: Secondary | ICD-10-CM

## 2022-04-22 MED ORDER — APIXABAN 5 MG PO TABS: 5.0000 mg | ORAL_TABLET | Freq: Two times a day (BID) | ORAL | 1 refills | Status: DC

## 2022-04-22 NOTE — Telephone Encounter (Signed)
Prescription refill request for Eliquis received. Indication:  Afib  Last office visit: 04/04/22 Caryl Comes)  Scr: 1.80 (04/18/22) Age: 71 Weight: 66.9kg  Appropriate dose and refill sent to requested pharmacy.

## 2022-04-22 NOTE — Telephone Encounter (Signed)
Refill Request.  

## 2022-04-22 NOTE — Telephone Encounter (Signed)
*  STAT* If patient is at the pharmacy, call can be transferred to refill team.   1. Which medications need to be refilled? (please list name of each medication and dose if known) new prescription- changing pharmacy for Eliquisi  2. Which pharmacy/location (including street and city if local pharmacy) is medication to be sent to?[facility] [place]  3. Do they need a 30 day or 90 day supply? 90 das # 180 and refills

## 2022-04-24 DIAGNOSIS — E1122 Type 2 diabetes mellitus with diabetic chronic kidney disease: Secondary | ICD-10-CM | POA: Diagnosis not present

## 2022-04-24 DIAGNOSIS — G8929 Other chronic pain: Secondary | ICD-10-CM | POA: Diagnosis not present

## 2022-04-24 DIAGNOSIS — N189 Chronic kidney disease, unspecified: Secondary | ICD-10-CM | POA: Diagnosis not present

## 2022-04-24 DIAGNOSIS — M542 Cervicalgia: Secondary | ICD-10-CM | POA: Diagnosis not present

## 2022-04-24 DIAGNOSIS — R809 Proteinuria, unspecified: Secondary | ICD-10-CM | POA: Diagnosis not present

## 2022-04-24 DIAGNOSIS — M7541 Impingement syndrome of right shoulder: Secondary | ICD-10-CM | POA: Diagnosis not present

## 2022-04-24 DIAGNOSIS — N184 Chronic kidney disease, stage 4 (severe): Secondary | ICD-10-CM | POA: Diagnosis not present

## 2022-04-24 NOTE — Addendum Note (Signed)
Addended by: Douglass Rivers D on: 04/24/2022 10:26 AM   Modules accepted: Level of Service

## 2022-04-24 NOTE — Progress Notes (Signed)
Remote pacemaker transmission.   

## 2022-04-30 ENCOUNTER — Encounter: Payer: Self-pay | Admitting: Internal Medicine

## 2022-04-30 ENCOUNTER — Telehealth: Payer: Self-pay

## 2022-04-30 ENCOUNTER — Ambulatory Visit
Admission: RE | Admit: 2022-04-30 | Discharge: 2022-04-30 | Disposition: A | Discharge status: Home / Self Care | Payer: Medicare Other | Attending: Internal Medicine | Admitting: Internal Medicine

## 2022-04-30 ENCOUNTER — Other Ambulatory Visit: Payer: Self-pay

## 2022-04-30 ENCOUNTER — Encounter
Admission: RE | Disposition: A | Discharge status: Home / Self Care | Payer: Self-pay | Source: Home / Self Care | Attending: Internal Medicine

## 2022-04-30 DIAGNOSIS — R112 Nausea with vomiting, unspecified: Secondary | ICD-10-CM | POA: Diagnosis not present

## 2022-04-30 DIAGNOSIS — I4819 Other persistent atrial fibrillation: Secondary | ICD-10-CM | POA: Insufficient documentation

## 2022-04-30 DIAGNOSIS — I5032 Chronic diastolic (congestive) heart failure: Secondary | ICD-10-CM | POA: Insufficient documentation

## 2022-04-30 DIAGNOSIS — I442 Atrioventricular block, complete: Secondary | ICD-10-CM | POA: Diagnosis not present

## 2022-04-30 DIAGNOSIS — R400 Somnolence: Secondary | ICD-10-CM | POA: Diagnosis not present

## 2022-04-30 DIAGNOSIS — Z7901 Long term (current) use of anticoagulants: Secondary | ICD-10-CM | POA: Diagnosis not present

## 2022-04-30 DIAGNOSIS — N289 Disorder of kidney and ureter, unspecified: Secondary | ICD-10-CM | POA: Diagnosis not present

## 2022-04-30 DIAGNOSIS — I110 Hypertensive heart disease with heart failure: Secondary | ICD-10-CM | POA: Insufficient documentation

## 2022-04-30 DIAGNOSIS — I4892 Unspecified atrial flutter: Secondary | ICD-10-CM | POA: Insufficient documentation

## 2022-04-30 DIAGNOSIS — Z45010 Encounter for checking and testing of cardiac pacemaker pulse generator [battery]: Secondary | ICD-10-CM

## 2022-04-30 DIAGNOSIS — I472 Ventricular tachycardia, unspecified: Secondary | ICD-10-CM | POA: Insufficient documentation

## 2022-04-30 DIAGNOSIS — I951 Orthostatic hypotension: Secondary | ICD-10-CM | POA: Insufficient documentation

## 2022-04-30 HISTORY — PX: PPM GENERATOR CHANGEOUT: EP1233

## 2022-04-30 LAB — GLUCOSE, CAPILLARY
Glucose-Capillary: 77 mg/dL (ref 70–99)
Glucose-Capillary: 109 mg/dL — ABNORMAL HIGH (ref 70–99)

## 2022-04-30 SURGERY — PPM GENERATOR CHANGEOUT
Anesthesia: Moderate Sedation

## 2022-04-30 MED ORDER — CEFAZOLIN SODIUM-DEXTROSE 2-4 GM/100ML-% IV SOLN: 2.0000 g | INTRAVENOUS | Status: AC

## 2022-04-30 MED ORDER — FENTANYL CITRATE (PF) 100 MCG/2ML IJ SOLN
INTRAMUSCULAR | Status: AC
  Filled 2022-04-30: qty 2

## 2022-04-30 MED ORDER — MIDAZOLAM HCL 2 MG/2ML IJ SOLN
INTRAMUSCULAR | Status: AC
  Filled 2022-04-30: qty 2

## 2022-04-30 MED ORDER — LIDOCAINE HCL 1 % IJ SOLN
INTRAMUSCULAR | Status: AC
  Filled 2022-04-30: qty 40

## 2022-04-30 MED ORDER — MIDAZOLAM HCL 2 MG/2ML IJ SOLN
INTRAMUSCULAR | Status: DC | PRN
  Administered 2022-04-30: 1 mg via INTRAVENOUS
  Administered 2022-04-30: 2 mg via INTRAVENOUS

## 2022-04-30 MED ORDER — CEFAZOLIN SODIUM-DEXTROSE 2-4 GM/100ML-% IV SOLN
INTRAVENOUS | Status: AC
  Administered 2022-04-30: 2 g via INTRAVENOUS
  Filled 2022-04-30: qty 100

## 2022-04-30 MED ORDER — SODIUM CHLORIDE 0.9 % IV SOLN: INTRAVENOUS | Status: DC

## 2022-04-30 MED ORDER — ACETAMINOPHEN 325 MG PO TABS: 325.0000 mg | ORAL_TABLET | ORAL | Status: DC | PRN

## 2022-04-30 MED ORDER — FENTANYL CITRATE (PF) 100 MCG/2ML IJ SOLN
INTRAMUSCULAR | Status: DC | PRN
  Administered 2022-04-30: 25 ug via INTRAVENOUS

## 2022-04-30 MED ORDER — LIDOCAINE HCL (PF) 1 % IJ SOLN
INTRAMUSCULAR | Status: DC | PRN
  Administered 2022-04-30: 40 mL

## 2022-04-30 MED ORDER — CHLORHEXIDINE GLUCONATE CLOTH 2 % EX PADS
6.0000 | MEDICATED_PAD | Freq: Every day | CUTANEOUS | Status: DC
  Administered 2022-04-30: 6 via TOPICAL

## 2022-04-30 MED ORDER — SODIUM CHLORIDE 0.9 % IV SOLN
80.0000 mg | INTRAVENOUS | Status: AC
  Administered 2022-04-30: 80 mg
  Filled 2022-04-30: qty 2

## 2022-04-30 MED ORDER — CHLORHEXIDINE GLUCONATE 4 % EX LIQD: 4.0000 | Freq: Once | CUTANEOUS | Status: DC

## 2022-04-30 SURGICAL SUPPLY — 18 items
COVER SURGICAL LIGHT HANDLE (MISCELLANEOUS) IMPLANT
DERMABOND ADVANCED .7 DNX12 (GAUZE/BANDAGES/DRESSINGS) IMPLANT
DEVICE DSSCT PLSMBLD 3.0S LGHT (MISCELLANEOUS) IMPLANT
ELECT REM PT RETURN 9FT ADLT (ELECTROSURGICAL) ×1
ELECTRODE REM PT RTRN 9FT ADLT (ELECTROSURGICAL) IMPLANT
HEMOSTAT SURGICEL 2X4 FIBR (HEMOSTASIS) IMPLANT
IPG PACE AZUR XT DR MRI W1DR01 (Pacemaker) IMPLANT
PACE AZURE XT DR MRI W1DR01 (Pacemaker) ×1 IMPLANT
PAD ELECT DEFIB RADIOL ZOLL (MISCELLANEOUS) IMPLANT
PLASMABLADE 3.0S W/LIGHT (MISCELLANEOUS) ×1
POUCH AIGIS-R ANTIBACT PPM (Mesh General) ×1 IMPLANT
POUCH AIGIS-R ANTIBACT PPM MED (Mesh General) IMPLANT
SUT SILK 0 FSL (SUTURE) IMPLANT
SUT VIC AB 2-0 CT1 27 (SUTURE) ×1
SUT VIC AB 2-0 CT1 TAPERPNT 27 (SUTURE) IMPLANT
SUT VIC AB 3-0 SH 27 (SUTURE) ×1
SUT VIC AB 3-0 SH 27X BRD (SUTURE) IMPLANT
TRAY PACEMAKER INSERTION (PACKS) ×1 IMPLANT

## 2022-04-30 NOTE — Interval H&P Note (Signed)
History and Physical Interval Note:  04/30/2022 9:07 AM  Renee Boyd  has presented today for surgery, with the diagnosis of PPM Generator Changeout  Dual Lead   Pacemaker generator end of life [employer].  The various methods of treatment have been discussed with the patient and family. After consideration of risks, benefits and other options for treatment, the patient has consented to  Procedure(s): PPM GENERATOR CHANGEOUT (N/A) as a surgical intervention.  The patient's history has been reviewed, patient examined, no change in status, stable for surgery.  I have reviewed the patient's chart and labs.  Questions were answered to the patient's satisfaction.     Virl Axe For device generator replacement

## 2022-04-30 NOTE — Discharge Instructions (Addendum)
After Your Pacemaker   You have a After Your Pacemaker   You have a [employer] Pacemaker  ACTIVITY  No driving for 4 days   INCISION/Dressing Resume Eliquis Thursday evening 05/02/2022  Dermabond should come off completely in 10 to 14 days, if not will remove at wound check visit  Monitor your Pacemaker site for redness, swelling, and drainage. Call the device clinic at [phone] if you experience these symptoms or fever/chills.  If your incision is closed with Dermabond/Surgical glue. You may shower 1 day after your pacemaker implant and wash around the site with soap and water.    Avoid lotions, ointments, or perfumes over your incision until it is well-healed.  You may use a hot tub or a pool AFTER your wound check appointment if the incision is completely closed.  PAcemaker Alerts:  Some alerts are vibratory and others beep. These are NOT emergencies. Please call our office to let Renee Boyd know. If this occurs at night or on weekends, it can wait until the next business day. Send a remote transmission.  If your device is capable of reading fluid status (for heart failure), you will be offered monthly monitoring to review this with you.   DEVICE MANAGEMENT Remote monitoring is used to monitor your pacemaker from home. This monitoring is scheduled every 91 days by our office. It allows Renee Boyd to keep an eye on the functioning of your device to ensure it is working properly. You will routinely see your Electrophysiologist annually (more often if necessary).   You should receive your ID card for your new device in 4-8 weeks. Keep this card with you at all times once received. Consider wearing a medical alert bracelet or necklace.  Your Pacemaker may be MRI compatible. This will be discussed at your next office visit/wound check.  You should avoid contact with strong electric or magnetic fields.   Do not use amateur (ham) radio equipment or electric (arc) welding torches. MP3 player  headphones with magnets should not be used. Some devices are safe to use if held at least 12 inches (30 cm) from your Pacemaker. These include power tools, lawn mowers, and speakers. If you are unsure if something is safe to use, ask your health care provider.  When using your cell phone, hold it to the ear that is on the opposite side from the Pacemaker. Do not leave your cell phone in a pocket over the Pacemaker.  You may safely use electric blankets, heating pads, computers, and microwave ovens.  Call the office right away if: You have chest pain. You feel more short of breath than you have felt before. You feel more light-headed than you have felt before. Your incision starts to open up.  This information is not intended to replace advice given to you by your health care provider. Make sure you discuss any questions you have with your health care provider.  Pacemaker  ACTIVITY Do not lift your arm above shoulder height for 1 week after your procedure. After 7 days, you may progress as below.  You should remove your sling 24 hours after your procedure, unless otherwise instructed by your provider.     Tuesday May 07, 2022  Wednesday May 08, 2022 Thursday May 09, 2022 Friday May 10, 2022   Do not lift, push, pull, or carry anything over 10 pounds with the affected arm until 6 weeks (Tuesday June 11, 2022 ) after your procedure.   You may drive AFTER your wound check, unless you  have been told otherwise by your provider.   Ask your healthcare provider when you can go back to work   INCISION/Dressing Resume Eliquis on Thursday evening 05/02/2022  Monitor your Pacemaker site for redness, swelling, and drainage. Call the device clinic at [phone] if you experience these symptoms or fever/chills.  If your incision is closed with Dermabond/Surgical glue. You may shower 1 day after your pacemaker implant and wash around the site with soap and water.    Avoid lotions,  ointments, or perfumes over your incision until it is well-healed.  You may use a hot tub or a pool AFTER your wound check appointment if the incision is completely closed.  Pacemaker Alerts:  Some alerts are vibratory and others beep. These are NOT emergencies. Please call our office to let Renee Boyd know. If this occurs at night or on weekends, it can wait until the next business day. Send a remote transmission.  If your device is capable of reading fluid status (for heart failure), you will be offered monthly monitoring to review this with you.   DEVICE MANAGEMENT Remote monitoring is used to monitor your pacemaker from home. This monitoring is scheduled every 91 days by our office. It allows Renee Boyd to keep an eye on the functioning of your device to ensure it is working properly. You will routinely see your Electrophysiologist annually (more often if necessary).   You should receive your ID card for your new device in 4-8 weeks. Keep this card with you at all times once received. Consider wearing a medical alert bracelet or necklace.  Your Pacemaker may be MRI compatible. This will be discussed at your next office visit/wound check.  You should avoid contact with strong electric or magnetic fields.   Do not use amateur (ham) radio equipment or electric (arc) welding torches. MP3 player headphones with magnets should not be used. Some devices are safe to use if held at least 12 inches (30 cm) from your Pacemaker. These include power tools, lawn mowers, and speakers. If you are unsure if something is safe to use, ask your health care provider.  When using your cell phone, hold it to the ear that is on the opposite side from the Pacemaker. Do not leave your cell phone in a pocket over the Pacemaker.  You may safely use electric blankets, heating pads, computers, and microwave ovens.  Call the office right away if: You have chest pain. You feel more short of breath than you have felt before. You feel  more light-headed than you have felt before. Your incision starts to open up.  This information is not intended to replace advice given to you by your health care provider. Make sure you discuss any questions you have with your health care provider.

## 2022-04-30 NOTE — Telephone Encounter (Signed)
Pt monitor ordered. She should receive it in 7-10 business days.

## 2022-05-01 ENCOUNTER — Encounter: Payer: Self-pay | Admitting: Internal Medicine

## 2022-05-02 DIAGNOSIS — Z45010 Encounter for checking and testing of cardiac pacemaker pulse generator [battery]: Secondary | ICD-10-CM

## 2022-05-08 ENCOUNTER — Other Ambulatory Visit: Payer: Self-pay | Admitting: Family Medicine

## 2022-05-08 DIAGNOSIS — F5101 Primary insomnia: Secondary | ICD-10-CM

## 2022-05-08 DIAGNOSIS — M159 Polyosteoarthritis, unspecified: Secondary | ICD-10-CM

## 2022-05-08 DIAGNOSIS — G8929 Other chronic pain: Secondary | ICD-10-CM

## 2022-05-08 NOTE — Telephone Encounter (Signed)
Requested medication (s) are due for refill today: yes  Requested medication (s) are on the active medication list: yes  Last refill:  03/12/22 #30/2  Future visit scheduled: yes  Notes to clinic:  Unable to refill per protocol, cannot delegate.     Requested Prescriptions  Pending Prescriptions Disp Refills   temazepam (RESTORIL) 30 MG capsule [Pharmacy Med Name: TEMAZEPAM 30 MG CAP] 30 capsule     Sig: TAKE 1 CAPSULE BY MOUTH AT BEDTIME AS NEEDED SLEEP     Not Delegated - Psychiatry: Anxiolytics/Hypnotics 2 Failed - 05/08/2022  1:47 PM      Failed - This refill cannot be delegated      Failed - Urine Drug Screen completed in last 360 days      Passed - Patient is not pregnant      Passed - Valid encounter within last 6 months    Recent Outpatient Visits           1 month ago Chronic right shoulder pain   [facility] [place], Devonne Doughty, DO   4 months ago Type 2 diabetes mellitus with hyperlipidemia [facility])   [facility], Devonne Doughty, DO   10 months ago Benign hypertension with CKD (chronic kidney disease) stage IV [facility])   [facility] Olin Hauser, DO   1 year ago Benign hypertension with CKD (chronic kidney disease) stage IV [facility])   [place], DO   1 year ago Benign hypertension with CKD (chronic kidney disease) stage IV [facility])   [facility] [place], Devonne Doughty, DO       Future Appointments             In 1 month [place], Devonne Doughty, DO [facility], [facility]

## 2022-05-08 NOTE — Telephone Encounter (Signed)
Requested medication (s) are due for refill today: no  Requested medication (s) are on the active medication list: no  Last refill:  not on current med list  Future visit scheduled: 06/26/22, seen 03/19/22  Notes to clinic:  Not on current med list and not delegated.      Requested Prescriptions  Pending Prescriptions Disp Refills   traMADol (ULTRAM) 50 MG tablet [Pharmacy Med Name: TRAMADOL HCL 50 MG TAB] 60 tablet     Sig: TAKE 1 TABLET BY MOUTH TWICE DAILY     Not Delegated - Analgesics:  Opioid Agonists Failed - 05/08/2022  1:39 PM      Failed - This refill cannot be delegated      Failed - Urine Drug Screen completed in last 360 days      Passed - Valid encounter within last 3 months    Recent Outpatient Visits           1 month ago Chronic right shoulder pain   [place], Devonne Doughty, DO   4 months ago Type 2 diabetes mellitus with hyperlipidemia [facility])   [facility], Devonne Doughty, DO   10 months ago Benign hypertension with CKD (chronic kidney disease) stage IV [facility])   [facility] Olin Hauser, DO   1 year ago Benign hypertension with CKD (chronic kidney disease) stage IV [facility])   [place], DO   1 year ago Benign hypertension with CKD (chronic kidney disease) stage IV [facility])   [facility] [place], Devonne Doughty, DO       Future Appointments             In 1 month [place], Devonne Doughty, DO [facility], [facility]

## 2022-05-09 ENCOUNTER — Other Ambulatory Visit: Payer: Self-pay

## 2022-05-09 DIAGNOSIS — K219 Gastro-esophageal reflux disease without esophagitis: Secondary | ICD-10-CM

## 2022-05-09 MED ORDER — OMEPRAZOLE 20 MG PO CPDR: 20.0000 mg | DELAYED_RELEASE_CAPSULE | Freq: Two times a day (BID) | ORAL | 2 refills | Status: DC

## 2022-05-10 DIAGNOSIS — H40003 Preglaucoma, unspecified, bilateral: Secondary | ICD-10-CM | POA: Diagnosis not present

## 2022-05-10 LAB — HM DIABETES EYE EXAM

## 2022-05-15 ENCOUNTER — Ambulatory Visit: Payer: Medicare Other | Attending: Cardiology

## 2022-05-15 DIAGNOSIS — I442 Atrioventricular block, complete: Secondary | ICD-10-CM

## 2022-05-15 LAB — CUP PACEART INCLINIC DEVICE CHECK
Battery Remaining Longevity: 138 mo
Battery Voltage: 3.2 V
Brady Statistic AP VP Percent: 96.18 %
Brady Statistic AP VS Percent: 0.08 %
Brady Statistic AS VP Percent: 3.65 %
Brady Statistic AS VS Percent: 0.09 %
Brady Statistic RA Percent Paced: 95.89 %
Brady Statistic RV Percent Paced: 99.82 %
Date Time Interrogation Session: 20231011140400
Implantable Lead Implant Date: 20141217
Implantable Lead Implant Date: 20141217
Implantable Lead Location: 753859
Implantable Lead Location: 753860
Implantable Lead Model: 5076
Implantable Lead Model: 5076
Implantable Pulse Generator Implant Date: 20230926
Lead Channel Impedance Value: 361 Ohm
Lead Channel Impedance Value: 418 Ohm
Lead Channel Impedance Value: 437 Ohm
Lead Channel Impedance Value: 513 Ohm
Lead Channel Pacing Threshold Amplitude: 0.375 V
Lead Channel Pacing Threshold Amplitude: 0.75 V
Lead Channel Pacing Threshold Pulse Width: 0.4 ms
Lead Channel Pacing Threshold Pulse Width: 0.4 ms
Lead Channel Sensing Intrinsic Amplitude: 10.25 mV
Lead Channel Sensing Intrinsic Amplitude: 10.25 mV
Lead Channel Sensing Intrinsic Amplitude: 5.125 mV
Lead Channel Sensing Intrinsic Amplitude: 5.125 mV
Lead Channel Setting Pacing Amplitude: 1.75 V
Lead Channel Setting Pacing Amplitude: 2 V
Lead Channel Setting Pacing Pulse Width: 0.4 ms
Lead Channel Setting Sensing Sensitivity: 2 mV

## 2022-05-15 NOTE — Progress Notes (Signed)
Wound check appointment. Dermabond removed prior to OV. Small amount of edema and redness noted to incision area. No drainage noted. Patient reports itching from glue and put witch-hazel and hydrocortisone on incision. Dr. Caryl Comes in to assess and verbally advised patient not to use anything on incision other than wash to warm/soapy water daily. Incision edges approximated, wound well healed. Normal device function. Thresholds, sensing, and impedances consistent with implant measurements. Device programmed at chronic outputs, no new leads implanted. Histogram distribution appropriate for patient and level of activity. AT/AF burden 0.4%, EGM's appear AT. 1 NSVT event logged, <20 beats in duration. Patient educated about wound care, arm mobility. ROV in 3 months with implanting physician.

## 2022-05-15 NOTE — Patient Instructions (Addendum)
   After Your Pacemaker   Monitor your pacemaker site for redness, swelling, and drainage. Call the device clinic at [phone] if you experience these symptoms or fever/chills.  Your incision was closed with Dermabond:  You may shower 1 day after your defibrillator implant and wash your incision with soap and water. Avoid lotions, ointments, or perfumes over your incision until it is well-healed.  You may use a hot tub or a pool after your wound check appointment if the incision is completely closed.  You may drive, unless driving has been restricted by your healthcare providers.  Remote monitoring is used to monitor your pacemaker from home. This monitoring is scheduled every 91 days by our office. It allows us to keep an eye on the functioning of your device to ensure it is working properly. You will routinely see your Electrophysiologist annually (more often if necessary).  

## 2022-05-28 DIAGNOSIS — E1159 Type 2 diabetes mellitus with other circulatory complications: Secondary | ICD-10-CM | POA: Diagnosis not present

## 2022-06-04 DIAGNOSIS — N184 Chronic kidney disease, stage 4 (severe): Secondary | ICD-10-CM | POA: Diagnosis not present

## 2022-06-04 DIAGNOSIS — E1159 Type 2 diabetes mellitus with other circulatory complications: Secondary | ICD-10-CM | POA: Diagnosis not present

## 2022-06-04 DIAGNOSIS — E1122 Type 2 diabetes mellitus with diabetic chronic kidney disease: Secondary | ICD-10-CM | POA: Diagnosis not present

## 2022-06-18 ENCOUNTER — Other Ambulatory Visit: Payer: Self-pay

## 2022-06-18 DIAGNOSIS — E1169 Type 2 diabetes mellitus with other specified complication: Secondary | ICD-10-CM

## 2022-06-18 DIAGNOSIS — I129 Hypertensive chronic kidney disease with stage 1 through stage 4 chronic kidney disease, or unspecified chronic kidney disease: Secondary | ICD-10-CM

## 2022-06-18 DIAGNOSIS — E039 Hypothyroidism, unspecified: Secondary | ICD-10-CM

## 2022-06-18 DIAGNOSIS — N184 Chronic kidney disease, stage 4 (severe): Secondary | ICD-10-CM

## 2022-06-18 DIAGNOSIS — I4819 Other persistent atrial fibrillation: Secondary | ICD-10-CM

## 2022-06-18 DIAGNOSIS — E782 Mixed hyperlipidemia: Secondary | ICD-10-CM

## 2022-06-19 ENCOUNTER — Other Ambulatory Visit: Payer: Medicare Other

## 2022-06-19 DIAGNOSIS — E039 Hypothyroidism, unspecified: Secondary | ICD-10-CM | POA: Diagnosis not present

## 2022-06-19 DIAGNOSIS — I129 Hypertensive chronic kidney disease with stage 1 through stage 4 chronic kidney disease, or unspecified chronic kidney disease: Secondary | ICD-10-CM | POA: Diagnosis not present

## 2022-06-19 DIAGNOSIS — E785 Hyperlipidemia, unspecified: Secondary | ICD-10-CM | POA: Diagnosis not present

## 2022-06-19 DIAGNOSIS — E782 Mixed hyperlipidemia: Secondary | ICD-10-CM | POA: Diagnosis not present

## 2022-06-19 DIAGNOSIS — E1169 Type 2 diabetes mellitus with other specified complication: Secondary | ICD-10-CM | POA: Diagnosis not present

## 2022-06-19 DIAGNOSIS — N184 Chronic kidney disease, stage 4 (severe): Secondary | ICD-10-CM | POA: Diagnosis not present

## 2022-06-19 DIAGNOSIS — I4819 Other persistent atrial fibrillation: Secondary | ICD-10-CM | POA: Diagnosis not present

## 2022-06-20 LAB — CBC WITH DIFFERENTIAL/PLATELET
Absolute Monocytes: 271 cells/uL (ref 200–950)
Basophils Absolute: 29 cells/uL (ref 0–200)
Basophils Relative: 0.7 %
Eosinophils Absolute: 328 cells/uL (ref 15–500)
Eosinophils Relative: 8 %
HCT: 41.8 % (ref 35.0–45.0)
Hemoglobin: 14.1 g/dL (ref 11.7–15.5)
Lymphs Abs: 1292 cells/uL (ref 850–3900)
MCH: 29.9 pg (ref 27.0–33.0)
MCHC: 33.7 g/dL (ref 32.0–36.0)
MCV: 88.6 fL (ref 80.0–100.0)
MPV: 12.1 fL (ref 7.5–12.5)
Monocytes Relative: 6.6 %
Neutro Abs: 2181 cells/uL (ref 1500–7800)
Neutrophils Relative %: 53.2 %
Platelets: 180 10*3/uL (ref 140–400)
RBC: 4.72 10*6/uL (ref 3.80–5.10)
RDW: 14.2 % (ref 11.0–15.0)
Total Lymphocyte: 31.5 %
WBC: 4.1 10*3/uL (ref 3.8–10.8)

## 2022-06-20 LAB — LIPID PANEL
Cholesterol: 131 mg/dL (ref <200)
HDL: 61 mg/dL (ref >=50)
LDL Cholesterol (Calc): 49 mg/dL (calc)
Non-HDL Cholesterol (Calc): 70 mg/dL (calc) (ref <130)
Total CHOL/HDL Ratio: 2.1 (calc) (ref <5.0)
Triglycerides: 133 mg/dL (ref <150)

## 2022-06-20 LAB — COMPLETE METABOLIC PANEL WITH GFR
AG Ratio: 1.8 (calc) (ref 1.0–2.5)
ALT: 21 U/L (ref 6–29)
AST: 24 U/L (ref 10–35)
Albumin: 4.5 g/dL (ref 3.6–5.1)
Alkaline phosphatase (APISO): 71 U/L (ref 37–153)
BUN/Creatinine Ratio: 14 (calc) (ref 6–22)
BUN: 22 mg/dL (ref 7–25)
CO2: 29 mmol/L (ref 20–32)
Calcium: 10.4 mg/dL (ref 8.6–10.4)
Chloride: 102 mmol/L (ref 98–110)
Creat: 1.59 mg/dL — ABNORMAL HIGH (ref 0.60–1.00)
Globulin: 2.5 g/dL (calc) (ref 1.9–3.7)
Glucose, Bld: 122 mg/dL — ABNORMAL HIGH (ref 65–99)
Potassium: 3.8 mmol/L (ref 3.5–5.3)
Sodium: 142 mmol/L (ref 135–146)
Total Bilirubin: 0.4 mg/dL (ref 0.2–1.2)
Total Protein: 7 g/dL (ref 6.1–8.1)
eGFR: 34 mL/min/{1.73_m2} — ABNORMAL LOW (ref >=60)

## 2022-06-20 LAB — HEMOGLOBIN A1C
Hgb A1c MFr Bld: 6.4 % of total Hgb — ABNORMAL HIGH (ref <5.7)
Mean Plasma Glucose: 137 mg/dL
eAG (mmol/L): 7.6 mmol/L

## 2022-06-20 LAB — TSH: TSH: 0.59 mIU/L (ref 0.40–4.50)

## 2022-06-20 LAB — T4, FREE: Free T4: 1.3 ng/dL (ref 0.8–1.8)

## 2022-06-26 ENCOUNTER — Encounter: Payer: Self-pay | Admitting: Family Medicine

## 2022-06-26 ENCOUNTER — Ambulatory Visit (INDEPENDENT_AMBULATORY_CARE_PROVIDER_SITE_OTHER): Payer: Medicare Other | Admitting: Family Medicine

## 2022-06-26 VITALS — BP 148/62 | HR 79 | Ht 59.0 in | Wt 150.6 lb

## 2022-06-26 DIAGNOSIS — E039 Hypothyroidism, unspecified: Secondary | ICD-10-CM

## 2022-06-26 DIAGNOSIS — M7552 Bursitis of left shoulder: Secondary | ICD-10-CM | POA: Diagnosis not present

## 2022-06-26 DIAGNOSIS — Z87891 Personal history of nicotine dependence: Secondary | ICD-10-CM

## 2022-06-26 DIAGNOSIS — E782 Mixed hyperlipidemia: Secondary | ICD-10-CM

## 2022-06-26 DIAGNOSIS — M25511 Pain in right shoulder: Secondary | ICD-10-CM | POA: Diagnosis not present

## 2022-06-26 DIAGNOSIS — Z7984 Long term (current) use of oral hypoglycemic drugs: Secondary | ICD-10-CM | POA: Diagnosis not present

## 2022-06-26 DIAGNOSIS — I129 Hypertensive chronic kidney disease with stage 1 through stage 4 chronic kidney disease, or unspecified chronic kidney disease: Secondary | ICD-10-CM | POA: Diagnosis not present

## 2022-06-26 DIAGNOSIS — E1122 Type 2 diabetes mellitus with diabetic chronic kidney disease: Secondary | ICD-10-CM

## 2022-06-26 DIAGNOSIS — N184 Chronic kidney disease, stage 4 (severe): Secondary | ICD-10-CM

## 2022-06-26 DIAGNOSIS — E1169 Type 2 diabetes mellitus with other specified complication: Secondary | ICD-10-CM

## 2022-06-26 DIAGNOSIS — G8929 Other chronic pain: Secondary | ICD-10-CM | POA: Diagnosis not present

## 2022-06-26 MED ORDER — LIDOCAINE HCL (PF) 1 % IJ SOLN
4.0000 mL | Freq: Once | INTRAMUSCULAR | Status: AC
  Administered 2022-06-26: 4 mL

## 2022-06-26 MED ORDER — METHYLPREDNISOLONE ACETATE 40 MG/ML IJ SUSP
40.0000 mg | Freq: Once | INTRAMUSCULAR | Status: AC
  Administered 2022-06-26: 40 mg via INTRA_ARTICULAR

## 2022-06-26 NOTE — Progress Notes (Signed)
Subjective:    Patient ID: Renee Boyd, female    DOB: [birth year], 71 y.o.   MRN: [id number]  Renee Boyd is a 71 y.o. female presenting on 06/26/2022 for Medicare Wellness   HPI  Chronic Right Shoulder and Neck Pain Osteoarthritis, multiple joints Back Pain / Hip Pain / Shoulder pain Chronic problem. Episodic painful flares of joint pain, worse with inc activity and storms and weather changes Previously managed on Tramadol PRN for pain and this was renally adjusted to max of 53m BID - still has if needs, may request re order in future - she reports it has been ineffective now for shoulder pain  Cannot take NSAIDs due to CKD. On Gabapentin 30238mdaily doing well. Using baclofen 38m36mnly max dose nightly She wakes up with pain worse at night R shoulder, has flares difficulty raising arm.  [facility] did shoulder injection improved but she was using shoulder a lot more and worsened it again   Specialists: Endocrinology - HilMalissa Hippo [facility] Nephrology - Dr [facility]avonia [facility] - CVD [place] / EP       CKD-IV with hyperkalemia and subnephrotic proteinuria HTN Secondary Anemia of chronic kidney disease  Secondary to DM HTN Followed by Dr KolJuleen Chinaproved Kidney function Cr 1.3 range now on last lab   On Furosemide, Amlodipine Off sodium bicarbonate   Type 2 Diabetes [facility] due to advancing renal disease. Remains discontinued on Metformin due to renal function. She should avoid NSAIDs. But continue Lisinopril ACEi. Last A1c prior 6.8, now down to 6.3 She uses Contour Next EZ Glucomter On Trulicity 3 mg weekly (inc by Endocrine) On Jardiance 47m37mper Nephrology Off Metformin due to CKD Off Rybelsus due to nausea   Osteoarthritis, multiple joints / Back Pain / Hip Pain / Shoulder pain Chronic problem. Episodic painful flares of  joint pain, worse with inc activity and storms and weather changes Previously managed on Tramadol PRN for pain and this was renally adjusted to max of 50mg438m - still has if needs, may request re order in future  Cannot take NSAIDs due to CKD. On Gabapentin 300mg 70my doing well. Using baclofen 38mg on57mmax dose nightly   Persistent Atrial Fibrillation, s/p Cardioversion On Anticoagulation with Eliquis Complete Heart Block Precordial Chest Pain Followed by Dr Steven Virl Axed Cardioversion in March 2022, unsuccessful.   Chronic Insomnia Primary problem difficulty sleeping. She takes Temazepam nightly PRN. Will need refill in future       06/26/2022   11:41 AM 03/25/2022    2:10 PM 03/20/2021   11:21 AM  Depression screen PHQ 2/9  Decreased Interest 0 0 0  Down, Depressed, Hopeless 1 1 0  PHQ - 2 Score 1 1 0  Altered sleeping 0    Tired, decreased energy 0    Change in appetite 0    Feeling bad or failure about yourself  0    Trouble concentrating 0    Moving slowly or fidgety/restless 0    Suicidal thoughts 0    PHQ-9 Score 1    Difficult doing work/chores Not difficult at all      Past Medical History:  Diagnosis Date   Complete heart block (HCC)  [facility] s/p MDT PPM 2006 with device generator replacement 2014; b. followed by Dr. Klein  Caryl Comesetes (HCC)  [facility]    Hypertension    Hypothyroid    Pacemaker    Persistent atrial fibrillation (HCC)    a. on Eliquis; b. CHADS2VASc 6 (HTN. age x 1, DM, TIA x 2, female); c. s/p DCCV 05/28/18   Pulmonary hypertension ([place])    a. echo 2017: EF of 60-65%, no RWMA, mildly dilated left atrium, RVSF normal, PASP 64 mmHg   TIA (transient ischemic attack)    Past Surgical History:  Procedure Laterality Date   ABDOMINAL HYSTERECTOMY     CARDIOVERSION N/A 05/28/2018   Procedure: CARDIOVERSION;  Surgeon: Minna Merritts, MD;  Location: [facility] ORS;  Service: Cardiovascular;  Laterality: N/A;    CARDIOVERSION N/A 03/01/2020   Procedure: CARDIOVERSION;  Surgeon: Kate Sable, MD;  Location: [facility];  Service: Cardiovascular;  Laterality: N/A;   CARDIOVERSION N/A 10/25/2020   Procedure: CARDIOVERSION;  Surgeon: Kate Sable, MD;  Location: [place] ORS;  Service: Cardiovascular;  Laterality: N/A;   CHOLECYSTECTOMY     PPM GENERATOR CHANGEOUT N/A 04/30/2022   Procedure: PPM GENERATOR CHANGEOUT;  Surgeon: Deboraha Sprang, MD;  Location: [place] [facility];  Service: Cardiovascular;  Laterality: N/A;   TONSILLECTOMY     TUBAL LIGATION     Social History   Socioeconomic History   Marital status: Divorced    Spouse name: Not on file   Number of children: 3   Years of education: Not on file   Highest education level: High school graduate  Occupational History   Occupation: retired  Tobacco Use   Smoking status: Former    Packs/day: 2.00    Years: 40.00    Total pack years: 80.00    Types: Cigarettes    Quit date: 06/27/2005    Years since quitting: 17.0   Smokeless tobacco: Former   Tobacco comments:    quit Jun 27 2005  Vaping Use   Vaping Use: Never used  Substance and Sexual Activity   Alcohol use: No   Drug use: No   Sexual activity: Not Currently  Other Topics Concern   Not on file  Social History Narrative   Lives by self.   Social Determinants of Health   Financial Resource Strain: Low Risk  (03/25/2022)   Overall Financial Resource Strain (CARDIA)    Difficulty of Paying Living Expenses: Not hard at all  Food Insecurity: No Food Insecurity (03/25/2022)   Hunger Vital Sign    Worried About Running Out of Food in the Last Year: Never true    Ran Out of Food in the Last Year: Never true  Transportation Needs: No Transportation Needs (03/25/2022)   PRAPARE - Hydrologist (Medical): No    Lack of Transportation (Non-Medical): No  Physical Activity: Insufficiently Active (03/25/2022)   Exercise Vital Sign    Days of Exercise  per Week: 3 days    Minutes of Exercise per Session: 20 min  Stress: No Stress Concern Present (03/25/2022)   [place]    Feeling of Stress : Only a little  Social Connections: Moderately Integrated (03/25/2022)   Social Connection and Isolation Panel [NHANES]    Frequency of Communication with Friends and Family: More than three times a week    Frequency of Social Gatherings with Friends and Family: More than three times a week    Attends Religious Services: More than 4 times per year    Active Member of [employer] or Organizations: Yes  Attends Archivist Meetings: More than 4 times per year    Marital Status: Divorced  Intimate Partner Violence: Not At Risk (03/25/2022)   Humiliation, Afraid, Rape, and Kick questionnaire    Fear of Current or Ex-Partner: No    Emotionally Abused: No    Physically Abused: No    Sexually Abused: No   Family History  Problem Relation Age of Onset   Hypertension Other    Heart disease Mother        MI   Alcohol abuse Father    Autism Son    Heart disease Maternal Grandmother    Cancer Son        colon   Miscarriages / Stillbirths Maternal Aunt    Current Outpatient Medications on File Prior to Visit  Medication Sig   acetaminophen (TYLENOL) 500 MG tablet Take 1,000 mg by mouth every 8 (eight) hours as needed.   allopurinol (ZYLOPRIM) 100 MG tablet Take 1 tablet (100 mg total) by mouth daily.   amLODipine (NORVASC) 10 MG tablet TAKE 1 TABLET DAILY   apixaban (ELIQUIS) 5 MG TABS tablet Take 1 tablet (5 mg total) by mouth 2 (two) times daily.   ascorbic acid (VITAMIN C) 1000 MG tablet Take 1,000 mg by mouth daily.   atorvastatin (LIPITOR) 20 MG tablet TAKE 1 TABLET DAILY   Baclofen 5 MG TABS Take 5 mg by mouth at bedtime.   COMBIVENT RESPIMAT 20-100 MCG/ACT AERS respimat INHALE 1 PUFF EVERY 6 HOURS AS NEEDED FOR WHEEZING/SOB   CONTOUR NEXT TEST test strip Use to check blood  sugar up to twice a day.   desonide (DESOWEN) 0.05 % lotion Apply topically 2 (two) times daily.   Dulaglutide 3 MG/0.5ML SOPN Inject into the skin every Tuesday.   furosemide (LASIX) 20 MG tablet TAKE 3 TABLETS ONCE DAILY (DOSE INCREASE)   JARDIANCE 10 MG TABS tablet Take 10 mg by mouth daily.   levothyroxine (SYNTHROID) 25 MCG tablet Take 1 tablet (25 mcg total) by mouth daily before breakfast.   metoprolol succinate (TOPROL-XL) 50 MG 24 hr tablet Take 1 tablet (50 mg total) by mouth daily. Take with or immediately following a meal.   Multiple Vitamin (MULTIVITAMIN) capsule Take 1 capsule by mouth daily.   omeprazole (PRILOSEC) 20 MG capsule Take 1 capsule (20 mg total) by mouth 2 (two) times daily before a meal.   temazepam (RESTORIL) 30 MG capsule TAKE 1 CAPSULE BY MOUTH AT BEDTIME AS NEEDED SLEEP   traMADol (ULTRAM) 50 MG tablet TAKE 1 TABLET BY MOUTH TWICE DAILY   No current facility-administered medications on file prior to visit.    Review of Systems Per HPI unless specifically indicated above     Objective:    BP (!) 148/62   Pulse 79   Ht _0  (1.499 m)   Wt 150 lb 9.6 oz (68.3 kg)   SpO2 96%   BMI 30.42 kg/m   Wt Readings from Last 3 Encounters:  06/26/22 150 lb 9.6 oz (68.3 kg)  04/30/22 150 lb (68 kg)  04/04/22 147 lb 8 oz (66.9 kg)    Physical Exam Vitals and nursing note reviewed.  Constitutional:      General: She is not in acute distress.    Appearance: She is well-developed. She is obese. She is not diaphoretic.     Comments: Well-appearing, comfortable, cooperative  HENT:     Head: Normocephalic and atraumatic.  Eyes:     General:  Right eye: No discharge.        Left eye: No discharge.     Conjunctiva/sclera: Conjunctivae normal.  Neck:     Thyroid: No thyromegaly.  Cardiovascular:     Rate and Rhythm: Normal rate and regular rhythm.     Heart sounds: Normal heart sounds. No murmur heard. Pulmonary:     Effort: Pulmonary effort is  normal. No respiratory distress.     Breath sounds: Normal breath sounds. No wheezing or rales.  Musculoskeletal:        General: Normal range of motion.     Cervical back: Normal range of motion and neck supple.  Lymphadenopathy:     Cervical: No cervical adenopathy.  Skin:    General: Skin is warm and dry.     Findings: No erythema or rash.  Neurological:     Mental Status: She is alert and oriented to person, place, and time.  Psychiatric:        Behavior: Behavior normal.     Comments: Well groomed, good eye contact, normal speech and thoughts      ________________________________________________________ PROCEDURE NOTE Date: 06/26/22 Right Shoulder subacromial injection Discussed benefits and risks (including pain, bleeding, infection, steroid flare). Verbal consent given by patient. Medication:  1 cc Depo-medrol 62m and 4 cc Lidocaine 1% without epi Time Out taken  Landmarks identified. Area cleansed with alcohol wipes. Using 21 gauge and 1, 1/2 inch needle, Right subacromial bursa space was injected (with above listed medication) via posterior approach cold spray used for superficial anesthetic. Sterile bandage placed. Patient tolerated procedure well without bleeding or paresthesias. No complications.    I have personally reviewed the radiology report from 04/24/22 Neck and Shoulder  X-ray  1. 2 views of the right shoulder ordered and interpreted on today's visit does show some impingement anatomy of the greater tuberosity riding against the acromion. There may be some very early degenerative changes to the glenohumeral articulation. He also is noted to have some degenerative changes to the [facility] Inc[facility]. 2. 2 views of the cervical spine ordered and interpreted on today's visit shows normal lordotic curve. He is noted to have significant degenerative disc disease at this C2-3, C3-4, and C4-C5. No acute bony abnormalities noted.   Results for orders placed or performed in visit  on 06/18/22  T4, free  Result Value Ref Range   Free T4 1.3 0.8 - 1.8 ng/dL  TSH  Result Value Ref Range   TSH 0.59 0.40 - 4.50 mIU/L  Hemoglobin A1c  Result Value Ref Range   Hgb A1c MFr Bld 6.4 (H) <5.7 % of total Hgb   Mean Plasma Glucose 137 mg/dL   eAG (mmol/L) 7.6 mmol/L  Lipid panel  Result Value Ref Range   Cholesterol 131 <200 mg/dL   HDL 61 > OR = 50 mg/dL   Triglycerides 133 <150 mg/dL   LDL Cholesterol (Calc) 49 mg/dL (calc)   Total CHOL/HDL Ratio 2.1 <5.0 (calc)   Non-HDL Cholesterol (Calc) 70 <130 mg/dL (calc)  CBC with Differential/Platelet  Result Value Ref Range   WBC 4.1 3.8 - 10.8 Thousand/uL   RBC 4.72 3.80 - 5.10 Million/uL   Hemoglobin 14.1 11.7 - 15.5 g/dL   HCT 41.8 35.0 - 45.0 %   MCV 88.6 80.0 - 100.0 fL   MCH 29.9 27.0 - 33.0 pg   MCHC 33.7 32.0 - 36.0 g/dL   RDW 14.2 11.0 - 15.0 %   Platelets 180 140 - 400 Thousand/uL  MPV 12.1 7.5 - 12.5 fL   Neutro Abs 2,181 1,500 - 7,800 cells/uL   Lymphs Abs 1,292 850 - 3,900 cells/uL   Absolute Monocytes 271 200 - 950 cells/uL   Eosinophils Absolute 328 15 - 500 cells/uL   Basophils Absolute 29 0 - 200 cells/uL   Neutrophils Relative % 53.2 %   Total Lymphocyte 31.5 %   Monocytes Relative 6.6 %   Eosinophils Relative 8.0 %   Basophils Relative 0.7 %  COMPLETE METABOLIC PANEL WITH GFR  Result Value Ref Range   Glucose, Bld 122 (H) 65 - 99 mg/dL   BUN 22 7 - 25 mg/dL   Creat 1.59 (H) 0.60 - 1.00 mg/dL   eGFR 34 (L) > OR = 60 mL/min/1.53m   BUN/Creatinine Ratio 14 6 - 22 (calc)   Sodium 142 135 - 146 mmol/L   Potassium 3.8 3.5 - 5.3 mmol/L   Chloride 102 98 - 110 mmol/L   CO2 29 20 - 32 mmol/L   Calcium 10.4 8.6 - 10.4 mg/dL   Total Protein 7.0 6.1 - 8.1 g/dL   Albumin 4.5 3.6 - 5.1 g/dL   Globulin 2.5 1.9 - 3.7 g/dL (calc)   AG Ratio 1.8 1.0 - 2.5 (calc)   Total Bilirubin 0.4 0.2 - 1.2 mg/dL   Alkaline phosphatase (APISO) 71 37 - 153 U/L   AST 24 10 - 35 U/L   ALT 21 6 - 29 U/L       Assessment & Plan:   Problem List Items Addressed This Visit     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)   Chronic bursitis of left shoulder   Chronic right shoulder pain   Hypothyroidism   Type 2 diabetes mellitus with hyperlipidemia ([place]   Other Visit Diagnoses     Mixed hyperlipidemia           Updated Health Maintenance information Reviewed recent lab results with patient Encouraged improvement to lifestyle with diet and exercise Goal of weight loss  T2DM stable at A1c 6.4   Continue w/ [facility] for management   Continue w/ Cardiology, has upcoming pacemaker replacement scheduled  R Shoulder chronic bursitis Recurrent flare, she improved on injection subacromial from ortho 04/2022 but she resumed heavier work on it too soon. Will repeat injection today, counseling that it is a bit early but she is very interested in repeating now and cannot delay. Completed R shoulder subacromial injection today, pending result would suggest next return to Orthopedic if not improving as expected   No orders of the defined types were placed in this encounter.     Meds ordered this encounter  Medications   methylPREDNISolone acetate (DEPO-MEDROL) injection 40 mg   lidocaine (PF) (XYLOCAINE) 1 % injection 4 mL      Follow up plan: Return in about 6 months (around 12/25/2022) for 6 month DM A1c, Shoulder Joint Pain.  ANobie Putnam DO [facility] 06/26/2022, 9:57 AM

## 2022-06-26 NOTE — Patient Instructions (Addendum)
Thank you for coming to the office today.  Recent Labs    12/25/21 0942 06/19/22 1008  HGBA1C 6.3* 6.4*   Kidney function improved  Right shoulder injection today  If not improved 2-3 months, would suggest return to Orthopedics.  Keep on current medications.   Please schedule a Follow-up Appointment to: Return in about 6 months (around 12/25/2022) for 6 month DM A1c, Shoulder Joint Pain.  If you have any other questions or concerns, please feel free to call the office or send a message through [place]. You may also schedule an earlier appointment if necessary.  Additionally, you may be receiving a survey about your experience at our office within a few days to 1 week by e-mail or mail. We value your feedback.  [name], DO [place]

## 2022-07-11 ENCOUNTER — Other Ambulatory Visit: Payer: Self-pay | Admitting: Family Medicine

## 2022-07-11 DIAGNOSIS — M159 Polyosteoarthritis, unspecified: Secondary | ICD-10-CM

## 2022-07-11 DIAGNOSIS — G8929 Other chronic pain: Secondary | ICD-10-CM

## 2022-07-11 NOTE — Telephone Encounter (Signed)
Requested medication (s) are due for refill today: yes  Requested medication (s) are on the active medication list: yes  Last refill:  05/08/22 #60 2 RF  Future visit scheduled: yes  Notes to clinic:  med not delegated to NT to RF   Requested Prescriptions  Pending Prescriptions Disp Refills   traMADol (ULTRAM) 50 MG tablet [Pharmacy Med Name: TRAMADOL HCL 50 MG TAB] 60 tablet     Sig: TAKE 1 TABLET BY MOUTH TWICE DAILY     Not Delegated - Analgesics:  Opioid Agonists Failed - 07/11/2022 11:24 AM      Failed - This refill cannot be delegated      Failed - Urine Drug Screen completed in last 360 days      Passed - Valid encounter within last 3 months    Recent Outpatient Visits           2 weeks ago Type 2 diabetes mellitus with hyperlipidemia [facility])   [place], DO   3 months ago Chronic right shoulder pain   [place], DO   6 months ago Type 2 diabetes mellitus with hyperlipidemia [facility])   [facility] Olin Hauser, DO   1 year ago Benign hypertension with CKD (chronic kidney disease) stage IV [facility])   [facility] Olin Hauser, DO   1 year ago Benign hypertension with CKD (chronic kidney disease) stage IV [facility])   [facility] [place], Devonne Doughty, DO       Future Appointments             In 5 months Parks [place], Devonne Doughty, DO [facility], [place] [facility]

## 2022-07-16 ENCOUNTER — Encounter: Payer: Medicare Other | Admitting: Internal Medicine

## 2022-07-16 ENCOUNTER — Telehealth: Payer: Self-pay | Admitting: Family Medicine

## 2022-07-16 DIAGNOSIS — M5030 Other cervical disc degeneration, unspecified cervical region: Secondary | ICD-10-CM

## 2022-07-16 DIAGNOSIS — M159 Polyosteoarthritis, unspecified: Secondary | ICD-10-CM

## 2022-07-16 DIAGNOSIS — G8929 Other chronic pain: Secondary | ICD-10-CM

## 2022-07-16 NOTE — Telephone Encounter (Signed)
Called patient and submitted referral.   Reason for Referral: referral for chronic neck and spine pain impacting R side mostly upper extremity R shoulder, history of bursitis, has seen orthopedics for evaluation at [facility] clinic treatment and injection with limited results, now requesting pain management evaluation, has tried Tramadol AS NEEDED, cannot take NSAID due to kidney impairment  Has the referral been discussed with the patient?: yes  Designated contact for the referral if not the patient (name/phone number): patient  Has the patient seen a specialist for this issue before?: Yes  If so, who (practice/provider)? Dr Roland Rack and Vance Peper PA [place] 04/2022  Does the patient have a provider or location preference for the referral?: yes - [facility] [place] Would the patient like to see previous specialist if applicable?   [name], [facility] 07/16/2022, 12:47 PM

## 2022-07-16 NOTE — Telephone Encounter (Signed)
Referral Request - Has patient seen PCP for this complaint? yes *If NO, is insurance requiring patient see PCP for this issue before PCP can refer them? Referral for which specialty: pain management Preferred provider/office: ? Reason for referral: pain in neck

## 2022-07-22 ENCOUNTER — Other Ambulatory Visit: Payer: Self-pay | Admitting: Internal Medicine

## 2022-07-22 NOTE — Telephone Encounter (Signed)
Rx refill sent to pharmacy. 

## 2022-07-30 ENCOUNTER — Ambulatory Visit (INDEPENDENT_AMBULATORY_CARE_PROVIDER_SITE_OTHER): Payer: Medicare Other

## 2022-07-30 DIAGNOSIS — I442 Atrioventricular block, complete: Secondary | ICD-10-CM

## 2022-07-30 LAB — CUP PACEART REMOTE DEVICE CHECK
Battery Remaining Longevity: 134 mo
Battery Voltage: 3.17 V
Brady Statistic AP VP Percent: 97.8 %
Brady Statistic AP VS Percent: 0.04 %
Brady Statistic AS VP Percent: 2.01 %
Brady Statistic AS VS Percent: 0.15 %
Brady Statistic RA Percent Paced: 97.54 %
Brady Statistic RV Percent Paced: 99.81 %
Date Time Interrogation Session: 20231226024045
Implantable Lead Connection Status: 753985
Implantable Lead Connection Status: 753985
Implantable Lead Implant Date: 20141217
Implantable Lead Implant Date: 20141217
Implantable Lead Location: 753859
Implantable Lead Location: 753860
Implantable Lead Model: 5076
Implantable Lead Model: 5076
Implantable Pulse Generator Implant Date: 20230926
Lead Channel Impedance Value: 380 Ohm
Lead Channel Impedance Value: 418 Ohm
Lead Channel Impedance Value: 475 Ohm
Lead Channel Impedance Value: 532 Ohm
Lead Channel Pacing Threshold Amplitude: 0.375 V
Lead Channel Pacing Threshold Amplitude: 0.625 V
Lead Channel Pacing Threshold Pulse Width: 0.4 ms
Lead Channel Pacing Threshold Pulse Width: 0.4 ms
Lead Channel Sensing Intrinsic Amplitude: 12.625 mV
Lead Channel Sensing Intrinsic Amplitude: 12.625 mV
Lead Channel Sensing Intrinsic Amplitude: 3.375 mV
Lead Channel Sensing Intrinsic Amplitude: 3.375 mV
Lead Channel Setting Pacing Amplitude: 1.75 V
Lead Channel Setting Pacing Amplitude: 2 V
Lead Channel Setting Pacing Pulse Width: 0.4 ms
Lead Channel Setting Sensing Sensitivity: 2 mV
Zone Setting Status: 755011

## 2022-08-01 ENCOUNTER — Other Ambulatory Visit: Payer: Self-pay | Admitting: Internal Medicine

## 2022-08-01 DIAGNOSIS — I4819 Other persistent atrial fibrillation: Secondary | ICD-10-CM

## 2022-08-01 NOTE — Telephone Encounter (Signed)
Prescription refill request for Eliquis received. Indication: AF Last office visit: 04/04/22  Olin Pia MD Scr: 1.59 on 06/19/22 Age: 71 Weight: 66.9kg  Based on above findings Eliquis '5mg'$  twice daily is the appropriate dose.  Refill approved.

## 2022-08-08 ENCOUNTER — Other Ambulatory Visit: Payer: Self-pay | Admitting: Family Medicine

## 2022-08-08 DIAGNOSIS — F5101 Primary insomnia: Secondary | ICD-10-CM

## 2022-08-08 NOTE — Telephone Encounter (Signed)
Requested medication (s) are due for refill today - yes  Requested medication (s) are on the active medication list -yes  Future visit scheduled -yes  Last refill: 05/08/22 #30 2RF  Notes to clinic: non delegated Rx  Requested Prescriptions  Pending Prescriptions Disp Refills   temazepam (RESTORIL) 30 MG capsule [Pharmacy Med Name: TEMAZEPAM 30 MG CAP] 30 capsule     Sig: TAKE 1 CAPSULE BY MOUTH AT BEDTIME AS NEEDED SLEEP     Not Delegated - Psychiatry: Anxiolytics/Hypnotics 2 Failed - 08/08/2022 11:41 AM      Failed - This refill cannot be delegated      Failed - Urine Drug Screen completed in last 360 days      Passed - Patient is not pregnant      Passed - Valid encounter within last 6 months    Recent Outpatient Visits           1 month ago Type 2 diabetes mellitus with hyperlipidemia ([place])   [place], DO   4 months ago Chronic right shoulder pain   [place], Devonne Doughty, DO   7 months ago Type 2 diabetes mellitus with hyperlipidemia [facility])   [facility] Olin Hauser, DO   1 year ago Benign hypertension with CKD (chronic kidney disease) stage IV ([place])   [facility] Olin Hauser, DO   1 year ago Benign hypertension with CKD (chronic kidney disease) stage IV ([place])   [facility] Parks [place], Devonne Doughty, DO       Future Appointments             In 4 months Parks Ranger, Devonne Doughty, DO [facility], [facility]               Requested Prescriptions  Pending Prescriptions Disp Refills   temazepam (RESTORIL) 30 MG capsule [Pharmacy Med Name: TEMAZEPAM 30 MG CAP] 30 capsule     Sig: TAKE 1 CAPSULE BY MOUTH AT BEDTIME AS NEEDED SLEEP     Not Delegated - Psychiatry: Anxiolytics/Hypnotics 2 Failed - 08/08/2022 11:41 AM      Failed - This refill cannot be delegated      Failed - Urine Drug Screen completed in last  360 days      Passed - Patient is not pregnant      Passed - Valid encounter within last 6 months    Recent Outpatient Visits           1 month ago Type 2 diabetes mellitus with hyperlipidemia ([place])   [place], DO   4 months ago Chronic right shoulder pain   [facility], Devonne Doughty, DO   7 months ago Type 2 diabetes mellitus with hyperlipidemia [facility])   [facility] Olin Hauser, DO   1 year ago Benign hypertension with CKD (chronic kidney disease) stage IV [facility])   [facility] Olin Hauser, DO   1 year ago Benign hypertension with CKD (chronic kidney disease) stage IV [facility])   [facility] [place], Devonne Doughty, DO       Future Appointments             In 4 months Parks Ranger, Devonne Doughty, DO [facility]

## 2022-08-14 ENCOUNTER — Ambulatory Visit
Payer: Medicare Other | Attending: Student in an Organized Health Care Education/Training Program | Admitting: Student in an Organized Health Care Education/Training Program

## 2022-08-14 ENCOUNTER — Encounter: Payer: Self-pay | Admitting: Student in an Organized Health Care Education/Training Program

## 2022-08-14 VITALS — BP 154/70 | HR 64 | Temp 97.1°F | Ht 59.0 in | Wt 150.0 lb

## 2022-08-14 DIAGNOSIS — M5030 Other cervical disc degeneration, unspecified cervical region: Secondary | ICD-10-CM | POA: Insufficient documentation

## 2022-08-14 DIAGNOSIS — M542 Cervicalgia: Secondary | ICD-10-CM | POA: Diagnosis not present

## 2022-08-14 DIAGNOSIS — M792 Neuralgia and neuritis, unspecified: Secondary | ICD-10-CM | POA: Diagnosis not present

## 2022-08-14 DIAGNOSIS — M5416 Radiculopathy, lumbar region: Secondary | ICD-10-CM | POA: Insufficient documentation

## 2022-08-14 DIAGNOSIS — M25511 Pain in right shoulder: Secondary | ICD-10-CM | POA: Diagnosis not present

## 2022-08-14 DIAGNOSIS — G8929 Other chronic pain: Secondary | ICD-10-CM | POA: Insufficient documentation

## 2022-08-14 DIAGNOSIS — G894 Chronic pain syndrome: Secondary | ICD-10-CM | POA: Insufficient documentation

## 2022-08-14 DIAGNOSIS — M19011 Primary osteoarthritis, right shoulder: Secondary | ICD-10-CM | POA: Insufficient documentation
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
## 2022-08-14 NOTE — Progress Notes (Signed)
Safety precautions to be maintained throughout the outpatient stay will include: orient to surroundings, keep bed in low position, maintain call bell within reach at all times, provide assistance with transfer out of bed and ambulation.  

## 2022-08-14 NOTE — Patient Instructions (Signed)
Patient instructed to call after she has completed her C-MRI and Right shoulder MRI to review and discuss results  Patient has been told that if radiology tells her that she cannot have MRI bc of pacemaker status, to let us know and I will change to CT scan.

## 2022-08-14 NOTE — Addendum Note (Signed)
Addended by: Gillis Santa on: 08/14/2022 10:03 AM   Modules accepted: Orders

## 2022-08-19 DIAGNOSIS — E1122 Type 2 diabetes mellitus with diabetic chronic kidney disease: Secondary | ICD-10-CM | POA: Diagnosis not present

## 2022-08-19 DIAGNOSIS — N184 Chronic kidney disease, stage 4 (severe): Secondary | ICD-10-CM | POA: Diagnosis not present

## 2022-08-20 ENCOUNTER — Encounter: Payer: Medicare Other | Admitting: Internal Medicine

## 2022-08-21 NOTE — Progress Notes (Signed)
Remote pacemaker transmission.   

## 2022-08-26 DIAGNOSIS — E1122 Type 2 diabetes mellitus with diabetic chronic kidney disease: Secondary | ICD-10-CM | POA: Diagnosis not present

## 2022-08-26 DIAGNOSIS — N2581 Secondary hyperparathyroidism of renal origin: Secondary | ICD-10-CM | POA: Diagnosis not present

## 2022-08-26 DIAGNOSIS — N1832 Chronic kidney disease, stage 3b: Secondary | ICD-10-CM | POA: Diagnosis not present

## 2022-08-26 DIAGNOSIS — I10 Essential (primary) hypertension: Secondary | ICD-10-CM | POA: Diagnosis not present

## 2022-09-02 ENCOUNTER — Ambulatory Visit: Payer: Medicare Other | Attending: Internal Medicine | Admitting: Internal Medicine

## 2022-09-02 ENCOUNTER — Encounter: Payer: Self-pay | Admitting: Internal Medicine

## 2022-09-02 VITALS — BP 120/80 | HR 65 | Ht 64.5 in | Wt 149.2 lb

## 2022-09-02 DIAGNOSIS — Z950 Presence of cardiac pacemaker: Secondary | ICD-10-CM | POA: Diagnosis not present

## 2022-09-02 DIAGNOSIS — I4819 Other persistent atrial fibrillation: Secondary | ICD-10-CM | POA: Diagnosis not present

## 2022-09-02 DIAGNOSIS — I493 Ventricular premature depolarization: Secondary | ICD-10-CM | POA: Diagnosis not present

## 2022-09-02 DIAGNOSIS — I10 Essential (primary) hypertension: Secondary | ICD-10-CM | POA: Insufficient documentation

## 2022-09-02 DIAGNOSIS — Z79899 Other long term (current) drug therapy: Secondary | ICD-10-CM | POA: Insufficient documentation

## 2022-09-02 DIAGNOSIS — I442 Atrioventricular block, complete: Secondary | ICD-10-CM | POA: Diagnosis not present

## 2022-09-02 LAB — PACEMAKER DEVICE OBSERVATION

## 2022-09-02 NOTE — Progress Notes (Signed)
Patient Care Team: Olin Hauser, DO as PCP - General (Family Medicine) Lavonia Dana, MD as Consulting Physician (Nephrology) Deboraha Sprang, MD as Consulting Physician (Cardiology) Edrick Kins, DPM as Consulting Physician (Podiatry)   HPI  Renee Boyd is a 72 y.o. female Seen in followup for largely asymptomatic persistent atrial fibrillation and previously implanted pacemaker 2014 [employer], genchange 9/23  Anticoagulation  w Apixoban   no bleeding   Previously exposed to amiodarone when started [place].   Underwent cardioversion 7/21 with significant improvement in symptoms.  She reverted back to atrial fib/flutter shortly after her last office visit.  Anticipated repeat cardioversion following reloading with amiodarone to assess benefit of restoring sinus rhythm done 3/22; not tolerated     The patient denies chest pain, nocturnal dyspnea, orthopnea or peripheral edema.  There have been no palpitations, lightheadedness or syncope.  Complains of shortness of breath which is chronic.  Major issue currently however, is what sounds like radicular pain coming from her right neck down into her right arm which is debilitating.  MRI is scheduled..   Records and Results Reviewed Date Cr K Hgb  10/17 1.36  8.8  2/18 1.73 4.9   6/18  1.3  11.1  1/20 1.2 4.3 11.3  1/21 1.32 3.2 9.6  7/21 2.37 4.2 12.4  8/21 2.64 (CE)    12/21 2.64 (CE)     3/22 1.9. 4.5 12.1  9/22 1.78 4.4 12.0  11/22 2.35 4.2   2/23 2.53 4.4   5/23 2.09<< 3.16 4.1 12.8  11/23 1.59 3.8 14.1   DATE TEST EF   7/17 Echo   60-65 % Pulm HTN 64 (no E/E')  12/19  Echo  50-55% LVH severe (17/16)   PAsys 50-55   MR mild LAE(37/2.0/34)  1/20 CTA  CAD 2V   FFR > 0.8 LAD/RCA  5/22 Myoview  75% No ischemia          .   Thromboembolic risk factors ( age -41, HTN-1, TIA/CVA-2, DM-1 , Gender-1) for a CHADSVASc Score of 6     Past Medical History:  Diagnosis Date   Complete heart  block ([place])    a. s/p MDT PPM 2006 with device generator replacement 2014; b. followed by Dr. Caryl Comes   [facility])    GERD (gastroesophageal reflux disease)    Hypertension    Hypothyroid    Pacemaker    Persistent atrial fibrillation ([place])    a. on Eliquis; b. CHADS2VASc 6 (HTN. age x 1, DM, TIA x 2, female); c. s/p DCCV 05/28/18   Pulmonary hypertension ([place])    a. echo 2017: EF of 60-65%, no RWMA, mildly dilated left atrium, RVSF normal, PASP 64 mmHg   TIA (transient ischemic attack)     Past Surgical History:  Procedure Laterality Date   ABDOMINAL HYSTERECTOMY     CARDIOVERSION N/A 05/28/2018   Procedure: CARDIOVERSION;  Surgeon: Minna Merritts, MD;  Location: [facility] ORS;  Service: Cardiovascular;  Laterality: N/A;   CARDIOVERSION N/A 03/01/2020   Procedure: CARDIOVERSION;  Surgeon: Kate Sable, MD;  Location: [facility];  Service: Cardiovascular;  Laterality: N/A;   CARDIOVERSION N/A 10/25/2020   Procedure: CARDIOVERSION;  Surgeon: Kate Sable, MD;  Location: [facility];  Service: Cardiovascular;  Laterality: N/A;   CHOLECYSTECTOMY     PPM GENERATOR CHANGEOUT N/A 04/30/2022   Procedure: PPM GENERATOR CHANGEOUT;  Surgeon: Deboraha Sprang, MD;  Location: [facility];  Service: Cardiovascular;  Laterality: N/A;   TONSILLECTOMY     TUBAL LIGATION      Current Outpatient Medications  Medication Sig Dispense Refill   acetaminophen (TYLENOL) 500 MG tablet Take 1,000 mg by mouth every 8 (eight) hours as needed.     allopurinol (ZYLOPRIM) 100 MG tablet Take 1 tablet (100 mg total) by mouth daily. 90 tablet 3   amLODipine (NORVASC) 10 MG tablet TAKE 1 TABLET DAILY 90 tablet 3   ascorbic acid (VITAMIN C) 1000 MG tablet Take 1,000 mg by mouth daily.     atorvastatin (LIPITOR) 20 MG tablet TAKE 1 TABLET DAILY 90 tablet 3   Baclofen 5 MG TABS Take 5 mg by mouth at bedtime. 90 tablet 3   COMBIVENT RESPIMAT 20-100 MCG/ACT AERS respimat INHALE 1 PUFF EVERY 6 HOURS AS  NEEDED FOR WHEEZING/SOB 4 g 2   CONTOUR NEXT TEST test strip Use to check blood sugar up to twice a day. 200 each 5   desonide (DESOWEN) 0.05 % lotion Apply topically 2 (two) times daily. 59 mL 0   Dulaglutide 3 MG/0.5ML SOPN Inject into the skin every Tuesday.     ELIQUIS 5 MG TABS tablet TAKE 1 TABLET BY MOUTH TWICE DAILY 180 tablet 1   furosemide (LASIX) 20 MG tablet Take 3 tablets (60 mg total) by mouth daily. TAKE 3 TABLETS ONCE DAILY (DOSE INCREASE) 270 tablet 2   JARDIANCE 10 MG TABS tablet Take 10 mg by mouth daily.     levothyroxine (SYNTHROID) 25 MCG tablet Take 1 tablet (25 mcg total) by mouth daily before breakfast. 90 tablet 1   metoprolol succinate (TOPROL-XL) 50 MG 24 hr tablet Take 1 tablet (50 mg total) by mouth daily. Take with or immediately following a meal. 90 tablet 3   Multiple Vitamin (MULTIVITAMIN) capsule Take 1 capsule by mouth daily.     omeprazole (PRILOSEC) 20 MG capsule Take 1 capsule (20 mg total) by mouth 2 (two) times daily before a meal. 180 capsule 2   temazepam (RESTORIL) 30 MG capsule TAKE 1 CAPSULE BY MOUTH AT BEDTIME AS NEEDED SLEEP 30 capsule 2   traMADol (ULTRAM) 50 MG tablet TAKE 1 TABLET BY MOUTH TWICE DAILY 60 tablet 2   No current facility-administered medications for this visit.    Allergies  Allergen Reactions   Morphine And Related Itching   Neosporin [Bacitracin-Polymyxin B]    Other     glue   Percocet [Oxycodone-Acetaminophen] Other (See Comments)    Hallucinations       Review of Systems negative except from HPI and PMH  Physical Exam  BP 120/80 (BP Location: Left Arm, Patient Position: Sitting, Cuff Size: Normal)   Pulse 65   Ht 5' 4.5" (1.638 m)   Wt 149 lb 4 oz (67.7 kg)   SpO2 96%   BMI 25.22 kg/m  Well developed and well nourished in no acute distress HENT normal Neck supple with JVP-flat Clear Device pocket well healed; without hematoma or erythema.  There is no tethering  Regular rate and rhythm, no  gallop No /  murmur Abd-soft with active BS No Clubbing cyanosis edema Skin-warm and dry A & Oriented  Grossly normal sensory and motor function  ECG ECG demonstrates AV pacing at 74  Device function is normal. Programming changes   See Paceart for details     Assessment and  Plan  Nausea and vomiting  Atrial fibrillation/flutter  persistent  Ventricular tachycardia-nonsustained  HFpEF chronic  Hypertension  Pacemaker  [employer]tronic   Sinus arrest  Complete heart block  Renal insufficiency  gr 4 ( CrCL 24)   Orthostatic hypotension  PVCs  Sleep disordered breathing and daytime somnolence averse to a sleep study  Infrequent interval atrial fibrillation.  Will continue to follow, and maintain apixaban properly dosed for weight and age  Blood pressure well-controlled on the metoprolol and amlodipine.  Device function is normal  Euvolemic.  Continue her furosemide.

## 2022-09-02 NOTE — Patient Instructions (Signed)
Medication Instructions:  Your physician recommends that you continue on your current medications as directed. Please refer to the Current Medication list given to you today.  *If you need a refill on your cardiac medications before your next appointment, please call your pharmacy*   Lab Work: None ordered If you have labs (blood work) drawn today and your tests are completely normal, you will receive your results only by: [place] (if you have MyChart) OR A paper copy in the mail If you have any lab test that is abnormal or we need to change your treatment, we will call you to review the results.   Testing/Procedures: None ordered   Follow-Up: At [facility], you and your health needs are our priority.  As part of our continuing mission to provide you with exceptional heart care, we have created designated Provider Care Teams.  These Care Teams include your primary Cardiologist (physician) and Advanced Practice Providers (APPs -  Physician Assistants and Nurse Practitioners) who all work together to provide you with the care you need, when you need it.  We recommend signing up for the patient portal called "MyChart".  Sign up information is provided on this After Visit Summary.  MyChart is used to connect with patients for Virtual Visits (Telemedicine).  Patients are able to view lab/test results, encounter notes, upcoming appointments, etc.  Non-urgent messages can be sent to your provider as well.   To learn more about what you can do with MyChart, go to [email or web address].    Your next appointment:   9 month(s)  Provider:   Virl Axe, MD

## 2022-09-17 ENCOUNTER — Ambulatory Visit (HOSPITAL_COMMUNITY)
Admission: RE | Admit: 2022-09-17 | Discharge: 2022-09-17 | Disposition: A | Discharge status: Home / Self Care | Payer: Medicare Other | Source: Ambulatory Visit | Attending: Student in an Organized Health Care Education/Training Program | Admitting: Student in an Organized Health Care Education/Training Program

## 2022-09-17 DIAGNOSIS — G8929 Other chronic pain: Secondary | ICD-10-CM | POA: Diagnosis not present

## 2022-09-17 DIAGNOSIS — M19011 Primary osteoarthritis, right shoulder: Secondary | ICD-10-CM

## 2022-09-17 DIAGNOSIS — G894 Chronic pain syndrome: Secondary | ICD-10-CM | POA: Diagnosis not present

## 2022-09-17 DIAGNOSIS — M25511 Pain in right shoulder: Secondary | ICD-10-CM | POA: Insufficient documentation

## 2022-09-17 DIAGNOSIS — M25411 Effusion, right shoulder: Secondary | ICD-10-CM | POA: Diagnosis not present

## 2022-09-17 DIAGNOSIS — M2578 Osteophyte, vertebrae: Secondary | ICD-10-CM | POA: Diagnosis not present

## 2022-09-17 DIAGNOSIS — M5030 Other cervical disc degeneration, unspecified cervical region: Secondary | ICD-10-CM

## 2022-09-17 DIAGNOSIS — M5416 Radiculopathy, lumbar region: Secondary | ICD-10-CM | POA: Insufficient documentation

## 2022-09-17 DIAGNOSIS — M5021 Other cervical disc displacement,  high cervical region: Secondary | ICD-10-CM | POA: Diagnosis not present

## 2022-09-17 DIAGNOSIS — M542 Cervicalgia: Secondary | ICD-10-CM

## 2022-09-17 DIAGNOSIS — M792 Neuralgia and neuritis, unspecified: Secondary | ICD-10-CM | POA: Insufficient documentation

## 2022-09-17 DIAGNOSIS — R600 Localized edema: Secondary | ICD-10-CM | POA: Diagnosis not present

## 2022-09-17 DIAGNOSIS — M50222 Other cervical disc displacement at C5-C6 level: Secondary | ICD-10-CM | POA: Diagnosis not present

## 2022-09-17 DIAGNOSIS — M75101 Unspecified rotator cuff tear or rupture of right shoulder, not specified as traumatic: Secondary | ICD-10-CM | POA: Diagnosis not present

## 2022-09-17 DIAGNOSIS — M4802 Spinal stenosis, cervical region: Secondary | ICD-10-CM | POA: Diagnosis not present

## 2022-09-19 ENCOUNTER — Ambulatory Visit: Payer: Medicare Other | Admitting: Student in an Organized Health Care Education/Training Program

## 2022-09-26 ENCOUNTER — Encounter: Payer: Self-pay | Admitting: Student in an Organized Health Care Education/Training Program

## 2022-09-26 ENCOUNTER — Telehealth: Payer: Self-pay | Admitting: Internal Medicine

## 2022-09-26 ENCOUNTER — Ambulatory Visit
Payer: Medicare Other | Attending: Student in an Organized Health Care Education/Training Program | Admitting: Student in an Organized Health Care Education/Training Program

## 2022-09-26 VITALS — BP 157/79 | HR 62 | Temp 97.3°F | Ht 59.0 in | Wt 149.0 lb

## 2022-09-26 DIAGNOSIS — M12811 Other specific arthropathies, not elsewhere classified, right shoulder: Secondary | ICD-10-CM | POA: Insufficient documentation

## 2022-09-26 DIAGNOSIS — M19011 Primary osteoarthritis, right shoulder: Secondary | ICD-10-CM | POA: Diagnosis not present

## 2022-09-26 DIAGNOSIS — M4802 Spinal stenosis, cervical region: Secondary | ICD-10-CM | POA: Insufficient documentation

## 2022-09-26 DIAGNOSIS — M75121 Complete rotator cuff tear or rupture of right shoulder, not specified as traumatic: Secondary | ICD-10-CM | POA: Diagnosis not present

## 2022-09-26 DIAGNOSIS — M792 Neuralgia and neuritis, unspecified: Secondary | ICD-10-CM | POA: Diagnosis not present

## 2022-09-26 DIAGNOSIS — G894 Chronic pain syndrome: Secondary | ICD-10-CM | POA: Insufficient documentation

## 2022-09-26 DIAGNOSIS — M25511 Pain in right shoulder: Secondary | ICD-10-CM | POA: Diagnosis not present

## 2022-09-26 DIAGNOSIS — M5412 Radiculopathy, cervical region: Secondary | ICD-10-CM | POA: Diagnosis not present

## 2022-09-26 DIAGNOSIS — G8929 Other chronic pain: Secondary | ICD-10-CM | POA: Diagnosis not present

## 2022-09-26 NOTE — Telephone Encounter (Signed)
Patient with diagnosis of afib on Eliquis for anticoagulation.    Procedure: Cervical Epidural, neck pain  Date of procedure: TBD   CHA2DS2-VASc Score = 6   This indicates a 9.7% annual risk of stroke. The patient's score is based upon: CHF History: 0 HTN History: 1 Diabetes History: 1 Stroke History: 2 Vascular Disease History: 0 Age Score: 1 Gender Score: 1      CrCl 26 ml/min Platelet count 231  Pt with a hx of TIA. Due to her renal function and spinal procedure, patient would need to hold Eliquis for 4 days. Will defer to Dr. Caryl Comes.   **This guidance is not considered finalized until pre-operative APP has relayed final recommendations.**
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
## 2022-09-26 NOTE — Patient Instructions (Addendum)
Please contact Dr Caryl Comes to get clearance to stop Eliquis 3 days prior to C-ESI  ____________________________________________________________________________________________  General Risks and Possible Complications  Patient Responsibilities: It is important that you read this as it is part of your informed consent. It is our duty to inform you of the risks and possible complications associated with treatments offered to you. It is your responsibility as a patient to read this and to ask questions about anything that is not clear or that you believe was not covered in this document.  Patient's Rights: You have the right to refuse treatment. You also have the right to change your mind, even after initially having agreed to have the treatment done. However, under this last option, if you wait until the last second to change your mind, you may be charged for the materials used up to that point.  Introduction: Medicine is not an Chief Strategy Officer. Everything in Medicine, including the lack of treatment(s), carries the potential for danger, harm, or loss (which is by definition: Risk). In Medicine, a complication is a secondary problem, condition, or disease that can aggravate an already existing one. All treatments carry the risk of possible complications. The fact that a side effects or complications occurs, does not imply that the treatment was conducted incorrectly. It must be clearly understood that these can happen even when everything is done following the highest safety standards.  No treatment: You can choose not to proceed with the proposed treatment alternative. The "PRO(s)" would include: avoiding the risk of complications associated with the therapy. The "CON(s)" would include: not getting any of the treatment benefits. These benefits fall under one of three categories: diagnostic; therapeutic; and/or palliative. Diagnostic benefits include: getting information which can ultimately lead to improvement  of the disease or symptom(s). Therapeutic benefits are those associated with the successful treatment of the disease. Finally, palliative benefits are those related to the decrease of the primary symptoms, without necessarily curing the condition (example: decreasing the pain from a flare-up of a chronic condition, such as incurable terminal cancer).  General Risks and Complications: These are associated to most interventional treatments. They can occur alone, or in combination. They fall under one of the following six (6) categories: no benefit or worsening of symptoms; bleeding; infection; nerve damage; allergic reactions; and/or death. No benefits or worsening of symptoms: In Medicine there are no guarantees, only probabilities. No healthcare provider can ever guarantee that a medical treatment will work, they can only state the probability that it may. Furthermore, there is always the possibility that the condition may worsen, either directly, or indirectly, as a consequence of the treatment. Bleeding: This is more common if the patient is taking a blood thinner, either prescription or over the counter (example: Goody Powders, Fish oil, Aspirin, Garlic, etc.), or if suffering a condition associated with impaired coagulation (example: Hemophilia, cirrhosis of the liver, low platelet counts, etc.). However, even if you do not have one on these, it can still happen. If you have any of these conditions, or take one of these drugs, make sure to notify your treating physician. Infection: This is more common in patients with a compromised immune system, either due to disease (example: diabetes, cancer, human immunodeficiency virus [HIV], etc.), or due to medications or treatments (example: therapies used to treat cancer and rheumatological diseases). However, even if you do not have one on these, it can still happen. If you have any of these conditions, or take one of these drugs, make sure to  notify your treating  physician. Nerve Damage: This is more common when the treatment is an invasive one, but it can also happen with the use of medications, such as those used in the treatment of cancer. The damage can occur to small secondary nerves, or to large primary ones, such as those in the spinal cord and brain. This damage may be temporary or permanent and it may lead to impairments that can range from temporary numbness to permanent paralysis and/or brain death. Allergic Reactions: Any time a substance or material comes in contact with our body, there is the possibility of an allergic reaction. These can range from a mild skin rash (contact dermatitis) to a severe systemic reaction (anaphylactic reaction), which can result in death. Death: In general, any medical intervention can result in death, most of the time due to an unforeseen complication. ____________________________________________________________________________________________    ______________________________________________________________________  Preparing for your procedure  During your procedure appointment there will be: No Prescription Refills. No disability issues to discussed. No medication changes or discussions.  Instructions: Food intake: Avoid eating anything solid for at least 8 hours prior to your procedure. Clear liquid intake: You may take clear liquids such as water up to 2 hours prior to your procedure. (No carbonated drinks. No soda.) Transportation: Unless otherwise stated by your physician, bring a driver. Morning Medicines: Except for blood thinners, take all of your other morning medications with a sip of water. Make sure to take your heart and blood pressure medicines. If your blood pressure's lower number is above 100, the case will be rescheduled. Blood thinners: Make sure to stop your blood thinners as instructed.  If you take a blood thinner, but were not instructed to stop it, call our office [phone] and  ask to talk to a nurse. Not stopping a blood thinner prior to certain procedures could lead to serious complications. Diabetics on insulin: Notify the staff so that you can be scheduled 1st case in the morning. If your diabetes requires high dose insulin, take only  of your normal insulin dose the morning of the procedure and notify the staff that you have done so. Preventing infections: Shower with an antibacterial soap the morning of your procedure.  Build-up your immune system: Take 1000 mg of Vitamin C with every meal (3 times a day) the day prior to your procedure. Antibiotics: Inform the nursing staff if you are taking any antibiotics or if you have any conditions that may require antibiotics prior to procedures. (Example: recent joint implants)   Pregnancy: If you are pregnant make sure to notify the nursing staff. Not doing so may result in injury to the fetus, including death.  Sickness: If you have a cold, fever, or any active infections, call and cancel or reschedule your procedure. Receiving steroids while having an infection may result in complications. Arrival: You must be in the facility at least 30 minutes prior to your scheduled procedure. Tardiness: Your scheduled time is also the cutoff time. If you do not arrive at least 15 minutes prior to your procedure, you will be rescheduled.  Children: Do not bring any children with you. Make arrangements to keep them home. Dress appropriately: There is always a possibility that your clothing may get soiled. Avoid long dresses. Valuables: Do not bring any jewelry or valuables.  Reasons to call and reschedule or cancel your procedure: (Following these recommendations will minimize the risk of a serious complication.) Surgeries: Avoid having procedures within 2 weeks of any surgery. (  Avoid for 2 weeks before or after any surgery). Flu Shots: Avoid having procedures within 2 weeks of a flu shots or . (Avoid for 2 weeks before or after  immunizations). Barium: Avoid having a procedure within 7-10 days after having had a radiological study involving the use of radiological contrast. (Myelograms, Barium swallow or enema study). Heart attacks: Avoid any elective procedures or surgeries for the initial 6 months after a "Myocardial Infarction" (Heart Attack). Blood thinners: It is imperative that you stop these medications before procedures. Let us know if you if you take any blood thinner.  Infection: Avoid procedures during or within two weeks of an infection (including chest colds or gastrointestinal problems). Symptoms associated with infections include: Localized redness, fever, chills, night sweats or profuse sweating, burning sensation when voiding, cough, congestion, stuffiness, runny nose, sore throat, diarrhea, nausea, vomiting, cold or Flu symptoms, recent or current infections. It is specially important if the infection is over the area that we intend to treat. Heart and lung problems: Symptoms that may suggest an active cardiopulmonary problem include: cough, chest pain, breathing difficulties or shortness of breath, dizziness, ankle swelling, uncontrolled high or unusually low blood pressure, and/or palpitations. If you are experiencing any of these symptoms, cancel your procedure and contact your primary care physician for an evaluation.  Remember:  Regular Business hours are:  Monday to Thursday 8:00 AM to 4:00 PM  Provider's Schedule: Milinda Pointer, MD:  Procedure days: Tuesday and Thursday 7:30 AM to 4:00 PM  Gillis Santa, MD:  Procedure days: Monday and Wednesday 7:30 AM to 4:00 PM  ______________________________________________________________________   Epidural Steroid Injection Patient Information  Description: The epidural space surrounds the nerves as they exit the spinal cord.  In some patients, the nerves can be compressed and inflamed by a bulging disc or a tight spinal canal (spinal stenosis).  By  injecting steroids into the epidural space, we can bring irritated nerves into direct contact with a potentially helpful medication.  These steroids act directly on the irritated nerves and can reduce swelling and inflammation which often leads to decreased pain.  Epidural steroids may be injected anywhere along the spine and from the neck to the low back depending upon the location of your pain.   After numbing the skin with local anesthetic (like Novocaine), a small needle is passed into the epidural space slowly.  You may experience a sensation of pressure while this is being done.  The entire block usually last less than 10 minutes.  Conditions which may be treated by epidural steroids:  Low back and leg pain Neck and arm pain Spinal stenosis Post-laminectomy syndrome Herpes zoster (shingles) pain Pain from compression fractures  Preparation for the injection:  Do not eat any solid food or dairy products within 8 hours of your appointment.  You may drink clear liquids up to 3 hours before appointment.  Clear liquids include water, black coffee, juice or soda.  No milk or cream please. You may take your regular medication, including pain medications, with a sip of water before your appointment  Diabetics should hold regular insulin (if taken separately) and take 1/2 normal NPH dos the morning of the procedure.  Carry some sugar containing items with you to your appointment. A driver must accompany you and be prepared to drive you home after your procedure.  Bring all your current medications with your. An IV may be inserted and sedation may be given at the discretion of the physician.   A blood  pressure cuff, EKG and other monitors will often be applied during the procedure.  Some patients may need to have extra oxygen administered for a short period. You will be asked to provide medical information, including your allergies, prior to the procedure.  We must know immediately if you are taking  blood thinners (like Coumadin/Warfarin)  Or if you are allergic to IV iodine contrast (dye). We must know if you could possible be pregnant.  Possible side-effects: Bleeding from needle site Infection (rare, may require surgery) Nerve injury (rare) Numbness & tingling (temporary) Difficulty urinating (rare, temporary) Spinal headache ( a headache worse with upright posture) Light -headedness (temporary) Pain at injection site (several days) Decreased blood pressure (temporary) Weakness in arm/leg (temporary) Pressure sensation in back/neck (temporary)  Call if you experience: Fever/chills associated with headache or increased back/neck pain. Headache worsened by an upright position. New onset weakness or numbness of an extremity below the injection site Hives or difficulty breathing (go to the emergency room) Inflammation or drainage at the infection site Severe back/neck pain Any new symptoms which are concerning to you  Please note:  Although the local anesthetic injected can often make your back or neck feel good for several hours after the injection, the pain will likely return.  It takes 3-7 days for steroids to work in the epidural space.  You may not notice any pain relief for at least that one week.  If effective, we will often do a series of three injections spaced 3-6 weeks apart to maximally decrease your pain.  After the initial series, we generally will wait several months before considering a repeat injection of the same type.  If you have any questions, please call [phone] [facility]

## 2022-09-26 NOTE — Progress Notes (Signed)
Safety precautions to be maintained throughout the outpatient stay will include: orient to surroundings, keep bed in low position, maintain call bell within reach at all times, provide assistance with transfer out of bed and ambulation.  

## 2022-09-26 NOTE — Telephone Encounter (Signed)
   Pre-operative Risk Assessment    Patient Name: Renee Boyd  DOB: 05-23-1951 MRN: [id number]      Request for Surgical Clearance    Procedure:  Cervical Epidural, neck pain  Date of Surgery:  Clearance TBD                                 Surgeon:  Gillis Santa, MD Surgeon's Group or Practice Name:  [facility] Phone number:  [phone] Fax number:  [phone]   Type of Clearance Requested:  Pharmacy, Eliquis    Type of Anesthesia:  Not Indicated   Additional requests/questions:    Signed, Maxwell Caul   09/26/2022, 12:25 PM

## 2022-10-08 NOTE — Telephone Encounter (Signed)
Dr. Caryl Comes, please review pharmacy recommendations and advise.  Thank you for your help.  Jossie Ng. Marquice Uddin NP-C     10/08/2022, 8:36 AM [place] [address] Office [phone] Fax [phone]

## 2022-10-09 ENCOUNTER — Encounter: Payer: Self-pay | Admitting: Internal Medicine

## 2022-10-10 ENCOUNTER — Other Ambulatory Visit: Payer: Self-pay

## 2022-10-10 DIAGNOSIS — E039 Hypothyroidism, unspecified: Secondary | ICD-10-CM

## 2022-10-10 MED ORDER — LEVOTHYROXINE SODIUM 25 MCG PO TABS: 25.0000 ug | ORAL_TABLET | Freq: Every day | ORAL | 1 refills | Status: DC

## 2022-10-29 ENCOUNTER — Ambulatory Visit (INDEPENDENT_AMBULATORY_CARE_PROVIDER_SITE_OTHER): Payer: Medicare Other

## 2022-10-29 DIAGNOSIS — I442 Atrioventricular block, complete: Secondary | ICD-10-CM

## 2022-10-29 LAB — CUP PACEART REMOTE DEVICE CHECK
Battery Remaining Longevity: 131 mo
Battery Voltage: 3.11 V
Brady Statistic AP VP Percent: 97.77 %
Brady Statistic AP VS Percent: 0.02 %
Brady Statistic AS VP Percent: 2.13 %
Brady Statistic AS VS Percent: 0.08 %
Brady Statistic RA Percent Paced: 97.38 %
Brady Statistic RV Percent Paced: 99.89 %
Date Time Interrogation Session: 20240326005233
Implantable Lead Connection Status: 753985
Implantable Lead Connection Status: 753985
Implantable Lead Implant Date: 20141217
Implantable Lead Implant Date: 20141217
Implantable Lead Location: 753859
Implantable Lead Location: 753860
Implantable Lead Model: 5076
Implantable Lead Model: 5076
Implantable Pulse Generator Implant Date: 20230926
Lead Channel Impedance Value: 361 Ohm
Lead Channel Impedance Value: 380 Ohm
Lead Channel Impedance Value: 456 Ohm
Lead Channel Impedance Value: 494 Ohm
Lead Channel Pacing Threshold Amplitude: 0.375 V
Lead Channel Pacing Threshold Amplitude: 0.75 V
Lead Channel Pacing Threshold Pulse Width: 0.4 ms
Lead Channel Pacing Threshold Pulse Width: 0.4 ms
Lead Channel Sensing Intrinsic Amplitude: 10.5 mV
Lead Channel Sensing Intrinsic Amplitude: 10.5 mV
Lead Channel Sensing Intrinsic Amplitude: 3.25 mV
Lead Channel Sensing Intrinsic Amplitude: 3.25 mV
Lead Channel Setting Pacing Amplitude: 1.5 V
Lead Channel Setting Pacing Amplitude: 2 V
Lead Channel Setting Pacing Pulse Width: 0.4 ms
Lead Channel Setting Sensing Sensitivity: 2 mV
Zone Setting Status: 755011

## 2022-10-31 ENCOUNTER — Other Ambulatory Visit: Payer: Self-pay | Admitting: Family Medicine

## 2022-10-31 ENCOUNTER — Other Ambulatory Visit: Payer: Self-pay

## 2022-10-31 DIAGNOSIS — R252 Cramp and spasm: Secondary | ICD-10-CM

## 2022-10-31 DIAGNOSIS — G8929 Other chronic pain: Secondary | ICD-10-CM

## 2022-10-31 DIAGNOSIS — M159 Polyosteoarthritis, unspecified: Secondary | ICD-10-CM

## 2022-10-31 MED ORDER — BACLOFEN 5 MG PO TABS: 5.0000 mg | ORAL_TABLET | Freq: Every day | ORAL | 3 refills | Status: DC

## 2022-10-31 NOTE — Telephone Encounter (Signed)
Requested medications are due for refill today.  yes  Requested medications are on the active medications list.  yes  Last refill. 07/11/2022 #60 2 rf  Future visit scheduled.   yes  Notes to clinic.  Refill not delegated.    Requested Prescriptions  Pending Prescriptions Disp Refills   traMADol (ULTRAM) 50 MG tablet [Pharmacy Med Name: TRAMADOL HCL 50 MG TAB] 60 tablet     Sig: TAKE 1 TABLET BY MOUTH TWICE DAILY     Not Delegated - Analgesics:  Opioid Agonists Failed - 10/31/2022  2:04 PM      Failed - This refill cannot be delegated      Failed - Urine Drug Screen completed in last 360 days      Failed - Valid encounter within last 3 months    Recent Outpatient Visits           4 months ago Type 2 diabetes mellitus with hyperlipidemia [facility])   [place], DO   7 months ago Chronic right shoulder pain   [place], DO   10 months ago Type 2 diabetes mellitus with hyperlipidemia [facility])   [facility] O[facility] Hauser, DO   1 year ago Benign hypertension with CKD (chronic kidney disease) stage IV [facility])   [facility], Devonne Doughty, DO   1 year ago Benign hypertension with CKD (chronic kidney disease) stage IV [facility])   [facility] [place], Devonne Doughty, DO       Future Appointments             In 1 month [place], Devonne Doughty, DO [facility]

## 2022-11-01 NOTE — Telephone Encounter (Signed)
Pt called to report that she is waiting for this Rx, states she is not completely out

## 2022-11-04 NOTE — Telephone Encounter (Signed)
Called and spoke with the patient.  Apologized profusely that that headset in my patient calls for 1 month (really ???) At this point her pain is manageable with twice daily tramadol.  She will continue that course and then in the event that she decides to proceed with epidural injection, I am okay with the 4-day hold given her renal function.  She has mostly been in sinus rhythm as detected by her device over the last year

## 2022-11-05 ENCOUNTER — Telehealth: Payer: Self-pay | Admitting: *Deleted

## 2022-11-05 NOTE — Telephone Encounter (Signed)
   Patient Name: Renee Boyd  DOB: [birth year] MRN: [id number]  Primary Cardiologist: None  Clinical pharmacists have reviewed the patient's past medical history, labs, and current medications as part of preoperative protocol coverage. The following recommendations have been made:  Per Dr. Caryl Comes patient is fine to hold Eliquis for 4 days prior to cervical ESI due to renal function.   I will route this recommendation to the requesting party via Epic fax function and remove from pre-op pool.  Please call with questions.  Mable Fill, Marissa Nestle, NP 11/05/2022, 7:30 AM

## 2022-11-05 NOTE — Telephone Encounter (Signed)
Pre-procedure instructions given to patient.

## 2022-11-07 ENCOUNTER — Other Ambulatory Visit: Payer: Self-pay | Admitting: Family Medicine

## 2022-11-07 ENCOUNTER — Telehealth: Payer: Self-pay | Admitting: *Deleted

## 2022-11-07 DIAGNOSIS — H2513 Age-related nuclear cataract, bilateral: Secondary | ICD-10-CM | POA: Diagnosis not present

## 2022-11-07 DIAGNOSIS — F5101 Primary insomnia: Secondary | ICD-10-CM

## 2022-11-07 DIAGNOSIS — H40003 Preglaucoma, unspecified, bilateral: Secondary | ICD-10-CM | POA: Diagnosis not present

## 2022-11-07 NOTE — Telephone Encounter (Signed)
Called patient to provide pre procedure instructions.

## 2022-11-07 NOTE — Telephone Encounter (Signed)
Requested medication (s) are due for refill today: yes  Requested medication (s) are on the active medication list: yes  Last refill:  08/08/22  Future visit scheduled: yes  Notes to clinic:  Unable to refill per protocol, cannot delegate.      Requested Prescriptions  Pending Prescriptions Disp Refills   temazepam (RESTORIL) 30 MG capsule [Pharmacy Med Name: TEMAZEPAM 30 MG CAP] 30 capsule     Sig: TAKE 1 CAPSULE BY MOUTH AT BEDTIME AS NEEDED SLEEP     Not Delegated - Psychiatry: Anxiolytics/Hypnotics 2 Failed - 11/07/2022 12:56 PM      Failed - This refill cannot be delegated      Failed - Urine Drug Screen completed in last 360 days      Passed - Patient is not pregnant      Passed - Valid encounter within last 6 months    Recent Outpatient Visits           4 months ago Type 2 diabetes mellitus with hyperlipidemia [facility])   [facility] [place], Devonne Doughty, DO   7 months ago Chronic right shoulder pain   [place], DO   10 months ago Type 2 diabetes mellitus with hyperlipidemia [facility])   [facility] Olin Hauser, DO   1 year ago Benign hypertension with CKD (chronic kidney disease) stage IV [facility])   [facility] [place], Devonne Doughty, DO   1 year ago Benign hypertension with CKD (chronic kidney disease) stage IV [facility])   [facility] [place], Devonne Doughty, DO       Future Appointments             In 1 month [place], Devonne Doughty, DO [facility]

## 2022-11-28 DIAGNOSIS — N184 Chronic kidney disease, stage 4 (severe): Secondary | ICD-10-CM | POA: Diagnosis not present

## 2022-11-28 DIAGNOSIS — E1122 Type 2 diabetes mellitus with diabetic chronic kidney disease: Secondary | ICD-10-CM | POA: Diagnosis not present

## 2022-12-02 ENCOUNTER — Encounter: Payer: Self-pay | Admitting: Student in an Organized Health Care Education/Training Program

## 2022-12-02 ENCOUNTER — Ambulatory Visit
Admission: RE | Admit: 2022-12-02 | Discharge: 2022-12-02 | Disposition: A | Discharge status: Home / Self Care | Payer: Medicare Other | Source: Ambulatory Visit | Attending: Student in an Organized Health Care Education/Training Program | Admitting: Student in an Organized Health Care Education/Training Program

## 2022-12-02 ENCOUNTER — Ambulatory Visit
Payer: Medicare Other | Attending: Student in an Organized Health Care Education/Training Program | Admitting: Student in an Organized Health Care Education/Training Program

## 2022-12-02 DIAGNOSIS — M5412 Radiculopathy, cervical region: Secondary | ICD-10-CM | POA: Insufficient documentation

## 2022-12-02 DIAGNOSIS — G894 Chronic pain syndrome: Secondary | ICD-10-CM | POA: Diagnosis not present

## 2022-12-02 DIAGNOSIS — M4802 Spinal stenosis, cervical region: Secondary | ICD-10-CM | POA: Insufficient documentation

## 2022-12-02 DIAGNOSIS — E1122 Type 2 diabetes mellitus with diabetic chronic kidney disease: Secondary | ICD-10-CM | POA: Diagnosis not present

## 2022-12-02 DIAGNOSIS — M792 Neuralgia and neuritis, unspecified: Secondary | ICD-10-CM | POA: Insufficient documentation

## 2022-12-02 DIAGNOSIS — N184 Chronic kidney disease, stage 4 (severe): Secondary | ICD-10-CM | POA: Diagnosis not present

## 2022-12-02 MED ORDER — IOHEXOL 180 MG/ML  SOLN
10.0000 mL | Freq: Once | INTRAMUSCULAR | Status: AC
  Administered 2022-12-02: 10 mL via EPIDURAL
  Filled 2022-12-02: qty 20

## 2022-12-02 MED ORDER — MIDAZOLAM HCL 2 MG/2ML IJ SOLN
0.5000 mg | Freq: Once | INTRAMUSCULAR | Status: AC
  Administered 2022-12-02: 2 mg via INTRAVENOUS
  Filled 2022-12-02: qty 2

## 2022-12-02 MED ORDER — SODIUM CHLORIDE 0.9% FLUSH
1.0000 mL | Freq: Once | INTRAVENOUS | Status: AC
  Administered 2022-12-02: 1 mL

## 2022-12-02 MED ORDER — SODIUM CHLORIDE (PF) 0.9 % IJ SOLN
INTRAMUSCULAR | Status: AC
  Filled 2022-12-02: qty 10

## 2022-12-02 MED ORDER — ROPIVACAINE HCL 2 MG/ML IJ SOLN
1.0000 mL | Freq: Once | INTRAMUSCULAR | Status: AC
  Administered 2022-12-02: 1 mL via EPIDURAL
  Filled 2022-12-02: qty 20

## 2022-12-02 MED ORDER — DEXAMETHASONE SODIUM PHOSPHATE 10 MG/ML IJ SOLN
10.0000 mg | Freq: Once | INTRAMUSCULAR | Status: AC
  Administered 2022-12-02: 10 mg
  Filled 2022-12-02: qty 1

## 2022-12-02 MED ORDER — LIDOCAINE HCL 2 % IJ SOLN
20.0000 mL | Freq: Once | INTRAMUSCULAR | Status: AC
  Administered 2022-12-02: 200 mg
  Filled 2022-12-02: qty 40

## 2022-12-02 MED ORDER — LACTATED RINGERS IV SOLN: Freq: Once | INTRAVENOUS | Status: AC

## 2022-12-02 NOTE — Progress Notes (Signed)
PROVIDER NOTE: Interpretation of information contained herein should be left to medically-trained personnel. Specific patient instructions are provided elsewhere under "Patient Instructions" section of medical record. This document was created in part using STT-dictation technology, any transcriptional errors that may result from this process are unintentional.  Patient: Renee Boyd Type: Established DOB: Sep 12, 1950 MRN: [id number] PCP: Smitty Cords, DO  Service: Procedure DOS: 12/02/2022 Setting: Ambulatory Location: Ambulatory outpatient facility Delivery: Face-to-face Provider: Edward Jolly, MD Specialty: Interventional Pain Management Specialty designation: 09 Location: Outpatient facility Ref. Prov.: Edward Jolly, MD       Interventional Therapy   Procedure: Cervical Epidural Steroid injection (CESI) (Interlaminar) #1  Laterality: Right  Level: C7-T1 Imaging: Fluoroscopy-assisted DOS: 12/02/2022  Performed by: Edward Jolly, MD Anesthesia: Local anesthesia (1-2% Lidocaine) Anxiolysis:IV Versed 2 mg Sedation: Minimal Sedation   Purpose: Diagnostic/Therapeutic Indications: Cervicalgia, cervical radicular pain, degenerative disc disease, severe enough to impact quality of life or function. 1. 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 Neuropathic pain, arm (right)   4. Chronic pain syndrome   5. Cervical radicular pain    NAS-11 score:   Pre-procedure: 7 /10   Post-procedure: 0-No pain/10      Position  Prep  Materials:  Location setting: Procedure suite Position: Prone, on modified reverse trendelenburg to facilitate breathing, with head in head-cradle. Pillows positioned under chest (below chin-level) with cervical spine flexed. Safety Precautions: Patient was assessed for positional comfort and pressure points before starting the procedure. Prepping solution:  DuraPrep (Iodine Povacrylex [0.7% available iodine] and Isopropyl Alcohol, 74% w/w) Prep Area: Entire  cervicothoracic region Approach: percutaneous, paramedial Intended target: Posterior cervical epidural space Materials Procedure:  Tray: Epidural Needle(s): Epidural (Tuohy) Qty: 1 Length: (90mm) 3.5-inch Gauge: 22G   Pre-op H&P Assessment:  Ms. Hayworth is a 72 y.o. (year old), female patient, seen today for interventional treatment. She  has a past surgical history that includes Abdominal hysterectomy; Cholecystectomy; Tonsillectomy; Tubal ligation; CARDIOVERSION (N/A, 05/28/2018); Cardioversion (N/A, 03/01/2020); Cardioversion (N/A, 10/25/2020); and PPM GENERATOR CHANGEOUT (N/A, 04/30/2022). Ms. Heydt has a current medication list which includes the following prescription(s): acetaminophen, allopurinol, amlodipine, ascorbic acid, atorvastatin, baclofen, combivent respimat, contour next test, desonide, dulaglutide, eliquis, furosemide, jardiance, levothyroxine, metoprolol succinate, multivitamin, omeprazole, temazepam, and tramadol, and the following Facility-Administered Medications: lactated ringers. Her primarily concern today is the Neck Pain  Initial Vital Signs:  Pulse/HCG Rate: 86ECG Heart Rate: 67 Temp: (!) 97.2 F (36.2 C) Resp: 16 BP: (!) 147/86 SpO2: 95 %  BMI: Estimated body mass index is 30.3 kg/m as calculated from the following:   Height as of this encounter: 4\' 11"  (1.499 m).   Weight as of this encounter: 150 lb (68 kg).  Risk Assessment: Allergies: Reviewed. She is allergic to morphine and related, neosporin [bacitracin-polymyxin b], other, and percocet [oxycodone-acetaminophen].  Allergy Precautions: None required Coagulopathies: Reviewed. None identified.  Blood-thinner therapy: None at this time Active Infection(s): Reviewed. None identified. Ms. Park is afebrile  Site Confirmation: Ms. [place]ia was asked to confirm the procedure and laterality before marking the  site Procedure checklist: Completed Consent: Before the procedure and under the influence of no sedative(s), amnesic(s), or anxiolytics, the patient was informed of the treatment options, risks and possible complications. To fulfill our ethical and legal obligations, as recommended by the American Medical Association's Code of Ethics, I have informed the patient of my clinical impression; the nature and purpose of  the treatment or procedure; the risks, benefits, and possible complications of the intervention; the alternatives, including doing nothing; the risk(s) and benefit(s) of the alternative treatment(s) or procedure(s); and the risk(s) and benefit(s) of doing nothing. The patient was provided information about the general risks and possible complications associated with the procedure. These may include, but are not limited to: failure to achieve desired goals, infection, bleeding, organ or nerve damage, allergic reactions, paralysis, and death. In addition, the patient was informed of those risks and complications associated to Spine-related procedures, such as failure to decrease pain; infection (i.e.: Meningitis, epidural or intraspinal abscess); bleeding (i.e.: epidural hematoma, subarachnoid hemorrhage, or any other type of intraspinal or peri-dural bleeding); organ or nerve damage (i.e.: Any type of peripheral nerve, nerve root, or spinal cord injury) with subsequent damage to sensory, motor, and/or autonomic systems, resulting in permanent pain, numbness, and/or weakness of one or several areas of the body; allergic reactions; (i.e.: anaphylactic reaction); and/or death. Furthermore, the patient was informed of those risks and complications associated with the medications. These include, but are not limited to: allergic reactions (i.e.: anaphylactic or anaphylactoid reaction(s)); adrenal axis suppression; blood sugar elevation that in diabetics may result in ketoacidosis or comma; water retention  that in patients with history of congestive heart failure may result in shortness of breath, pulmonary edema, and decompensation with resultant heart failure; weight gain; swelling or edema; medication-induced neural toxicity; particulate matter embolism and blood vessel occlusion with resultant organ, and/or nervous system infarction; and/or aseptic necrosis of one or more joints. Finally, the patient was informed that Medicine is not an exact science; therefore, there is also the possibility of unforeseen or unpredictable risks and/or possible complications that may result in a catastrophic outcome. The patient indicated having understood very clearly. We have given the patient no guarantees and we have made no promises. Enough time was given to the patient to ask questions, all of which were answered to the patient's satisfaction. Ms. Russman has indicated that she wanted to continue with the procedure. Attestation: I, the ordering provider, attest that I have discussed with the patient the benefits, risks, side-effects, alternatives, likelihood of achieving goals, and potential problems during recovery for the procedure that I have provided informed consent. Date  Time: 12/02/2022 11:15 AM   Pre-Procedure Preparation:  Monitoring: As per clinic protocol. Respiration, ETCO2, SpO2, BP, heart rate and rhythm monitor placed and checked for adequate function Safety Precautions: Patient was assessed for positional comfort and pressure points before starting the procedure. Time-out: I initiated and conducted the "Time-out" before starting the procedure, as per protocol. The patient was asked to participate by confirming the accuracy of the "Time Out" information. Verification of the correct person, site, and procedure were performed and confirmed by me, the nursing staff, and the patient. "Time-out" conducted as per Joint Commission's Universal Protocol (UP.01.01.01). Time: 1211 Start Time: 1211  hrs.  Description  Narrative of Procedure:          Rationale (medical necessity): procedure needed and proper for the diagnosis and/or treatment of the patient's medical symptoms and needs. Start Time: 1211 hrs. Safety Precautions: Aspiration looking for blood return was conducted prior to all injections. At no point did we inject any substances, as a needle was being advanced. No attempts were made at seeking any paresthesias. Safe injection practices and needle disposal techniques used. Medications properly checked for expiration dates. SDV (single dose vial) medications used. Description of procedure: Protocol guidelines were followed. The patient was assisted  into a comfortable position. The target area was identified and the area prepped in the usual manner. Skin & deeper tissues infiltrated with local anesthetic. Appropriate amount of time allowed to pass for local anesthetics to take effect. Using fluoroscopic guidance, the epidural needle was introduced through the skin, ipsilateral to the reported pain, and advanced to the target area. Posterior laminar os was contacted and the needle walked caudad, until the lamina was cleared. The ligamentum flavum was engaged and the epidural space identified using "loss-of-resistance technique" with 2-3 ml of PF-NaCl (0.9% NSS), in a 5cc dedicated LOR syringe. (See "Imaging guidance" below for use of contrast details.) Once proper needle placement was secured, and negative aspiration confirmed, the solution was injected in intermittent fashion, asking for systemic symptoms every 0.5cc. The needles were then removed and the area cleansed, making sure to leave some of the prepping solution back to take advantage of its long term bactericidal properties.  3 cc solution made of1 cc of preservative-free saline, 1 cc of 0.2% ropivacaine, 1 cc of Decadron 10 mg/cc.   Vitals:   12/02/22 1216 12/02/22 1219 12/02/22 1223 12/02/22 1226  BP: (!) 155/94 135/72 91/73  (!) 140/76  Pulse:      Resp: 19 16 16 16   Temp:   (!) 97.2 F (36.2 C)   SpO2: 91% 99% 95% 100%  Weight:      Height:         End Time: 1217 hrs.  Imaging Guidance (Spinal):          Type of Imaging Technique: Fluoroscopy Guidance (Spinal) Indication(s): Assistance in needle guidance and placement for procedures requiring needle placement in or near specific anatomical locations not easily accessible without such assistance. Exposure Time: Please see nurses notes. Contrast: Before injecting any contrast, we confirmed that the patient did not have an allergy to iodine, shellfish, or radiological contrast. Once satisfactory needle placement was completed at the desired level, radiological contrast was injected. Contrast injected under live fluoroscopy. No contrast complications. See chart for type and volume of contrast used. Fluoroscopic Guidance: I was personally present during the use of fluoroscopy. "Tunnel Vision Technique" used to obtain the best possible view of the target area. Parallax error corrected before commencing the procedure. "Direction-depth-direction" technique used to introduce the needle under continuous pulsed fluoroscopy. Once target was reached, antero-posterior, oblique, and lateral fluoroscopic projection used confirm needle placement in all planes. Images permanently stored in EMR. Interpretation: I personally interpreted the imaging intraoperatively. Adequate needle placement confirmed in multiple planes. Appropriate spread of contrast into desired area was observed. No evidence of afferent or efferent intravascular uptake. No intrathecal or subarachnoid spread observed. Permanent images saved into the patient's record.  Post-operative Assessment:  Post-procedure Vital Signs:  Pulse/HCG Rate: 8662 Temp: (!) 97.2 F (36.2 C) Resp: 16 BP: (!) 140/76 SpO2: 100 %  EBL: None  Complications: No immediate post-treatment complications observed by team, or reported by  patient.  Note: The patient tolerated the entire procedure well. A repeat set of vitals were taken after the procedure and the patient was kept under observation following institutional policy, for this type of procedure. Post-procedural neurological assessment was performed, showing return to baseline, prior to discharge. The patient was provided with post-procedure discharge instructions, including a section on how to identify potential problems. Should any problems arise concerning this procedure, the patient was given instructions to immediately contact us, at any time, without hesitation. In any case, we plan to contact the patient by telephone  for a follow-up status report regarding this interventional procedure.  Comments:  No additional relevant information.  Plan of Care (POC)  Orders:  Orders Placed This Encounter  Procedures   DG PAIN CLINIC C-ARM 1-60 MIN NO REPORT    Intraoperative interpretation by procedural physician at [facility].    Standing Status:   Standing    Number of Occurrences:   1    Order Specific Question:   Reason for exam:    Answer:   Assistance in needle guidance and placement for procedures requiring needle placement in or near specific anatomical locations not easily accessible without such assistance.     Medications ordered for procedure: Meds ordered this encounter  Medications   iohexol (OMNIPAQUE) 180 MG/ML injection 10 mL    Must be Myelogram-compatible. If not available, you may substitute with a water-soluble, non-ionic, hypoallergenic, myelogram-compatible radiological contrast medium.   lidocaine (XYLOCAINE) 2 % (with pres) injection 400 mg   lactated ringers infusion   midazolam (VERSED) injection 0.5-2 mg    Make sure Flumazenil is available in the pyxis when using this medication. If oversedation occurs, administer 0.2 mg IV over 15 sec. If after 45 sec no response, administer 0.2 mg again over 1 min; may repeat at 1 min  intervals; not to exceed 4 doses (1 mg)   ropivacaine (PF) 2 mg/mL (0.2%) (NAROPIN) injection 1 mL   sodium chloride flush (NS) 0.9 % injection 1 mL   dexamethasone (DECADRON) injection 10 mg   Medications administered: We administered iohexol, lidocaine, lactated ringers, midazolam, ropivacaine (PF) 2 mg/mL (0.2%), sodium chloride flush, and dexamethasone.  See the medical record for exact dosing, route, and time of administration.  Follow-up plan:   Return in about 4 weeks (around 12/30/2022), or PPE, F2F.       R C7/T1 ESI 12/02/22    Recent Visits Date Type Provider Dept  09/26/22 Office Visit Edward Jolly, MD [facility]  Showing recent visits within past 90 days and meeting all other requirements Today's Visits Date Type Provider Dept  12/02/22 Procedure visit Edward Jolly, MD [facility]  Showing today's visits and meeting all other requirements Future Appointments Date Type Provider Dept  01/06/23 Appointment Edward Jolly, MD [facility]  Showing future appointments within next 90 days and meeting all other requirements  Disposition: Discharge home  Discharge (Date  Time): 12/02/2022; 1230 hrs.   Primary Care Physician: Smitty Cords, DO Location: [facility] Note by: Edward Jolly, MD (TTS technology used. I apologize for any typographical errors that were not detected and corrected.) Date: 12/02/2022; Time: 1:07 PM  Disclaimer:  Medicine is not an Visual merchandiser. The only guarantee in medicine is that nothing is guaranteed. It is important to note that the decision to proceed with this intervention was based on the information collected from the patient. The Data and conclusions were drawn from the patient's questionnaire, the interview, and the physical examination. Because the information was provided in large part by the patient, it cannot be guaranteed that it has not been purposely or  unconsciously manipulated. Every effort has been made to obtain as much relevant data as possible for this evaluation. It is important to note that the conclusions that lead to this procedure are derived in large part from the available data. Always take into account that the treatment will also be dependent on availability of resources and existing treatment guidelines, considered by other Pain Management Practitioners as being  common knowledge and practice, at the time of the intervention. For Medico-Legal purposes, it is also important to point out that variation in procedural techniques and pharmacological choices are the acceptable norm. The indications, contraindications, technique, and results of the above procedure should only be interpreted and judged by a Board-Certified Interventional Pain Specialist with extensive familiarity and expertise in the same exact procedure and technique.

## 2022-12-02 NOTE — Progress Notes (Signed)
Safety precautions to be maintained throughout the outpatient stay will include: orient to surroundings, keep bed in low position, maintain call bell within reach at all times, provide assistance with transfer out of bed and ambulation.  

## 2022-12-02 NOTE — Patient Instructions (Signed)

## 2022-12-03 ENCOUNTER — Telehealth: Payer: Self-pay | Admitting: Student in an Organized Health Care Education/Training Program

## 2022-12-03 ENCOUNTER — Telehealth: Payer: Self-pay | Admitting: *Deleted

## 2022-12-03 NOTE — Telephone Encounter (Signed)
PT return call back to office from ppe done on yesterday. PT stated that she is doing well.

## 2022-12-03 NOTE — Telephone Encounter (Signed)
Attempted to call for post procedure follow-up. Message left. 

## 2022-12-03 NOTE — Telephone Encounter (Signed)
Pt called back concerning her procedure on yesterday. No problems reported.

## 2022-12-05 DIAGNOSIS — E1122 Type 2 diabetes mellitus with diabetic chronic kidney disease: Secondary | ICD-10-CM | POA: Diagnosis not present

## 2022-12-05 DIAGNOSIS — N184 Chronic kidney disease, stage 4 (severe): Secondary | ICD-10-CM | POA: Diagnosis not present

## 2022-12-05 DIAGNOSIS — E1159 Type 2 diabetes mellitus with other circulatory complications: Secondary | ICD-10-CM | POA: Diagnosis not present

## 2022-12-05 DIAGNOSIS — I10 Essential (primary) hypertension: Secondary | ICD-10-CM | POA: Diagnosis not present

## 2022-12-05 DIAGNOSIS — N1832 Chronic kidney disease, stage 3b: Secondary | ICD-10-CM | POA: Diagnosis not present

## 2022-12-10 NOTE — Progress Notes (Signed)
Remote pacemaker transmission.   

## 2022-12-18 DIAGNOSIS — N1832 Chronic kidney disease, stage 3b: Secondary | ICD-10-CM | POA: Diagnosis not present

## 2022-12-18 DIAGNOSIS — E1122 Type 2 diabetes mellitus with diabetic chronic kidney disease: Secondary | ICD-10-CM | POA: Diagnosis not present

## 2022-12-25 ENCOUNTER — Ambulatory Visit: Payer: Medicare Other | Admitting: Family Medicine

## 2022-12-25 DIAGNOSIS — N184 Chronic kidney disease, stage 4 (severe): Secondary | ICD-10-CM | POA: Diagnosis not present

## 2022-12-25 DIAGNOSIS — N2581 Secondary hyperparathyroidism of renal origin: Secondary | ICD-10-CM | POA: Diagnosis not present

## 2022-12-25 DIAGNOSIS — E875 Hyperkalemia: Secondary | ICD-10-CM | POA: Diagnosis not present

## 2022-12-25 DIAGNOSIS — R809 Proteinuria, unspecified: Secondary | ICD-10-CM | POA: Diagnosis not present

## 2022-12-25 DIAGNOSIS — I129 Hypertensive chronic kidney disease with stage 1 through stage 4 chronic kidney disease, or unspecified chronic kidney disease: Secondary | ICD-10-CM | POA: Diagnosis not present

## 2022-12-25 DIAGNOSIS — E1122 Type 2 diabetes mellitus with diabetic chronic kidney disease: Secondary | ICD-10-CM | POA: Diagnosis not present

## 2022-12-26 ENCOUNTER — Other Ambulatory Visit: Payer: Self-pay | Admitting: Nephrology

## 2022-12-26 DIAGNOSIS — N2581 Secondary hyperparathyroidism of renal origin: Secondary | ICD-10-CM

## 2022-12-26 DIAGNOSIS — E875 Hyperkalemia: Secondary | ICD-10-CM

## 2022-12-31 ENCOUNTER — Ambulatory Visit (INDEPENDENT_AMBULATORY_CARE_PROVIDER_SITE_OTHER): Payer: Medicare Other | Admitting: Family Medicine

## 2022-12-31 ENCOUNTER — Other Ambulatory Visit: Payer: Self-pay | Admitting: Family Medicine

## 2022-12-31 ENCOUNTER — Encounter: Payer: Self-pay | Admitting: Family Medicine

## 2022-12-31 VITALS — BP 132/72 | HR 70 | Ht 59.0 in | Wt 150.0 lb

## 2022-12-31 DIAGNOSIS — M159 Polyosteoarthritis, unspecified: Secondary | ICD-10-CM

## 2022-12-31 DIAGNOSIS — M150 Primary generalized (osteo)arthritis: Secondary | ICD-10-CM

## 2022-12-31 DIAGNOSIS — M5442 Lumbago with sciatica, left side: Secondary | ICD-10-CM

## 2022-12-31 DIAGNOSIS — M25511 Pain in right shoulder: Secondary | ICD-10-CM | POA: Diagnosis not present

## 2022-12-31 DIAGNOSIS — G8929 Other chronic pain: Secondary | ICD-10-CM

## 2022-12-31 DIAGNOSIS — L209 Atopic dermatitis, unspecified: Secondary | ICD-10-CM

## 2022-12-31 MED ORDER — TRAMADOL HCL 50 MG PO TABS: 50.0000 mg | ORAL_TABLET | Freq: Three times a day (TID) | ORAL | 2 refills | Status: DC

## 2022-12-31 NOTE — Patient Instructions (Addendum)
Thank you for coming to the office today.  Dose increase Tramadol from twice a day up to 3 times a day. 50mg  per pill 3 times a day.  Keep up with Dr Cherylann Ratel for pain management.  Keep on improved hydration.  Please schedule a Follow-up Appointment to: Return in about 6 weeks (around 02/11/2023) for 6 week follow-up Pain Management.  If you have any other questions or concerns, please feel free to call the office or send a message through MyChart. You may also schedule an earlier appointment if necessary.  Additionally, you may be receiving a survey about your experience at our office within a few days to 1 week by e-mail or mail. We value your feedback.  Saralyn Pilar, DO [facility], [place]

## 2022-12-31 NOTE — Progress Notes (Signed)
Subjective:    Patient ID: Renee Boyd, female    DOB: September 26, 1950, 72 y.o.   MRN: [id number]  Renee Boyd is a 72 y.o. female presenting on 12/31/2022 for Medical Management of Chronic Issues   HPI  Chronic Right Shoulder and Neck Pain Right Rotator Cuff Tear Osteoarthritis, multiple joints Back Pain / Hip Pain / Shoulder pain Chronic problem. Episodic painful flares of joint pain, worse with inc activity and storms and weather changes Tramadol PRN Cannot take NSAIDs due to CKD. On Gabapentin 300mg  daily doing well. Using baclofen 5mg  only max dose nightly She wakes up with pain worse at night R shoulder, has flares difficulty raising arm.  Recent updates 09/2022 MRI R Shoulder shows full thickness R rotator cuff tear, see below. She has had issue with R arm shoulder flaring up, and then she has used her Left arm more often and depending on this. Her symptoms mostly now flaring with Neck symptoms  She has upcoming series of injections from Dr Cherylann Ratel at [facility], has received one recently and has 2 more upcoming  Interested to continue Tramadol with dose increase.     12/31/2022    8:59 AM 09/26/2022    9:25 AM 08/14/2022    8:11 AM  Depression screen PHQ 2/9  Decreased Interest 0 0 0  Down, Depressed, Hopeless 0 0 0  PHQ - 2 Score 0 0 0    Social History   Tobacco Use   Smoking status: Former    Packs/day: 2.00    Years: 40.00    Additional pack years: 0.00    Total pack years: 80.00    Types: Cigarettes    Quit date: 06/27/2005    Years since quitting: 17.5   Smokeless tobacco: Former   Tobacco comments:    quit Jun 27 2005  Vaping Use   Vaping Use: Never used  Substance Use Topics   Alcohol use: No   Drug use: No    Review of Systems Per HPI unless specifically indicated above     Objective:    BP 132/72   Pulse 70   Ht 4\' 11"  (1.499 m)   Wt 150 lb (68 kg)   SpO2 99%   BMI 30.30 kg/m   Wt Readings from Last 3 Encounters:  12/31/22 150 lb  (68 kg)  12/02/22 150 lb (68 kg)  09/26/22 149 lb (67.6 kg)    Physical Exam Vitals and nursing note reviewed.  Constitutional:      General: She is not in acute distress.    Appearance: She is well-developed. She is not diaphoretic.     Comments: Well-appearing, comfortable, cooperative  HENT:     Head: Normocephalic and atraumatic.  Eyes:     General:        Right eye: No discharge.        Left eye: No discharge.     Conjunctiva/sclera: Conjunctivae normal.  Neck:     Thyroid: No thyromegaly.  Cardiovascular:     Rate and Rhythm: Normal rate and regular rhythm.     Heart sounds: Normal heart sounds. No murmur heard. Pulmonary:     Effort: Pulmonary effort is normal. No respiratory distress.     Breath sounds: Normal breath sounds. No wheezing or rales.  Musculoskeletal:     Cervical back: Normal range of motion and neck supple.     Comments: Bilateral shoulders reduced range of motion over head. Forward flex / abduction. Localized pain bilateral  cervical spine paraspinal muscles. Some hypertonicity  Lymphadenopathy:     Cervical: No cervical adenopathy.  Skin:    General: Skin is warm and dry.     Findings: No erythema or rash.  Neurological:     Mental Status: She is alert and oriented to person, place, and time.  Psychiatric:        Behavior: Behavior normal.     Comments: Well groomed, good eye contact, normal speech and thoughts     CLINICAL DATA:  Shoulder pain, chronic, erosive osteoarthritis suspected, xray done Shoulder pain, chronic, bursitis suspected, xray done   EXAM: MRI OF THE RIGHT SHOULDER WITHOUT CONTRAST   TECHNIQUE: Multiplanar, multisequence MR imaging of the shoulder was performed. No intravenous contrast was administered.   COMPARISON:  None Available.   FINDINGS: Motion degraded exam with suboptimal, large field of view.   Rotator cuff: There is a high-grade, essentially full-thickness, partial width tear of the posterior  supraspinatus and anterior superior infraspinatus tendon with up to 1.8 cm retraction. Tear measures approximately 1.9 cm anterior-posterior. Teres minor tendon is intact. Subscapularis tendon is intact.   Muscles: Streaky low-grade supraspinatus and infraspinatus atrophy and intramuscular edema.   Biceps Long Head: Intraarticular and extraarticular portions of the biceps tendon are intact.   Acromioclavicular Joint: Moderate arthropathy of the acromioclavicular joint. Mild subacromial/subdeltoid bursal fluid .   Glenohumeral Joint: Small joint effusion. Mild to moderate chondrosis.   Labrum: Grossly intact, but evaluation is limited by lack of intraarticular fluid/contrast.   Bones: No evidence of acute fracture.  No aggressive osseous lesion.   Other: No focal fluid collection.   IMPRESSION: High-grade, essentially full-thickness, partial width tear of the posterior supraspinatus and anterior superior infraspinatus tendon with up to 1.8 cm retraction.   Mild-to-moderate glenohumeral and moderate AC joint osteoarthritis.   Small joint effusion.     Electronically Signed   By: Caprice Renshaw M.D.   On: 09/17/2022 16:46  Results for orders placed or performed in visit on 10/29/22  CUP PACEART REMOTE DEVICE CHECK  Result Value Ref Range   Date Time Interrogation Session [id number]    Pulse Generator Manufacturer MERM    Pulse Gen Model [id number] Azure XT DR MRI    Pulse Gen Serial Number [id number]    Clinic Name [facility]    Implantable Pulse Generator Type Implantable Pulse Generator    Implantable Pulse Generator Implant Date [id number]    Implantable Lead Manufacturer MERM    Implantable Lead Model 5076 CapSureFix Novus MRI SureScan    Implantable Lead Serial Number [id number]    Implantable Lead Implant Date [id number]    Implantable Lead Location Detail 1 UNKNOWN    Implantable Lead Location [id number]    Implantable Lead Connection Status [id number]    Implantable  Lead Manufacturer MERM    Implantable Lead Model 5076 CapSureFix Novus MRI SureScan    Implantable Lead Serial Number [id number]    Implantable Lead Implant Date [id number]    Implantable Lead Location Detail 1 UNKNOWN    Implantable Lead Location [id number]    Implantable Lead Connection Status [id number]    Lead Channel Setting Sensing Sensitivity 2 mV   Lead Channel Setting Pacing Amplitude 1.5 V   Lead Channel Setting Pacing Pulse Width 0.4 ms   Lead Channel Setting Pacing Amplitude 2 V   Zone Setting Status [id number]    Zone Setting Status Active    Lead Channel Impedance Value 380 ohm   Lead Channel Impedance Value 361 ohm  Lead Channel Sensing Intrinsic Amplitude 3.25 mV   Lead Channel Sensing Intrinsic Amplitude 3.25 mV   Lead Channel Pacing Threshold Amplitude 0.375 V   Lead Channel Pacing Threshold Pulse Width 0.4 ms   Lead Channel Impedance Value 494 ohm   Lead Channel Impedance Value 456 ohm   Lead Channel Sensing Intrinsic Amplitude 10.5 mV   Lead Channel Sensing Intrinsic Amplitude 10.5 mV   Lead Channel Pacing Threshold Amplitude 0.75 V   Lead Channel Pacing Threshold Pulse Width 0.4 ms   Battery Status OK    Battery Remaining Longevity 131 mo   Battery Voltage 3.11 V   Brady Statistic RA Percent Paced 97.38 %   Brady Statistic RV Percent Paced 99.89 %   Brady Statistic AP VP Percent 97.77 %   Brady Statistic AS VP Percent 2.13 %   Brady Statistic AP VS Percent 0.02 %   Brady Statistic AS VS Percent 0.08 %      Assessment & Plan:   Problem List Items Addressed This Visit     Chronic left-sided low back pain with left-sided sciatica   Relevant Medications   traMADol (ULTRAM) 50 MG tablet   Chronic right shoulder pain - Primary   Relevant Medications   traMADol (ULTRAM) 50 MG tablet   Other Visit Diagnoses     Primary osteoarthritis involving multiple joints       Relevant Medications   traMADol (ULTRAM) 50 MG tablet       R Shoulder full thickness rotator cuff  tear  Dose increase Tramadol from twice a day up to 3 times a day. 50mg  per pill 3 times a day. (Note CKD GFR improving, >40, okay to keep under max dose 200mg  per 24 hours)  Keep up with Dr Cherylann Ratel for pain management. May warrant future Orthopedic management next if interested to pursue surgical intervention.  Keep on improved hydration.  Meds ordered this encounter  Medications   traMADol (ULTRAM) 50 MG tablet    Sig: Take 1 tablet (50 mg total) by mouth 3 (three) times daily.    Dispense:  90 tablet    Refill:  2    Frequency increase      Follow up plan: Return in about 6 weeks (around 02/11/2023) for 6 week follow-up Pain Management.    Saralyn Pilar, DO [facility] 12/31/2022, 9:04 AM

## 2022-12-31 NOTE — Telephone Encounter (Signed)
Medication Refill - Medication: desonide (DESOWEN) 0.05 % lotion [[id number]]   Has the patient contacted their pharmacy? Yes.     (Agent: If yes, when and what did the pharmacy advise?) Contact PCP    Preferred Pharmacy (with phone number or street name): [facility] - [place], [place] - [address]   Has the patient been seen for an appointment in the last year OR does the patient have an upcoming appointment? Yes.    Agent: Please be advised that RX refills may take up to 3 business days. We ask that you follow-up with your pharmacy.   Pt is requesting a callback when the lotion has been sent.

## 2023-01-01 ENCOUNTER — Other Ambulatory Visit: Payer: Self-pay | Admitting: Family Medicine

## 2023-01-01 DIAGNOSIS — L209 Atopic dermatitis, unspecified: Secondary | ICD-10-CM

## 2023-01-01 MED ORDER — DESONIDE 0.05 % EX LOTN: TOPICAL_LOTION | Freq: Two times a day (BID) | CUTANEOUS | 2 refills | Status: DC

## 2023-01-01 NOTE — Telephone Encounter (Signed)
Requested medication (s) are due for refill today: yes  Requested medication (s) are on the active medication list: yes  Last refill:  01/24/22 #62mL/0  Future visit scheduled: yes  Notes to clinic:  Unable to refill per protocol, cannot delegate.     Requested Prescriptions  Pending Prescriptions Disp Refills   desonide (DESOWEN) 0.05 % lotion 59 mL 0    Sig: Apply topically 2 (two) times daily.     Not Delegated - Dermatology:  Corticosteroids Failed - 01/01/2023 12:21 PM      Failed - This refill cannot be delegated      Passed - Valid encounter within last 12 months    Recent Outpatient Visits           Yesterday Chronic right shoulder pain   [place] [facility] [place], Netta Neat, DO   6 months ago Type 2 diabetes mellitus with hyperlipidemia [facility])   [place] [facility] Smitty Cords, DO   9 months ago Chronic right shoulder pain   [place] [facility] Smitty Cords, DO   1 year ago Type 2 diabetes mellitus with hyperlipidemia [facility])   [place] [facility] Smitty Cords, DO   1 year ago Benign hypertension with CKD (chronic kidney disease) stage IV [facility])   [place] [facility] [place], Netta Neat, DO       Future Appointments             In 1 month Althea Charon, Netta Neat, DO [place] [facility]

## 2023-01-01 NOTE — Telephone Encounter (Signed)
Requested medication (s) are due for refill today: yes  Requested medication (s) are on the active medication list: yes  Last refill:  6/22/253 59 ml  Future visit scheduled: no  Notes to clinic:  med not delegated to NT to RF   Requested Prescriptions  Pending Prescriptions Disp Refills   desonide (DESOWEN) 0.05 % lotion 59 mL 0    Sig: Apply topically 2 (two) times daily.     Not Delegated - Dermatology:  Corticosteroids Failed - 12/31/2022  2:31 PM      Failed - This refill cannot be delegated      Passed - Valid encounter within last 12 months    Recent Outpatient Visits           Yesterday Chronic right shoulder pain   [place] [place], Netta Neat, DO   6 months ago Type 2 diabetes mellitus with hyperlipidemia [facility])   [place] [facility] Smitty Cords, DO   9 months ago Chronic right shoulder pain   [place] [facility] Smitty Cords, DO   1 year ago Type 2 diabetes mellitus with hyperlipidemia [facility])   [place] [facility] Smitty Cords, DO   1 year ago Benign hypertension with CKD (chronic kidney disease) stage IV [facility])   [place] [facility] [place], Netta Neat, DO       Future Appointments             In 1 month Althea Charon, Netta Neat, DO [place], [facility]

## 2023-01-01 NOTE — Telephone Encounter (Signed)
Medication Refill - Medication: desonide (DESOWEN) 0.05 % lotion   Has the patient contacted their pharmacy? No.  Preferred Pharmacy (with phone number or street name):  [facility] - [place], [place] - [address] Phone: [phone]  Fax: [phone]     Has the patient been seen for an appointment in the last year OR does the patient have an upcoming appointment? Yes.    Agent: Please be advised that RX refills may take up to 3 business days. We ask that you follow-up with your pharmacy.

## 2023-01-06 ENCOUNTER — Encounter: Payer: Self-pay | Admitting: Student in an Organized Health Care Education/Training Program

## 2023-01-06 ENCOUNTER — Ambulatory Visit
Payer: Medicare Other | Attending: Student in an Organized Health Care Education/Training Program | Admitting: Student in an Organized Health Care Education/Training Program

## 2023-01-06 VITALS — BP 134/64 | HR 61 | Temp 97.2°F | Resp 17 | Ht 59.0 in | Wt 150.0 lb

## 2023-01-06 DIAGNOSIS — G894 Chronic pain syndrome: Secondary | ICD-10-CM | POA: Diagnosis not present

## 2023-01-06 DIAGNOSIS — M4802 Spinal stenosis, cervical region: Secondary | ICD-10-CM

## 2023-01-06 DIAGNOSIS — M5412 Radiculopathy, cervical region: Secondary | ICD-10-CM

## 2023-01-06 NOTE — Progress Notes (Signed)
Safety precautions to be maintained throughout the outpatient stay will include: orient to surroundings, keep bed in low position, maintain call bell within reach at all times, provide assistance with transfer out of bed and ambulation.  

## 2023-01-06 NOTE — Progress Notes (Signed)
PROVIDER NOTE: Information contained herein reflects review and annotations entered in association with encounter. Interpretation of such information and data should be left to medically-trained personnel. Information provided to patient can be located elsewhere in the medical record under "Patient Instructions". Document created using STT-dictation technology, any transcriptional errors that may result from process are unintentional.    Patient: Renee Boyd  Service Category: E/M  Provider: Edward Jolly, MD  DOB: 1950-12-23  DOS: 01/06/2023  Referring Provider: Saralyn Pilar *  MRN: [id number]  Specialty: Interventional Pain Management  PCP: Smitty Cords, DO  Type: Established Patient  Setting: Ambulatory outpatient    Location: Office  Delivery: Face-to-face     HPI  Renee Boyd, a 72 y.o. year old female, is here today because of her Cervical stenosis of spinal canal [M48.02]. Ms. Schrage primary complain today is Neck Pain  Pain Assessment: Severity of Chronic pain is reported as a 5 /10. Location: Neck Right, Left/at times radiates to forearms bilat. Onset: More than a month ago. Quality: Aching, Nagging, Sore. Timing: Constant. Modifying factor(s): procedure, rest. Vitals:  height is 4\' 11"  (1.499 m) and weight is 150 lb (68 kg). Her temporal temperature is 97.2 F (36.2 C) (abnormal). Her blood pressure is 134/64 and her pulse is 61. Her respiration is 17 and oxygen saturation is 97%.  BMI: Estimated body mass index is 30.3 kg/m as calculated from the following:   Height as of this encounter: 4\' 11"  (1.499 m).   Weight as of this encounter: 150 lb (68 kg). Last encounter: 09/26/2022. Last procedure: 12/02/2022.  Reason for encounter: post-procedure evaluation and assessment.   Patient is doing well after her cervical epidural steroid injection and states that her right-sided arm and forearm pain is significantly better.  She is complaining of left sided pain that  is radiating down her left bicep and forearm.  She is interested in repeating the injection for her left side.   Post-procedure evaluation   Procedure: Cervical Epidural Steroid injection (CESI) (Interlaminar) #1  Laterality: Right  Level: C7-T1 Imaging: Fluoroscopy-assisted DOS: 12/02/2022  Performed by: Edward Jolly, MD Anesthesia: Local anesthesia (1-2% Lidocaine) Anxiolysis:IV Versed 2 mg Sedation: Minimal Sedation   Purpose: Diagnostic/Therapeutic Indications: Cervicalgia, cervical radicular pain, degenerative disc disease, severe enough to impact quality of life or function. 1. 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 Neuropathic pain, arm (right)   4. Chronic pain syndrome   5. Cervical radicular pain    NAS-11 score:   Pre-procedure: 7 /10   Post-procedure: 0-No pain/10      Effectiveness:  Initial hour after procedure: 70 %  Subsequent 4-6 hours post-procedure: 50 %  Analgesia past initial 6 hours: 60% Ongoing improvement:  Analgesic:  60-65% Function: Somewhat improved for the right side ROM: Somewhat improved for the ride side    ROS  Constitutional: Denies any fever or chills Gastrointestinal: No reported hemesis, hematochezia, vomiting, or acute GI distress Musculoskeletal:  left neck, bicep and left forearm pain Neurological: No reported episodes of acute onset apraxia, aphasia, dysarthria, agnosia, amnesia, paralysis, loss of coordination, or loss of consciousness  Medication Review  Baclofen, Dulaglutide, Ipratropium-Albuterol, acetaminophen, allopurinol, amLODipine, apixaban, ascorbic acid, atorvastatin, desonide, empagliflozin, furosemide, glucose blood, levothyroxine, metoprolol succinate, multivitamin, omeprazole, temazepam, and traMADol  History Review  Allergy: Renee Boyd is allergic to morphine and codeine, neosporin [bacitracin-polymyxin b], other, and  percocet [oxycodone-acetaminophen]. Drug:  [name]  reports no history of drug use. Alcohol:  reports no history of alcohol use. Tobacco:  reports that she quit smoking about 17 years ago. Her smoking use included cigarettes. She has a 80.00 pack-year smoking history. She has quit using smokeless tobacco. Social: Renee Boyd  reports that she quit smoking about 17 years ago. Her smoking use included cigarettes. She has a 80.00 pack-year smoking history. She has quit using smokeless tobacco. She reports that she does not drink alcohol and does not use drugs. Medical:  has a past medical history of Complete heart block (HCC), Diabetes (HCC), GERD (gastroesophageal reflux disease), Hypertension, Hypothyroid, Pacemaker, Persistent atrial fibrillation (HCC), Pulmonary hypertension (HCC), and TIA (transient ischemic attack). Surgical: Renee Boyd  has a past surgical history that includes Abdominal hysterectomy; Cholecystectomy; Tonsillectomy; Tubal ligation; CARDIOVERSION (N/A, 05/28/2018); Cardioversion (N/A, 03/01/2020); Cardioversion (N/A, 10/25/2020); and PPM GENERATOR CHANGEOUT (N/A, 04/30/2022). Family: family history includes Alcohol abuse in her father; Autism in her son; Cancer in her son; Heart disease in her maternal grandmother and mother; Hypertension in an other family member; Miscarriages / [place] in her maternal aunt.  Laboratory Chemistry Profile   Renal Lab Results  Component Value Date   BUN 22 06/19/2022   CREATININE 1.59 (H) 06/19/2022   BCR 14 06/19/2022   GFRAA 23 (L) 02/25/2020   GFRNONAA 20 (L) 02/25/2020    Hepatic Lab Results  Component Value Date   AST 24 06/19/2022   ALT 21 06/19/2022   ALBUMIN 4.3 10/30/2021   ALKPHOS 88 10/30/2021   AMYLASE 132 (H) 10/30/2021   LIPASE 35 10/30/2021    Electrolytes Lab Results  Component Value Date   NA 142 06/19/2022   K 3.8 06/19/2022   CL 102 06/19/2022   CALCIUM 10.4 06/19/2022   MG 1.7 02/10/2016   PHOS 4.5 02/07/2016     Bone No results found for: "VD25OH", "VD125OH2TOT", "ZO1096EA5", "WU9811BJ4", "25OHVITD1", "25OHVITD2", "25OHVITD3", "TESTOFREE", "TESTOSTERONE"  Inflammation (CRP: Acute Phase) (ESR: Chronic Phase) Lab Results  Component Value Date   CRP 2.7 (H) 08/14/2019         Note: Above Lab results reviewed.   Physical Exam  General appearance: Well nourished, well developed, and well hydrated. In no apparent acute distress Mental status: Alert, oriented x 3 (person, place, & time)       Respiratory: No evidence of acute respiratory distress Eyes: PERLA Vitals: BP 134/64   Pulse 61   Temp (!) 97.2 F (36.2 C) (Temporal)   Resp 17   Ht 4\' 11"  (1.499 m)   Wt 150 lb (68 kg)   SpO2 97%   BMI 30.30 kg/m  BMI: Estimated body mass index is 30.3 kg/m as calculated from the following:   Height as of this encounter: 4\' 11"  (1.499 m).   Weight as of this encounter: 150 lb (68 kg). Ideal: Patient must be at least 60 in tall to calculate ideal body weight  Cervical Spine Area Exam  Skin & Axial Inspection: No masses, redness, edema, swelling, or associated skin lesions Alignment: Symmetrical Functional ROM: Pain restricted ROM, to the left, improvement for the right after right C-esi Stability: No instability detected Muscle Tone/Strength: Functionally intact. No obvious neuro-muscular anomalies detected. Sensory (Neurological): Dermatomal pain pattern for the left, right side doing better Palpation: No palpable anomalies             Upper Extremity (UE) Exam    Side: Right upper extremity  Side: Left upper extremity  Skin & Extremity  Inspection: Skin color, temperature, and hair growth are WNL. No peripheral edema or cyanosis. No masses, redness, swelling, asymmetry, or associated skin lesions. No contractures.  Skin & Extremity Inspection: Skin color, temperature, and hair growth are WNL. No peripheral edema or cyanosis. No masses, redness, swelling, asymmetry, or associated skin lesions.  No contractures.  Functional ROM: Unrestricted ROM          Functional ROM: Pain restricted ROM for shoulder and elbow  Muscle Tone/Strength: Functionally intact. No obvious neuro-muscular anomalies detected.  Muscle Tone/Strength: Functionally intact. No obvious neuro-muscular anomalies detected.  Sensory (Neurological): Unimpaired          Sensory (Neurological): Dermatomal pain pattern          Palpation: No palpable anomalies              Palpation: No palpable anomalies              Provocative Test(s):  Phalen's test: deferred Tinel's test: deferred Apley's scratch test (touch opposite shoulder):  Action 1 (Across chest): deferred Action 2 (Overhead): deferred Action 3 (LB reach): deferred   Provocative Test(s):  Phalen's test: deferred Tinel's test: deferred Apley's scratch test (touch opposite shoulder):  Action 1 (Across chest): Decreased ROM Action 2 (Overhead): Decreased ROM Action 3 (LB reach): Decreased ROM     Assessment   Diagnosis Status  1. 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 Cervical radicular pain   4. Chronic pain syndrome    Responding Responding Persistent   Updated Problems: No problems updated.  Plan of Care  Improvement in right cervical radicular symptoms, patient now experiencing left cervical radicular pain.  She would like to repeat the injection focusing on the left side.  Plan for left C7-T1 ESI with IV sedation as last time.   Orders:  Orders Placed This Encounter  Procedures   Cervical Epidural Injection    Sedation: Patient's choice. Purpose: Diagnostic/Therapeutic Indication(s): Radiculitis and cervicalgia associater with cervical degenerative disc disease.    Standing Status:   Future    Standing Expiration Date:   04/08/2023    Scheduling Instructions:     Procedure: Cervical Epidural Steroid Injection/Block     Level(s): C7-T1      Laterality: LEFT     Timeframe: As soon as schedule allows    Order Specific Question:   Where will this procedure be performed?    Answer:   [facility]    Comments:   Lashawn Orrego   Follow-up plan:   Return for Left C7-T1 ESI, in clinic IV Versed.      R C7/T1 ESI 12/02/22    Recent Visits Date Type Provider Dept  12/02/22 Procedure visit Edward Jolly, MD [facility]  Showing recent visits within past 90 days and meeting all other requirements Today's Visits Date Type Provider Dept  01/06/23 Office Visit Edward Jolly, MD [facility]  Showing today's visits and meeting all other requirements Future Appointments No visits were found meeting these conditions. Showing future appointments within next 90 days and meeting all other requirements  I discussed the assessment and treatment plan with the patient. The patient was provided an opportunity to ask questions and all were answered. The patient agreed with the plan and demonstrated an understanding of the instructions.  Patient advised to call back or seek an in-person evaluation if the symptoms or condition worsens.  Duration of  encounter: .  Total time on encounter, as per AMA guidelines included both the face-to-face and non-face-to-face time personally spent by the physician and/or other qualified health care professional(s) on the day of the encounter (includes time in activities that require the physician or other qualified health care professional and does not include time in activities normally performed by clinical staff). Physician's time may include the following activities when performed: Preparing to see the patient (e.g., pre-charting review of records, searching for previously ordered imaging, lab work, and nerve conduction tests) Review of prior analgesic pharmacotherapies. Reviewing PMP Interpreting ordered tests (e.g., lab work, imaging, nerve conduction tests) Performing  post-procedure evaluations, including interpretation of diagnostic procedures Obtaining and/or reviewing separately obtained history Performing a medically appropriate examination and/or evaluation Counseling and educating the patient/family/caregiver Ordering medications, tests, or procedures Referring and communicating with other health care professionals (when not separately reported) Documenting clinical information in the electronic or other health record Independently interpreting results (not separately reported) and communicating results to the patient/ family/caregiver Care coordination (not separately reported)  Note by: Edward Jolly, MD Date: 01/06/2023; Time: 3:20 PM

## 2023-01-06 NOTE — Patient Instructions (Addendum)
Stop taking Eliquis 3 days prior to procedure.  Moderate Conscious Sedation, Adult Sedation is the use of medicines to help you relax and not feel pain. Moderate conscious sedation is a type of sedation that makes you less alert than normal. You are still able to respond to instructions, touch, or both. This type of sedation is used during short medical and dental procedures. It is milder than deep sedation, which is a type of sedation you cannot be easily woken up from. It is also milder than general anesthesia, which is the use of medicines to make you fall asleep. Moderate conscious sedation lets you return to your normal activities sooner. Tell a health care provider about: Any allergies you have. All medicines you are taking, including vitamins, herbs, steroids, eye drops, creams, and over-the-counter medicines. Any problems you or family members have had with anesthesia. Any bleeding problems you have. Any surgeries you have had. Any medical conditions you have. Whether you are pregnant or may be pregnant. Any recent alcohol, tobacco, or drug use. What are the risks? Your health care provider will talk with you about risks. These may include: Oversedation. This is when you get too much medicine. Nausea or vomiting. Allergic reaction to medicines. Trouble breathing. If this happens, a breathing tube may be used. It will be removed when you can breathe better on your own. Heart trouble. Lung trouble. Emergence delirium. This is when you feel confused while the sedation wears off. This gets better with time. What happens before the procedure? When to stop eating and drinking Follow instructions from your health care provider about what you may eat and drink. These may include: 8 hours before your procedure Stop eating most foods. Do not eat meat, fried foods, or fatty foods. Eat only light foods, such as toast or crackers. All liquids are okay except energy drinks and alcohol. 6  hours before your procedure Stop eating. Drink only clear liquids, such as water, clear fruit juice, black coffee, plain tea, and sports drinks. Do not drink energy drinks or alcohol. 2 hours before your procedure Stop drinking all liquids. You may be allowed to take medicines with small sips of water. If you do not follow your health care provider's instructions, your procedure may be delayed or canceled. Medicines Ask your health care provider about: Changing or stopping your regular medicines. These include any diabetes medicines or blood thinners you take. Taking medicines such as aspirin and ibuprofen. These medicines can thin your blood. Do not take them unless your health care provider tells you to. Taking over-the-counter medicines, vitamins, herbs, and supplements. Tests and exams You may have an exam or testing. You may have a blood or urine sample taken. General instructions Do not use any products that contain nicotine or tobacco for at least 4 weeks before the procedure. These products include cigarettes, chewing tobacco, and vaping devices, such as e-cigarettes. If you need help quitting, ask your health care provider. If you will be going home right after the procedure, plan to have a responsible adult: Take you home from the hospital or clinic. You will not be allowed to drive. Care for you for the time you are told. What happens during the procedure?  You will be given the sedative. It may be given: As a pill you can take by mouth. It can also be put into the rectum. As a spray through the nose. As an injection into muscle. As an injection into a vein through an IV. You may  be given oxygen as needed. Your blood pressure, heart rate, breathing rate, and blood oxygen level will be monitored during the procedure. The medical or dental procedure will be done. The procedure may vary among health care providers and hospitals. What happens after the procedure? Your blood  pressure, heart rate, breathing rate, and blood oxygen level will be monitored until you leave the hospital or clinic. You will get fluids through an IV as needed. Do not drive or operate machinery until your health care provider says that it is safe. This information is not intended to replace advice given to you by your health care provider. Make sure you discuss any questions you have with your health care provider. Document Revised: 02/04/2022 Document Reviewed: 02/04/2022 [employer] Patient Education  2024 [employer] GENERAL RISKS AND COMPLICATIONS  What are the risk, side effects and possible complications? Generally speaking, most procedures are safe.  However, with any procedure there are risks, side effects, and the possibility of complications.  The risks and complications are dependent upon the sites that are lesioned, or the type of nerve block to be performed.  The closer the procedure is to the spine, the more serious the risks are.  Great care is taken when placing the radio frequency needles, block needles or lesioning probes, but sometimes complications can occur. Infection: Any time there is an injection through the skin, there is a risk of infection.  This is why sterile conditions are used for these blocks.  There are four possible types of infection. Localized skin infection. Central Nervous System Infection-This can be in the form of Meningitis, which can be deadly. Epidural Infections-This can be in the form of an epidural abscess, which can cause pressure inside of the spine, causing compression of the spinal cord with subsequent paralysis. This would require an emergency surgery to decompress, and there are no guarantees that the patient would recover from the paralysis. Discitis-This is an infection of the intervertebral discs.  It occurs in about 1% of discography procedures.  It is difficult to treat and it may lead to surgery.        2. Pain: the needles have to go  through skin and soft tissues, will cause soreness.       3. Damage to internal structures:  The nerves to be lesioned may be near blood vessels or    other nerves which can be potentially damaged.       4. Bleeding: Bleeding is more common if the patient is taking blood thinners such as  aspirin, Coumadin, Ticiid, Plavix, etc., or if he/she have some genetic predisposition  such as hemophilia. Bleeding into the spinal canal can cause compression of the spinal  cord with subsequent paralysis.  This would require an emergency surgery to  decompress and there are no guarantees that the patient would recover from the  paralysis.       5. Pneumothorax:  Puncturing of a lung is a possibility, every time a needle is introduced in  the area of the chest or upper back.  Pneumothorax refers to free air around the  collapsed lung(s), inside of the thoracic cavity (chest cavity).  Another two possible  complications related to a similar event would include: Hemothorax and Chylothorax.   These are variations of the Pneumothorax, where instead of air around the collapsed  lung(s), you may have blood or chyle, respectively.       6. Spinal headaches: They may occur with any procedures in the area  of the spine.       7. Persistent CSF (Cerebro-Spinal Fluid) leakage: This is a rare problem, but may occur  with prolonged intrathecal or epidural catheters either due to the formation of a fistulous  track or a dural tear.       8. Nerve damage: By working so close to the spinal cord, there is always a possibility of  nerve damage, which could be as serious as a permanent spinal cord injury with  paralysis.       9. Death:  Although rare, severe deadly allergic reactions known as "Anaphylactic  reaction" can occur to any of the medications used.      10. Worsening of the symptoms:  We can always make thing worse.  What are the chances of something like this happening? Chances of any of this occuring are extremely low.  By  statistics, you have more of a chance of getting killed in a motor vehicle accident: while driving to the hospital than any of the above occurring .  Nevertheless, you should be aware that they are possibilities.  In general, it is similar to taking a shower.  Everybody knows that you can slip, hit your head and get killed.  Does that mean that you should not shower again?  Nevertheless always keep in mind that statistics do not mean anything if you happen to be on the wrong side of them.  Even if a procedure has a 1 (one) in a 1,000,000 (million) chance of going wrong, it you happen to be that one..Also, keep in mind that by statistics, you have more of a chance of having something go wrong when taking medications.  Who should not have this procedure? If you are on a blood thinning medication (e.g. Coumadin, Plavix, see list of "Blood Thinners"), or if you have an active infection going on, you should not have the procedure.  If you are taking any blood thinners, please inform your physician.  How should I prepare for this procedure? Do not eat or drink anything at least six hours prior to the procedure. Bring a driver with you .  It cannot be a taxi. Come accompanied by an adult that can drive you back, and that is strong enough to help you if your legs get weak or numb from the local anesthetic. Take all of your medicines the morning of the procedure with just enough water to swallow them. If you have diabetes, make sure that you are scheduled to have your procedure done first thing in the morning, whenever possible. If you have diabetes, take only half of your insulin dose and notify our nurse that you have done so as soon as you arrive at the clinic. If you are diabetic, but only take blood sugar pills (oral hypoglycemic), then do not take them on the morning of your procedure.  You may take them after you have had the procedure. Do not take aspirin or any aspirin-containing medications, at least  eleven (11) days prior to the procedure.  They may prolong bleeding. Wear loose fitting clothing that may be easy to take off and that you would not mind if it got stained with Betadine or blood. Do not wear any jewelry or perfume Remove any nail coloring.  It will interfere with some of our monitoring equipment.  NOTE: Remember that this is not meant to be interpreted as a complete list of all possible complications.  Unforeseen problems may occur.  BLOOD THINNERS The following  drugs contain aspirin or other products, which can cause increased bleeding during surgery and should not be taken for 2 weeks prior to and 1 week after surgery.  If you should need take something for relief of minor pain, you may take acetaminophen which is found in Tylenol,m Datril, Anacin-3 and Panadol. It is not blood thinner. The products listed below are.  Do not take any of the products listed below in addition to any listed on your instruction sheet.  A.P.C or A.P.C with Codeine Codeine Phosphate Capsules #3 Ibuprofen Ridaura  ABC compound Congesprin Imuran rimadil  Advil Cope Indocin Robaxisal  Alka-Seltzer Effervescent Pain Reliever and Antacid Coricidin or Coricidin-D  Indomethacin Rufen  Alka-Seltzer plus Cold Medicine Cosprin Ketoprofen S-A-C Tablets  Anacin Analgesic Tablets or Capsules Coumadin Korlgesic Salflex  Anacin Extra Strength Analgesic tablets or capsules CP-2 Tablets Lanoril Salicylate  Anaprox Cuprimine Capsules Levenox Salocol  Anexsia-D Dalteparin Magan Salsalate  Anodynos Darvon compound Magnesium Salicylate Sine-off  Ansaid Dasin Capsules Magsal Sodium Salicylate  Anturane Depen Capsules Marnal Soma  APF Arthritis pain formula Dewitt's Pills Measurin Stanback  Argesic Dia-Gesic Meclofenamic Sulfinpyrazone  Arthritis Bayer Timed Release Aspirin Diclofenac Meclomen Sulindac  Arthritis pain formula Anacin Dicumarol Medipren Supac  Analgesic (Safety coated) Arthralgen Diffunasal Mefanamic  Suprofen  Arthritis Strength Bufferin Dihydrocodeine Mepro Compound Suprol  Arthropan liquid Dopirydamole Methcarbomol with Aspirin Synalgos  ASA tablets/Enseals Disalcid Micrainin Tagament  Ascriptin Doan's Midol Talwin  Ascriptin A/D Dolene Mobidin Tanderil  Ascriptin Extra Strength Dolobid Moblgesic Ticlid  Ascriptin with Codeine Doloprin or Doloprin with Codeine Momentum Tolectin  Asperbuf Duoprin Mono-gesic Trendar  Aspergum Duradyne Motrin or Motrin IB Triminicin  Aspirin plain, buffered or enteric coated Durasal Myochrisine Trigesic  Aspirin Suppositories Easprin Nalfon Trillsate  Aspirin with Codeine Ecotrin Regular or Extra Strength Naprosyn Uracel  Atromid-S Efficin Naproxen Ursinus  Auranofin Capsules Elmiron Neocylate Vanquish  Axotal Emagrin Norgesic Verin  Azathioprine Empirin or Empirin with Codeine Normiflo Vitamin E  Azolid Emprazil Nuprin Voltaren  Bayer Aspirin plain, buffered or children's or timed BC Tablets or powders Encaprin Orgaran Warfarin Sodium  Buff-a-Comp Enoxaparin Orudis Zorpin  Buff-a-Comp with Codeine Equegesic Os-Cal-Gesic   Buffaprin Excedrin plain, buffered or Extra Strength Oxalid   Bufferin Arthritis Strength Feldene Oxphenbutazone   Bufferin plain or Extra Strength Feldene Capsules Oxycodone with Aspirin   Bufferin with Codeine Fenoprofen Fenoprofen Pabalate or Pabalate-SF   Buffets II Flogesic Panagesic   Buffinol plain or Extra Strength Florinal or Florinal with Codeine Panwarfarin   Buf-Tabs Flurbiprofen Penicillamine   Butalbital Compound Four-way cold tablets Penicillin   Butazolidin Fragmin Pepto-Bismol   Carbenicillin Geminisyn Percodan   Carna Arthritis Reliever Geopen Persantine   Carprofen Gold's salt Persistin   Chloramphenicol Goody's Phenylbutazone   Chloromycetin Haltrain Piroxlcam   Clmetidine heparin Plaquenil   Cllnoril Hyco-pap Ponstel   Clofibrate Hydroxy chloroquine Propoxyphen         Before stopping any of these  medications, be sure to consult the physician who ordered them.  Some, such as Coumadin (Warfarin) are ordered to prevent or treat serious conditions such as "deep thrombosis", "pumonary embolisms", and other heart problems.  The amount of time that you may need off of the medication may also vary with the medication and the reason for which you were taking it.  If you are taking any of these medications, please make sure you notify your pain physician before you undergo any procedures.         Epidural Steroid Injection Patient Information  Description: The epidural space surrounds the nerves as they exit the spinal cord.  In some patients, the nerves can be compressed and inflamed by a bulging disc or a tight spinal canal (spinal stenosis).  By injecting steroids into the epidural space, we can bring irritated nerves into direct contact with a potentially helpful medication.  These steroids act directly on the irritated nerves and can reduce swelling and inflammation which often leads to decreased pain.  Epidural steroids may be injected anywhere along the spine and from the neck to the low back depending upon the location of your pain.   After numbing the skin with local anesthetic (like Novocaine), a small needle is passed into the epidural space slowly.  You may experience a sensation of pressure while this is being done.  The entire block usually last less than 10 minutes.  Conditions which may be treated by epidural steroids:  Low back and leg pain Neck and arm pain Spinal stenosis Post-laminectomy syndrome Herpes zoster (shingles) pain Pain from compression fractures  Preparation for the injection:  Do not eat any solid food or dairy products within 8 hours of your appointment.  You may drink clear liquids up to 3 hours before appointment.  Clear liquids include water, black coffee, juice or soda.  No milk or cream please. You may take your regular medication, including pain  medications, with a sip of water before your appointment  Diabetics should hold regular insulin (if taken separately) and take 1/2 normal NPH dos the morning of the procedure.  Carry some sugar containing items with you to your appointment. A driver must accompany you and be prepared to drive you home after your procedure.  Bring all your current medications with your. An IV may be inserted and sedation may be given at the discretion of the physician.   A blood pressure cuff, EKG and other monitors will often be applied during the procedure.  Some patients may need to have extra oxygen administered for a short period. You will be asked to provide medical information, including your allergies, prior to the procedure.  We must know immediately if you are taking blood thinners (like Coumadin/Warfarin)  Or if you are allergic to IV iodine contrast (dye). We must know if you could possible be pregnant.  Possible side-effects: Bleeding from needle site Infection (rare, may require surgery) Nerve injury (rare) Numbness & tingling (temporary) Difficulty urinating (rare, temporary) Spinal headache ( a headache worse with upright posture) Light -headedness (temporary) Pain at injection site (several days) Decreased blood pressure (temporary) Weakness in arm/leg (temporary) Pressure sensation in back/neck (temporary)  Call if you experience: Fever/chills associated with headache or increased back/neck pain. Headache worsened by an upright position. New onset weakness or numbness of an extremity below the injection site Hives or difficulty breathing (go to the emergency room) Inflammation or drainage at the infection site Severe back/neck pain Any new symptoms which are concerning to you  Please note:  Although the local anesthetic injected can often make your back or neck feel good for several hours after the injection, the pain will likely return.  It takes 3-7 days for steroids to work in the  epidural space.  You may not notice any pain relief for at least that one week.  If effective, we will often do a series of three injections spaced 3-6 weeks apart to maximally decrease your pain.  After the initial series, we generally will wait several months before considering a repeat injection of  the same type.  If you have any questions, please call [phone] [facility]

## 2023-01-10 ENCOUNTER — Other Ambulatory Visit: Payer: Self-pay | Admitting: Nephrology

## 2023-01-10 DIAGNOSIS — E875 Hyperkalemia: Secondary | ICD-10-CM

## 2023-01-10 DIAGNOSIS — N2581 Secondary hyperparathyroidism of renal origin: Secondary | ICD-10-CM

## 2023-01-13 ENCOUNTER — Encounter
Admission: RE | Admit: 2023-01-13 | Discharge: 2023-01-13 | Disposition: A | Discharge status: Home / Self Care | Payer: Medicare Other | Source: Ambulatory Visit | Attending: Nephrology | Admitting: Nephrology

## 2023-01-13 DIAGNOSIS — N2581 Secondary hyperparathyroidism of renal origin: Secondary | ICD-10-CM | POA: Diagnosis not present

## 2023-01-13 DIAGNOSIS — E875 Hyperkalemia: Secondary | ICD-10-CM | POA: Diagnosis not present

## 2023-01-13 DIAGNOSIS — E213 Hyperparathyroidism, unspecified: Secondary | ICD-10-CM | POA: Diagnosis not present

## 2023-01-13 MED ORDER — TECHNETIUM TC 99M SESTAMIBI - CARDIOLITE
25.0000 | Freq: Once | INTRAVENOUS | Status: AC
  Administered 2023-01-13: 26.57 via INTRAVENOUS

## 2023-01-15 ENCOUNTER — Ambulatory Visit
Payer: Medicare Other | Attending: Student in an Organized Health Care Education/Training Program | Admitting: Student in an Organized Health Care Education/Training Program

## 2023-01-15 ENCOUNTER — Ambulatory Visit
Admission: RE | Admit: 2023-01-15 | Discharge: 2023-01-15 | Disposition: A | Discharge status: Home / Self Care | Payer: Medicare Other | Source: Ambulatory Visit | Attending: Student in an Organized Health Care Education/Training Program | Admitting: Student in an Organized Health Care Education/Training Program

## 2023-01-15 ENCOUNTER — Encounter: Payer: Self-pay | Admitting: Student in an Organized Health Care Education/Training Program

## 2023-01-15 VITALS — BP 161/64 | HR 61 | Temp 97.4°F | Resp 15 | Ht 59.0 in | Wt 150.0 lb

## 2023-01-15 DIAGNOSIS — M5412 Radiculopathy, cervical region: Secondary | ICD-10-CM | POA: Insufficient documentation

## 2023-01-15 DIAGNOSIS — M4802 Spinal stenosis, cervical region: Secondary | ICD-10-CM | POA: Insufficient documentation

## 2023-01-15 MED ORDER — SODIUM CHLORIDE (PF) 0.9 % IJ SOLN
INTRAMUSCULAR | Status: AC
  Filled 2023-01-15: qty 10

## 2023-01-15 MED ORDER — ROPIVACAINE HCL 2 MG/ML IJ SOLN
INTRAMUSCULAR | Status: AC
  Filled 2023-01-15: qty 20

## 2023-01-15 MED ORDER — MIDAZOLAM HCL 2 MG/2ML IJ SOLN: 0.5000 mg | Freq: Once | INTRAMUSCULAR | Status: DC

## 2023-01-15 MED ORDER — MIDAZOLAM HCL 2 MG/2ML IJ SOLN
INTRAMUSCULAR | Status: AC
  Filled 2023-01-15: qty 2

## 2023-01-15 MED ORDER — IOHEXOL 180 MG/ML  SOLN
10.0000 mL | Freq: Once | INTRAMUSCULAR | Status: AC
  Administered 2023-01-15: 10 mL via EPIDURAL
  Filled 2023-01-15: qty 20

## 2023-01-15 MED ORDER — SODIUM CHLORIDE 0.9% FLUSH
1.0000 mL | Freq: Once | INTRAVENOUS | Status: AC
  Administered 2023-01-15: 1 mL

## 2023-01-15 MED ORDER — DEXAMETHASONE SODIUM PHOSPHATE 10 MG/ML IJ SOLN
10.0000 mg | Freq: Once | INTRAMUSCULAR | Status: AC
  Administered 2023-01-15: 10 mg

## 2023-01-15 MED ORDER — DIAZEPAM 5 MG PO TABS
ORAL_TABLET | ORAL | Status: AC
  Filled 2023-01-15: qty 1

## 2023-01-15 MED ORDER — DEXAMETHASONE SODIUM PHOSPHATE 10 MG/ML IJ SOLN
INTRAMUSCULAR | Status: AC
  Filled 2023-01-15: qty 1

## 2023-01-15 MED ORDER — DIAZEPAM 5 MG PO TABS
5.0000 mg | ORAL_TABLET | ORAL | Status: AC
  Administered 2023-01-15: 5 mg via ORAL

## 2023-01-15 MED ORDER — LACTATED RINGERS IV SOLN: Freq: Once | INTRAVENOUS | Status: DC

## 2023-01-15 MED ORDER — ROPIVACAINE HCL 2 MG/ML IJ SOLN
1.0000 mL | Freq: Once | INTRAMUSCULAR | Status: AC
  Administered 2023-01-15: 1 mL via EPIDURAL

## 2023-01-15 MED ORDER — LIDOCAINE HCL 2 % IJ SOLN
20.0000 mL | Freq: Once | INTRAMUSCULAR | Status: AC
  Administered 2023-01-15: 400 mg

## 2023-01-15 MED ORDER — LIDOCAINE HCL 2 % IJ SOLN
INTRAMUSCULAR | Status: AC
  Filled 2023-01-15: qty 20

## 2023-01-15 NOTE — Progress Notes (Signed)
PROVIDER NOTE: Interpretation of information contained herein should be left to medically-trained personnel. Specific patient instructions are provided elsewhere under "Patient Instructions" section of medical record. This document was created in part using STT-dictation technology, any transcriptional errors that may result from this process are unintentional.  Patient: Renee Boyd Type: Established DOB: [birth year] MRN: [id number] PCP: Smitty Cords, DO  Service: Procedure DOS: 01/15/2023 Setting: Ambulatory Location: Ambulatory outpatient facility Delivery: Face-to-face Provider: Edward Jolly, MD Specialty: Interventional Pain Management Specialty designation: 09 Location: Outpatient facility Ref. Prov.: Saralyn Pilar *       Interventional Therapy   Procedure: Cervical Epidural Steroid injection (CESI) (Interlaminar) #2  Laterality: Left  Level: C7-T1 Imaging: Fluoroscopy-assisted DOS: 01/15/2023  Performed by: Edward Jolly, MD Anesthesia: Local anesthesia (1-2% Lidocaine) Anxiolysis:IV Versed 2 mg  Purpose: Diagnostic/Therapeutic Indications: Cervicalgia, cervical radicular pain, degenerative disc disease, severe enough to impact quality of life or function. 1. 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 Cervical radicular pain    NAS-11 score:   Pre-procedure: 7 /10   Post-procedure: 1 /10      Position  Prep  Materials:  Location setting: Procedure suite Position: Prone, on modified reverse trendelenburg to facilitate breathing, with head in head-cradle. Pillows positioned under chest (below chin-level) with cervical spine flexed. Safety Precautions: Patient was assessed for positional comfort and pressure points before starting the procedure. Prepping solution: DuraPrep (Iodine Povacrylex [0.7% available iodine] and Isopropyl Alcohol, 74% w/w) Prep Area:  Entire  cervicothoracic region Approach: percutaneous, paramedial Intended target: Posterior cervical epidural space Materials Procedure:  Tray: Epidural Needle(s): Epidural (Tuohy) Qty: 1 Length: (90mm) 3.5-inch Gauge: 22G   Pre-op H&P Assessment:  Renee Boyd is a 72 y.o. (year old), female patient, seen today for interventional treatment. She  has a past surgical history that includes Abdominal hysterectomy; Cholecystectomy; Tonsillectomy; Tubal ligation; CARDIOVERSION (N/A, 05/28/2018); Cardioversion (N/A, 03/01/2020); Cardioversion (N/A, 10/25/2020); and PPM GENERATOR CHANGEOUT (N/A, 04/30/2022). [name] has a current medication list which includes the following prescription(s): acetaminophen, allopurinol, amlodipine, ascorbic acid, atorvastatin, baclofen, combivent respimat, contour next test, desonide, dulaglutide, furosemide, jardiance, levothyroxine, metoprolol succinate, multivitamin, omeprazole, temazepam, tramadol, and eliquis. Her primarily concern today is the Shoulder Pain (left)  Initial Vital Signs:  Pulse/HCG Rate: 84  Temp: (!) 97.4 F (36.3 C) Resp: 17 BP: (!) 147/74 SpO2: 94 %  BMI: Estimated body mass index is 30.3 kg/m as calculated from the following:   Height as of this encounter: 4\' 11"  (1.499 m).   Weight as of this encounter: 150 lb (68 kg).  Risk Assessment: Allergies: Reviewed. She is allergic to morphine and codeine, neosporin [bacitracin-polymyxin b], other, and percocet [oxycodone-acetaminophen].  Allergy Precautions: None required Coagulopathies: Reviewed. None identified.  Blood-thinner therapy: None at this time Active Infection(s): Reviewed. None identified. Renee Boyd is afebrile  Site Confirmation: Renee Boyd was asked to confirm the procedure and laterality before marking the site Procedure checklist: Completed Consent: Before the procedure and under the influence of no sedative(s), amnesic(s), or anxiolytics, the patient was informed of the  treatment options, risks and possible complications. To fulfill our ethical and legal obligations, as recommended by the American Medical Association's Code of Ethics, I have informed the patient of my clinical impression; the nature and purpose of the treatment or procedure; the risks, benefits, and possible complications of the intervention; the alternatives, including doing nothing; the risk(s) and benefit(s) of the alternative  treatment(s) or procedure(s); and the risk(s) and benefit(s) of doing nothing. The patient was provided information about the general risks and possible complications associated with the procedure. These may include, but are not limited to: failure to achieve desired goals, infection, bleeding, organ or nerve damage, allergic reactions, paralysis, and death. In addition, the patient was informed of those risks and complications associated to Spine-related procedures, such as failure to decrease pain; infection (i.e.: Meningitis, epidural or intraspinal abscess); bleeding (i.e.: epidural hematoma, subarachnoid hemorrhage, or any other type of intraspinal or peri-dural bleeding); organ or nerve damage (i.e.: Any type of peripheral nerve, nerve root, or spinal cord injury) with subsequent damage to sensory, motor, and/or autonomic systems, resulting in permanent pain, numbness, and/or weakness of one or several areas of the body; allergic reactions; (i.e.: anaphylactic reaction); and/or death. Furthermore, the patient was informed of those risks and complications associated with the medications. These include, but are not limited to: allergic reactions (i.e.: anaphylactic or anaphylactoid reaction(s)); adrenal axis suppression; blood sugar elevation that in diabetics may result in ketoacidosis or comma; water retention that in patients with history of congestive heart failure may result in shortness of breath, pulmonary edema, and decompensation with resultant heart failure; weight gain;  swelling or edema; medication-induced neural toxicity; particulate matter embolism and blood vessel occlusion with resultant organ, and/or nervous system infarction; and/or aseptic necrosis of one or more joints. Finally, the patient was informed that Medicine is not an exact science; therefore, there is also the possibility of unforeseen or unpredictable risks and/or possible complications that may result in a catastrophic outcome. The patient indicated having understood very clearly. We have given the patient no guarantees and we have made no promises. Enough time was given to the patient to ask questions, all of which were answered to the patient's satisfaction. Ms. Schroeter has indicated that she wanted to continue with the procedure. Attestation: I, the ordering provider, attest that I have discussed with the patient the benefits, risks, side-effects, alternatives, likelihood of achieving goals, and potential problems during recovery for the procedure that I have provided informed consent. Date  Time: 01/15/2023  9:37 AM   Pre-Procedure Preparation:  Monitoring: As per clinic protocol. Respiration, ETCO2, SpO2, BP, heart rate and rhythm monitor placed and checked for adequate function Safety Precautions: Patient was assessed for positional comfort and pressure points before starting the procedure. Time-out: I initiated and conducted the "Time-out" before starting the procedure, as per protocol. The patient was asked to participate by confirming the accuracy of the "Time Out" information. Verification of the correct person, site, and procedure were performed and confirmed by me, the nursing staff, and the patient. "Time-out" conducted as per Joint Commission's Universal Protocol (UP.01.01.01). Time: 1016 Start Time: 1016 hrs.  Description  Narrative of Procedure:          Rationale (medical necessity): procedure needed and proper for the diagnosis and/or treatment of the patient's medical symptoms  and needs. Start Time: 1016 hrs. Safety Precautions: Aspiration looking for blood return was conducted prior to all injections. At no point did we inject any substances, as a needle was being advanced. No attempts were made at seeking any paresthesias. Safe injection practices and needle disposal techniques used. Medications properly checked for expiration dates. SDV (single dose vial) medications used. Description of procedure: Protocol guidelines were followed. The patient was assisted into a comfortable position. The target area was identified and the area prepped in the usual manner. Skin & deeper tissues infiltrated with local  anesthetic. Appropriate amount of time allowed to pass for local anesthetics to take effect. Using fluoroscopic guidance, the epidural needle was introduced through the skin, ipsilateral to the reported pain, and advanced to the target area. Posterior laminar os was contacted and the needle walked caudad, until the lamina was cleared. The ligamentum flavum was engaged and the epidural space identified using "loss-of-resistance technique" with 2-3 ml of PF-NaCl (0.9% NSS), in a 5cc dedicated LOR syringe. (See "Imaging guidance" below for use of contrast details.) Once proper needle placement was secured, and negative aspiration confirmed, the solution was injected in intermittent fashion, asking for systemic symptoms every 0.5cc. The needles were then removed and the area cleansed, making sure to leave some of the prepping solution back to take advantage of its long term bactericidal properties.  3 cc solution made of 1 cc of preservative-free saline, 1 cc of 0.2% ropivacaine, 1 cc of Decadron 10 mg/cc.   Vitals:   01/15/23 1007 01/15/23 1012 01/15/23 1017 01/15/23 1023  BP: (!) 151/77 (!) 156/67 (!) 156/67 (!) 161/64  Pulse: 76 61 60 61  Resp: 18 14 16 15   Temp:      TempSrc:      SpO2: 98% 100% 99% 99%  Weight:      Height:         End Time: 1019 hrs.  Imaging  Guidance (Spinal):          Type of Imaging Technique: Fluoroscopy Guidance (Spinal) Indication(s): Assistance in needle guidance and placement for procedures requiring needle placement in or near specific anatomical locations not easily accessible without such assistance. Exposure Time: Please see nurses notes. Contrast: Before injecting any contrast, we confirmed that the patient did not have an allergy to iodine, shellfish, or radiological contrast. Once satisfactory needle placement was completed at the desired level, radiological contrast was injected. Contrast injected under live fluoroscopy. No contrast complications. See chart for type and volume of contrast used. Fluoroscopic Guidance: I was personally present during the use of fluoroscopy. "Tunnel Vision Technique" used to obtain the best possible view of the target area. Parallax error corrected before commencing the procedure. "Direction-depth-direction" technique used to introduce the needle under continuous pulsed fluoroscopy. Once target was reached, antero-posterior, oblique, and lateral fluoroscopic projection used confirm needle placement in all planes. Images permanently stored in EMR. Interpretation: I personally interpreted the imaging intraoperatively. Adequate needle placement confirmed in multiple planes. Appropriate spread of contrast into desired area was observed. No evidence of afferent or efferent intravascular uptake. No intrathecal or subarachnoid spread observed. Permanent images saved into the patient's record.  Post-operative Assessment:  Post-procedure Vital Signs:  Pulse/HCG Rate: 61  Temp: (!) 97.4 F (36.3 C) Resp: 15 BP: (!) 161/64 SpO2: 99 %  EBL: None  Complications: No immediate post-treatment complications observed by team, or reported by patient.  Note: The patient tolerated the entire procedure well. A repeat set of vitals were taken after the procedure and the patient was kept under observation  following institutional policy, for this type of procedure. Post-procedural neurological assessment was performed, showing return to baseline, prior to discharge. The patient was provided with post-procedure discharge instructions, including a section on how to identify potential problems. Should any problems arise concerning this procedure, the patient was given instructions to immediately contact us, at any time, without hesitation. In any case, we plan to contact the patient by telephone for a follow-up status report regarding this interventional procedure.  Comments:  No additional relevant information.  Plan  of Care (POC)  Orders:  Orders Placed This Encounter  Procedures   DG PAIN CLINIC C-ARM 1-60 MIN NO REPORT    Intraoperative interpretation by procedural physician at [facility].    Standing Status:   Standing    Number of Occurrences:   1    Order Specific Question:   Reason for exam:    Answer:   Assistance in needle guidance and placement for procedures requiring needle placement in or near specific anatomical locations not easily accessible without such assistance.     Medications ordered for procedure: Meds ordered this encounter  Medications   iohexol (OMNIPAQUE) 180 MG/ML injection 10 mL    Must be Myelogram-compatible. If not available, you may substitute with a water-soluble, non-ionic, hypoallergenic, myelogram-compatible radiological contrast medium.   lidocaine (XYLOCAINE) 2 % (with pres) injection 400 mg   DISCONTD: lactated ringers infusion   DISCONTD: midazolam (VERSED) injection 0.5-2 mg    Make sure Flumazenil is available in the pyxis when using this medication. If oversedation occurs, administer 0.2 mg IV over 15 sec. If after 45 sec no response, administer 0.2 mg again over 1 min; may repeat at 1 min intervals; not to exceed 4 doses (1 mg)   sodium chloride flush (NS) 0.9 % injection 1 mL   ropivacaine (PF) 2 mg/mL (0.2%) (NAROPIN) injection 1 mL    dexamethasone (DECADRON) injection 10 mg   diazepam (VALIUM) tablet 5 mg    Make sure Flumazenil is available in the pyxis when using this medication. If oversedation occurs, administer 0.2 mg IV over 15 sec. If after 45 sec no response, administer 0.2 mg again over 1 min; may repeat at 1 min intervals; not to exceed 4 doses (1 mg)   Medications administered: We administered iohexol, lidocaine, sodium chloride flush, ropivacaine (PF) 2 mg/mL (0.2%), dexamethasone, and diazepam.  See the medical record for exact dosing, route, and time of administration.  Follow-up plan:   Return in about 5 weeks (around 02/19/2023), or ppe [id number].       R C7/T1 ESI 12/02/22; L C7/T1 ESI 01/15/23    Recent Visits Date Type Provider Dept  01/06/23 Office Visit Edward Jolly, MD [facility]  12/02/22 Procedure visit Edward Jolly, MD [facility]  Showing recent visits within past 90 days and meeting all other requirements Today's Visits Date Type Provider Dept  01/15/23 Procedure visit Edward Jolly, MD [facility]  Showing today's visits and meeting all other requirements Future Appointments Date Type Provider Dept  02/19/23 Appointment Edward Jolly, MD [facility]  Showing future appointments within next 90 days and meeting all other requirements  Disposition: Discharge home  Discharge (Date  Time): 01/15/2023; 1025 hrs.   Primary Care Physician: Smitty Cords, DO Location: [facility] Note by: Edward Jolly, MD (TTS technology used. I apologize for any typographical errors that were not detected and corrected.) Date: 01/15/2023; Time: 10:43 AM  Disclaimer:  Medicine is not an Visual merchandiser. The only guarantee in medicine is that nothing is guaranteed. It is important to note that the decision to proceed with this intervention was based on the information collected from the patient. The Data and conclusions were drawn from  the patient's questionnaire, the interview, and the physical examination. Because the information was provided in large part by the patient, it cannot be guaranteed that it has not been purposely or unconsciously manipulated. Every effort has been made to obtain as much relevant data  as possible for this evaluation. It is important to note that the conclusions that lead to this procedure are derived in large part from the available data. Always take into account that the treatment will also be dependent on availability of resources and existing treatment guidelines, considered by other Pain Management Practitioners as being common knowledge and practice, at the time of the intervention. For Medico-Legal purposes, it is also important to point out that variation in procedural techniques and pharmacological choices are the acceptable norm. The indications, contraindications, technique, and results of the above procedure should only be interpreted and judged by a Board-Certified Interventional Pain Specialist with extensive familiarity and expertise in the same exact procedure and technique.

## 2023-01-15 NOTE — Patient Instructions (Signed)

## 2023-01-15 NOTE — Progress Notes (Signed)
Safety precautions to be maintained throughout the outpatient stay will include: orient to surroundings, keep bed in low position, maintain call bell within reach at all times, provide assistance with transfer out of bed and ambulation.  

## 2023-01-16 ENCOUNTER — Telehealth: Payer: Self-pay | Admitting: *Deleted

## 2023-01-16 NOTE — Telephone Encounter (Signed)
Post procedure call; patient reports that she is doing great.  Reports that she is not having any pain currently.

## 2023-01-28 ENCOUNTER — Other Ambulatory Visit: Payer: Self-pay | Admitting: Internal Medicine

## 2023-01-28 ENCOUNTER — Ambulatory Visit (INDEPENDENT_AMBULATORY_CARE_PROVIDER_SITE_OTHER): Payer: Medicare Other

## 2023-01-28 DIAGNOSIS — I442 Atrioventricular block, complete: Secondary | ICD-10-CM | POA: Diagnosis not present

## 2023-01-28 LAB — CUP PACEART REMOTE DEVICE CHECK
Battery Remaining Longevity: 132 mo
Battery Voltage: 3.06 V
Brady Statistic AP VP Percent: 94.17 %
Brady Statistic AP VS Percent: 0.18 %
Brady Statistic AS VP Percent: 5.19 %
Brady Statistic AS VS Percent: 0.45 %
Brady Statistic RA Percent Paced: 78.9 %
Brady Statistic RV Percent Paced: 99.32 %
Date Time Interrogation Session: 20240624222429
Implantable Lead Connection Status: 753985
Implantable Lead Connection Status: 753985
Implantable Lead Implant Date: 20141217
Implantable Lead Implant Date: 20141217
Implantable Lead Location: 753859
Implantable Lead Location: 753860
Implantable Lead Model: 5076
Implantable Lead Model: 5076
Implantable Pulse Generator Implant Date: 20230926
Lead Channel Impedance Value: 380 Ohm
Lead Channel Impedance Value: 399 Ohm
Lead Channel Impedance Value: 532 Ohm
Lead Channel Impedance Value: 589 Ohm
Lead Channel Pacing Threshold Amplitude: 0.375 V
Lead Channel Pacing Threshold Amplitude: 0.625 V
Lead Channel Pacing Threshold Pulse Width: 0.4 ms
Lead Channel Pacing Threshold Pulse Width: 0.4 ms
Lead Channel Sensing Intrinsic Amplitude: 12.75 mV
Lead Channel Sensing Intrinsic Amplitude: 12.75 mV
Lead Channel Sensing Intrinsic Amplitude: 3.875 mV
Lead Channel Sensing Intrinsic Amplitude: 3.875 mV
Lead Channel Setting Pacing Amplitude: 1.5 V
Lead Channel Setting Pacing Amplitude: 2 V
Lead Channel Setting Pacing Pulse Width: 0.4 ms
Lead Channel Setting Sensing Sensitivity: 2 mV
Zone Setting Status: 755011

## 2023-01-31 ENCOUNTER — Telehealth: Payer: Self-pay

## 2023-01-31 NOTE — Telephone Encounter (Signed)
Pt's son advised  (On DPR)  He is going to have his mom contact endocrinology.  Pt is having a hard time finding Trulicity in stock.    Thanks,   -Vernona Rieger

## 2023-01-31 NOTE — Telephone Encounter (Signed)
Copied from CRM [id number]. Topic: General - Inquiry >> Jan 31, 2023 12:12 PM Yolanda T wrote: Reason for CRM: Feliz Beam patient son is requesting that someone call his mother to check on her as she is refusing to call and request her insulin. He was not aware of the name of the medication. He has not had her insulin for 2 weeks. She can be reached at [phone]. He also doesn't want her to know he called the clinin

## 2023-01-31 NOTE — Telephone Encounter (Signed)
She has an appointment with you on 07/09.  Do you want me to try and get her in sooner?   Thanks,   -Vernona Rieger

## 2023-01-31 NOTE — Telephone Encounter (Signed)
Actually, I am okay to keep 02/11/23  She is not on insulin actually. He may be referring to Trulicity? Weekly non insulin GLP injection. It is okay to miss this, certainly not ideal over long period of time, but short span missing a few weeks is okay we can assist her further at next apt.  Also, he Diabetes is currently being managed by [facility]. They have been the ones ordering this medicine. She saw them in May 2024.  I would suggest that her son contact [facility] if there is a problem with getting the rx filled  Saralyn Pilar, DO [facility] 01/31/2023, 1:16 PM

## 2023-02-03 ENCOUNTER — Other Ambulatory Visit: Payer: Self-pay | Admitting: Family Medicine

## 2023-02-03 DIAGNOSIS — K219 Gastro-esophageal reflux disease without esophagitis: Secondary | ICD-10-CM

## 2023-02-04 NOTE — Telephone Encounter (Signed)
Requested Prescriptions  Pending Prescriptions Disp Refills   omeprazole (PRILOSEC) 20 MG capsule [Pharmacy Med Name: OMEPRAZOLE DR CAPS 20MG ] 180 capsule 0    Sig: TAKE 1 CAPSULE TWICE A DAY BEFORE MEALS     Gastroenterology: Proton Pump Inhibitors Passed - 02/03/2023 12:09 AM      Passed - Valid encounter within last 12 months    Recent Outpatient Visits           1 month ago Chronic right shoulder pain   [place] [facility] [place], Netta Neat, DO   7 months ago Type 2 diabetes mellitus with hyperlipidemia [facility])   [place] [facility] Smitty Cords, DO   10 months ago Chronic right shoulder pain   [place] [facility] [place], Netta Neat, DO   1 year ago Type 2 diabetes mellitus with hyperlipidemia [facility])   [place] [facility] Smitty Cords, DO   1 year ago Benign hypertension with CKD (chronic kidney disease) stage IV [facility])   [place] [facility] [place], Netta Neat, DO       Future Appointments             In 1 week Althea Charon, Netta Neat, DO  [facility]

## 2023-02-11 ENCOUNTER — Encounter: Payer: Self-pay | Admitting: Family Medicine

## 2023-02-11 ENCOUNTER — Ambulatory Visit (INDEPENDENT_AMBULATORY_CARE_PROVIDER_SITE_OTHER): Payer: Medicare Other | Admitting: Family Medicine

## 2023-02-11 VITALS — BP 138/70 | HR 62 | Temp 97.6°F | Resp 18 | Ht 59.0 in | Wt 150.8 lb

## 2023-02-11 DIAGNOSIS — E785 Hyperlipidemia, unspecified: Secondary | ICD-10-CM | POA: Diagnosis not present

## 2023-02-11 DIAGNOSIS — M5442 Lumbago with sciatica, left side: Secondary | ICD-10-CM | POA: Diagnosis not present

## 2023-02-11 DIAGNOSIS — E1169 Type 2 diabetes mellitus with other specified complication: Secondary | ICD-10-CM

## 2023-02-11 DIAGNOSIS — G8929 Other chronic pain: Secondary | ICD-10-CM | POA: Diagnosis not present

## 2023-02-11 DIAGNOSIS — M159 Polyosteoarthritis, unspecified: Secondary | ICD-10-CM

## 2023-02-11 DIAGNOSIS — M25511 Pain in right shoulder: Secondary | ICD-10-CM

## 2023-02-11 MED ORDER — TRAMADOL HCL 50 MG PO TABS
50.0000 mg | ORAL_TABLET | Freq: Three times a day (TID) | ORAL | 2 refills | Status: DC
Start: 2023-03-07 — End: 2023-03-18

## 2023-02-11 MED ORDER — MOUNJARO 2.5 MG/0.5ML ~~LOC~~ SOAJ
2.5000 mg | SUBCUTANEOUS | 0 refills | Status: DC
Start: 2023-02-11 — End: 2023-06-16

## 2023-02-11 MED ORDER — GABAPENTIN 300 MG PO CAPS
300.0000 mg | ORAL_CAPSULE | Freq: Every day | ORAL | 0 refills | Status: DC
Start: 2023-02-11 — End: 2023-02-21

## 2023-02-11 NOTE — Patient Instructions (Addendum)
Thank you for coming to the office today.  I would recommend that you contact your Endocrinologist to ask for a switch of medication.  Recommend Mounjaro 5mg  weekly injection (instead of the previous Trulicity 3mg  weekly injection) since not available at pharmacy.  See if they can do an authorization to get you approved for [facility].  Restart Gabapentin 300mg  daily for 30 day trial. Let us know if want to purchase more.  Please schedule a Follow-up Appointment to: Return in about 4 months (around 06/14/2023) for 4 month follow-up DM, Pain updates.  If you have any other questions or concerns, please feel free to call the office or send a message through MyChart. You may also schedule an earlier appointment if necessary.  Additionally, you may be receiving a survey about your experience at our office within a few days to 1 week by e-mail or mail. We value your feedback.  Saralyn Pilar, DO [facility], [place]

## 2023-02-11 NOTE — Progress Notes (Unsigned)
Subjective:    Patient ID: Renee Boyd, female    DOB: 03/16/1951, 72 y.o.   MRN: [id number]  SHIFFON DUNLOP is a 72 y.o. female presenting on 02/11/2023 for Pain (Chronic pain neck and shoulder) and Diabetes (Pt reports she have been without her Trulicity x 3 weeks, because of the insulin shortage.  )   HPI  Followed by [facility] Not going to get another injection right away She said Right shoulder worsening after using it more due to problem with Left She will continue to work with Pain Management  Previously on Gabapentin 300mg  daily, request re order. To trial again. She stopped in the past.  Type 2 Diabetes On Trulicity 3mg  weekly inj On Jardiance 10mg  daily  Last A1c 7.0 (11/28/22)        02/11/2023    8:44 AM 12/31/2022    8:59 AM 09/26/2022    9:25 AM  Depression screen PHQ 2/9  Decreased Interest 2 0 0  Down, Depressed, Hopeless 0 0 0  PHQ - 2 Score 2 0 0  Altered sleeping 0    Tired, decreased energy 0    Change in appetite 0    Feeling bad or failure about yourself  0    Trouble concentrating 0    Moving slowly or fidgety/restless 0    Suicidal thoughts 0    PHQ-9 Score 2    Difficult doing work/chores Not difficult at all      Social History   Tobacco Use   Smoking status: Former    Packs/day: 2.00    Years: 40.00    Additional pack years: 0.00    Total pack years: 80.00    Types: Cigarettes    Quit date: 06/27/2005    Years since quitting: 17.6   Smokeless tobacco: Former   Tobacco comments:    quit Jun 27 2005  Vaping Use   Vaping Use: Never used  Substance Use Topics   Alcohol use: No   Drug use: No    Review of Systems Per HPI unless specifically indicated above     Objective:    BP 138/70 (BP Location: Right Arm, Patient Position: Sitting, Cuff Size: Large)   Pulse 62   Temp 97.6 F (36.4 C) (Oral)   Resp 18   Ht 4\' 11"  (1.499 m)   Wt 150 lb 12.8 oz (68.4 kg)   SpO2 96%   BMI 30.46 kg/m   Wt Readings from Last  3 Encounters:  02/11/23 150 lb 12.8 oz (68.4 kg)  01/15/23 150 lb (68 kg)  01/06/23 150 lb (68 kg)    Physical Exam Results for orders placed or performed in visit on 01/28/23  CUP PACEART REMOTE DEVICE CHECK  Result Value Ref Range   Date Time Interrogation Session [id number]    Pulse Generator Manufacturer MERM    Pulse Gen Model [id number] Azure XT DR MRI    Pulse Gen Serial Number [id number]    Clinic Name [facility]    Implantable Pulse Generator Type Implantable Pulse Generator    Implantable Pulse Generator Implant Date [id number]    Implantable Lead Manufacturer [facility]    Implantable Lead Model 5076 CapSureFix Novus MRI SureScan    Implantable Lead Serial Number [id number]    Implantable Lead Implant Date [id number]    Implantable Lead Location Detail 1 UNKNOWN    Implantable Lead Location [id number]    Implantable Lead Connection Status [id number]    Implantable Lead Manufacturer MERM  Implantable Lead Model 5076 CapSureFix Novus MRI SureScan    Implantable Lead Serial Number [id number]    Implantable Lead Implant Date [id number]    Implantable Lead Location Detail 1 UNKNOWN    Implantable Lead Location [id number]    Implantable Lead Connection Status 846962    Lead Channel Setting Sensing Sensitivity 2 mV   Lead Channel Setting Pacing Amplitude 1.5 V   Lead Channel Setting Pacing Pulse Width 0.4 ms   Lead Channel Setting Pacing Amplitude 2 V   Zone Setting Status 755011    Zone Setting Status Active    Lead Channel Impedance Value 399 ohm   Lead Channel Impedance Value 380 ohm   Lead Channel Sensing Intrinsic Amplitude 3.875 mV   Lead Channel Sensing Intrinsic Amplitude 3.875 mV   Lead Channel Pacing Threshold Amplitude 0.375 V   Lead Channel Pacing Threshold Pulse Width 0.4 ms   Lead Channel Impedance Value 589 ohm   Lead Channel Impedance Value 532 ohm   Lead Channel Sensing Intrinsic Amplitude 12.75 mV   Lead Channel Sensing Intrinsic Amplitude 12.75 mV   Lead Channel  Pacing Threshold Amplitude 0.625 V   Lead Channel Pacing Threshold Pulse Width 0.4 ms   Battery Status OK    Battery Remaining Longevity 132 mo   Battery Voltage 3.06 V   Brady Statistic RA Percent Paced 78.9 %   Brady Statistic RV Percent Paced 99.32 %   Brady Statistic AP VP Percent 94.17 %   Brady Statistic AS VP Percent 5.19 %   Brady Statistic AP VS Percent 0.18 %   Brady Statistic AS VS Percent 0.45 %      Assessment & Plan:   Problem List Items Addressed This Visit     Chronic left-sided low back pain with left-sided sciatica   Relevant Medications   traMADol (ULTRAM) 50 MG tablet (Start on 03/07/2023)   gabapentin (NEURONTIN) 300 MG capsule   Chronic right shoulder pain   Relevant Medications   traMADol (ULTRAM) 50 MG tablet (Start on 03/07/2023)   gabapentin (NEURONTIN) 300 MG capsule   Type 2 diabetes mellitus with hyperlipidemia (HCC) - Primary   Other Visit Diagnoses     Primary osteoarthritis involving multiple joints       Relevant Medications   traMADol (ULTRAM) 50 MG tablet (Start on 03/07/2023)       Meds ordered this encounter  Medications   traMADol (ULTRAM) 50 MG tablet    Sig: Take 1 tablet (50 mg total) by mouth 3 (three) times daily.    Dispense:  90 tablet    Refill:  2    Future refills on file. First fill 03/07/23   gabapentin (NEURONTIN) 300 MG capsule    Sig: Take 1 capsule (300 mg total) by mouth daily.    Dispense:  30 capsule    Refill:  0      Follow up plan: Return in about 4 months (around 06/14/2023) for 4 month follow-up DM, Pain updates.   Saralyn Pilar, DO [facility] 02/11/2023, 9:02 AM

## 2023-02-12 ENCOUNTER — Encounter: Payer: Self-pay | Admitting: Family Medicine

## 2023-02-17 ENCOUNTER — Other Ambulatory Visit: Payer: Self-pay | Admitting: Internal Medicine

## 2023-02-19 ENCOUNTER — Encounter: Payer: Self-pay | Admitting: Student in an Organized Health Care Education/Training Program

## 2023-02-19 ENCOUNTER — Ambulatory Visit
Payer: Medicare Other | Attending: Student in an Organized Health Care Education/Training Program | Admitting: Student in an Organized Health Care Education/Training Program

## 2023-02-19 VITALS — BP 166/75 | HR 74 | Temp 97.3°F | Resp 18 | Ht 59.0 in | Wt 150.0 lb

## 2023-02-19 DIAGNOSIS — M5412 Radiculopathy, cervical region: Secondary | ICD-10-CM | POA: Diagnosis not present

## 2023-02-19 DIAGNOSIS — G894 Chronic pain syndrome: Secondary | ICD-10-CM | POA: Diagnosis not present

## 2023-02-19 DIAGNOSIS — M4802 Spinal stenosis, cervical region: Secondary | ICD-10-CM | POA: Diagnosis not present

## 2023-02-19 NOTE — Progress Notes (Signed)
PROVIDER NOTE: Information contained herein reflects review and annotations entered in association with encounter. Interpretation of such information and data should be left to medically-trained personnel. Information provided to patient can be located elsewhere in the medical record under "Patient Instructions". Document created using STT-dictation technology, any transcriptional errors that may result from process are unintentional.    Patient: Renee Boyd  Service Category: E/M  Provider: Edward Jolly, MD  DOB: 04/26/1951  DOS: 02/19/2023  Referring Provider: Saralyn Pilar *  MRN: [id number]  Specialty: Interventional Pain Management  PCP: Smitty Cords, DO  Type: Established Patient  Setting: Ambulatory outpatient    Location: Office  Delivery: Face-to-face     HPI  Renee Boyd, a 71 y.o. year old female, is here today because of her Cervical stenosis of spinal canal [M48.02]. Renee Boyd primary complain today is Arm Pain (left)   Pain Assessment: Severity of Chronic pain is reported as a 3 /10. Location: Arm Left/sometimes radiates into forearm. Onset: More than a month ago. Quality: Aching, Nagging, Sore. Timing: Constant. Modifying factor(s): procedure rest. Vitals:  height is 4\' 11"  (1.499 m) and weight is 150 lb (68 kg). Her temperature is 97.3 F (36.3 C) (abnormal). Her blood pressure is 166/75 (abnormal) and her pulse is 74. Her respiration is 18 and oxygen saturation is 96%.  BMI: Estimated body mass index is 30.3 kg/m as calculated from the following:   Height as of this encounter: 4\' 11"  (1.499 m).   Weight as of this encounter: 150 lb (68 kg). Last encounter: 01/06/2023. Last procedure: 01/15/2023.  Reason for encounter: post-procedure evaluation and assessment.    Post-procedure evaluation   Procedure: Cervical Epidural Steroid injection (CESI) (Interlaminar) #2  Laterality: Left  Level: C7-T1 Imaging: Fluoroscopy-assisted DOS: 01/15/2023   Performed by: Edward Jolly, MD Anesthesia: Local anesthesia (1-2% Lidocaine) Anxiolysis:IV Versed 2 mg  Purpose: Diagnostic/Therapeutic Indications: Cervicalgia, cervical radicular pain, degenerative disc disease, severe enough to impact quality of life or function. 1. 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 Cervical radicular pain    NAS-11 score:   Pre-procedure: 7 /10   Post-procedure: 1 /10      Effectiveness:  Initial hour after procedure: 100 %  Subsequent 4-6 hours post-procedure: 100 %  Analgesia past initial 6 hours: 75 % (ongoing)  Ongoing improvement:  Analgesic:  75% Function: Renee Boyd reports improvement in function ROM: Renee Boyd reports improvement in ROM   ROS  Constitutional: Denies any fever or chills Gastrointestinal: No reported hemesis, hematochezia, vomiting, or acute GI distress Musculoskeletal: Denies any acute onset joint swelling, redness, loss of ROM, or weakness Neurological: No reported episodes of acute onset apraxia, aphasia, dysarthria, agnosia, amnesia, paralysis, loss of coordination, or loss of consciousness  Medication Review  Baclofen, Ipratropium-Albuterol, acetaminophen, allopurinol, amLODipine, apixaban, ascorbic acid, atorvastatin, desonide, empagliflozin, furosemide, gabapentin, glucose blood, levothyroxine, metoprolol succinate, multivitamin, omeprazole, temazepam, tirzepatide, and traMADol  History Review  Allergy: Renee Boyd is allergic to morphine and codeine, neosporin [bacitracin-polymyxin b], other, and percocet [oxycodone-acetaminophen]. Drug: Renee Boyd  reports no history of drug use. Alcohol:  reports no history of alcohol use. Tobacco:  reports that she quit smoking about 17 years ago. Her smoking use included cigarettes. She started smoking about 57 years ago. She has a 80 pack-year smoking history. She has quit using smokeless  tobacco. Social: Renee Boyd  reports that she quit smoking about  17 years ago. Her smoking use included cigarettes. She started smoking about 57 years ago. She has a 80 pack-year smoking history. She has quit using smokeless tobacco. She reports that she does not drink alcohol and does not use drugs. Medical:  has a past medical history of Complete heart block (HCC), Diabetes (HCC), GERD (gastroesophageal reflux disease), Hypertension, Hypothyroid, Pacemaker, Persistent atrial fibrillation (HCC), Pulmonary hypertension (HCC), and TIA (transient ischemic attack). Surgical: Renee Boyd  has a past surgical history that includes Abdominal hysterectomy; Cholecystectomy; Tonsillectomy; Tubal ligation; CARDIOVERSION (N/A, 05/28/2018); Cardioversion (N/A, 03/01/2020); Cardioversion (N/A, 10/25/2020); and PPM GENERATOR CHANGEOUT (N/A, 04/30/2022). Family: family history includes Alcohol abuse in her father; Autism in her son; Cancer in her son; Heart disease in her maternal grandmother and mother; Hypertension in an other family member; Miscarriages / [place] in her maternal aunt.  Laboratory Chemistry Profile   Renal Lab Results  Component Value Date   BUN 22 06/19/2022   CREATININE 1.59 (H) 06/19/2022   BCR 14 06/19/2022   GFRAA 23 (L) 02/25/2020   GFRNONAA 20 (L) 02/25/2020    Hepatic Lab Results  Component Value Date   AST 24 06/19/2022   ALT 21 06/19/2022   ALBUMIN 4.3 10/30/2021   ALKPHOS 88 10/30/2021   AMYLASE 132 (H) 10/30/2021   LIPASE 35 10/30/2021    Electrolytes Lab Results  Component Value Date   NA 142 06/19/2022   K 3.8 06/19/2022   CL 102 06/19/2022   CALCIUM 10.4 06/19/2022   MG 1.7 02/10/2016   PHOS 4.5 02/07/2016    Bone No results found for: "VD25OH", "VD125OH2TOT", "ZO1096EA5", "WU9811BJ4", "25OHVITD1", "25OHVITD2", "25OHVITD3", "TESTOFREE", "TESTOSTERONE"  Inflammation (CRP: Acute Phase) (ESR: Chronic Phase) Lab Results  Component Value Date   CRP 2.7 (H)  08/14/2019         Note: Above Lab results reviewed.  Recent Imaging Review  CUP PACEART REMOTE DEVICE CHECK Scheduled remote reviewed. Normal device function. 63 AHR totaling 17% of the time.  Overall increase in AF burden noted April through June 2024.  Longest AHR lasted 6 hr 51 min.  EGMs consistent with atrial flutter. Known history of atrial flutter, on  Eliquis per Epic. 4 VHR detections, longest lasted 17 beats.  Next remote 91 days. - C. Kavin Leech, RN, CVRS Note: Reviewed        Physical Exam  General appearance: Well nourished, well developed, and well hydrated. In no apparent acute distress Mental status: Alert, oriented x 3 (person, place, & time)       Respiratory: No evidence of acute respiratory distress Eyes: PERLA Vitals: BP (!) 166/75   Pulse 74   Temp (!) 97.3 F (36.3 C)   Resp 18   Ht 4\' 11"  (1.499 m)   Wt 150 lb (68 kg)   SpO2 96%   BMI 30.30 kg/m  BMI: Estimated body mass index is 30.3 kg/m as calculated from the following:   Height as of this encounter: 4\' 11"  (1.499 m).   Weight as of this encounter: 150 lb (68 kg). Ideal: Patient must be at least 60 in tall to calculate ideal body weight  Improvement in left neck and arm pain, improved left shoulder and cervical ROM.  Assessment   Diagnosis Status  1. 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 Cervical radicular pain   4. Chronic pain syndrome    Controlled Controlled Controlled  Plan of Care  75% pain relief after Left C-ESI. States that she can do ADLs more comfortably. States that she is more functional. Gabapentin initiated by PCP 300 mg BID which she states is also helping Follow up PRN   Follow-up plan:   Return if symptoms worsen or fail to improve.      R C7/T1 ESI 12/02/22; L C7/T1 ESI 01/15/23     Recent Visits Date Type Provider Dept  01/15/23 Procedure visit Edward Jolly, MD  [facility]  01/06/23 Office Visit Edward Jolly, MD [facility]  12/02/22 Procedure visit Edward Jolly, MD [facility]  Showing recent visits within past 90 days and meeting all other requirements Today's Visits Date Type Provider Dept  02/19/23 Office Visit Edward Jolly, MD [facility]  Showing today's visits and meeting all other requirements Future Appointments No visits were found meeting these conditions. Showing future appointments within next 90 days and meeting all other requirements  I discussed the assessment and treatment plan with the patient. The patient was provided an opportunity to ask questions and all were answered. The patient agreed with the plan and demonstrated an understanding of the instructions.  Patient advised to call back or seek an in-person evaluation if the symptoms or condition worsens.  Duration of encounter: .  Total time on encounter, as per AMA guidelines included both the face-to-face and non-face-to-face time personally spent by the physician and/or other qualified health care professional(s) on the day of the encounter (includes time in activities that require the physician or other qualified health care professional and does not include time in activities normally performed by clinical staff). Physician's time may include the following activities when performed: Preparing to see the patient (e.g., pre-charting review of records, searching for previously ordered imaging, lab work, and nerve conduction tests) Review of prior analgesic pharmacotherapies. Reviewing PMP Interpreting ordered tests (e.g., lab work, imaging, nerve conduction tests) Performing post-procedure evaluations, including interpretation of diagnostic procedures Obtaining and/or reviewing separately obtained history Performing a medically appropriate examination and/or evaluation Counseling and educating the  patient/family/caregiver Ordering medications, tests, or procedures Referring and communicating with other health care professionals (when not separately reported) Documenting clinical information in the electronic or other health record Independently interpreting results (not separately reported) and communicating results to the patient/ family/caregiver Care coordination (not separately reported)  Note by: Edward Jolly, MD Date: 02/19/2023; Time: 2:42 PM

## 2023-02-19 NOTE — Progress Notes (Signed)
Safety precautions to be maintained throughout the outpatient stay will include: orient to surroundings, keep bed in low position, maintain call bell within reach at all times, provide assistance with transfer out of bed and ambulation.  

## 2023-02-19 NOTE — Progress Notes (Signed)
 Remote pacemaker transmission.   

## 2023-02-21 ENCOUNTER — Telehealth: Payer: Self-pay | Admitting: Family Medicine

## 2023-02-21 DIAGNOSIS — G8929 Other chronic pain: Secondary | ICD-10-CM

## 2023-02-21 MED ORDER — GABAPENTIN 300 MG PO CAPS
300.0000 mg | ORAL_CAPSULE | Freq: Every day | ORAL | 0 refills | Status: DC
Start: 2023-02-21 — End: 2023-03-06

## 2023-02-21 NOTE — Telephone Encounter (Signed)
Gabapentin refill sent.  Attempted to contact patient but no answer or voicemail.

## 2023-02-21 NOTE — Telephone Encounter (Signed)
Pt is calling to report that she is in agreement for gabapentin (NEURONTIN) 300 MG capsule [[id number]] 90 day  to script to be sent to  [facility] - [place], [place] - [address] Phone: [phone]  Fax: [phone]    Pt reports that the medication is working well. And it will take about 2 weeks to arrive with mail order. Please advise

## 2023-03-06 ENCOUNTER — Other Ambulatory Visit: Payer: Self-pay | Admitting: Family Medicine

## 2023-03-06 DIAGNOSIS — G8929 Other chronic pain: Secondary | ICD-10-CM

## 2023-03-06 DIAGNOSIS — F5101 Primary insomnia: Secondary | ICD-10-CM

## 2023-03-06 MED ORDER — GABAPENTIN 300 MG PO CAPS
300.0000 mg | ORAL_CAPSULE | Freq: Every day | ORAL | 1 refills | Status: DC
Start: 2023-03-06 — End: 2023-03-18

## 2023-03-06 NOTE — Telephone Encounter (Signed)
Medication Refill - Medication: gabapentin (NEURONTIN) 300 MG capsule   Has the patient contacted their pharmacy? No.  Preferred Pharmacy (with phone number or street name):  [facility] - [place], [place] - [address] Phone: [phone]  Fax: [phone]     Has the patient been seen for an appointment in the last year OR does the patient have an upcoming appointment? Yes.    Agent: Please be advised that RX refills may take up to 3 business days. We ask that you follow-up with your pharmacy.  Patient is requesting a 90 day supply as it is better for her with insurance and because it takes more than 2 weeks for her to get the medication.

## 2023-03-07 NOTE — Telephone Encounter (Signed)
Requested medication (s) are due for refill today - yes  Requested medication (s) are on the active medication list -yes  Future visit scheduled -yes  Last refill: 11/07/22 #30 2RF  Notes to clinic: non delegated Rx  Requested Prescriptions  Pending Prescriptions Disp Refills   temazepam (RESTORIL) 30 MG capsule [Pharmacy Med Name: TEMAZEPAM 30 MG CAP] 30 capsule     Sig: TAKE 1 CAPSULE BY MOUTH AT BEDTIME AS NEEDED SLEEP     Not Delegated - Psychiatry: Anxiolytics/Hypnotics 2 Failed - 03/06/2023  1:16 PM      Failed - This refill cannot be delegated      Failed - Urine Drug Screen completed in last 360 days      Passed - Patient is not pregnant      Passed - Valid encounter within last 6 months    Recent Outpatient Visits           3 weeks ago Type 2 diabetes mellitus with hyperlipidemia [facility])   [place] [facility] [place], Renee Neat, Renee Boyd   2 months ago Chronic right shoulder pain   [place] [facility], Renee Neat, Renee Boyd   8 months ago Type 2 diabetes mellitus with hyperlipidemia [facility])   [place] [facility] Smitty Cords, Renee Boyd   11 months ago Chronic right shoulder pain   [place] [facility], Renee Neat, Renee Boyd   1 year ago Type 2 diabetes mellitus with hyperlipidemia [facility])   [place] [facility] Renee Boyd, Renee Neat, Renee Boyd       Future Appointments             In 3 months Renee Boyd, Renee Neat, Renee Boyd [facility], [facility]               Requested Prescriptions  Pending Prescriptions Disp Refills   temazepam (RESTORIL) 30 MG capsule [Pharmacy Med Name: TEMAZEPAM 30 MG CAP] 30 capsule     Sig: TAKE 1 CAPSULE BY MOUTH AT BEDTIME AS NEEDED SLEEP     Not Delegated - Psychiatry: Anxiolytics/Hypnotics 2 Failed - 03/06/2023  1:16 PM      Failed - This refill cannot be delegated      Failed - Urine Drug Screen  completed in last 360 days      Passed - Patient is not pregnant      Passed - Valid encounter within last 6 months    Recent Outpatient Visits           3 weeks ago Type 2 diabetes mellitus with hyperlipidemia [facility])   [place] [facility] [place], Renee Neat, Renee Boyd   2 months ago Chronic right shoulder pain   [place] [facility] [place], Renee Neat, Renee Boyd   8 months ago Type 2 diabetes mellitus with hyperlipidemia [facility])   [place] [facility] Smitty Cords, Renee Boyd   11 months ago Chronic right shoulder pain   [place] [facility] [place], Renee Neat, Renee Boyd   1 year ago Type 2 diabetes mellitus with hyperlipidemia [facility])   [place] [facility] Renee Boyd, Renee Neat, Renee Boyd       Future Appointments             In 3 months Renee Boyd, Renee Neat, Renee Boyd [place] [facility], [facility]

## 2023-03-10 ENCOUNTER — Telehealth: Payer: Self-pay | Admitting: Family Medicine

## 2023-03-10 NOTE — Telephone Encounter (Signed)
Medication Refill - Medication:   temazepam (RESTORIL) 30 MG capsule  Pt states that she is out of medication. Medication was requested on 03/06/2023. Did advise that it could take up to 3 business days for the refill. Pt states that she has been having issues with the pharmacy.    Has the patient contacted their pharmacy? Yes.    Preferred Pharmacy (with phone number or street name): [facility] [place], [place] - [address]  Phone: [phone] Fax: [phone]  Has the patient been seen for an appointment in the last year OR does the patient have an upcoming appointment? Yes.    Agent: Please be advised that RX refills may take up to 3 business days. We ask that you follow-up with your pharmacy.

## 2023-03-10 NOTE — Telephone Encounter (Signed)
Sent in today by provider in a separate encounter.

## 2023-03-12 ENCOUNTER — Ambulatory Visit: Payer: Self-pay

## 2023-03-12 DIAGNOSIS — G8929 Other chronic pain: Secondary | ICD-10-CM

## 2023-03-12 NOTE — Telephone Encounter (Signed)
Patient called and stated she received her medication for Gabapentin today, 03/12/2023 but stated it is only 90 pills. She states this is a new prescription. She states it says take 1 tablet daily but she states she thinks she is supposed to take 2 daily. She stated Dr Kirtland Bouchard and her agreed on this, please advise.   Chief Complaint: Pt. Reports she has been taking Gabapentin 1 pill twice a day since July "by mistake." "It has really helped me." Asking if she can stay on this dose. Symptoms: n/a Frequency: July Pertinent Negatives: Patient denies  Disposition: [] ED /[] Urgent Care (no appt availability in office) / [] Appointment(In office/virtual)/ []  [place] [facility]/ [] Home Care/ [] Refused Recommended Disposition /[] [place] [facility]/ [x]  Follow-up with PCP Additional Notes: Pt. Requesting to stay on gabapentin 2 pills a day. Please advise pt.  Reason for Disposition  [1] Caller has URGENT medicine question about med that PCP or specialist prescribed AND [2] triager unable to answer question  Answer Assessment - Initial Assessment Questions 1. NAME of MEDICINE: "What medicine(s) are you calling about?"     Gabapentin  2. QUESTION: "What is your question?" (e.g., double dose of medicine, side effect)     Pt. Has been taking 1 pill twice a day 3. PRESCRIBER: "Who prescribed the medicine?" Reason: if prescribed by specialist, call should be referred to that group.     Dr. Althea Charon 4. SYMPTOMS: "Do you have any symptoms?" If Yes, ask: "What symptoms are you having?"  "How bad are the symptoms (e.g., mild, moderate, severe)     N/a 5. PREGNANCY:  "Is there any chance that you are pregnant?" "When was your last menstrual period?"     No  Protocols used: Medication Question Call-A-AH

## 2023-03-13 NOTE — Telephone Encounter (Signed)
His last note from 02/2023 reported that she had been out of gabapentin and was previously taking this once daily.  There is no mention of her taking it twice daily.  Will leave it as currently prescribed and she can follow-up with PCP upon his return about this.

## 2023-03-13 NOTE — Telephone Encounter (Signed)
Pt advised.  She is okay with waiting until Dr. Althea Charon returns.   Thanks,   -Vernona Rieger

## 2023-03-18 ENCOUNTER — Other Ambulatory Visit: Payer: Self-pay

## 2023-03-18 DIAGNOSIS — G8929 Other chronic pain: Secondary | ICD-10-CM

## 2023-03-18 DIAGNOSIS — F5101 Primary insomnia: Secondary | ICD-10-CM

## 2023-03-18 DIAGNOSIS — M159 Polyosteoarthritis, unspecified: Secondary | ICD-10-CM

## 2023-03-18 DIAGNOSIS — R0609 Other forms of dyspnea: Secondary | ICD-10-CM

## 2023-03-18 DIAGNOSIS — I4819 Other persistent atrial fibrillation: Secondary | ICD-10-CM

## 2023-03-18 MED ORDER — GABAPENTIN 300 MG PO CAPS
300.0000 mg | ORAL_CAPSULE | Freq: Two times a day (BID) | ORAL | 1 refills | Status: DC
Start: 2023-03-18 — End: 2023-10-07

## 2023-03-18 MED ORDER — COMBIVENT RESPIMAT 20-100 MCG/ACT IN AERS
INHALATION_SPRAY | RESPIRATORY_TRACT | 2 refills | Status: DC
Start: 2023-03-18 — End: 2024-01-12

## 2023-03-18 MED ORDER — TRAMADOL HCL 50 MG PO TABS
50.0000 mg | ORAL_TABLET | Freq: Three times a day (TID) | ORAL | 2 refills | Status: DC
Start: 2023-03-18 — End: 2023-05-12

## 2023-03-18 MED ORDER — APIXABAN 5 MG PO TABS
5.0000 mg | ORAL_TABLET | Freq: Two times a day (BID) | ORAL | 3 refills | Status: DC
Start: 2023-03-18 — End: 2024-04-06

## 2023-03-18 MED ORDER — TEMAZEPAM 30 MG PO CAPS
30.0000 mg | ORAL_CAPSULE | Freq: Every day | ORAL | 2 refills | Status: DC
Start: 2023-03-18 — End: 2023-05-12

## 2023-03-18 NOTE — Telephone Encounter (Signed)
New rx Gabapentin 300mg  twice a day ordered for 180 pills sent to [facility]  Saralyn Pilar, DO [facility] 03/18/2023, 12:42 PM

## 2023-03-18 NOTE — Addendum Note (Signed)
Addended by: Smitty Cords on: 03/18/2023 12:43 PM   Modules accepted: Orders

## 2023-03-18 NOTE — Telephone Encounter (Signed)
[facility]  Please refill.

## 2023-03-18 NOTE — Telephone Encounter (Signed)
Prescription refill request for Eliquis received. Indication:afib Last office visit:1/24 Scr:1.3  4/24 Age: 72 Weight:68  kg  Prescription refilled

## 2023-03-18 NOTE — Telephone Encounter (Signed)
Pt called back to report that the "drug that had so much of a problem was M[facility]. [facility]."

## 2023-03-20 ENCOUNTER — Telehealth: Payer: Self-pay | Admitting: Family Medicine

## 2023-03-20 NOTE — Telephone Encounter (Signed)
Ella from [facility] called to request information about how the patient is being treated and the drug interaction/side effects of gabapentin (NEURONTIN) 300 MG capsule, temazepam (RESTORIL) 30 MG capsule, traMADol (ULTRAM) 50 MG table and Baclofen 5 MG TABS. Please f/u with Samson Frederic at [phone] UJW#[phone]

## 2023-03-20 NOTE — Telephone Encounter (Signed)
Called [facility] back and reviewed all of these medications and answered their questions.  Saralyn Pilar, DO [facility] 03/20/2023, 12:35 PM

## 2023-03-28 ENCOUNTER — Ambulatory Visit (INDEPENDENT_AMBULATORY_CARE_PROVIDER_SITE_OTHER): Payer: Medicare Other

## 2023-03-28 DIAGNOSIS — Z0000 Encounter for general adult medical examination without abnormal findings: Secondary | ICD-10-CM | POA: Diagnosis not present

## 2023-03-28 NOTE — Patient Instructions (Addendum)
Renee Boyd , Thank you for taking time to come for your Medicare Wellness Visit. I appreciate your ongoing commitment to your health goals. Please review the following plan we discussed and let me know if I can assist you in the future.   Referrals/Orders/Follow-Ups/Clinician Recommendations: none  This is a list of the screening recommended for you and due dates:  Health Maintenance  Topic Date Due   Zoster (Shingles) Vaccine (1 of 2) Never done   Mammogram  Never done   Yearly kidney health urinalysis for diabetes  05/13/2017   COVID-19 Vaccine (6 - 2023-24 season) 04/05/2022   Complete foot exam   06/27/2022   Hemoglobin A1C  12/18/2022   Flu Shot  03/06/2023   Eye exam for diabetics  05/11/2023   Yearly kidney function blood test for diabetes  06/20/2023   Medicare Annual Wellness Visit  03/27/2024   Colon Cancer Screening  07/05/2024   DTaP/Tdap/Td vaccine (2 - Td or Tdap) 04/05/2025   Pneumonia Vaccine  Completed   DEXA scan (bone density measurement)  Completed   Hepatitis C Screening  Completed   HPV Vaccine  Aged Out    Advanced directives: (In Chart) A copy of your advanced directives are scanned into your chart should your provider ever need it.  Next Medicare Annual Wellness Visit scheduled for next year: Yes    04/02/24 @ 3:00 pm by phone

## 2023-03-28 NOTE — Progress Notes (Signed)
Subjective:   Renee Boyd is a 72 y.o. female who presents for Medicare Annual (Subsequent) preventive examination.  Visit Complete: Virtual  I connected with  Renee Boyd on 03/28/23 by a audio enabled telemedicine application and verified that I am speaking with the correct person using two identifiers.  Patient Location: Home  Provider Location: Office/Clinic  I discussed the limitations of evaluation and management by telemedicine. The patient expressed understanding and agreed to proceed.  Vital Signs: Unable to obtain new vitals due to this being a telehealth visit.  Review of Systems     Cardiac Risk Factors include: advanced age (>11men, >76 women);hypertension;diabetes mellitus;obesity (BMI >30kg/m2);sedentary lifestyle     Objective:    Today's Vitals   03/28/23 1513  PainSc: 4    There is no height or weight on file to calculate BMI.     03/28/2023    3:24 PM 02/19/2023    2:18 PM 01/15/2023    9:42 AM 01/06/2023    2:55 PM 09/26/2022    9:25 AM 08/14/2022    8:12 AM 04/30/2022    9:12 AM  Advanced Directives  Does Patient Have a Medical Advance Directive? Yes Yes Yes Yes Yes Yes Yes  Type of Estate agent of [place];Living will     Healthcare Power of [place];Living will Healthcare Power of [place];Living will  Does patient want to make changes to medical advance directive? No - Patient declined   No - Patient declined     Copy of Healthcare Power of Attorney in Chart? Yes - validated most recent copy scanned in chart (See row information)          Current Medications (verified) Outpatient Encounter Medications as of 03/28/2023  Medication Sig   acetaminophen (TYLENOL) 500 MG tablet Take 1,000 mg by mouth every 8 (eight) hours as needed.   allopurinol (ZYLOPRIM) 100 MG tablet Take 1 tablet (100 mg total) by mouth daily.   amLODipine (NORVASC) 10 MG tablet TAKE 1 TABLET DAILY   apixaban (ELIQUIS) 5 MG TABS tablet Take 1 tablet (5 mg  total) by mouth 2 (two) times daily.   ascorbic acid (VITAMIN C) 1000 MG tablet Take 1,000 mg by mouth daily.   atorvastatin (LIPITOR) 20 MG tablet TAKE 1 TABLET DAILY   Baclofen 5 MG TABS Take 1 tablet (5 mg total) by mouth at bedtime.   COMBIVENT RESPIMAT 20-100 MCG/ACT AERS respimat INHALE 1 PUFF EVERY 6 HOURS AS NEEDED FOR WHEEZING/SOB   CONTOUR NEXT TEST test strip Use to check blood sugar up to twice a day.   desonide (DESOWEN) 0.05 % lotion Apply topically 2 (two) times daily.   furosemide (LASIX) 20 MG tablet Take 3 tablets (60 mg total) by mouth daily. TAKE 3 TABLETS ONCE DAILY (DOSE INCREASE)   gabapentin (NEURONTIN) 300 MG capsule Take 1 capsule (300 mg total) by mouth 2 (two) times daily.   JARDIANCE 10 MG TABS tablet Take 10 mg by mouth daily.   levothyroxine (SYNTHROID) 25 MCG tablet Take 1 tablet (25 mcg total) by mouth daily before breakfast.   metoprolol succinate (TOPROL-XL) 50 MG 24 hr tablet Take 1 tablet (50 mg total) by mouth daily. Take with or immediately following a meal.   MOUNJARO 2.5 MG/0.5ML Pen Inject 2.5 mg into the skin once a week.   Multiple Vitamin (MULTIVITAMIN) capsule Take 1 capsule by mouth daily.   omeprazole (PRILOSEC) 20 MG capsule TAKE 1 CAPSULE TWICE A DAY BEFORE MEALS  temazepam (RESTORIL) 30 MG capsule Take 1 capsule (30 mg total) by mouth at bedtime.   traMADol (ULTRAM) 50 MG tablet Take 1 tablet (50 mg total) by mouth 3 (three) times daily.   No facility-administered encounter medications on file as of 03/28/2023.    Allergies (verified) Morphine and codeine, Neosporin [bacitracin-polymyxin b], Other, and Percocet [oxycodone-acetaminophen]   History: Past Medical History:  Diagnosis Date   Complete heart block (HCC)    a. s/p MDT PPM 2006 with device generator replacement 2014; b. followed by Dr. Graciela Husbands   Diabetes [facility])    GERD (gastroesophageal reflux disease)    Hypertension    Hypothyroid    Pacemaker    Persistent atrial  fibrillation (HCC)    a. on Eliquis; b. CHADS2VASc 6 (HTN. age x 1, DM, TIA x 2, female); c. s/p DCCV 05/28/18   Pulmonary hypertension (HCC)    a. echo 2017: EF of 60-65%, no RWMA, mildly dilated left atrium, RVSF normal, PASP 64 mmHg   TIA (transient ischemic attack)    Past Surgical History:  Procedure Laterality Date   ABDOMINAL HYSTERECTOMY     CARDIOVERSION N/A 05/28/2018   Procedure: CARDIOVERSION;  Surgeon: Antonieta Iba, MD;  Location: [facility] ORS;  Service: Cardiovascular;  Laterality: N/A;   CARDIOVERSION N/A 03/01/2020   Procedure: CARDIOVERSION;  Surgeon: Debbe Odea, MD;  Location: [facility] ORS;  Service: Cardiovascular;  Laterality: N/A;   CARDIOVERSION N/A 10/25/2020   Procedure: CARDIOVERSION;  Surgeon: Debbe Odea, MD;  Location: [facility] ORS;  Service: Cardiovascular;  Laterality: N/A;   CHOLECYSTECTOMY     PPM GENERATOR CHANGEOUT N/A 04/30/2022   Procedure: PPM GENERATOR CHANGEOUT;  Surgeon: Duke Salvia, MD;  Location: [facility] INVASIVE CV LAB;  Service: Cardiovascular;  Laterality: N/A;   TONSILLECTOMY     TUBAL LIGATION     Family History  Problem Relation Age of Onset   Hypertension Other    Heart disease Mother        MI   Alcohol abuse Father    Autism Son    Heart disease Maternal Grandmother    Cancer Son        colon   Miscarriages / Stillbirths Maternal Aunt    Social History   Socioeconomic History   Marital status: Divorced    Spouse name: Not on file   Number of children: 3   Years of education: Not on file   Highest education level: High school graduate  Occupational History   Occupation: retired  Tobacco Use   Smoking status: Former    Current packs/day: 0.00    Average packs/day: 2.0 packs/day for 40.0 years (80.0 ttl pk-yrs)    Types: Cigarettes    Start date: 06/27/1965    Quit date: 06/27/2005    Years since quitting: 17.7   Smokeless tobacco: Former   Tobacco comments:    quit Jun 27 2005  Vaping Use   Vaping status:  Never Used  Substance and Sexual Activity   Alcohol use: No   Drug use: No   Sexual activity: Not Currently  Other Topics Concern   Not on file  Social History Narrative   Lives by self.   Social Determinants of Health   Financial Resource Strain: Low Risk  (03/28/2023)   Overall Financial Resource Strain (CARDIA)    Difficulty of Paying Living Expenses: Not hard at all  Food Insecurity: No Food Insecurity (03/28/2023)   Hunger Vital Sign    Worried About Running Out of Food  in the Last Year: Never true    Ran Out of Food in the Last Year: Never true  Transportation Needs: No Transportation Needs (03/28/2023)   PRAPARE - Administrator, Civil Service (Medical): No    Lack of Transportation (Non-Medical): No  Physical Activity: Insufficiently Active (03/28/2023)   Exercise Vital Sign    Days of Exercise per Week: 3 days    Minutes of Exercise per Session: 30 min  Stress: No Stress Concern Present (03/28/2023)   [employer] of Occupational Health - Occupational Stress Questionnaire    Feeling of Stress : Not at all  Social Connections: Moderately Isolated (03/28/2023)   Social Connection and Isolation Panel [NHANES]    Frequency of Communication with Friends and Family: More than three times a week    Frequency of Social Gatherings with Friends and Family: More than three times a week    Attends Religious Services: More than 4 times per year    Active Member of [employer] or Organizations: No    Attends Engineer, structural: Never    Marital Status: Divorced    Tobacco Counseling Counseling given: Not Answered Tobacco comments: quit Jun 27 2005   Clinical Intake:  Pre-visit preparation completed: Yes  Pain : 0-10 Pain Score: 4  Pain Type: Chronic pain Pain Location: Arm Pain Orientation: Right Pain Descriptors / Indicators: Stabbing, Cramping, Jabbing Pain Onset: More than a month ago Pain Frequency: Intermittent Pain Relieving Factors:  shot  Pain Relieving Factors: shot  Nutritional Risks: None Diabetes: Yes CBG done?: No Did pt. bring in CBG monitor from home?: No  How often do you need to have someone help you when you read instructions, pamphlets, or other written materials from your doctor or pharmacy?: 1 - Never  Interpreter Needed?: No  Information entered by :: Kennedy Bucker, LPN   Activities of Daily Living    03/28/2023    3:25 PM 02/11/2023    8:44 AM  In your present state of health, do you have any difficulty performing the following activities:  Hearing? 0 0  Vision? 0 0  Difficulty concentrating or making decisions? 0 0  Walking or climbing stairs? 0 0  Dressing or bathing? 0 0  Doing errands, shopping? 0 0  Preparing Food and eating ? N   Using the Toilet? N   In the past six months, have you accidently leaked urine? N   Do you have problems with loss of bowel control? N   Managing your Medications? N   Managing your Finances? N   Housekeeping or managing your Housekeeping? N     Patient Care Team: Smitty Cords, DO as PCP - General (Family Medicine) Lamont Dowdy, MD as Consulting Physician (Nephrology) Duke Salvia, MD as Consulting Physician (Cardiology) Felecia Shelling, DPM as Consulting Physician (Podiatry)  Indicate any recent Medical Services you may have received from other than [facility] providers in the past year (date may be approximate).     Assessment:   This is a routine wellness examination for Tomasa.  Hearing/Vision screen Hearing Screening - Comments:: No aids Vision Screening - Comments:: Wears glasses- Dr.Brasington  Dietary issues and exercise activities discussed:     Goals Addressed             This Visit's Progress    DIET - EAT MORE FRUITS AND VEGETABLES         Depression Screen    03/28/2023    3:21 PM 02/19/2023  2:18 PM 02/11/2023    8:44 AM 12/31/2022    8:59 AM 09/26/2022    9:25 AM 08/14/2022    8:11 AM 06/26/2022   11:41 AM   PHQ 2/9 Scores  PHQ - 2 Score 2 0 2 0 0 0 1  PHQ- 9 Score 3  2    1     Fall Risk    03/28/2023    3:25 PM 02/19/2023    2:17 PM 02/11/2023    8:45 AM 01/15/2023    9:42 AM 01/06/2023    2:55 PM  Fall Risk   Falls in the past year? 0 0 0 0 0  Number falls in past yr: 0  0    Injury with Fall? 0  0    Risk for fall due to : No Fall Risks  No Fall Risks    Follow up Falls prevention discussed;Falls evaluation completed  Falls evaluation completed      MEDICARE RISK AT HOME: Medicare Risk at Home Any stairs in or around the home?: No If so, are there any without handrails?: No Home free of loose throw rugs in walkways, pet beds, electrical cords, etc?: Yes Adequate lighting in your home to reduce risk of falls?: Yes Life alert?: No Use of a cane, walker or w/c?: No Grab bars in the bathroom?: No Shower chair or bench in shower?: No Elevated toilet seat or a handicapped toilet?: No  TIMED UP AND GO:  Was the test performed?  No    Cognitive Function:        03/28/2023    3:30 PM 03/25/2022    2:24 PM 03/20/2021   11:23 AM 03/14/2020    2:29 PM 02/09/2019    9:01 AM  6CIT Screen  What Year? 0 points 0 points 0 points 0 points 0 points  What month? 0 points 0 points 0 points 0 points 0 points  What time? 0 points 0 points 0 points 0 points 0 points  Count back from 20 0 points 0 points 0 points 2 points 0 points  Months in reverse 0 points 0 points 0 points 0 points 0 points  Repeat phrase 0 points 0 points 2 points 2 points 0 points  Total Score 0 points 0 points 2 points 4 points 0 points    Immunizations Immunization History  Administered Date(s) Administered   PFIZER Comirnaty(Gray Top)Covid-19 Tri-Sucrose Vaccine 10/18/2019, 11/09/2019   PFIZER(Purple Top)SARS-COV-2 Vaccination 10/18/2019, 11/09/2019, 07/11/2020   Pneumococcal Conjugate-13 09/10/2016   Pneumococcal Polysaccharide-23 09/16/2017   Tdap 04/06/2015    TDAP status: Up to date  Flu Vaccine status:  Declined, Education has been provided regarding the importance of this vaccine but patient still declined. Advised may receive this vaccine at local pharmacy or Health Dept. Aware to provide a copy of the vaccination record if obtained from local pharmacy or Health Dept. Verbalized acceptance and understanding.  Pneumococcal vaccine status: Up to date  Covid-19 vaccine status: Completed vaccines  Qualifies for Shingles Vaccine? Yes   Zostavax completed No   Shingrix Completed?: No.    Education has been provided regarding the importance of this vaccine. Patient has been advised to call insurance company to determine out of pocket expense if they have not yet received this vaccine. Advised may also receive vaccine at local pharmacy or Health Dept. Verbalized acceptance and understanding.  Screening Tests Health Maintenance  Topic Date Due   Zoster Vaccines- Shingrix (1 of 2) Never done   MAMMOGRAM  Never  done   Diabetic kidney evaluation - Urine ACR  05/13/2017   COVID-19 Vaccine (6 - 2023-24 season) 04/05/2022   FOOT EXAM  06/27/2022   HEMOGLOBIN A1C  12/18/2022   INFLUENZA VACCINE  03/06/2023   OPHTHALMOLOGY EXAM  05/11/2023   Diabetic kidney evaluation - eGFR measurement  06/20/2023   Medicare Annual Wellness (AWV)  03/27/2024   Colonoscopy  07/05/2024   DTaP/Tdap/Td (2 - Td or Tdap) 04/05/2025   Pneumonia Vaccine 62+ Years old  Completed   DEXA SCAN  Completed   Hepatitis C Screening  Completed   HPV VACCINES  Aged Out    Health Maintenance  Health Maintenance Due  Topic Date Due   Zoster Vaccines- Shingrix (1 of 2) Never done   MAMMOGRAM  Never done   Diabetic kidney evaluation - Urine ACR  05/13/2017   COVID-19 Vaccine (6 - 2023-24 season) 04/05/2022   FOOT EXAM  06/27/2022   HEMOGLOBIN A1C  12/18/2022   INFLUENZA VACCINE  03/06/2023    Colorectal cancer screening: Type of screening: Colonoscopy. Completed 07/05/14. Repeat every 10 years  Declined referral for  mammogram  Bone Density status: Completed 01/13/17. Results reflect: Bone density results: OSTEOPOROSIS. Repeat every 2 years.- declined referral  Lung Cancer Screening: (Low Dose CT Chest recommended if Age 38-80 years, 20 pack-year currently smoking OR have quit w/in 15years.) does not qualify.    Additional Screening:  Hepatitis C Screening: does qualify; Completed 09/10/16  Vision Screening: Recommended annual ophthalmology exams for early detection of glaucoma and other disorders of the eye. Is the patient up to date with their annual eye exam?  Yes  Who is the provider or what is the name of the office in which the patient attends annual eye exams? Dr.Woodard If pt is not established with a provider, would they like to be referred to a provider to establish care? No .   Dental Screening: Recommended annual dental exams for proper oral hygiene  Diabetic Foot Exam: Diabetic Foot Exam: Completed 06/27/21  Community Resource Referral / Chronic Care Management: CRR required this visit?  No   CCM required this visit?  No     Plan:     I have personally reviewed and noted the following in the patient's chart:   Medical and social history Use of alcohol, tobacco or illicit drugs  Current medications and supplements including opioid prescriptions. Patient is not currently taking opioid prescriptions. Functional ability and status Nutritional status Physical activity Advanced directives List of other physicians Hospitalizations, surgeries, and ER visits in previous 12 months Vitals Screenings to include cognitive, depression, and falls Referrals and appointments  In addition, I have reviewed and discussed with patient certain preventive protocols, quality metrics, and best practice recommendations. A written personalized care plan for preventive services as well as general preventive health recommendations were provided to patient.     Hal Hope, LPN   6/96/2952    After Visit Summary: (MyChart) Due to this being a telephonic visit, the after visit summary with patients personalized plan was offered to patient via MyChart   Nurse Notes: none

## 2023-03-31 ENCOUNTER — Other Ambulatory Visit: Payer: Self-pay | Admitting: Family Medicine

## 2023-03-31 DIAGNOSIS — E785 Hyperlipidemia, unspecified: Secondary | ICD-10-CM

## 2023-04-01 NOTE — Telephone Encounter (Signed)
Requested medications are due for refill today.  yes  Requested medications are on the active medications list.  yes  Last refill. 04/05/2022 #90 3 rf  Future visit scheduled.   yes  Notes to clinic.  Labs are expired.    Requested Prescriptions  Pending Prescriptions Disp Refills   allopurinol (ZYLOPRIM) 100 MG tablet [Pharmacy Med Name: ALLOPURINOL TABS 100MG ] 90 tablet 3    Sig: TAKE 1 TABLET DAILY     Endocrinology:  Gout Agents - allopurinol Failed - 03/31/2023  1:38 AM      Failed - Uric Acid in normal range and within 360 days    Uric Acid, Serum  Date Value Ref Range Status  03/23/2018 5.8 2.5 - 7.0 mg/dL Final    Comment:    Therapeutic target for gout patients: <6.0 mg/dL .    Uric Acid  Date Value Ref Range Status  11/11/2016 9.7 (H) 2.5 - 7.1 mg/dL Final    Comment:               Therapeutic target for gout patients: <6.0         Failed - Cr in normal range and within 360 days    Creat  Date Value Ref Range Status  06/19/2022 1.59 (H) 0.60 - 1.00 mg/dL Final         Passed - Valid encounter within last 12 months    Recent Outpatient Visits           1 month ago Type 2 diabetes mellitus with hyperlipidemia [facility])   [place] [facility] [place], Netta Neat, DO   3 months ago Chronic right shoulder pain   [place] [facility] Smitty Cords, DO   9 months ago Type 2 diabetes mellitus with hyperlipidemia [facility])   [place] [facility] Smitty Cords, DO   1 year ago Chronic right shoulder pain   [place] [facility] Smitty Cords, DO   1 year ago Type 2 diabetes mellitus with hyperlipidemia [facility])   [place] [facility] Althea Charon, Netta Neat, DO       Future Appointments             In 2 months Althea Charon, Netta Neat, DO  [facility]            Passed - CBC within normal limits and  completed in the last 12 months    WBC  Date Value Ref Range Status  06/19/2022 4.1 3.8 - 10.8 Thousand/uL Final   RBC  Date Value Ref Range Status  06/19/2022 4.72 3.80 - 5.10 Million/uL Final   Hemoglobin  Date Value Ref Range Status  06/19/2022 14.1 11.7 - 15.5 g/dL Final  91/47/8295 62.1 11.1 - 15.9 g/dL Final   HCT  Date Value Ref Range Status  06/19/2022 41.8 35.0 - 45.0 % Final   Hematocrit  Date Value Ref Range Status  05/27/2018 35.3 34.0 - 46.6 % Final   MCHC  Date Value Ref Range Status  06/19/2022 33.7 32.0 - 36.0 g/dL Final   [facility]  Date Value Ref Range Status  06/19/2022 29.9 27.0 - 33.0 pg Final   MCV  Date Value Ref Range Status  06/19/2022 88.6 80.0 - 100.0 fL Final  05/27/2018 88 79 - 97 fL Final  07/13/2013 89 80 - 100 fL Final   No results found for: "PLTCOUNTKUC", "LABPLAT", "POCPLA" RDW  Date  Value Ref Range Status  06/19/2022 14.2 11.0 - 15.0 % Final  05/27/2018 15.0 12.3 - 15.4 % Final  07/13/2013 14.6 (H) 11.5 - 14.5 % Final

## 2023-04-03 DIAGNOSIS — R809 Proteinuria, unspecified: Secondary | ICD-10-CM | POA: Diagnosis not present

## 2023-04-03 DIAGNOSIS — N2581 Secondary hyperparathyroidism of renal origin: Secondary | ICD-10-CM | POA: Diagnosis not present

## 2023-04-03 DIAGNOSIS — I129 Hypertensive chronic kidney disease with stage 1 through stage 4 chronic kidney disease, or unspecified chronic kidney disease: Secondary | ICD-10-CM | POA: Diagnosis not present

## 2023-04-03 DIAGNOSIS — E875 Hyperkalemia: Secondary | ICD-10-CM | POA: Diagnosis not present

## 2023-04-03 DIAGNOSIS — E1122 Type 2 diabetes mellitus with diabetic chronic kidney disease: Secondary | ICD-10-CM | POA: Diagnosis not present

## 2023-04-03 DIAGNOSIS — N184 Chronic kidney disease, stage 4 (severe): Secondary | ICD-10-CM | POA: Diagnosis not present

## 2023-04-04 ENCOUNTER — Other Ambulatory Visit: Payer: Self-pay

## 2023-04-04 DIAGNOSIS — E039 Hypothyroidism, unspecified: Secondary | ICD-10-CM

## 2023-04-04 MED ORDER — LEVOTHYROXINE SODIUM 25 MCG PO TABS: 25.0000 ug | ORAL_TABLET | Freq: Every day | ORAL | 1 refills | Status: DC

## 2023-04-10 DIAGNOSIS — R809 Proteinuria, unspecified: Secondary | ICD-10-CM | POA: Diagnosis not present

## 2023-04-10 DIAGNOSIS — E1122 Type 2 diabetes mellitus with diabetic chronic kidney disease: Secondary | ICD-10-CM | POA: Diagnosis not present

## 2023-04-10 DIAGNOSIS — N2581 Secondary hyperparathyroidism of renal origin: Secondary | ICD-10-CM | POA: Diagnosis not present

## 2023-04-10 DIAGNOSIS — I129 Hypertensive chronic kidney disease with stage 1 through stage 4 chronic kidney disease, or unspecified chronic kidney disease: Secondary | ICD-10-CM | POA: Diagnosis not present

## 2023-04-10 DIAGNOSIS — E875 Hyperkalemia: Secondary | ICD-10-CM | POA: Diagnosis not present

## 2023-04-10 DIAGNOSIS — N184 Chronic kidney disease, stage 4 (severe): Secondary | ICD-10-CM | POA: Diagnosis not present

## 2023-04-17 ENCOUNTER — Other Ambulatory Visit: Payer: Self-pay | Admitting: Internal Medicine

## 2023-04-25 ENCOUNTER — Telehealth: Payer: Self-pay | Admitting: Internal Medicine

## 2023-04-25 MED ORDER — METOPROLOL SUCCINATE ER 50 MG PO TB24: 50.0000 mg | ORAL_TABLET | Freq: Every day | ORAL | 0 refills | Status: DC

## 2023-04-25 NOTE — Telephone Encounter (Signed)
*  STAT* If patient is at the pharmacy, call can be transferred to refill team.   1. Which medications need to be refilled? (please list name of each medication and dose if known) metoprolol succinate (TOPROL-XL) 50 MG 24 hr tablet  2. Which pharmacy/location (including street and city if local pharmacy) is medication to be sent to? [facility] - [address] [place], [place], [place] [address]  3. Do they need a 30 day or 90 day supply?   90 day supply. Patient states she has been out of medication since 9/15.

## 2023-04-25 NOTE — Telephone Encounter (Signed)
Requested Prescriptions   Signed Prescriptions Disp Refills   metoprolol succinate (TOPROL-XL) 50 MG 24 hr tablet 90 tablet 0    Sig: Take 1 tablet (50 mg total) by mouth daily. Take with or immediately following a meal.    Authorizing Provider: Duke Salvia    Ordering User: Kendrick Fries

## 2023-04-29 ENCOUNTER — Ambulatory Visit: Payer: Medicare Other

## 2023-04-29 DIAGNOSIS — I442 Atrioventricular block, complete: Secondary | ICD-10-CM

## 2023-04-29 LAB — CUP PACEART REMOTE DEVICE CHECK
Battery Remaining Longevity: 129 mo
Battery Voltage: 3.04 V
Brady Statistic AP VP Percent: 95.36 %
Brady Statistic AP VS Percent: 0.02 %
Brady Statistic AS VP Percent: 4.43 %
Brady Statistic AS VS Percent: 0.18 %
Brady Statistic RA Percent Paced: 56.29 %
Brady Statistic RV Percent Paced: 99.76 %
Date Time Interrogation Session: 20240924035025
Implantable Lead Connection Status: 753985
Implantable Lead Connection Status: 753985
Implantable Lead Implant Date: 20141217
Implantable Lead Implant Date: 20141217
Implantable Lead Location: 753859
Implantable Lead Location: 753860
Implantable Lead Model: 5076
Implantable Lead Model: 5076
Implantable Pulse Generator Implant Date: 20230926
Lead Channel Impedance Value: 380 Ohm
Lead Channel Impedance Value: 399 Ohm
Lead Channel Impedance Value: 475 Ohm
Lead Channel Impedance Value: 513 Ohm
Lead Channel Pacing Threshold Amplitude: 0.375 V
Lead Channel Pacing Threshold Amplitude: 0.625 V
Lead Channel Pacing Threshold Pulse Width: 0.4 ms
Lead Channel Pacing Threshold Pulse Width: 0.4 ms
Lead Channel Sensing Intrinsic Amplitude: 11.25 mV
Lead Channel Sensing Intrinsic Amplitude: 11.25 mV
Lead Channel Sensing Intrinsic Amplitude: 3 mV
Lead Channel Sensing Intrinsic Amplitude: 3 mV
Lead Channel Setting Pacing Amplitude: 1.5 V
Lead Channel Setting Pacing Amplitude: 2 V
Lead Channel Setting Pacing Pulse Width: 0.4 ms
Lead Channel Setting Sensing Sensitivity: 2 mV
Zone Setting Status: 755011

## 2023-05-01 DIAGNOSIS — I129 Hypertensive chronic kidney disease with stage 1 through stage 4 chronic kidney disease, or unspecified chronic kidney disease: Secondary | ICD-10-CM | POA: Diagnosis not present

## 2023-05-01 DIAGNOSIS — N184 Chronic kidney disease, stage 4 (severe): Secondary | ICD-10-CM | POA: Diagnosis not present

## 2023-05-01 DIAGNOSIS — E1122 Type 2 diabetes mellitus with diabetic chronic kidney disease: Secondary | ICD-10-CM | POA: Diagnosis not present

## 2023-05-01 DIAGNOSIS — E875 Hyperkalemia: Secondary | ICD-10-CM | POA: Diagnosis not present

## 2023-05-01 DIAGNOSIS — R809 Proteinuria, unspecified: Secondary | ICD-10-CM | POA: Diagnosis not present

## 2023-05-01 DIAGNOSIS — N2581 Secondary hyperparathyroidism of renal origin: Secondary | ICD-10-CM | POA: Diagnosis not present

## 2023-05-02 ENCOUNTER — Other Ambulatory Visit: Payer: Self-pay | Admitting: Family Medicine

## 2023-05-02 DIAGNOSIS — K219 Gastro-esophageal reflux disease without esophagitis: Secondary | ICD-10-CM

## 2023-05-02 NOTE — Telephone Encounter (Signed)
Requested Prescriptions  Pending Prescriptions Disp Refills   omeprazole (PRILOSEC) 20 MG capsule [Pharmacy Med Name: OMEPRAZOLE DR CAPS 20MG ] 180 capsule 0    Sig: TAKE 1 CAPSULE TWICE A DAY BEFORE MEALS     Gastroenterology: Proton Pump Inhibitors Passed - 05/02/2023 12:09 AM      Passed - Valid encounter within last 12 months    Recent Outpatient Visits           2 months ago Type 2 diabetes mellitus with hyperlipidemia [facility])   [place] [facility] [place], Netta Neat, DO   4 months ago Chronic right shoulder pain   [place] [facility] Smitty Cords, DO   10 months ago Type 2 diabetes mellitus with hyperlipidemia [facility])   [place] [facility] Smitty Cords, DO   1 year ago Chronic right shoulder pain   [place] [facility] Smitty Cords, DO   1 year ago Type 2 diabetes mellitus with hyperlipidemia [facility])   [place] [facility] Althea Charon, Netta Neat, DO       Future Appointments             In 1 month Althea Charon, Netta Neat, DO [place] [facility]

## 2023-05-08 DIAGNOSIS — N2581 Secondary hyperparathyroidism of renal origin: Secondary | ICD-10-CM | POA: Diagnosis not present

## 2023-05-08 DIAGNOSIS — N184 Chronic kidney disease, stage 4 (severe): Secondary | ICD-10-CM | POA: Diagnosis not present

## 2023-05-08 DIAGNOSIS — R809 Proteinuria, unspecified: Secondary | ICD-10-CM | POA: Diagnosis not present

## 2023-05-08 DIAGNOSIS — I129 Hypertensive chronic kidney disease with stage 1 through stage 4 chronic kidney disease, or unspecified chronic kidney disease: Secondary | ICD-10-CM | POA: Diagnosis not present

## 2023-05-08 DIAGNOSIS — E1122 Type 2 diabetes mellitus with diabetic chronic kidney disease: Secondary | ICD-10-CM | POA: Diagnosis not present

## 2023-05-08 DIAGNOSIS — E875 Hyperkalemia: Secondary | ICD-10-CM | POA: Diagnosis not present

## 2023-05-09 ENCOUNTER — Telehealth: Payer: Self-pay | Admitting: Family Medicine

## 2023-05-09 DIAGNOSIS — G8929 Other chronic pain: Secondary | ICD-10-CM

## 2023-05-09 DIAGNOSIS — M150 Primary generalized (osteo)arthritis: Secondary | ICD-10-CM

## 2023-05-09 DIAGNOSIS — F5101 Primary insomnia: Secondary | ICD-10-CM

## 2023-05-09 NOTE — Telephone Encounter (Unsigned)
Copied from CRM [id number]. Topic: General - Other >> May 09, 2023  9:23 AM Everette C wrote: Reason for CRM: Medication Refill - Medication: temazepam (RESTORIL) 30 MG capsule [[id number]]  traMADol (ULTRAM) 50 MG tablet [[id number]]  Has the patient contacted their pharmacy? Yes.   (Agent: If no, request that the patient contact the pharmacy for the refill. If patient does not wish to contact the pharmacy document the reason why and proceed with request.) (Agent: If yes, when and what did the pharmacy advise?)  Preferred Pharmacy (with phone number or street name): [id number] [place] [place] [facility] [address] [place] [address] Phone: [phone] Fax: [phone] Hours: Not open 24 hours   Has the patient been seen for an appointment in the last year OR does the patient have an upcoming appointment? Yes.    Agent: Please be advised that RX refills may take up to 3 business days. We ask that you follow-up with your pharmacy.

## 2023-05-12 DIAGNOSIS — H40003 Preglaucoma, unspecified, bilateral: Secondary | ICD-10-CM | POA: Diagnosis not present

## 2023-05-12 MED ORDER — TRAMADOL HCL 50 MG PO TABS: 50.0000 mg | ORAL_TABLET | Freq: Three times a day (TID) | ORAL | 2 refills | Status: DC

## 2023-05-12 MED ORDER — TEMAZEPAM 30 MG PO CAPS
30.0000 mg | ORAL_CAPSULE | Freq: Every day | ORAL | 2 refills | Status: DC
Start: 2023-05-12 — End: 2023-10-21

## 2023-05-12 NOTE — Telephone Encounter (Signed)
Pt is requesting a refill on  tramadol 50 MG  Temazepm  30MG   [facility]  [place]  pt call back # is  [phone]

## 2023-05-15 NOTE — Progress Notes (Signed)
Remote pacemaker transmission.   

## 2023-05-19 DIAGNOSIS — H40003 Preglaucoma, unspecified, bilateral: Secondary | ICD-10-CM | POA: Diagnosis not present

## 2023-05-19 DIAGNOSIS — E119 Type 2 diabetes mellitus without complications: Secondary | ICD-10-CM | POA: Diagnosis not present

## 2023-05-19 DIAGNOSIS — H2513 Age-related nuclear cataract, bilateral: Secondary | ICD-10-CM | POA: Diagnosis not present

## 2023-05-19 DIAGNOSIS — H43813 Vitreous degeneration, bilateral: Secondary | ICD-10-CM | POA: Diagnosis not present

## 2023-05-19 LAB — HM DIABETES EYE EXAM

## 2023-05-26 ENCOUNTER — Encounter: Payer: Self-pay | Admitting: Family Medicine

## 2023-05-27 ENCOUNTER — Encounter: Payer: Self-pay | Admitting: Internal Medicine

## 2023-05-27 ENCOUNTER — Ambulatory Visit: Payer: Medicare Other | Attending: Internal Medicine | Admitting: Internal Medicine

## 2023-05-27 VITALS — BP 140/86 | HR 62 | Ht 59.0 in | Wt 152.4 lb

## 2023-05-27 DIAGNOSIS — I4819 Other persistent atrial fibrillation: Secondary | ICD-10-CM

## 2023-05-27 DIAGNOSIS — Z950 Presence of cardiac pacemaker: Secondary | ICD-10-CM

## 2023-05-27 NOTE — Progress Notes (Signed)
Patient Care Team: Smitty Cords, DO as PCP - General (Family Medicine) Lamont Dowdy, MD as Consulting Physician (Nephrology) Duke Salvia, MD as Consulting Physician (Cardiology) Felecia Shelling, DPM as Consulting Physician (Podiatry)   HPI  Renee Boyd is a 72 y.o. female Seen in followup for largely asymptomatic persistent atrial fibrillation and previously implanted pacemaker 2014 [employer]tronic, genchange 9/23  Anticoagulation  w Apixoban  no bleeding  Previously exposed to amiodarone when started [address]   Underwent cardioversion 7/21 with significant improvement in symptoms.  She reverted back to atrial fib/flutter shortly after her last office visit.  Anticipated repeat cardioversion following reloading with amiodarone to assess benefit of restoring sinus rhythm done 3/22; not tolerated 2/2 GI effects  The patient denies chest pain, shortness of breath, nocturnal dyspnea, orthopnea or peripheral edema.  There have been no palpitations, lightheadedness or syncope.    Close for her grandchildren but she is not long from this world.  This relates mostly to social concerns  Records and Results Reviewed Date Cr K Hgb  10/17 1.36  8.8  2/18 1.73 4.9   6/18  1.3  11.1  1/20 1.2 4.3 11.3  1/21 1.32 3.2 9.6  7/21 2.37 4.2 12.4  8/21 2.64 (CE)    12/21 2.64 (CE)     3/22 1.9. 4.5 12.1  9/22 1.78 4.4 12.0  11/22 2.35 4.2   2/23 2.53 4.4   5/23 2.09<< 3.16 4.1 12.8  11/23 1.59 3.8 14.1  9/24 1.58 4.6 13.8   DATE TEST EF   7/17 Echo   60-65 % Pulm HTN 64 (no E/E')  12/19  Echo  50-55% LVH severe (17/16)   PAsys 50-55   MR mild LAE(37/2.0/34)  1/20 CTA  CAD 2V   FFR > 0.8 LAD/RCA  5/22 Myoview  75% No ischemia          .   Thromboembolic risk factors ( age -40, HTN-1, TIA/CVA-2, DM-1 , Gender-1) for a CHADSVASc Score of 6     Past Medical History:  Diagnosis Date   Complete heart block (HCC)    a. s/p MDT PPM 2006 with device  generator replacement 2014; b. followed by Dr. Graciela Husbands   Diabetes [facility])    GERD (gastroesophageal reflux disease)    Hypertension    Hypothyroid    Pacemaker    Persistent atrial fibrillation (HCC)    a. on Eliquis; b. CHADS2VASc 6 (HTN. age x 1, DM, TIA x 2, female); c. s/p DCCV 05/28/18   Pulmonary hypertension (HCC)    a. echo 2017: EF of 60-65%, no RWMA, mildly dilated left atrium, RVSF normal, PASP 64 mmHg   TIA (transient ischemic attack)     Past Surgical History:  Procedure Laterality Date   ABDOMINAL HYSTERECTOMY     CARDIOVERSION N/A 05/28/2018   Procedure: CARDIOVERSION;  Surgeon: Antonieta Iba, MD;  Location: [facility] ORS;  Service: Cardiovascular;  Laterality: N/A;   CARDIOVERSION N/A 03/01/2020   Procedure: CARDIOVERSION;  Surgeon: Debbe Odea, MD;  Location: [facility] ORS;  Service: Cardiovascular;  Laterality: N/A;   CARDIOVERSION N/A 10/25/2020   Procedure: CARDIOVERSION;  Surgeon: Debbe Odea, MD;  Location: [facility] ORS;  Service: Cardiovascular;  Laterality: N/A;   CHOLECYSTECTOMY     PPM GENERATOR CHANGEOUT N/A 04/30/2022   Procedure: PPM GENERATOR CHANGEOUT;  Surgeon: Duke Salvia, MD;  Location: [facility] INVASIVE CV LAB;  Service: Cardiovascular;  Laterality: N/A;   TONSILLECTOMY  TUBAL LIGATION      Current Outpatient Medications  Medication Sig Dispense Refill   acetaminophen (TYLENOL) 500 MG tablet Take 1,000 mg by mouth every 8 (eight) hours as needed.     allopurinol (ZYLOPRIM) 100 MG tablet TAKE 1 TABLET DAILY 90 tablet 3   amLODipine (NORVASC) 10 MG tablet TAKE 1 TABLET DAILY 90 tablet 3   apixaban (ELIQUIS) 5 MG TABS tablet Take 1 tablet (5 mg total) by mouth 2 (two) times daily. 180 tablet 3   ascorbic acid (VITAMIN C) 1000 MG tablet Take 1,000 mg by mouth daily.     atorvastatin (LIPITOR) 20 MG tablet TAKE 1 TABLET DAILY 90 tablet 3   Baclofen 5 MG TABS Take 1 tablet (5 mg total) by mouth at bedtime. 90 tablet 3   COMBIVENT RESPIMAT 20-100  MCG/ACT AERS respimat INHALE 1 PUFF EVERY 6 HOURS AS NEEDED FOR WHEEZING/SOB 4 g 2   CONTOUR NEXT TEST test strip Use to check blood sugar up to twice a day. 200 each 5   desonide (DESOWEN) 0.05 % lotion Apply topically 2 (two) times daily. 59 mL 2   furosemide (LASIX) 20 MG tablet TAKE 3 TABLETS ONCE DAILY 270 tablet 0   gabapentin (NEURONTIN) 300 MG capsule Take 1 capsule (300 mg total) by mouth 2 (two) times daily. (Patient taking differently: Take 300 mg by mouth daily. After meal) 180 capsule 1   JARDIANCE 10 MG TABS tablet Take 10 mg by mouth daily.     levothyroxine (SYNTHROID) 25 MCG tablet Take 1 tablet (25 mcg total) by mouth daily before breakfast. 90 tablet 1   metoprolol succinate (TOPROL-XL) 50 MG 24 hr tablet Take 1 tablet (50 mg total) by mouth daily. Take with or immediately following a meal. 90 tablet 0   MOUNJARO 2.5 MG/0.5ML Pen Inject 2.5 mg into the skin once a week. 2 mL 0   Multiple Vitamin (MULTIVITAMIN) capsule Take 1 capsule by mouth daily.     omeprazole (PRILOSEC) 20 MG capsule TAKE 1 CAPSULE TWICE A DAY BEFORE MEALS 180 capsule 0   spironolactone (ALDACTONE) 25 MG tablet Take 25 mg by mouth daily.     telmisartan (MICARDIS) 20 MG tablet Take 20 mg by mouth daily.     temazepam (RESTORIL) 30 MG capsule Take 1 capsule (30 mg total) by mouth at bedtime. 30 capsule 2   traMADol (ULTRAM) 50 MG tablet Take 1 tablet (50 mg total) by mouth 3 (three) times daily. 90 tablet 2   No current facility-administered medications for this visit.    Allergies  Allergen Reactions   Morphine And Codeine Itching   Neosporin [Bacitracin-Polymyxin B]    Other     glue   Percocet [Oxycodone-Acetaminophen] Other (See Comments)    Hallucinations       Review of Systems negative except from HPI and PMH  Physical Exam  BP (!) 140/86 (BP Location: Left Arm, Patient Position: Sitting, Cuff Size: Normal)   Pulse 62   Ht 4\' 11"  (1.499 m)   Wt 152 lb 6.4 oz (69.1 kg)   BMI 30.78  kg/m  Well developed and well nourished in no acute distress HENT normal Neck supple with JVP-flat Clear Device pocket well healed; without hematoma or erythema.  There is no tethering  Regular rate and rhythm, no  gallop No  murmur Abd-soft with active BS No Clubbing cyanosis  edema Skin-warm and dry A & Oriented  Grossly normal sensory and motor function  ECG AV  pacing at 62  Device function is normal. Programming changes none  See Paceart for details     Assessment and  Plan  Atrial fibrillation/flutter  persistent  Ventricular tachycardia-nonsustained  HFpEF chronic  Hypertension   Pacemaker  Medtronic   Sinus arrest  Complete heart block  Renal insufficiency  gr 4 ( CrCL 24)   Orthostatic hypotension  PVCs  Sleep disordered breathing and daytime somnolence averse to a sleep study   Increasing frequency of episodes of atrial fibrillation.  Rate control is reasonable.  No evidence of nonsustained ventricular tachycardia.  Continue bisoprolol.  Euvolemic with her HFpEF.  Continue her on Jardiance and spironolactone and furosemide.  Blood pressure reasonably controlled on amlodipine metoprolol and spironolactone.

## 2023-05-27 NOTE — Patient Instructions (Signed)
Medication Instructions:  The current medical regimen is effective;  continue present plan and medications.  *If you need a refill on your cardiac medications before your next appointment, please call your pharmacy*   Follow-Up: At [facility], you and your health needs are our priority.  As part of our continuing mission to provide you with exceptional heart care, we have created designated Provider Care Teams.  These Care Teams include your primary Cardiologist (physician) and Advanced Practice Providers (APPs -  Physician Assistants and Nurse Practitioners) who all work together to provide you with the care you need, when you need it.  We recommend signing up for the patient portal called "MyChart".  Sign up information is provided on this After Visit Summary.  MyChart is used to connect with patients for Virtual Visits (Telemedicine).  Patients are able to view lab/test results, encounter notes, upcoming appointments, etc.  Non-urgent messages can be sent to your provider as well.   To learn more about what you can do with MyChart, go to [email or web address].    Your next appointment:   9 month(s)  Provider:   Sherryl Manges, MD

## 2023-06-01 LAB — CUP PACEART INCLINIC DEVICE CHECK
Date Time Interrogation Session: 20241022174004
Implantable Lead Connection Status: 753985
Implantable Lead Connection Status: 753985
Implantable Lead Implant Date: 20141217
Implantable Lead Implant Date: 20141217
Implantable Lead Location: 753859
Implantable Lead Location: 753860
Implantable Lead Model: 5076
Implantable Lead Model: 5076
Implantable Pulse Generator Implant Date: 20230926

## 2023-06-05 DIAGNOSIS — N184 Chronic kidney disease, stage 4 (severe): Secondary | ICD-10-CM | POA: Diagnosis not present

## 2023-06-05 DIAGNOSIS — E1122 Type 2 diabetes mellitus with diabetic chronic kidney disease: Secondary | ICD-10-CM | POA: Diagnosis not present

## 2023-06-05 LAB — HEMOGLOBIN A1C
Hemoglobin A1C: 6.9
Hemoglobin A1C: 6.9

## 2023-06-12 DIAGNOSIS — N2581 Secondary hyperparathyroidism of renal origin: Secondary | ICD-10-CM | POA: Diagnosis not present

## 2023-06-12 DIAGNOSIS — I129 Hypertensive chronic kidney disease with stage 1 through stage 4 chronic kidney disease, or unspecified chronic kidney disease: Secondary | ICD-10-CM | POA: Diagnosis not present

## 2023-06-12 DIAGNOSIS — N184 Chronic kidney disease, stage 4 (severe): Secondary | ICD-10-CM | POA: Diagnosis not present

## 2023-06-12 DIAGNOSIS — R809 Proteinuria, unspecified: Secondary | ICD-10-CM | POA: Diagnosis not present

## 2023-06-12 DIAGNOSIS — E875 Hyperkalemia: Secondary | ICD-10-CM | POA: Diagnosis not present

## 2023-06-12 DIAGNOSIS — E1159 Type 2 diabetes mellitus with other circulatory complications: Secondary | ICD-10-CM | POA: Diagnosis not present

## 2023-06-12 DIAGNOSIS — E1122 Type 2 diabetes mellitus with diabetic chronic kidney disease: Secondary | ICD-10-CM | POA: Diagnosis not present

## 2023-06-12 DIAGNOSIS — I10 Essential (primary) hypertension: Secondary | ICD-10-CM | POA: Diagnosis not present

## 2023-06-12 LAB — MICROALBUMIN / CREATININE URINE RATIO: Microalb Creat Ratio: 130

## 2023-06-16 ENCOUNTER — Encounter: Payer: Self-pay | Admitting: Family Medicine

## 2023-06-16 ENCOUNTER — Ambulatory Visit (INDEPENDENT_AMBULATORY_CARE_PROVIDER_SITE_OTHER): Payer: Medicare Other | Admitting: Family Medicine

## 2023-06-16 VITALS — BP 132/72 | Ht 59.0 in | Wt 153.0 lb

## 2023-06-16 DIAGNOSIS — E1169 Type 2 diabetes mellitus with other specified complication: Secondary | ICD-10-CM | POA: Diagnosis not present

## 2023-06-16 DIAGNOSIS — E785 Hyperlipidemia, unspecified: Secondary | ICD-10-CM

## 2023-06-16 DIAGNOSIS — I129 Hypertensive chronic kidney disease with stage 1 through stage 4 chronic kidney disease, or unspecified chronic kidney disease: Secondary | ICD-10-CM

## 2023-06-16 DIAGNOSIS — N184 Chronic kidney disease, stage 4 (severe): Secondary | ICD-10-CM

## 2023-06-16 DIAGNOSIS — M5442 Lumbago with sciatica, left side: Secondary | ICD-10-CM | POA: Diagnosis not present

## 2023-06-16 DIAGNOSIS — E782 Mixed hyperlipidemia: Secondary | ICD-10-CM

## 2023-06-16 DIAGNOSIS — M150 Primary generalized (osteo)arthritis: Secondary | ICD-10-CM | POA: Diagnosis not present

## 2023-06-16 DIAGNOSIS — G8929 Other chronic pain: Secondary | ICD-10-CM

## 2023-06-16 NOTE — Progress Notes (Signed)
Subjective:    Patient ID: Renee Boyd, female    DOB: 04/02/1951, 72 y.o.   MRN: [id number]  Renee Boyd is a 72 y.o. female presenting on 06/16/2023 for Medical Management of Chronic Issues   HPI  Discussed the use of AI scribe software for clinical note transcription with the patient, who gave verbal consent to proceed.  Updates Gabapentin reduced to once per day Occasionally having cramps, may take nap during day Nephrology started Spironolactone, and she will follow-up with them Endocrinology switched her off of Mounjaro (allergic injection site reaction) and back to Trulicity 3.0mg  weekly inj She [facility]   Chronic Right Shoulder and Neck Pain Osteoarthritis, multiple joints Back Pain / Hip Pain / Shoulder pain Chronic problem. Episodic painful flares of joint pain, worse with inc activity and storms and weather changes Previously managed on Tramadol PRN for pain and this was renally adjusted to max of 50mg  BID - still has if needs, may request re order in future - she reports it has been ineffective now for shoulder pain  Cannot take NSAIDs due to CKD. On Gabapentin 300mg  daily doing well. Using baclofen 5mg  only max dose nightly She wakes up with pain worse at night R shoulder, has flares difficulty raising arm.   Followed by [facility] Pain Management Not going to get another injection right away She said Right shoulder worsening after using it more due to problem with Left She will continue to work with Pain Management   Previously on Gabapentin 300mg  daily, request re order. To trial again. She stopped in the past.   Type 2 Diabetes Followed by [facility] Endocrinology currently due to advancing renal disease. She reports a recent switch from [facility] due to adverse reactions, including skin welts and itching. She expresses frustration with weight gain despite dietary changes. On Jardiance 10mg  daily Last A1c 6.9  (05/2023) Off Metformin On Jardiance 10mg  - per Nephrology Off Metformin due to CKD Off Rybelsus due to nausea  CKD-IV with hyperkalemia and subnephrotic proteinuria HTN Secondary Anemia of chronic kidney disease  Secondary to DM HTN Followed by Dr Wynelle Link   Persistent Atrial Fibrillation, s/p Cardioversion On Anticoagulation with Eliquis Complete Heart Block Precordial Chest Pain Followed by Dr Sherryl Manges She had Cardioversion in March 2022, unsuccessful.   Chronic Insomnia Primary problem difficulty sleeping. She takes Temazepam nightly PRN. Will need refill in future      06/16/2023    9:13 AM 03/28/2023    3:21 PM 02/19/2023    2:18 PM  Depression screen PHQ 2/9  Decreased Interest 0 1 0  Down, Depressed, Hopeless 0 1 0  PHQ - 2 Score 0 2 0  Altered sleeping 0 1   Tired, decreased energy 0 0   Change in appetite 0 0   Feeling bad or failure about yourself  0 0   Trouble concentrating 0 0   Moving slowly or fidgety/restless 0 0   Suicidal thoughts 0 0   PHQ-9 Score 0 3   Difficult doing work/chores  Not difficult at all     Social History   Tobacco Use   Smoking status: Former    Current packs/day: 0.00    Average packs/day: 2.0 packs/day for 40.0 years (80.0 ttl pk-yrs)    Types: Cigarettes    Start date: 06/27/1965    Quit date: 06/27/2005    Years since quitting: 17.9   Smokeless tobacco: Former   Tobacco  comments:    quit Jun 27 2005  Vaping Use   Vaping status: Never Used  Substance Use Topics   Alcohol use: No   Drug use: No    Review of Systems  Constitutional:  Negative for activity change, appetite change, chills, diaphoresis, fatigue and fever.  HENT:  Negative for congestion and hearing loss.   Eyes:  Negative for visual disturbance.  Respiratory:  Negative for cough, chest tightness, shortness of breath and wheezing.   Cardiovascular:  Negative for chest pain, palpitations and leg swelling.  Gastrointestinal:  Negative for abdominal  pain, constipation, diarrhea, nausea and vomiting.  Genitourinary:  Negative for dysuria, frequency and hematuria.  Musculoskeletal:  Negative for arthralgias and neck pain.  Skin:  Negative for rash.  Neurological:  Negative for dizziness, weakness, light-headedness, numbness and headaches.  Hematological:  Negative for adenopathy.  Psychiatric/Behavioral:  Negative for behavioral problems, dysphoric mood and sleep disturbance.    Per HPI unless specifically indicated above     Objective:    BP 132/72   Ht 4\' 11"  (1.499 m)   Wt 153 lb (69.4 kg)   BMI 30.90 kg/m   Wt Readings from Last 3 Encounters:  06/16/23 153 lb (69.4 kg)  05/27/23 152 lb 6.4 oz (69.1 kg)  02/19/23 150 lb (68 kg)    Physical Exam Vitals and nursing note reviewed.  Constitutional:      General: She is not in acute distress.    Appearance: She is well-developed. She is not diaphoretic.     Comments: Well-appearing, comfortable, cooperative  HENT:     Head: Normocephalic and atraumatic.  Eyes:     General:        Right eye: No discharge.        Left eye: No discharge.     Conjunctiva/sclera: Conjunctivae normal.  Neck:     Thyroid: No thyromegaly.  Cardiovascular:     Rate and Rhythm: Normal rate and regular rhythm.     Heart sounds: Normal heart sounds. No murmur heard. Pulmonary:     Effort: Pulmonary effort is normal. No respiratory distress.     Breath sounds: Normal breath sounds. No wheezing or rales.  Musculoskeletal:        General: Normal range of motion.     Cervical back: Normal range of motion and neck supple.  Lymphadenopathy:     Cervical: No cervical adenopathy.  Skin:    General: Skin is warm and dry.     Findings: No erythema or rash.  Neurological:     Mental Status: She is alert and oriented to person, place, and time.  Psychiatric:        Behavior: Behavior normal.     Comments: Well groomed, good eye contact, normal speech and thoughts     Diabetic Foot Exam - Simple    Simple Foot Form Diabetic Foot exam was performed with the following findings: Yes 06/16/2023  9:50 AM  Visual Inspection See comments: Yes Sensation Testing Intact to touch and monofilament testing bilaterally: Yes Pulse Check Posterior Tibialis and Dorsalis pulse intact bilaterally: Yes Comments Bilateral bunion. No callus formation. Intact to monofilament sensation.      Protein, Total, Random Urine w/Creatinine (Protein/Creat Ratio) Order: [id number] Component Ref Range & Units 4 d ago  Creatinine, Ur 20 - 275 mg/dL 54  Urine Protein/Creatinine Ratio 24 - 184 mg/g creat 130  Protein/Creatinine Ratio, Urine 0.024 - 0.184 mg/mg creat 0.13  Protein Urine Random 5 - 24 mg/dL 7  Resulting Agency See order comments  Narrative Performed by [facility] 04/03/2023 FROM [id number]   Results for orders placed or performed in visit on 06/16/23  Hemoglobin A1c  Result Value Ref Range   Hemoglobin A1C 6.9   Hemoglobin A1c  Result Value Ref Range   Hemoglobin A1C 6.9   Microalbumin / creatinine urine ratio  Result Value Ref Range   Microalb Creat Ratio 130       Assessment & Plan:   Problem List Items Addressed This Visit     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 Off Mounjaro back on Trulicity  On Jardiance  Medication Management Patient experienced difficulties with medication delivery and refills. [facility]. -Continue Tramadol and Restoril as prescribed, [facility].  Continue regular  pacemaker checks as scheduled by cardiologist.     Orders Placed This Encounter  Procedures   Hemoglobin A1c    This external order was created through the Results Console.   Hemoglobin A1c    This external order was created through the Results Console.   Microalbumin / creatinine urine ratio    This external order was created through the Results Console.       No orders of the defined types were placed in this encounter.    Follow up plan: Return in about 6 months (around 12/14/2023) for 6 month DM CKD updates.  Saralyn Pilar, DO [facility] 06/16/2023, 9:34 AM

## 2023-06-16 NOTE — Patient Instructions (Addendum)
Thank you for coming to the office today.  Keep on current regimen with medicines.  [facility] Temazepam - check with pharmacy for refills.  Please schedule a Follow-up Appointment to: Return in about 6 months (around 12/14/2023) for 6 month DM CKD updates.  If you have any other questions or concerns, please feel free to call the office or send a message through MyChart. You may also schedule an earlier appointment if necessary.  Additionally, you may be receiving a survey about your experience at our office within a few days to 1 week by e-mail or mail. We value your feedback.  Saralyn Pilar, DO [facility], [place]

## 2023-06-18 DIAGNOSIS — R809 Proteinuria, unspecified: Secondary | ICD-10-CM | POA: Diagnosis not present

## 2023-06-18 DIAGNOSIS — N2581 Secondary hyperparathyroidism of renal origin: Secondary | ICD-10-CM | POA: Diagnosis not present

## 2023-06-18 DIAGNOSIS — E1122 Type 2 diabetes mellitus with diabetic chronic kidney disease: Secondary | ICD-10-CM | POA: Diagnosis not present

## 2023-06-18 DIAGNOSIS — E875 Hyperkalemia: Secondary | ICD-10-CM | POA: Diagnosis not present

## 2023-06-18 DIAGNOSIS — N184 Chronic kidney disease, stage 4 (severe): Secondary | ICD-10-CM | POA: Diagnosis not present

## 2023-06-18 DIAGNOSIS — I129 Hypertensive chronic kidney disease with stage 1 through stage 4 chronic kidney disease, or unspecified chronic kidney disease: Secondary | ICD-10-CM | POA: Diagnosis not present

## 2023-07-09 ENCOUNTER — Telehealth: Payer: Self-pay | Admitting: Internal Medicine

## 2023-07-09 MED ORDER — METOPROLOL SUCCINATE ER 50 MG PO TB24: 50.0000 mg | ORAL_TABLET | Freq: Every day | ORAL | 3 refills | Status: DC

## 2023-07-09 NOTE — Telephone Encounter (Signed)
 *  STAT* If patient is at the pharmacy, call can be transferred to refill team.   1. Which medications need to be refilled? (please list name of each medication and dose if known)? metoprolol succinate (TOPROL XL) 50 MG 24 hr tablet  2. Which pharmacy/location (including street and city if local pharmacy) is medication to be sent to? [facility] - [place], [place] - [address]  3. Do they need a 30 day or 90 day supply? 90 day

## 2023-07-12 ENCOUNTER — Emergency Department: Payer: Medicare Other

## 2023-07-12 ENCOUNTER — Other Ambulatory Visit: Payer: Self-pay

## 2023-07-12 ENCOUNTER — Emergency Department
Admission: EM | Admit: 2023-07-12 | Discharge: 2023-07-12 | Disposition: A | Discharge status: Home / Self Care | Payer: Medicare Other | Attending: Emergency Medicine | Admitting: Emergency Medicine

## 2023-07-12 ENCOUNTER — Encounter: Payer: Self-pay | Admitting: Radiology

## 2023-07-12 DIAGNOSIS — R059 Cough, unspecified: Secondary | ICD-10-CM | POA: Diagnosis not present

## 2023-07-12 DIAGNOSIS — J069 Acute upper respiratory infection, unspecified: Secondary | ICD-10-CM | POA: Insufficient documentation

## 2023-07-12 DIAGNOSIS — I509 Heart failure, unspecified: Secondary | ICD-10-CM | POA: Insufficient documentation

## 2023-07-12 DIAGNOSIS — I482 Chronic atrial fibrillation, unspecified: Secondary | ICD-10-CM | POA: Insufficient documentation

## 2023-07-12 DIAGNOSIS — N1832 Chronic kidney disease, stage 3b: Secondary | ICD-10-CM | POA: Insufficient documentation

## 2023-07-12 DIAGNOSIS — I10 Essential (primary) hypertension: Secondary | ICD-10-CM | POA: Diagnosis not present

## 2023-07-12 DIAGNOSIS — E1165 Type 2 diabetes mellitus with hyperglycemia: Secondary | ICD-10-CM | POA: Diagnosis not present

## 2023-07-12 DIAGNOSIS — R197 Diarrhea, unspecified: Secondary | ICD-10-CM | POA: Diagnosis not present

## 2023-07-12 DIAGNOSIS — I130 Hypertensive heart and chronic kidney disease with heart failure and stage 1 through stage 4 chronic kidney disease, or unspecified chronic kidney disease: Secondary | ICD-10-CM | POA: Diagnosis not present

## 2023-07-12 DIAGNOSIS — E1122 Type 2 diabetes mellitus with diabetic chronic kidney disease: Secondary | ICD-10-CM | POA: Insufficient documentation

## 2023-07-12 DIAGNOSIS — E119 Type 2 diabetes mellitus without complications: Secondary | ICD-10-CM

## 2023-07-12 DIAGNOSIS — I110 Hypertensive heart disease with heart failure: Secondary | ICD-10-CM | POA: Diagnosis not present

## 2023-07-12 DIAGNOSIS — I700 Atherosclerosis of aorta: Secondary | ICD-10-CM | POA: Diagnosis not present

## 2023-07-12 LAB — URINALYSIS, ROUTINE W REFLEX MICROSCOPIC
Bilirubin Urine: NEGATIVE
Glucose, UA: >=500 mg/dL — AB
Ketones, ur: 80 mg/dL — AB
Leukocytes,Ua: NEGATIVE
Nitrite: NEGATIVE
Protein, ur: 30 mg/dL — AB
Specific Gravity, Urine: 1.013 (ref 1.005–1.030)
pH: 5 (ref 5.0–8.0)

## 2023-07-12 LAB — COMPREHENSIVE METABOLIC PANEL
ALT: 28 U/L (ref 0–44)
AST: 39 U/L (ref 15–41)
Albumin: 4.1 g/dL (ref 3.5–5.0)
Alkaline Phosphatase: 58 U/L (ref 38–126)
Anion gap: 16 — ABNORMAL HIGH (ref 5–15)
BUN: 19 mg/dL (ref 8–23)
CO2: 16 mmol/L — ABNORMAL LOW (ref 22–32)
Calcium: 9.5 mg/dL (ref 8.9–10.3)
Chloride: 103 mmol/L (ref 98–111)
Creatinine, Ser: 1.49 mg/dL — ABNORMAL HIGH (ref 0.44–1.00)
GFR, Estimated: 37 mL/min — ABNORMAL LOW (ref >60)
Glucose, Bld: 70 mg/dL (ref 70–99)
Potassium: 4.2 mmol/L (ref 3.5–5.1)
Sodium: 135 mmol/L (ref 135–145)
Total Bilirubin: 1.1 mg/dL (ref <1.2)
Total Protein: 7 g/dL (ref 6.5–8.1)

## 2023-07-12 LAB — CBC
HCT: 42.9 % (ref 36.0–46.0)
Hemoglobin: 14.2 g/dL (ref 12.0–15.0)
MCH: 29.5 pg (ref 26.0–34.0)
MCHC: 33.1 g/dL (ref 30.0–36.0)
MCV: 89.2 fL (ref 80.0–100.0)
Platelets: 170 10*3/uL (ref 150–400)
RBC: 4.81 MIL/uL (ref 3.87–5.11)
RDW: 15.9 % — ABNORMAL HIGH (ref 11.5–15.5)
WBC: 2.5 10*3/uL — ABNORMAL LOW (ref 4.0–10.5)
nRBC: 0 % (ref 0.0–0.2)

## 2023-07-12 LAB — LIPASE, BLOOD: Lipase: 31 U/L (ref 11–51)

## 2023-07-12 MED ORDER — LOPERAMIDE HCL 2 MG PO CAPS
4.0000 mg | ORAL_CAPSULE | Freq: Once | ORAL | Status: AC
  Administered 2023-07-12: 4 mg via ORAL
  Filled 2023-07-12: qty 2

## 2023-07-12 MED ORDER — AZITHROMYCIN 250 MG PO TABS: ORAL_TABLET | ORAL | 0 refills | Status: DC

## 2023-07-12 MED ORDER — ONDANSETRON 4 MG PO TBDP
8.0000 mg | ORAL_TABLET | Freq: Once | ORAL | Status: AC
  Administered 2023-07-12: 8 mg via ORAL
  Filled 2023-07-12: qty 2

## 2023-07-12 MED ORDER — LOPERAMIDE HCL 2 MG PO TABS: 4.0000 mg | ORAL_TABLET | Freq: Four times a day (QID) | ORAL | 0 refills | Status: AC | PRN

## 2023-07-12 MED ORDER — ONDANSETRON 4 MG PO TBDP: 4.0000 mg | ORAL_TABLET | Freq: Three times a day (TID) | ORAL | 0 refills | Status: DC | PRN

## 2023-07-12 MED ORDER — ALBUTEROL SULFATE HFA 108 (90 BASE) MCG/ACT IN AERS
2.0000 | INHALATION_SPRAY | RESPIRATORY_TRACT | 0 refills | Status: AC | PRN
Start: 2023-07-12 — End: ?

## 2023-07-12 NOTE — ED Provider Notes (Signed)
[facility] : [facility] Provider Note    Event Date/Time   First MD Initiated Contact with Patient 07/12/23 1807     (approximate)   History   Chief Complaint: Cough and Diarrhea   HPI  Renee Boyd is a 72 y.o. female with a history of diabetes, CKD, hypertension, GERD who comes to the ED complaining of bodyaches, chills, nonproductive cough, diarrhea for the past 5 or 6 days.  Symptoms waxing and waning.  Tolerating oral intake, denies vomiting.  No fever.  No chest pain or other pain complaints.  Feels tired.  No black or bloody stool.          Physical Exam   Triage Vital Signs: ED Triage Vitals  Encounter Vitals Group     BP 07/12/23 1512 (!) 143/67     Systolic BP Percentile --      Diastolic BP Percentile --      Pulse Rate 07/12/23 1512 63     Resp 07/12/23 1512 18     Temp 07/12/23 1512 97.7 F (36.5 C)     Temp Source 07/12/23 1512 Oral     SpO2 07/12/23 1512 97 %     Weight 07/12/23 1507 153 lb (69.4 kg)     Height 07/12/23 1507 4\' 11"  (1.499 m)     Head Circumference --      Peak Flow --      Pain Score --      Pain Loc --      Pain Education --      Exclude from Growth Chart --     Most recent vital signs: Vitals:   07/12/23 1930 07/12/23 1945  BP: 133/64   Pulse: (!) 58 (!) 58  Resp: 15   Temp:    SpO2: 100% 96%    General: Awake, no distress.  CV:  Good peripheral perfusion.  Regular rate and rhythm Resp:  Normal effort.  Good air entry bilaterally, no crackles.  Diffuse mild expiratory wheezing. Abd:  No distention.  Soft nontender Other:  Moist oral mucosa.  No lower extremity edema   ED Results / Procedures / Treatments   Labs (all labs ordered are listed, but only abnormal results are displayed) Labs Reviewed  COMPREHENSIVE METABOLIC PANEL - Abnormal; Notable for the following components:      Result Value   CO2 16 (*)    Creatinine, Ser 1.49 (*)    GFR, Estimated 37 (*)    Anion gap 16 (*)    All other  components within normal limits  CBC - Abnormal; Notable for the following components:   WBC 2.5 (*)    RDW 15.9 (*)    All other components within normal limits  URINALYSIS, ROUTINE W REFLEX MICROSCOPIC - Abnormal; Notable for the following components:   Color, Urine YELLOW (*)    APPearance CLEAR (*)    Glucose, UA >=500 (*)    Hgb urine dipstick SMALL (*)    Ketones, ur 80 (*)    Protein, ur 30 (*)    Bacteria, UA RARE (*)    All other components within normal limits  LIPASE, BLOOD     EKG    RADIOLOGY Chest x-ray interpreted by me, unremarkable.  Radiology report reviewed   PROCEDURES:  Procedures   MEDICATIONS ORDERED IN ED: Medications  loperamide (IMODIUM) capsule 4 mg (4 mg Oral Given 07/12/23 1841)  ondansetron (ZOFRAN-ODT) disintegrating tablet 8 mg (8 mg Oral Given 07/12/23 1842)  IMPRESSION / MDM / ASSESSMENT AND PLAN / ED COURSE  I reviewed the triage vital signs and the nursing notes.  DDx: Viral syndrome, pneumonia, pleural effusion, pulmonary edema, electrolyte abnormality, AKI  Patient's presentation is most consistent with acute presentation with potential threat to life or bodily function.  Patient presents with multiple symptoms suggestive of viral illness with bronchitis and enteritis.  She has an albuterol inhaler at home which is several years old.  Will provide refill along with azithromycin and supportive care.  Patient is on Eliquis.  Considering the patient's symptoms, medical history, and physical examination today, I have low suspicion for ACS, PE, TAD, pneumothorax, carditis, mediastinitis, pneumonia, CHF, or sepsis.    Clinical Course as of 07/12/23 2112  Sat Jul 12, 2023  6644 Patient feeling better, tolerating oral intake, stable for discharge. [PS]    Clinical Course User Index [PS] Sharman Cheek, MD     FINAL CLINICAL IMPRESSION(S) / ED DIAGNOSES   Final diagnoses:  Viral URI with cough  CKD stage 3b, GFR 30-44  ml/min (HCC)  Chronic congestive heart failure, unspecified heart failure type (HCC)  Type 2 diabetes mellitus without complication, without long-term current use of insulin (HCC)  Chronic atrial fibrillation (HCC)     Rx / DC Orders   ED Discharge Orders          Ordered    ondansetron (ZOFRAN-ODT) 4 MG disintegrating tablet  Every 8 hours PRN        07/12/23 1936    loperamide (IMODIUM A-D) 2 MG tablet  4 times daily PRN        07/12/23 1936    albuterol (PROVENTIL HFA) 108 (90 Base) MCG/ACT inhaler  Every 4 hours PRN        07/12/23 1936    azithromycin (ZITHROMAX Z-PAK) 250 MG tablet        07/12/23 1936             Note:  This document was prepared using Dragon voice recognition software and may include unintentional dictation errors.   Sharman Cheek, MD 07/12/23 2113

## 2023-07-12 NOTE — ED Triage Notes (Signed)
Pt in via EMS from home with c/o being sick for a week. Pt has some diarrhea today. Denies vomiting or stomach pain. Pt has a wet cough. Pt get SOB with exertion. CBG 104, 180/93, 84HR paced, 96% RA

## 2023-07-12 NOTE — ED Notes (Signed)
Pt tolerating fluids. Denies NV. Family at bedside. EDP updated.

## 2023-07-12 NOTE — ED Triage Notes (Signed)
Pt states since Monday she has had a cough and diarrhea. She started taking over the counter meds for this without help. Pt states she has been coughing up red sputum. Denies N/V. Pt states every time she eats anything she has an episode of diarrhea.

## 2023-07-15 ENCOUNTER — Telehealth: Payer: Self-pay

## 2023-07-15 NOTE — Transitions of Care (Post Inpatient/ED Visit) (Signed)
07/15/2023  Name: Renee Boyd MRN: [id number] DOB: [birth year]  Today's TOC FU Call Status: Today's TOC FU Call Status:: Successful TOC FU Call Completed TOC FU Call Complete Date: 07/15/23 Patient's Name and Date of Birth confirmed.  Transition Care Management Follow-up Telephone Call Date of Discharge: 07/14/23 Discharge Facility: [facility]) Type of Discharge: Emergency Department Reason for ED Visit: Other: (bronchitis) How have you been since you were released from the hospital?: Better Any questions or concerns?: No  Items Reviewed: Did you receive and understand the discharge instructions provided?: Yes Medications obtained,verified, and reconciled?: Yes (Medications Reviewed) Any new allergies since your discharge?: No Dietary orders reviewed?: Yes Do you have support at home?: Yes People in Home: child(ren), adult  Medications Reviewed Today: Medications Reviewed Today     Reviewed by Karena Addison, LPN (Licensed Practical Nurse) on 07/15/23 at 1710  Med List Status: <None>   Medication Order Taking? Sig Documenting Provider Last Dose Status Informant  acetaminophen (TYLENOL) 500 MG tablet [id number] No Take 1,000 mg by mouth every 8 (eight) hours as needed. [provider] Taking Active   albuterol (PROVENTIL HFA) 108 (90 Base) MCG/ACT inhaler [id number]  Inhale 2 puffs into the lungs every 4 (four) hours as needed for wheezing or shortness of breath. Sharman Cheek, MD  Active   allopurinol (ZYLOPRIM) 100 MG tablet [id number] No TAKE 1 TABLET DAILY Smitty Cords, DO Taking Active   amLODipine (NORVASC) 10 MG tablet [id number] No TAKE 1 TABLET DAILY Duke Salvia, MD Taking Active   apixaban (ELIQUIS) 5 MG TABS tablet [id number] No Take 1 tablet (5 mg total) by mouth 2 (two) times daily. Duke Salvia, MD Taking Active   ascorbic acid (VITAMIN C) 1000 MG tablet [id number] No Take 1,000 mg by mouth daily. [provider] Taking Active Self  atorvastatin (LIPITOR) 20 MG tablet [id number] No TAKE 1 TABLET DAILY Duke Salvia, MD Taking Active   azithromycin (ZITHROMAX Z-PAK) 250 MG tablet [id number]  Take 2 tablets (500 mg) on  Day 1,  followed by 1 tablet (250 mg) once daily on Days 2 through 5. Sharman Cheek, MD  Active   Baclofen 5 MG TABS [id number] No Take 1 tablet (5 mg total) by mouth at bedtime. Smitty Cords, DO Taking Active   COMBIVENT RESPIMAT 20-100 MCG/ACT AERS respimat [id number] No INHALE 1 PUFF EVERY 6 HOURS AS NEEDED FOR WHEEZING/SOB Karamalegos, Netta Neat, DO Taking Active   CONTOUR NEXT TEST test strip [id number] No Use to check blood sugar up to twice a day. Smitty Cords, DO Taking Active   desonide (DESOWEN) 0.05 % lotion [id number] No Apply topically 2 (two) times daily. Smitty Cords, DO Taking Active   Dulaglutide (TRULICITY [place]) [id number] No Inject into the skin. [provider] Taking Active   furosemide (LASIX) 20 MG tablet [id number] No TAKE 3 TABLETS ONCE DAILY Duke Salvia, MD Taking Active   gabapentin (NEURONTIN) 300 MG capsule [id number] No Take 1 capsule (300 mg total) by mouth 2 (two) times daily.  Patient taking differently: Take 300 mg by mouth daily. After meal   Smitty Cords, DO Taking Active   JARDIANCE 10 MG TABS tablet [id number] No Take 10 mg by mouth daily. [provider] Taking Active   levothyroxine (SYNTHROID) 25 MCG tablet [id number] No Take 1 tablet (25 mcg total) by mouth daily before breakfast. Smitty Cords, DO Taking Active   loperamide (IMODIUM A-D) 2 MG  tablet [id number]  Take 2 tablets (4 mg total) by mouth 4 (four) times daily as needed for diarrhea or loose stools. Sharman Cheek, MD  Active   metoprolol succinate (TOPROL-XL) 50 MG 24 hr tablet [id number]  Take 1 tablet (50 mg total) by mouth daily. Take with or immediately following a meal. Duke Salvia, MD  Active    Multiple Vitamin (MULTIVITAMIN) capsule [id number] No Take 1 capsule by mouth daily. [provider] Taking Active Self  omeprazole (PRILOSEC) 20 MG capsule [id number] No TAKE 1 CAPSULE TWICE A DAY BEFORE MEALS Karamalegos, Netta Neat, DO Taking Active   ondansetron (ZOFRAN-ODT) 4 MG disintegrating tablet [id number]  Take 1 tablet (4 mg total) by mouth every 8 (eight) hours as needed for nausea or vomiting. Sharman Cheek, MD  Active   spironolactone (ALDACTONE) 25 MG tablet [id number] No Take 25 mg by mouth daily. [provider] Taking Active   telmisartan (MICARDIS) 20 MG tablet [id number] No Take 20 mg by mouth daily. [provider] Taking Active   temazepam (RESTORIL) 30 MG capsule [id number] No Take 1 capsule (30 mg total) by mouth at bedtime. Smitty Cords, DO Taking Active   traMADol (ULTRAM) 50 MG tablet [id number] No Take 1 tablet (50 mg total) by mouth 3 (three) times daily. Smitty Cords, DO Taking Active   Med List Note Concepcion [place], [place] 11/05/22 1884): 09/26/22 Medical clearance sent to stop Eliquis for Cervical epidural to Sherryl Manges MD.  LP 11-04-22 Clearance request faxed again. DW 11-05-22 Received clearance from Dr. Graciela Husbands to stop Eliquis 4 days.             Home Care and Equipment/Supplies: Were Home Health Services Ordered?: NA Any new equipment or medical supplies ordered?: NA  Functional Questionnaire: Do you need assistance with bathing/showering or dressing?: No Do you need assistance with meal preparation?: No Do you need assistance with eating?: No Do you have difficulty maintaining continence: No Do you need assistance with getting out of bed/getting out of a chair/moving?: No Do you have difficulty managing or taking your medications?: No  Follow up appointments reviewed: PCP Follow-up appointment confirmed?: Yes Date of PCP follow-up appointment?: 07/21/23 Follow-up Provider: [facility] Follow-up appointment confirmed?: NA Do you need transportation to your follow-up appointment?: No Do you understand care options if your condition(s) worsen?: Yes-patient verbalized understanding    SIGNATURE Karena Addison, LPN [facility] Nurse Health Advisor Direct Dial [phone]

## 2023-07-16 ENCOUNTER — Other Ambulatory Visit: Payer: Self-pay | Admitting: Internal Medicine

## 2023-07-21 ENCOUNTER — Ambulatory Visit (INDEPENDENT_AMBULATORY_CARE_PROVIDER_SITE_OTHER): Payer: Medicare Other | Admitting: Family Medicine

## 2023-07-21 ENCOUNTER — Encounter: Payer: Self-pay | Admitting: Family Medicine

## 2023-07-21 VITALS — BP 110/70 | HR 60 | Ht 59.0 in | Wt 153.0 lb

## 2023-07-21 DIAGNOSIS — A084 Viral intestinal infection, unspecified: Secondary | ICD-10-CM | POA: Diagnosis not present

## 2023-07-21 DIAGNOSIS — B349 Viral infection, unspecified: Secondary | ICD-10-CM

## 2023-07-21 NOTE — Progress Notes (Signed)
Subjective:    Patient ID: Renee Boyd, female    DOB: [birth year], 72 y.o.   MRN: [id number]  Renee Boyd is a 72 y.o. female presenting on 07/21/2023 for Hospitalization Follow-up (Beginning of the month patient had cough, diarrhea. Patient is better, follow up from hospital stay. )   HPI  Discussed the use of AI scribe software for clinical note transcription with the patient, who gave verbal consent to proceed.  History of Acute Viral Syndrome ED follow-up  The patient, with a history of diabetes, presented with a sudden onset of severe chest cough and congestion, which she described as worse than her previous COVID-19 infection. The symptoms began on December 2nd, following a normal day at church. The patient did not undergo testing for COVID-19 but sought care at a hospital where she underwent blood and urine tests, as well as a chest X-ray. The diagnosis was bronchitis and a lower respiratory infection, for which she received treatment. The patient reported a slow recovery, with energy being the last to return.  The patient also reported a significant bout of diarrhea during her illness, which has since resolved. However, she is now struggling with constipation and is hesitant to use laxatives. She has been managing her diabetes with medication, which she is glad to have back. She also mentioned using an inhaler for respiratory symptoms, which she found helpful.  Despite her recent illness, the patient has been trying to maintain her independence, doing her own banking, shopping, and managing her medications. She has been able to drive for the first time since her illness began and is slowly resuming her normal activities. However, she still does not feel back to her usual self.     Has imodium and nausea med zofran AS NEEDED if need. No longer taking   Health Maintenance: [facility]     07/21/2023   11:03 AM 06/16/2023    9:13 AM 03/28/2023    3:21 PM   Depression screen PHQ 2/9  Decreased Interest 0 0 1  Down, Depressed, Hopeless 0 0 1  PHQ - 2 Score 0 0 2  Altered sleeping  0 1  Tired, decreased energy  0 0  Change in appetite  0 0  Feeling bad or failure about yourself   0 0  Trouble concentrating  0 0  Moving slowly or fidgety/restless  0 0  Suicidal thoughts  0 0  PHQ-9 Score  0 3  Difficult doing work/chores   Not difficult at all       07/21/2023   11:03 AM 06/16/2023    9:13 AM 02/11/2023    8:44 AM 12/31/2022    8:59 AM  GAD 7 : Generalized Anxiety Score  Nervous, Anxious, on Edge 0 0 0 0  Control/stop worrying 0 0 0 0  Worry too much - different things 0 0 0 0  Trouble relaxing 0 0 0 0  Restless 0 0 0 0  Easily annoyed or irritable 0 0 0 0  Afraid - awful might happen 0 0 0 0  Total GAD 7 Score 0 0 0 0  Anxiety Difficulty   Not difficult at all     Social History   Tobacco Use   Smoking status: Former    Current packs/day: 0.00    Average packs/day: 2.0 packs/day for 40.0 years (80.0 ttl pk-yrs)    Types: Cigarettes    Start date: 06/27/1965    Quit date: 06/27/2005  Years since quitting: 18.0   Smokeless tobacco: Former   Tobacco comments:    quit Jun 27 2005  Vaping Use   Vaping status: Never Used  Substance Use Topics   Alcohol use: No   Drug use: No    Review of Systems Per HPI unless specifically indicated above     Objective:    BP 110/70   Pulse 60   Ht 4\' 11"  (1.499 m)   Wt 153 lb (69.4 kg)   BMI 30.90 kg/m   Wt Readings from Last 3 Encounters:  07/21/23 153 lb (69.4 kg)  07/12/23 153 lb (69.4 kg)  06/16/23 153 lb (69.4 kg)    Physical Exam Vitals and nursing note reviewed.  Constitutional:      General: She is not in acute distress.    Appearance: Normal appearance. She is well-developed. She is not diaphoretic.     Comments: Well-appearing, comfortable, cooperative  HENT:     Head: Normocephalic and atraumatic.  Eyes:     General:        Right eye: No discharge.         Left eye: No discharge.     Conjunctiva/sclera: Conjunctivae normal.  Cardiovascular:     Rate and Rhythm: Normal rate.  Pulmonary:     Effort: Pulmonary effort is normal.  Skin:    General: Skin is warm and dry.     Findings: No erythema or rash.  Neurological:     Mental Status: She is alert and oriented to person, place, and time.  Psychiatric:        Mood and Affect: Mood normal.        Behavior: Behavior normal.        Thought Content: Thought content normal.     Comments: Well groomed, good eye contact, normal speech and thoughts     I have personally reviewed the radiology report from 07/12/23 on CXR.   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 Cardiac silhouette is unremarkable. Stable atherosclerosis of the aorta. No acute airspace disease, effusion, or pneumothorax. No acute bony abnormalities.  IMPRESSION: 1. No acute intrathoracic process.   Electronically Signed By: Sharlet Salina M.D. On: 07/12/2023 15:41  Results for orders placed or performed during the hospital encounter of 07/12/23  Lipase, blood   Collection Time: 07/12/23  3:22 PM  Result Value Ref Range   Lipase 31 11 - 51 U/L  Comprehensive metabolic panel   Collection Time: 07/12/23  3:22 PM  Result Value Ref Range   Sodium 135 135 - 145 mmol/L   Potassium 4.2 3.5 - 5.1 mmol/L   Chloride 103 98 - 111 mmol/L   CO2 16 (L) 22 - 32 mmol/L   Glucose, Bld 70 70 - 99 mg/dL   BUN 19 8 - 23 mg/dL   Creatinine, Ser 1.61 (H) 0.44 - 1.00 mg/dL   Calcium 9.5 8.9 - 09.6 mg/dL   Total Protein 7.0 6.5 - 8.1 g/dL   Albumin 4.1 3.5 - 5.0 g/dL   AST 39 15 - 41 U/L   ALT 28 0 - 44 U/L   Alkaline Phosphatase 58 38 - 126 U/L   Total Bilirubin 1.1 <1.2 mg/dL   GFR, Estimated 37 (L) >60 mL/min   Anion gap 16 (H) 5 - 15  CBC   Collection Time: 07/12/23  3:22 PM  Result Value Ref Range   WBC 2.5 (L) 4.0 - 10.5 K/uL   RBC 4.81 3.87 - 5.11 MIL/uL   Hemoglobin 14.2 12.0 - 15.0 g/dL   HCT 81.1 91.4 - 78.2 %   MCV 89.2 80.0 - 100.0 fL   MCH 29.5 26.0 - 34.0 pg   MCHC 33.1 30.0 - 36.0 g/dL   RDW 95.6 (H) 21.3 - 08.6 %   Platelets 170 150 - 400 K/uL   nRBC 0.0 0.0 - 0.2 %  Urinalysis, Routine w reflex microscopic -Urine, Clean Catch   Collection Time: 07/12/23  3:22 PM  Result Value Ref Range   Color, Urine YELLOW (A) YELLOW   APPearance CLEAR (A) CLEAR   Specific Gravity, Urine 1.013 1.005 - 1.030   pH 5.0 5.0 - 8.0   Glucose, UA >=500 (A) NEGATIVE mg/dL   Hgb urine dipstick SMALL (A) NEGATIVE   Bilirubin Urine NEGATIVE NEGATIVE   Ketones, ur 80 (A) NEGATIVE mg/dL   Protein, ur 30 (A) NEGATIVE mg/dL   Nitrite NEGATIVE NEGATIVE   Leukocytes,Ua NEGATIVE NEGATIVE   RBC / HPF 0-5 0 - 5 RBC/hpf   WBC, UA 0-5 0 - 5 WBC/hpf   Bacteria, UA RARE (A) NONE SEEN   Squamous Epithelial / HPF 0-5 0 - 5 /HPF   Mucus PRESENT    Hyaline Casts, UA PRESENT    Cellular Cast, UA PRESENT       Assessment & Plan:   Problem List Items Addressed This Visit   None Visit Diagnoses       Acute viral syndrome    -  Primary     Viral gastroenteritis           ED Visit reviewed.  Post-Infectious Recovery Recent severe respiratory infection, diagnosed as bronchitis, with slow recovery of energy and return to baseline activities. No current cough or respiratory symptoms. -Continue supportive care and gradual return to normal activities.  Gastrointestinal Disturbance Post-infectious diarrhea, currently resolved but with ongoing irregular bowel movements. No current need for anti-diarrheal or anti-nausea medications. -Continue supportive care and monitor for return to normal bowel habits.  Vaccination Planning Discussion of potential vaccination, decision to delay until return to baseline health. -Check with pharmacy for vaccine availability in the new  year.  Follow-up General check-in and supportive care. -Schedule follow-up as needed.         No orders of the defined types were placed in this encounter.   No orders of the defined types were placed in this encounter.   Follow up plan: Return if symptoms worsen or fail to improve.   Saralyn Pilar, DO [facility] 07/21/2023, 11:14 AM

## 2023-07-21 NOTE — Patient Instructions (Addendum)
Thank you for coming to the office today.  Keep using medicines as needed. Most likely post viral syndrome, it will take time to resolve fully Digestive symptoms can linger as well. But it should get back reset on track.  Please schedule a Follow-up Appointment to: Return if symptoms worsen or fail to improve.  If you have any other questions or concerns, please feel free to call the office or send a message through MyChart. You may also schedule an earlier appointment if necessary.  Additionally, you may be receiving a survey about your experience at our office within a few days to 1 week by e-mail or mail. We value your feedback.  Saralyn Pilar, DO [facility] [place], [place]

## 2023-07-29 ENCOUNTER — Ambulatory Visit (INDEPENDENT_AMBULATORY_CARE_PROVIDER_SITE_OTHER): Payer: Medicare Other

## 2023-07-29 DIAGNOSIS — I442 Atrioventricular block, complete: Secondary | ICD-10-CM | POA: Diagnosis not present

## 2023-07-29 LAB — CUP PACEART REMOTE DEVICE CHECK
Battery Remaining Longevity: 129 mo
Battery Voltage: 3.03 V
Brady Statistic AP VP Percent: 92.4 %
Brady Statistic AP VS Percent: 0.05 %
Brady Statistic AS VP Percent: 6.36 %
Brady Statistic AS VS Percent: 1.17 %
Brady Statistic RA Percent Paced: 33.11 %
Brady Statistic RV Percent Paced: 99.22 %
Date Time Interrogation Session: 20241224000725
Implantable Lead Connection Status: 753985
Implantable Lead Connection Status: 753985
Implantable Lead Implant Date: 20141217
Implantable Lead Implant Date: 20141217
Implantable Lead Location: 753859
Implantable Lead Location: 753860
Implantable Lead Model: 5076
Implantable Lead Model: 5076
Implantable Pulse Generator Implant Date: 20230926
Lead Channel Impedance Value: 437 Ohm
Lead Channel Impedance Value: 475 Ohm
Lead Channel Impedance Value: 494 Ohm
Lead Channel Impedance Value: 532 Ohm
Lead Channel Pacing Threshold Amplitude: 0.375 V
Lead Channel Pacing Threshold Amplitude: 0.625 V
Lead Channel Pacing Threshold Pulse Width: 0.4 ms
Lead Channel Pacing Threshold Pulse Width: 0.4 ms
Lead Channel Sensing Intrinsic Amplitude: 3.375 mV
Lead Channel Sensing Intrinsic Amplitude: 3.375 mV
Lead Channel Sensing Intrinsic Amplitude: 9.25 mV
Lead Channel Sensing Intrinsic Amplitude: 9.25 mV
Lead Channel Setting Pacing Amplitude: 1.5 V
Lead Channel Setting Pacing Amplitude: 2 V
Lead Channel Setting Pacing Pulse Width: 0.4 ms
Lead Channel Setting Sensing Sensitivity: 2 mV
Zone Setting Status: 755011

## 2023-07-31 ENCOUNTER — Other Ambulatory Visit: Payer: Self-pay | Admitting: Family Medicine

## 2023-07-31 DIAGNOSIS — K219 Gastro-esophageal reflux disease without esophagitis: Secondary | ICD-10-CM

## 2023-08-01 NOTE — Telephone Encounter (Signed)
Requested Prescriptions  Pending Prescriptions Disp Refills   omeprazole (PRILOSEC) 20 MG capsule [Pharmacy Med Name: OMEPRAZOLE DR CAPS 20MG ] 180 capsule 1    Sig: TAKE 1 CAPSULE TWICE A DAY BEFORE MEALS     Gastroenterology: Proton Pump Inhibitors Passed - 08/01/2023 10:45 AM      Passed - Valid encounter within last 12 months    Recent Outpatient Visits           1 week ago Acute viral syndrome   [facility] [place], Netta Neat, DO   1 month ago Type 2 diabetes mellitus with hyperlipidemia [facility])   [place] [facility] [place], Netta Neat, DO   5 months ago Type 2 diabetes mellitus with hyperlipidemia [facility])   [place] [facility] Smitty Cords, DO   7 months ago Chronic right shoulder pain   [place] [facility] [place], Netta Neat, DO   1 year ago Type 2 diabetes mellitus with hyperlipidemia [facility])   [place] [facility] Charon, Netta Neat, DO       Future Appointments             In 4 months [place] Charon, Netta Neat, DO [place] [facility]   In 4 months Althea Charon, Netta Neat, DO [place] [facility], [place]

## 2023-09-08 ENCOUNTER — Other Ambulatory Visit: Payer: Self-pay | Admitting: Family Medicine

## 2023-09-08 DIAGNOSIS — G8929 Other chronic pain: Secondary | ICD-10-CM

## 2023-09-08 DIAGNOSIS — M150 Primary generalized (osteo)arthritis: Secondary | ICD-10-CM

## 2023-09-08 NOTE — Telephone Encounter (Signed)
Medication Refill -  Most Recent Primary Care Visit:  Provider: Smitty Cords  Department: [facility]  Visit Type: HOSPITAL FU  Date: 07/21/2023  Medication: traMADol (ULTRAM) 50 MG tablet [[id number]]   Has the patient ctontacted their pharmacy? Yes (Agent: If no, request that the patient contact the pharmacy for the refill. If patient does not wish to contact the pharmacy document the reason why and proceed with request.) (Agent: If yes, when and what did the pharmacy advise?)  Is this the correct pharmacy for this prescription? Yes If no, delete pharmacy and type the correct one.  This is the patient's preferred pharmacy:    [id number] [place] [place] - [address] [place] [address] Phone: [phone] Fax: [phone]     Has the prescription been filled recently? Yes  Is the patient out of the medication? Yes  Has the patient been seen for an appointment in the last year OR does the patient have an upcoming appointment? Yes  Can we respond through MyChart? Yes  Agent: Please be advised that Rx refills may take up to 3 business days. We ask that you follow-up with your pharmacy.

## 2023-09-09 MED ORDER — TRAMADOL HCL 50 MG PO TABS: 50.0000 mg | ORAL_TABLET | Freq: Three times a day (TID) | ORAL | 2 refills | Status: DC

## 2023-09-09 NOTE — Telephone Encounter (Signed)
 Requested medication (s) are due for refill today - yes  Requested medication (s) are on the active medication list -yes  Future visit scheduled -yes  Last refill: 05/12/23 #90 2RF  Notes to clinic: non delegated Rx  Requested Prescriptions  Pending Prescriptions Disp Refills   traMADol  (ULTRAM ) 50 MG tablet 90 tablet 2    Sig: Take 1 tablet (50 mg total) by mouth 3 (three) times daily.     Not Delegated - Analgesics:  Opioid Agonists Failed - 09/09/2023  8:42 AM      Failed - This refill cannot be delegated      Failed - Urine Drug Screen completed in last 360 days      Passed - Valid encounter within last 3 months    Recent Outpatient Visits           1 month ago Acute viral syndrome   [place] [facility] [place], [name], DO   2 months ago Type 2 diabetes mellitus with hyperlipidemia [facility])   [place] [facility] [place], [name], DO   7 months ago Type 2 diabetes mellitus with hyperlipidemia [facility])   [place] [facility] [place] [name], DO   8 months ago Chronic right shoulder pain   [place] [facility] [place], [name], DO   1 year ago Type 2 diabetes mellitus with hyperlipidemia [facility])   [place] [facility] [place], [name], DO       Future Appointments             In 3 months [place], [name], DO [place] [facility], [facility]   In 3 months [place], [name], DO [place] [facility]               Requested Prescriptions  Pending Prescriptions Disp Refills   traMADol  (ULTRAM ) 50 MG tablet 90 tablet 2    Sig: Take 1 tablet (50 mg total) by mouth 3 (three) times daily.     Not Delegated - Analgesics:  Opioid Agonists Failed - 09/09/2023  8:42 AM      Failed - This refill cannot be delegated      Failed - Urine Drug Screen completed in last 360 days      Passed - Valid  encounter within last 3 months    Recent Outpatient Visits           1 month ago Acute viral syndrome   [place] [facility] [place], [name], DO   2 months ago Type 2 diabetes mellitus with hyperlipidemia [facility])   [place] [facility] [place], [name], DO   7 months ago Type 2 diabetes mellitus with hyperlipidemia [facility])   [place] [facility] [place] [name], DO   8 months ago Chronic right shoulder pain   [place] [facility] [place], [name], DO   1 year ago Type 2 diabetes mellitus with hyperlipidemia [facility])   [place] [facility] [place], [name], DO       Future Appointments             In 3 months [place], [name], DO [facility], [facility]   In 3 months [place], [name], DO  [facility], [place]

## 2023-09-09 NOTE — Progress Notes (Signed)
 Remote pacemaker transmission.

## 2023-09-17 DIAGNOSIS — N2581 Secondary hyperparathyroidism of renal origin: Secondary | ICD-10-CM | POA: Diagnosis not present

## 2023-09-17 DIAGNOSIS — N184 Chronic kidney disease, stage 4 (severe): Secondary | ICD-10-CM | POA: Diagnosis not present

## 2023-09-17 DIAGNOSIS — E1122 Type 2 diabetes mellitus with diabetic chronic kidney disease: Secondary | ICD-10-CM | POA: Diagnosis not present

## 2023-09-17 DIAGNOSIS — R809 Proteinuria, unspecified: Secondary | ICD-10-CM | POA: Diagnosis not present

## 2023-09-17 DIAGNOSIS — E875 Hyperkalemia: Secondary | ICD-10-CM | POA: Diagnosis not present

## 2023-09-17 DIAGNOSIS — I129 Hypertensive chronic kidney disease with stage 1 through stage 4 chronic kidney disease, or unspecified chronic kidney disease: Secondary | ICD-10-CM | POA: Diagnosis not present

## 2023-09-17 LAB — PROTEIN / CREATININE RATIO, URINE
Albumin, U: 7
Creatinine, Urine: 44

## 2023-09-17 LAB — MICROALBUMIN, URINE: Microalb, Ur: 7

## 2023-09-17 LAB — MICROALBUMIN / CREATININE URINE RATIO: Microalb Creat Ratio: 159

## 2023-09-23 DIAGNOSIS — E1122 Type 2 diabetes mellitus with diabetic chronic kidney disease: Secondary | ICD-10-CM | POA: Diagnosis not present

## 2023-09-23 DIAGNOSIS — N2581 Secondary hyperparathyroidism of renal origin: Secondary | ICD-10-CM | POA: Diagnosis not present

## 2023-09-23 DIAGNOSIS — N184 Chronic kidney disease, stage 4 (severe): Secondary | ICD-10-CM | POA: Diagnosis not present

## 2023-09-23 DIAGNOSIS — I129 Hypertensive chronic kidney disease with stage 1 through stage 4 chronic kidney disease, or unspecified chronic kidney disease: Secondary | ICD-10-CM | POA: Diagnosis not present

## 2023-09-23 DIAGNOSIS — R809 Proteinuria, unspecified: Secondary | ICD-10-CM | POA: Diagnosis not present

## 2023-09-23 DIAGNOSIS — E875 Hyperkalemia: Secondary | ICD-10-CM | POA: Diagnosis not present

## 2023-10-01 ENCOUNTER — Other Ambulatory Visit: Payer: Self-pay | Admitting: Family Medicine

## 2023-10-01 DIAGNOSIS — E039 Hypothyroidism, unspecified: Secondary | ICD-10-CM

## 2023-10-01 MED ORDER — LEVOTHYROXINE SODIUM 25 MCG PO TABS: 25.0000 ug | ORAL_TABLET | Freq: Every day | ORAL | 1 refills | Status: DC

## 2023-10-07 ENCOUNTER — Other Ambulatory Visit: Payer: Self-pay | Admitting: Family Medicine

## 2023-10-07 DIAGNOSIS — G8929 Other chronic pain: Secondary | ICD-10-CM

## 2023-10-07 DIAGNOSIS — R252 Cramp and spasm: Secondary | ICD-10-CM

## 2023-10-07 NOTE — Telephone Encounter (Unsigned)
 Copied from CRM [id number]. Topic: Clinical - Medication Refill >> Oct 07, 2023 10:36 AM Antony Haste wrote: Most Recent Primary Care Visit:  Provider: Smitty Cords  Department: [facility]  Visit Type: HOSPITAL FU  Date: 07/21/2023  Medication: Baclofen 5 MG TABS gabapentin (NEURONTIN) 300 MG capsule  Has the patient contacted their pharmacy? Yes (Agent: If no, request that the patient contact the pharmacy for the refill. If patient does not wish to contact the pharmacy document the reason why and proceed with request.) (Agent: If yes, when and what did the pharmacy advise?)  Is this the correct pharmacy for this prescription? Yes If no, delete pharmacy and type the correct one.  This is the patient's preferred pharmacy:  [facility] - [place] - [address] [place] [address] Phone: [phone] Fax: [phone]  [id number] [place], [place] - [address] [place] [address] Phone: [phone] Fax: [phone]  [facility] [address]), [place] - [address] [place]) [place] [address] Phone: [phone] Fax: [phone]   Has the prescription been filled recently? No  Is the patient out of the medication? Yes  Has the patient been seen for an appointment in the last year OR does the patient have an upcoming appointment? Yes  Can we respond through MyChart? No, callback preferred.  Agent: Please be advised that Rx refills may take up to 3 business days. We ask that you follow-up with your pharmacy.

## 2023-10-08 MED ORDER — BACLOFEN 5 MG PO TABS: 5.0000 mg | ORAL_TABLET | Freq: Every day | ORAL | 0 refills | Status: DC

## 2023-10-08 MED ORDER — GABAPENTIN 300 MG PO CAPS: 300.0000 mg | ORAL_CAPSULE | Freq: Two times a day (BID) | ORAL | 1 refills | Status: DC

## 2023-10-08 NOTE — Telephone Encounter (Signed)
 Requested medication (s) are due for refill today: yes  Requested medication (s) are on the active medication list: yes  Last refill:  03/18/23  Future visit scheduled: yes  Notes to clinic:  How should patient be taking medication, once daily or twice a day, both instructions are listed. Routing for review.     Requested Prescriptions  Pending Prescriptions Disp Refills   gabapentin (NEURONTIN) 300 MG capsule 180 capsule 1    Sig: Take 1 capsule (300 mg total) by mouth 2 (two) times daily.     Neurology: Anticonvulsants - gabapentin Failed - 10/08/2023 11:47 AM      Failed - Cr in normal range and within 360 days    Creat  Date Value Ref Range Status  06/19/2022 1.59 (H) 0.60 - 1.00 mg/dL Final   Creatinine, Ser  Date Value Ref Range Status  07/12/2023 1.49 (H) 0.44 - 1.00 mg/dL Final         Passed - Completed PHQ-2 or PHQ-9 in the last 360 days      Passed - Valid encounter within last 12 months    Recent Outpatient Visits           2 months ago Acute viral syndrome   [place] [facility] [place], Netta Neat, DO   3 months ago Type 2 diabetes mellitus with hyperlipidemia [facility])   [place] [facility] [place], Netta Neat, DO   7 months ago Type 2 diabetes mellitus with hyperlipidemia [facility])   [place] [facility] Smitty Cords, DO   9 months ago Chronic right shoulder pain   [place] [facility] [place], Netta Neat, DO   1 year ago Type 2 diabetes mellitus with hyperlipidemia [facility])   [place] [facility] Althea Charon, Netta Neat, DO       Future Appointments             In 2 months Althea Charon, Netta Neat, DO [facility], [facility]   In 2 months Althea Charon, Netta Neat, DO [place] [facility], [facility]            Signed Prescriptions Disp Refills   Baclofen 5 MG TABS 90 tablet 0    Sig: Take 1 tablet  (5 mg total) by mouth at bedtime.     Analgesics:  Muscle Relaxants - baclofen Failed - 10/08/2023 11:47 AM      Failed - Cr in normal range and within 180 days    Creat  Date Value Ref Range Status  06/19/2022 1.59 (H) 0.60 - 1.00 mg/dL Final   Creatinine, Ser  Date Value Ref Range Status  07/12/2023 1.49 (H) 0.44 - 1.00 mg/dL Final         Passed - eGFR is 30 or above and within 180 days    GFR, Est African American  Date Value Ref Range Status  12/29/2018 33 (L) > OR = 60 mL/min/1.68m2 Final   GFR calc Af Amer  Date Value Ref Range Status  02/25/2020 23 (L) >60 mL/min Final   GFR, Est Non African American  Date Value Ref Range Status  12/29/2018 29 (L) > OR = 60 mL/min/1.32m2 Final   GFR, Estimated  Date Value Ref Range Status  07/12/2023 37 (L) >60 mL/min Final    Comment:    (NOTE) Calculated using the CKD-EPI Creatinine Equation (2021)    eGFR  Date Value Ref Range Status  06/19/2022 34 (L) > OR = 60 mL/min/1.5m2 Final  10/30/2021 15 (L) >59 mL/min/1.73 Final         Passed - Valid encounter within last 6 months    Recent Outpatient Visits           2 months ago Acute viral syndrome   [place] [facility] [place], Netta Neat, DO   3 months ago Type 2 diabetes mellitus with hyperlipidemia [facility])   [place] [facility] [place], Netta Neat, DO   7 months ago Type 2 diabetes mellitus with hyperlipidemia [facility])   [place] [facility] Smitty Cords, DO   9 months ago Chronic right shoulder pain   [place] [facility] Smitty Cords, DO   1 year ago Type 2 diabetes mellitus with hyperlipidemia [facility])   [place] [facility] Althea Charon, Netta Neat, DO       Future Appointments             In 2 months Althea Charon, Netta Neat, DO [place] [facility], [facility]   In 2 months Althea Charon, Netta Neat, DO [place] [facility], [place]

## 2023-10-08 NOTE — Telephone Encounter (Signed)
 Requested Prescriptions  Pending Prescriptions Disp Refills   Baclofen 5 MG TABS 90 tablet 0    Sig: Take 1 tablet (5 mg total) by mouth at bedtime.     Analgesics:  Muscle Relaxants - baclofen Failed - 10/08/2023 11:47 AM      Failed - Cr in normal range and within 180 days    Creat  Date Value Ref Range Status  06/19/2022 1.59 (H) 0.60 - 1.00 mg/dL Final   Creatinine, Ser  Date Value Ref Range Status  07/12/2023 1.49 (H) 0.44 - 1.00 mg/dL Final         Passed - eGFR is 30 or above and within 180 days    GFR, Est African American  Date Value Ref Range Status  12/29/2018 33 (L) > OR = 60 mL/min/1.89m2 Final   GFR calc Af Amer  Date Value Ref Range Status  02/25/2020 23 (L) >60 mL/min Final   GFR, Est Non African American  Date Value Ref Range Status  12/29/2018 29 (L) > OR = 60 mL/min/1.67m2 Final   GFR, Estimated  Date Value Ref Range Status  07/12/2023 37 (L) >60 mL/min Final    Comment:    (NOTE) Calculated using the CKD-EPI Creatinine Equation (2021)    eGFR  Date Value Ref Range Status  06/19/2022 34 (L) > OR = 60 mL/min/1.10m2 Final  10/30/2021 15 (L) >59 mL/min/1.73 Final         Passed - Valid encounter within last 6 months    Recent Outpatient Visits           2 months ago Acute viral syndrome   [place] [facility] [place], Netta Neat, DO   3 months ago Type 2 diabetes mellitus with hyperlipidemia [facility])   [place] [facility] [place], Netta Neat, DO   7 months ago Type 2 diabetes mellitus with hyperlipidemia [facility])   [place] [facility] [place] Smitty Cords, DO   9 months ago Chronic right shoulder pain   [place] [facility] [place], Netta Neat, DO   1 year ago Type 2 diabetes mellitus with hyperlipidemia [facility])   [place] [facility] Althea Charon, Netta Neat, DO       Future Appointments             In 2 months Althea Charon,  Netta Neat, DO [place] [facility], [facility]   In 2 months Althea Charon, Netta Neat, DO [place] [facility], [facility]             gabapentin (NEURONTIN) 300 MG capsule 180 capsule 1    Sig: Take 1 capsule (300 mg total) by mouth 2 (two) times daily.     Neurology: Anticonvulsants - gabapentin Failed - 10/08/2023 11:47 AM      Failed - Cr in normal range and within 360 days    Creat  Date Value Ref Range Status  06/19/2022 1.59 (H) 0.60 - 1.00 mg/dL Final   Creatinine, Ser  Date Value Ref Range Status  07/12/2023 1.49 (H) 0.44 - 1.00 mg/dL Final         Passed - Completed PHQ-2 or PHQ-9 in the last 360 days      Passed - Valid encounter within last 12 months    Recent Outpatient Visits           2 months ago Acute viral syndrome   [place] [facility],  Netta Neat, DO   3 months ago Type 2 diabetes mellitus with hyperlipidemia [facility])   [place] [facility] [place], Netta Neat, DO   7 months ago Type 2 diabetes mellitus with hyperlipidemia [facility])   [place] [facility] Smitty Cords, DO   9 months ago Chronic right shoulder pain   [place] [facility] Smitty Cords, DO   1 year ago Type 2 diabetes mellitus with hyperlipidemia [facility])   [place] [facility] Althea Charon, Netta Neat, DO       Future Appointments             In 2 months Althea Charon, Netta Neat, DO [place] [facility], [facility]   In 2 months Althea Charon, Netta Neat, DO [place] [facility], [place]

## 2023-10-20 ENCOUNTER — Other Ambulatory Visit: Payer: Self-pay | Admitting: Family Medicine

## 2023-10-20 DIAGNOSIS — F5101 Primary insomnia: Secondary | ICD-10-CM

## 2023-10-21 NOTE — Telephone Encounter (Signed)
 Requested medications are due for refill today.  yes  Requested medications are on the active medications list.  yes  Last refill. 05/12/2023 #30 2 rf  Future visit scheduled.   yes  Notes to clinic.  Refill not delegated.    Requested Prescriptions  Pending Prescriptions Disp Refills   temazepam (RESTORIL) 30 MG capsule [Pharmacy Med Name: TEMAZEPAM 30 MG CAPSULE] 30 capsule 2    Sig: Take 1 capsule (30 mg total) by mouth at bedtime.     Not Delegated - Psychiatry: Anxiolytics/Hypnotics 2 Failed - 10/21/2023  4:07 PM      Failed - This refill cannot be delegated      Failed - Urine Drug Screen completed in last 360 days      Passed - Patient is not pregnant      Passed - Valid encounter within last 6 months    Recent Outpatient Visits           3 months ago Acute viral syndrome   [place] [facility] [place], Netta Neat, DO   4 months ago Type 2 diabetes mellitus with hyperlipidemia [facility])   [place] [facility], Netta Neat, DO   8 months ago Type 2 diabetes mellitus with hyperlipidemia [facility])   [place] [facility] Smitty Cords, DO   9 months ago Chronic right shoulder pain   [place] [facility] [place], Netta Neat, DO   1 year ago Type 2 diabetes mellitus with hyperlipidemia [facility])   [place] [facility] [place], Netta Neat, DO       Future Appointments             In 1 month Althea Charon, Netta Neat, DO [place] [facility], [facility]   In 1 month Althea Charon, Netta Neat, DO  [facility], [place]

## 2023-10-28 ENCOUNTER — Ambulatory Visit (INDEPENDENT_AMBULATORY_CARE_PROVIDER_SITE_OTHER): Payer: Medicare Other

## 2023-10-28 DIAGNOSIS — I442 Atrioventricular block, complete: Secondary | ICD-10-CM | POA: Diagnosis not present

## 2023-10-28 LAB — CUP PACEART REMOTE DEVICE CHECK
Battery Remaining Longevity: 125 mo
Battery Voltage: 3.02 V
Brady Statistic AP VP Percent: 96.45 %
Brady Statistic AP VS Percent: 0.01 %
Brady Statistic AS VP Percent: 3.33 %
Brady Statistic AS VS Percent: 0.2 %
Brady Statistic RA Percent Paced: 84.29 %
Brady Statistic RV Percent Paced: 99.8 %
Date Time Interrogation Session: 20250325062833
Implantable Lead Connection Status: 753985
Implantable Lead Connection Status: 753985
Implantable Lead Implant Date: 20141217
Implantable Lead Implant Date: 20141217
Implantable Lead Location: 753859
Implantable Lead Location: 753860
Implantable Lead Model: 5076
Implantable Lead Model: 5076
Implantable Pulse Generator Implant Date: 20230926
Lead Channel Impedance Value: 437 Ohm
Lead Channel Impedance Value: 456 Ohm
Lead Channel Impedance Value: 494 Ohm
Lead Channel Impedance Value: 532 Ohm
Lead Channel Pacing Threshold Amplitude: 0.375 V
Lead Channel Pacing Threshold Amplitude: 0.625 V
Lead Channel Pacing Threshold Pulse Width: 0.4 ms
Lead Channel Pacing Threshold Pulse Width: 0.4 ms
Lead Channel Sensing Intrinsic Amplitude: 3.375 mV
Lead Channel Sensing Intrinsic Amplitude: 3.375 mV
Lead Channel Sensing Intrinsic Amplitude: 9.25 mV
Lead Channel Sensing Intrinsic Amplitude: 9.25 mV
Lead Channel Setting Pacing Amplitude: 1.5 V
Lead Channel Setting Pacing Amplitude: 2 V
Lead Channel Setting Pacing Pulse Width: 0.4 ms
Lead Channel Setting Sensing Sensitivity: 2 mV
Zone Setting Status: 755011

## 2023-11-11 DIAGNOSIS — H40003 Preglaucoma, unspecified, bilateral: Secondary | ICD-10-CM | POA: Diagnosis not present

## 2023-11-11 DIAGNOSIS — H2513 Age-related nuclear cataract, bilateral: Secondary | ICD-10-CM | POA: Diagnosis not present

## 2023-12-11 DIAGNOSIS — E1122 Type 2 diabetes mellitus with diabetic chronic kidney disease: Secondary | ICD-10-CM | POA: Diagnosis not present

## 2023-12-11 DIAGNOSIS — N184 Chronic kidney disease, stage 4 (severe): Secondary | ICD-10-CM | POA: Diagnosis not present

## 2023-12-11 LAB — HEMOGLOBIN A1C: Hemoglobin A1C: 7

## 2023-12-12 DIAGNOSIS — E1122 Type 2 diabetes mellitus with diabetic chronic kidney disease: Secondary | ICD-10-CM | POA: Diagnosis not present

## 2023-12-12 DIAGNOSIS — N184 Chronic kidney disease, stage 4 (severe): Secondary | ICD-10-CM | POA: Diagnosis not present

## 2023-12-12 NOTE — Progress Notes (Signed)
 Remote pacemaker transmission.

## 2023-12-15 ENCOUNTER — Ambulatory Visit: Payer: Self-pay | Admitting: Family Medicine

## 2023-12-16 ENCOUNTER — Ambulatory Visit (INDEPENDENT_AMBULATORY_CARE_PROVIDER_SITE_OTHER): Payer: Self-pay | Admitting: Family Medicine

## 2023-12-16 ENCOUNTER — Encounter: Payer: Self-pay | Admitting: Family Medicine

## 2023-12-16 VITALS — BP 122/80 | HR 62 | Ht 59.0 in | Wt 149.4 lb

## 2023-12-16 DIAGNOSIS — N184 Chronic kidney disease, stage 4 (severe): Secondary | ICD-10-CM | POA: Diagnosis not present

## 2023-12-16 DIAGNOSIS — I129 Hypertensive chronic kidney disease with stage 1 through stage 4 chronic kidney disease, or unspecified chronic kidney disease: Secondary | ICD-10-CM | POA: Diagnosis not present

## 2023-12-16 DIAGNOSIS — E1169 Type 2 diabetes mellitus with other specified complication: Secondary | ICD-10-CM

## 2023-12-16 DIAGNOSIS — L209 Atopic dermatitis, unspecified: Secondary | ICD-10-CM | POA: Diagnosis not present

## 2023-12-16 DIAGNOSIS — L853 Xerosis cutis: Secondary | ICD-10-CM

## 2023-12-16 DIAGNOSIS — E785 Hyperlipidemia, unspecified: Secondary | ICD-10-CM

## 2023-12-16 DIAGNOSIS — N2581 Secondary hyperparathyroidism of renal origin: Secondary | ICD-10-CM | POA: Diagnosis not present

## 2023-12-16 DIAGNOSIS — E1122 Type 2 diabetes mellitus with diabetic chronic kidney disease: Secondary | ICD-10-CM | POA: Diagnosis not present

## 2023-12-16 DIAGNOSIS — E875 Hyperkalemia: Secondary | ICD-10-CM | POA: Diagnosis not present

## 2023-12-16 DIAGNOSIS — R809 Proteinuria, unspecified: Secondary | ICD-10-CM | POA: Diagnosis not present

## 2023-12-16 DIAGNOSIS — Z7984 Long term (current) use of oral hypoglycemic drugs: Secondary | ICD-10-CM | POA: Diagnosis not present

## 2023-12-16 NOTE — Progress Notes (Signed)
 Subjective:    Patient ID: Renee Boyd, female    DOB: Oct 18, 1950, 73 y.o.   MRN: [id number]  Renee Boyd is a 73 y.o. female presenting on 12/16/2023 for Diabetes   HPI  Discussed the use of AI scribe software for clinical note transcription with the patient, who gave verbal consent to proceed.  History of Present Illness   Renee Boyd is a 73 year old female with diabetes who presents for follow-up of her A1c levels and increasing muscle cramps.  She experiences increasing muscle cramps in either leg, sometimes severe enough to cause fear of stretching. These cramps occur both during the day and at night, with a recent increase in nighttime occurrences. She takes baclofen  once daily, which provides some relief but is not completely effective. She has not tried any over-the-counter supplements for cramping due to the number of medications she already takes.  Her sleep pattern is irregular, often sleeping from 3 PM to 7 PM and then again from 3 AM to 7 AM, totaling about 7 to 8 hours of sleep per day.  She has a persistent itch on her left shoulder blade, treated with desonide  lotion and a moisturizer, without noticeable improvement. Her daughter-in-law examined the area and did not notice any visible abnormalities. The itch is described as feeling like 'something grabbed my skin.'  She experiences twitching in her right eye without any vision changes such as blurriness or loss of vision. The twitching occurs more when she is tired or has strained her vision.  She mentions neck pain that began last Friday, described as stiffness and occasional 'crook' in her neck. She uses a hot/cold pad for relief and notes that baclofen  should help with this as well.      Chronic Right Shoulder and Neck Pain Osteoarthritis, multiple joints Back Pain / Hip Pain / Shoulder pain Chronic problem. Episodic painful flares of joint pain, worse with inc activity and storms and weather  changes Previously managed on Tramadol  PRN for pain and this was renally adjusted to max of 50mg  BID - still has if needs, may request re order in future - she reports it has been ineffective now for shoulder pain  Cannot take NSAIDs due to CKD. On Gabapentin  300mg  daily doing well. Using baclofen  5mg  only max dose nightly She wakes up with pain worse at night R shoulder, has flares difficulty raising arm.   Follow[facility] She will continue to work with Pain Management   Type 2 Diabetes Last lab A1c 7.0, has apt in 2 days Followed [facility] currently due to advancing renal disease. On Jardiance 10mg  daily Off Metformin  On Jardiance 10mg  - per Nephrology Off Metformin  due to CKD Off Rybelsus  due to nausea   CKD-IV with hyperkalemia and subnephrotic proteinuria HTN Secondary Anemia of chronic kidney disease  Secondary to DM HTN Followed by Dr Lamount Pimple    Persistent Atrial Fibrillation, s/p Cardioversion On Anticoagulation with Eliquis  Complete Heart Block Precordial Chest Pain Followed by Dr Richardo Chandler She had Cardioversion in March 2022, unsuccessful.   Chronic Insomnia Primary problem difficulty sleeping. She takes Temazepam  nightly PRN. Will need refill in future      12/16/2023    9:35 AM 07/21/2023   11:03 AM 06/16/2023    9:13 AM  Depression screen PHQ 2/9  Decreased Interest 3 0 0  Down, Depressed, Hopeless 0 0 0  PHQ - 2 Score 3 0 0  Altered sleeping 0  0  Tired, decreased energy 0  0  Change in appetite 0  0  Feeling bad or failure about yourself  0  0  Trouble concentrating 0  0  Moving slowly or fidgety/restless 0  0  Suicidal thoughts 0  0  PHQ-9 Score 3  0  Difficult doing work/chores Not difficult at all         12/16/2023    9:36 AM 07/21/2023   11:03 AM 06/16/2023    9:13 AM 02/11/2023    8:44 AM  GAD 7 : Generalized Anxiety Score  Nervous, Anxious, on Edge 0 0 0 0  Control/stop worrying 0 0 0 0  Worry too much -  different things 0 0 0 0  Trouble relaxing 0 0 0 0  Restless 0 0 0 0  Easily annoyed or irritable 0 0 0 0  Afraid - awful might happen 0 0 0 0  Total GAD 7 Score 0 0 0 0  Anxiety Difficulty Not difficult at all   Not difficult at all    Social History   Tobacco Use   Smoking status: Former    Current packs/day: 0.00    Average packs/day: 2.0 packs/day for 40.0 years (80.0 ttl pk-yrs)    Types: Cigarettes    Start date: 06/27/1965    Quit date: 06/27/2005    Years since quitting: 18.4   Smokeless tobacco: Former   Tobacco comments:    quit Jun 27 2005  Vaping Use   Vaping status: Never Used  Substance Use Topics   Alcohol use: No   Drug use: No    Review of Systems Per HPI unless specifically indicated above     Objective:     BP 122/80 (BP Location: Left Arm, Patient Position: Sitting, Cuff Size: Normal)   Pulse 62   Ht 4\' 11"  (1.499 m)   Wt 149 lb 6 oz (67.8 kg)   BMI 30.17 kg/m   Wt Readings from Last 3 Encounters:  12/16/23 149 lb 6 oz (67.8 kg)  07/21/23 153 lb (69.4 kg)  07/12/23 153 lb (69.4 kg)    Physical Exam Vitals and nursing note reviewed.  Constitutional:      General: She is not in acute distress.    Appearance: Normal appearance. She is well-developed. She is obese. She is not diaphoretic.     Comments: Well-appearing, comfortable, cooperative  HENT:     Head: Normocephalic and atraumatic.  Eyes:     General:        Right eye: No discharge.        Left eye: No discharge.     Conjunctiva/sclera: Conjunctivae normal.  Cardiovascular:     Rate and Rhythm: Normal rate.  Pulmonary:     Effort: Pulmonary effort is normal.  Skin:    General: Skin is warm and dry.     Findings: No erythema or rash.  Neurological:     Mental Status: She is alert and oriented to person, place, and time.  Psychiatric:        Mood and Affect: Mood normal.        Behavior: Behavior normal.        Thought Content: Thought content normal.     Comments: Well  groomed, good eye contact, normal speech and thoughts     Results for orders placed or performed in visit on 12/16/23  Hemoglobin A1c   Collection Time: 12/11/23 12:00 AM  Result Value Ref Range   Hemoglobin A1C  7.0       Assessment & Plan:   Problem List Items Addressed This Visit     Type 2 diabetes mellitus with hyperlipidemia (HCC) - Primary   Other Visit Diagnoses       Atopic dermatitis, unspecified type         Dry skin dermatitis         Long term current use of oral hypoglycemic drug            Muscle cramps Increasing frequency of leg cramps, partially relieved by baclofen . Discussed OTC options like Hyland's tablets and mustard. Cramping often related to muscle and nutrient issues. - Continue baclofen . - Use Hyland's leg cramp tablets as needed. - Consider mustard as a remedy.  Itchy skin on left shoulder blade Persistent itching without visible rash or infection. Desonide  lotion ineffective. Possible dry skin, no fungal or yeast infection. - Apply Vaseline for moisturization. - Continue desonide  lotion. - Contact for stronger topical prescription if needed.  Eye twitching Intermittent right eye twitching likely due to muscle spasms. No vision changes. Adequate rest important. - Ensure adequate rest.  Type 2 diabetes mellitus Hemoglobin A1c is 7.0, slightly increased from 6.9. Under Dr. Arnette Bias care. Aware of maintaining A1c at 7.0 or below. - Follow up with Dr. Lorelei Rogers on Thursday for diabetes management.        Orders Placed This Encounter  Procedures   Hemoglobin A1c    This external order was created through the Results Console.    No orders of the defined types were placed in this encounter.   Follow up plan: Return in about 6 months (around 06/17/2024) for 6 month Annual Physical possible lab after.  Domingo Friend, DO [facility] 12/16/2023, 9:43 AM

## 2023-12-16 NOTE — Patient Instructions (Addendum)
 Thank you for coming to the office today.  Leg cramps - Try spoonful of yellow mustard to relieve leg cramps or try daily to prevent the problem  - OTC natural option is Hyland's Leg Cramps (Dissolving tablet) take as needed for muscle cramps  Recommend using the Vaseline for moisturizing on the back  If not effective, call back and we can order a stronger topical than Desonide .  Recent Labs    06/05/23 0000 12/11/23 0000  HGBA1C 6.9  6.9 7.0      Please schedule a Follow-up Appointment to: Return in about 6 months (around 06/17/2024) for 6 month Annual Physical possible lab after.  If you have any other questions or concerns, please feel free to call the office or send a message through MyChart. You may also schedule an earlier appointment if necessary.  Additionally, you may be receiving a survey about your experience at our office within a few days to 1 week by e-mail or mail. We value your feedback.  Domingo Friend, DO [facility] [place], [place]

## 2023-12-18 DIAGNOSIS — M810 Age-related osteoporosis without current pathological fracture: Secondary | ICD-10-CM | POA: Diagnosis not present

## 2023-12-18 DIAGNOSIS — E1122 Type 2 diabetes mellitus with diabetic chronic kidney disease: Secondary | ICD-10-CM | POA: Diagnosis not present

## 2023-12-18 DIAGNOSIS — N184 Chronic kidney disease, stage 4 (severe): Secondary | ICD-10-CM | POA: Diagnosis not present

## 2023-12-18 DIAGNOSIS — M8588 Other specified disorders of bone density and structure, other site: Secondary | ICD-10-CM | POA: Diagnosis not present

## 2023-12-18 DIAGNOSIS — I10 Essential (primary) hypertension: Secondary | ICD-10-CM | POA: Diagnosis not present

## 2023-12-18 DIAGNOSIS — E1159 Type 2 diabetes mellitus with other circulatory complications: Secondary | ICD-10-CM | POA: Diagnosis not present

## 2023-12-18 DIAGNOSIS — E213 Hyperparathyroidism, unspecified: Secondary | ICD-10-CM | POA: Diagnosis not present

## 2023-12-24 DIAGNOSIS — N2581 Secondary hyperparathyroidism of renal origin: Secondary | ICD-10-CM | POA: Diagnosis not present

## 2023-12-24 DIAGNOSIS — E1122 Type 2 diabetes mellitus with diabetic chronic kidney disease: Secondary | ICD-10-CM | POA: Diagnosis not present

## 2023-12-24 DIAGNOSIS — E8729 Other acidosis: Secondary | ICD-10-CM | POA: Diagnosis not present

## 2023-12-24 DIAGNOSIS — E875 Hyperkalemia: Secondary | ICD-10-CM | POA: Diagnosis not present

## 2023-12-24 DIAGNOSIS — R809 Proteinuria, unspecified: Secondary | ICD-10-CM | POA: Diagnosis not present

## 2023-12-24 DIAGNOSIS — N184 Chronic kidney disease, stage 4 (severe): Secondary | ICD-10-CM | POA: Diagnosis not present

## 2023-12-24 DIAGNOSIS — I129 Hypertensive chronic kidney disease with stage 1 through stage 4 chronic kidney disease, or unspecified chronic kidney disease: Secondary | ICD-10-CM | POA: Diagnosis not present

## 2023-12-30 ENCOUNTER — Telehealth: Payer: Self-pay | Admitting: Internal Medicine

## 2023-12-30 NOTE — Telephone Encounter (Signed)
 Patient is calling to speak with the nurse. She is demanding someone give her a call back by 5pm today. She states that if she doesn't she is unplugging her device from the way. Please advise

## 2023-12-30 NOTE — Telephone Encounter (Signed)
 Returned patient call  Patient states they have "the problem" at the same exact time every morning at 0244 and they have to put their hand over their heart to "calm it down"  This RN requested a manual transmission to review  Presenting EGM regular rate AP/VP in the 70's  Impedance, sensing and threshold values and trends all stable  2 AF episodes noted with well controlled V-rates  Confirmed with patient that her issue is likely not with her pacemaker  Patient appreciative of the call back and review of transmission with her.  Patient encouraged to call with any additional questions

## 2024-01-12 ENCOUNTER — Other Ambulatory Visit: Payer: Self-pay

## 2024-01-12 ENCOUNTER — Other Ambulatory Visit: Payer: Self-pay | Admitting: Family Medicine

## 2024-01-12 DIAGNOSIS — R0609 Other forms of dyspnea: Secondary | ICD-10-CM

## 2024-01-12 DIAGNOSIS — L209 Atopic dermatitis, unspecified: Secondary | ICD-10-CM

## 2024-01-12 MED ORDER — COMBIVENT RESPIMAT 20-100 MCG/ACT IN AERS: INHALATION_SPRAY | RESPIRATORY_TRACT | 2 refills | Status: DC

## 2024-01-12 MED ORDER — DESONIDE 0.05 % EX LOTN: TOPICAL_LOTION | Freq: Two times a day (BID) | CUTANEOUS | 2 refills | Status: AC

## 2024-01-12 NOTE — Telephone Encounter (Signed)
 Requested Prescriptions  Pending Prescriptions Disp Refills   COMBIVENT  RESPIMAT 20-100 MCG/ACT AERS respimat 4 g 2    Sig: INHALE 1 PUFF EVERY 6 HOURS AS NEEDED FOR WHEEZING/SOB     Pulmonology:  Combination Products - albuterol  / ipratropium Passed - 01/12/2024  3:19 PM      Passed - Last BP in normal range    BP Readings from Last 1 Encounters:  12/16/23 122/80         Passed - Last Heart Rate in normal range    Pulse Readings from Last 1 Encounters:  12/16/23 62         Passed - Valid encounter within last 12 months    Recent Outpatient Visits           3 weeks ago Type 2 diabetes mellitus with hyperlipidemia [facility])   [place] [facility] [place], [place], [place]

## 2024-01-12 NOTE — Telephone Encounter (Signed)
 Copied from CRM [id number]. Topic: Clinical - Medication Refill >> Jan 12, 2024  9:54 AM Bridgette Campus T wrote: Medication: COMBIVENT  RESPIMAT 20-100 MCG/ACT AERS respimat  Has the patient contacted their pharmacy? No (Agent: If no, request that the patient contact the pharmacy for the refill. If patient does not wish to contact the pharmacy document the reason why and proceed with request.) (Agent: If yes, when and what did the pharmacy advise?)  This is the patient's preferred pharmacy:    [id number] [place], [place] - [address] [place] [address] Phone: [phone] Fax: [phone]   Is this the correct pharmacy for this prescription? Yes If no, delete pharmacy and type the correct one.   Has the prescription been filled recently? Yes  Is the patient out of the medication? No  Has the patient been seen for an appointment in the last year OR does the patient have an upcoming appointment? Yes  Can we respond through MyChart? No  Agent: Please be advised that Rx refills may take up to 3 business days. We ask that you follow-up with your pharmacy.

## 2024-01-14 DIAGNOSIS — E213 Hyperparathyroidism, unspecified: Secondary | ICD-10-CM | POA: Diagnosis not present

## 2024-01-15 NOTE — Telephone Encounter (Signed)
 Explained to patient that remote transmission appears normal device function.  During the time she wakes up due to waking up to palpitations her device could be running daily test. Offered patient device clinic apt to see about changing the time within the device to daytime hours if available. Patient states she has an upcoming apt and is fine to wait until that apt. States she was fine after talking to [facility] then discussed with her friend and got worried again. Advised patient to call if back she decides to change her mind about a device clinic apt. Pt was appreciative of call back.

## 2024-01-15 NOTE — Telephone Encounter (Signed)
 Patient is calling in regard to this issue. Patient feels as if she is needing more clarity on what is going on. Please advise.

## 2024-01-20 ENCOUNTER — Other Ambulatory Visit: Payer: Self-pay | Admitting: Family Medicine

## 2024-01-20 DIAGNOSIS — F5101 Primary insomnia: Secondary | ICD-10-CM

## 2024-01-22 ENCOUNTER — Telehealth: Payer: Self-pay

## 2024-01-22 ENCOUNTER — Other Ambulatory Visit: Payer: Self-pay | Admitting: Family Medicine

## 2024-01-22 DIAGNOSIS — M150 Primary generalized (osteo)arthritis: Secondary | ICD-10-CM

## 2024-01-22 DIAGNOSIS — G8929 Other chronic pain: Secondary | ICD-10-CM

## 2024-01-22 DIAGNOSIS — F5101 Primary insomnia: Secondary | ICD-10-CM

## 2024-01-22 MED ORDER — TEMAZEPAM 30 MG PO CAPS: 30.0000 mg | ORAL_CAPSULE | Freq: Every day | ORAL | 2 refills | Status: DC

## 2024-01-22 NOTE — Telephone Encounter (Signed)
 Patient advised.

## 2024-01-22 NOTE — Telephone Encounter (Signed)
 I was able to find both of her refill requests and signed both refills for 30 day + 2 [facility]  The Temazepam  rx refill had not yet been routed to our clinical team. So I sent a new order and cancelled the pending refill.  The Tramadol  had been requested today, so it was in process. I have signed the order  Domingo Friend, DO [facility] 01/22/2024, 12:18 PM

## 2024-01-22 NOTE — Telephone Encounter (Signed)
 Patient called in stating that she was at the pharmacy trying to pick up prescriptions for her Tramadol  and sleep medication. She did not know the name of the other medication she was requesting. Please advise if okay for refills. Thanks!

## 2024-01-27 ENCOUNTER — Ambulatory Visit: Payer: Medicare Other

## 2024-01-27 ENCOUNTER — Other Ambulatory Visit: Payer: Self-pay | Admitting: Family Medicine

## 2024-01-27 DIAGNOSIS — I442 Atrioventricular block, complete: Secondary | ICD-10-CM

## 2024-01-27 DIAGNOSIS — K219 Gastro-esophageal reflux disease without esophagitis: Secondary | ICD-10-CM

## 2024-01-28 LAB — CUP PACEART REMOTE DEVICE CHECK
Battery Remaining Longevity: 123 mo
Battery Voltage: 3.02 V
Brady Statistic AP VP Percent: 98.82 %
Brady Statistic AP VS Percent: 0.01 %
Brady Statistic AS VP Percent: 1.07 %
Brady Statistic AS VS Percent: 0.1 %
Brady Statistic RA Percent Paced: 98.08 %
Brady Statistic RV Percent Paced: 99.9 %
Date Time Interrogation Session: 20250623235840
Implantable Lead Connection Status: 753985
Implantable Lead Connection Status: 753985
Implantable Lead Implant Date: 20141217
Implantable Lead Implant Date: 20141217
Implantable Lead Location: 753859
Implantable Lead Location: 753860
Implantable Lead Model: 5076
Implantable Lead Model: 5076
Implantable Pulse Generator Implant Date: 20230926
Lead Channel Impedance Value: 418 Ohm
Lead Channel Impedance Value: 437 Ohm
Lead Channel Impedance Value: 551 Ohm
Lead Channel Impedance Value: 589 Ohm
Lead Channel Pacing Threshold Amplitude: 0.375 V
Lead Channel Pacing Threshold Amplitude: 0.5 V
Lead Channel Pacing Threshold Pulse Width: 0.4 ms
Lead Channel Pacing Threshold Pulse Width: 0.4 ms
Lead Channel Sensing Intrinsic Amplitude: 2.625 mV
Lead Channel Sensing Intrinsic Amplitude: 2.625 mV
Lead Channel Sensing Intrinsic Amplitude: 9.25 mV
Lead Channel Sensing Intrinsic Amplitude: 9.25 mV
Lead Channel Setting Pacing Amplitude: 1.5 V
Lead Channel Setting Pacing Amplitude: 2 V
Lead Channel Setting Pacing Pulse Width: 0.4 ms
Lead Channel Setting Sensing Sensitivity: 2 mV
Zone Setting Status: 755011

## 2024-01-28 NOTE — Telephone Encounter (Signed)
 Requested Prescriptions  Pending Prescriptions Disp Refills   omeprazole  (PRILOSEC) 20 MG capsule [Pharmacy Med Name: OMEPRAZOLE  DR CAPS 20MG ] 180 capsule 1    Sig: TAKE 1 CAPSULE TWICE A DAY BEFORE MEALS     Gastroenterology: Proton Pump Inhibitors Passed - 01/28/2024 12:34 PM      Passed - Valid encounter within last 12 months    Recent Outpatient Visits           1 month ago Type 2 diabetes mellitus with hyperlipidemia [facility])   [place] [facility] [place], [name], [place]

## 2024-02-09 ENCOUNTER — Other Ambulatory Visit: Payer: Self-pay | Admitting: Family Medicine

## 2024-02-09 DIAGNOSIS — R252 Cramp and spasm: Secondary | ICD-10-CM

## 2024-02-10 ENCOUNTER — Other Ambulatory Visit: Payer: Self-pay | Admitting: Internal Medicine

## 2024-02-11 NOTE — Telephone Encounter (Signed)
 Requested Prescriptions  Pending Prescriptions Disp Refills   Baclofen  5 MG TABS [Pharmacy Med Name: BACLOFEN  TABS 5MG ] 90 tablet 3    Sig: TAKE 1 TABLET AT BEDTIME     Analgesics:  Muscle Relaxants - baclofen  Failed - 02/11/2024  1:27 PM      Failed - Cr in normal range and within 180 days    Creat  Date Value Ref Range Status  06/19/2022 1.59 (H) 0.60 - 1.00 mg/dL Final   Creatinine, Ser  Date Value Ref Range Status  07/12/2023 1.49 (H) 0.44 - 1.00 mg/dL Final         Failed - eGFR is 30 or above and within 180 days    GFR, Est African American  Date Value Ref Range Status  12/29/2018 33 (L) > OR = 60 mL/min/1.29m2 Final   GFR calc Af Amer  Date Value Ref Range Status  02/25/2020 23 (L) >60 mL/min Final   GFR, Est Non African American  Date Value Ref Range Status  12/29/2018 29 (L) > OR = 60 mL/min/1.59m2 Final   GFR, Estimated  Date Value Ref Range Status  07/12/2023 37 (L) >60 mL/min Final    Comment:    (NOTE) Calculated using the CKD-EPI Creatinine Equation (2021)    eGFR  Date Value Ref Range Status  06/19/2022 34 (L) > OR = 60 mL/min/1.29m2 Final  10/30/2021 15 (L) >59 mL/min/1.73 Final         Passed - Valid encounter within last 6 months    Recent Outpatient Visits           1 month ago Type 2 diabetes mellitus with hyperlipidemia [facility])   [place] [facility] [place], [name], DO

## 2024-03-15 NOTE — Progress Notes (Signed)
 Electrophysiology Office Note:   Date:  03/16/2024  ID:  Renee Boyd, DOB 1951/02/25, MRN [id number]  Primary Cardiologist: None Electrophysiologist: None      History of Present Illness:   Renee Boyd is a 73 y.o. female with h/o persistent atrial fibrillation, sinus node dysfunction s/p pacemaker, hypertension, chronic diastolic heart failure who is being seen today for pacemaker follow up.  Discussed the use of AI scribe software for clinical note transcription with the patient, who gave verbal consent to proceed.  History of Present Illness Renee Boyd is a 73 year old female with a pacemaker who presents for a routine check-up of her device.  She is concerned about the pacemaker's automatic diagnostic tests, which occur at night and disturb her sleep. She describes a variable sleep schedule, sometimes staying up late watching TV. She describes being 'very upset' when she first noticed the tests in February, initially thinking she heard the device. Living alone, she worries about potential issues with the pacemaker going unnoticed. She takes sleeping pills, making it difficult to understand what is happening during the night when the tests occur. Her pacemaker was originally implanted on July 03, 2005, and she has had battery replacements since then.   She sometimes feels her heart fluttering or skipping but these usually last only for seconds.  She denies any longer episodes.. She describes episodes of dizziness, such as one instance when she got dizzy after a shower and had to sit down. These episodes are now 'much less' frequent and 'smaller' than before.  She is currently taking a water pill and reports no significant swelling. She acknowledges that her diet could be better but states she eats in moderation. Her diabetes is managed with an A1c of 7.7%.  She lives alone and is retired.    Review of systems complete and found to be negative unless listed in HPI.   EP  Information / Studies Reviewed:    EKG is ordered today. Personal review as below.  EKG Interpretation Date/Time:  Tuesday March 16 2024 10:24:02 EDT Ventricular Rate:  67 PR Interval:  178 QRS Duration:  148 QT Interval:  428 QTC Calculation: 452 R Axis:   -81  Text Interpretation: AV dual-paced rhythm When compared with ECG of 27-May-2023 10:30, Vent. rate has increased BY   5 BPM Confirmed by Kennyth Chew [phone]) on 03/16/2024 10:28:10 AM   Nuclear Stress 12/2020:  Pharmacological myocardial perfusion imaging study with no significant  Ischemia Attenuation correction images with fixed apical defect (mildly better with stress compared to rest), unable to exclude processing artifact Non attenuation correction images, apical defect is markedly less notable GI uptake artifact noted Normal wall motion, EF estimated at 80% CT attenuation correction images with pacer wire x2 on the right, mild diffuse aortic atherosclerosis, mild three-vessel coronary calcification No EKG changes concerning for ischemia at peak stress or in recovery. Low risk scan   Physical Exam:   VS:  BP 116/73   Pulse 67   Ht 4' 11 (1.499 m)   Wt 149 lb 3.2 oz (67.7 kg)   BMI 30.13 kg/m    Wt Readings from Last 3 Encounters:  03/16/24 149 lb 3.2 oz (67.7 kg)  12/16/23 149 lb 6 oz (67.8 kg)  07/21/23 153 lb (69.4 kg)     GEN: Well nourished, well developed in no acute distress NECK: No JVD CARDIAC: Normal rate, regular rhythm.  Well-healed left chest pacer pocket. RESPIRATORY:  Clear to auscultation without rales,  wheezing or rhonchi  ABDOMEN: Soft, non-distended EXTREMITIES:  No edema; No deformity   ASSESSMENT AND PLAN:    #S/p pacemaker: #Sinus node dysfunction: #Complete heart block: - In clinic device interrogation was performed today.  Please see Paceart for full report.  Appropriate device function and stable lead parameters. - Continue remote monitoring.  She dislikes automatic diagnostic  testing which occurs overnight and sometimes interferes with her sleep.  We will inquire about changing the timing of her diagnostic testing.  She understands the importance of normal maintenance algorithms associated with the device.  #Persistent atrial fibrillation: Currently in sinus rhythm.  Burden has increased from 1% to 15%.  There appears to be no cardiac awareness of her arrhythmia. #Secondary hypercoagulable state due to AF: No bleeding issues. - Continue Eliquis  5mg  BID.  - Continue metoprolol  50 mg daily.  #Chronic diastolic heart failure: Well compensated on exam.  - Patient does not have general cardiologist.  She had been started on spironolactone and Jardiance by Dr. Fernande.  We will refer her to establish with one of my general cardiology colleagues.    Follow up with Dr. Kennyth in 12 months  Signed, Fonda Kennyth, MD

## 2024-03-16 ENCOUNTER — Encounter: Payer: Self-pay | Admitting: Cardiology

## 2024-03-16 ENCOUNTER — Ambulatory Visit: Attending: Cardiology | Admitting: Cardiology

## 2024-03-16 VITALS — BP 116/73 | HR 67 | Ht 59.0 in | Wt 149.2 lb

## 2024-03-16 DIAGNOSIS — Z950 Presence of cardiac pacemaker: Secondary | ICD-10-CM | POA: Insufficient documentation

## 2024-03-16 DIAGNOSIS — I442 Atrioventricular block, complete: Secondary | ICD-10-CM | POA: Diagnosis not present

## 2024-03-16 DIAGNOSIS — I5032 Chronic diastolic (congestive) heart failure: Secondary | ICD-10-CM | POA: Diagnosis not present

## 2024-03-16 DIAGNOSIS — I480 Paroxysmal atrial fibrillation: Secondary | ICD-10-CM | POA: Insufficient documentation

## 2024-03-16 DIAGNOSIS — D6869 Other thrombophilia: Secondary | ICD-10-CM | POA: Diagnosis not present

## 2024-03-16 DIAGNOSIS — I4819 Other persistent atrial fibrillation: Secondary | ICD-10-CM | POA: Insufficient documentation

## 2024-03-16 DIAGNOSIS — I495 Sick sinus syndrome: Secondary | ICD-10-CM | POA: Diagnosis not present

## 2024-03-16 LAB — CUP PACEART INCLINIC DEVICE CHECK
Date Time Interrogation Session: 20250812110100
Implantable Lead Connection Status: 753985
Implantable Lead Connection Status: 753985
Implantable Lead Implant Date: 20061129
Implantable Lead Implant Date: 20061129
Implantable Lead Location: 753859
Implantable Lead Location: 753860
Implantable Lead Model: 5076
Implantable Lead Model: 5076
Implantable Pulse Generator Implant Date: 20230926

## 2024-03-16 NOTE — Patient Instructions (Signed)
 Medication Instructions:  Your physician recommends that you continue on your current medications as directed. Please refer to the Current Medication list given to you today.  *If you need a refill on your cardiac medications before your next appointment, please call your pharmacy*  Follow-Up: At [facility], you and your health needs are our priority.  As part of our continuing mission to provide you with exceptional heart care, our providers are all part of one team.  This team includes your primary Cardiologist (physician) and Advanced Practice Providers or APPs (Physician Assistants and Nurse Practitioners) who all work together to provide you with the care you need, when you need it.  Your next appointment:   1 year  Provider:   You may see Dr. Sidra Kitty or one of the following Advanced Practice Providers on your designated Care Team:   Charlies Arthur, PA-C Michael Andy Tillery, PA-C Suzann Riddle, NP Daphne Barrack, NP  You have been referred to a general cardiologist

## 2024-03-18 ENCOUNTER — Ambulatory Visit: Payer: Self-pay | Admitting: Cardiology

## 2024-03-23 DIAGNOSIS — M810 Age-related osteoporosis without current pathological fracture: Secondary | ICD-10-CM | POA: Diagnosis not present

## 2024-03-23 DIAGNOSIS — E213 Hyperparathyroidism, unspecified: Secondary | ICD-10-CM | POA: Diagnosis not present

## 2024-03-23 DIAGNOSIS — N184 Chronic kidney disease, stage 4 (severe): Secondary | ICD-10-CM | POA: Diagnosis not present

## 2024-03-23 DIAGNOSIS — E1122 Type 2 diabetes mellitus with diabetic chronic kidney disease: Secondary | ICD-10-CM | POA: Diagnosis not present

## 2024-03-23 LAB — COMPREHENSIVE METABOLIC PANEL WITH GFR: eGFR: 22

## 2024-03-23 LAB — HEMOGLOBIN A1C: Hemoglobin A1C: 6.8

## 2024-03-23 LAB — BASIC METABOLIC PANEL WITH GFR: Creatinine: 2.3 — AB (ref 0.5–1.1)

## 2024-03-25 ENCOUNTER — Other Ambulatory Visit: Payer: Self-pay | Admitting: Family Medicine

## 2024-03-25 DIAGNOSIS — E1169 Type 2 diabetes mellitus with other specified complication: Secondary | ICD-10-CM

## 2024-03-26 NOTE — Telephone Encounter (Signed)
 Requested Prescriptions  Pending Prescriptions Disp Refills   allopurinol  (ZYLOPRIM ) 100 MG tablet [Pharmacy Med Name: ALLOPURINOL  TABS 100MG ] 90 tablet 0    Sig: TAKE 1 TABLET DAILY     Endocrinology:  Gout Agents - allopurinol  Failed - 03/26/2024  8:16 AM      Failed - Uric Acid in normal range and within 360 days    Uric Acid, Serum  Date Value Ref Range Status  03/23/2018 5.8 2.5 - 7.0 mg/dL Final    Comment:    Therapeutic target for gout patients: <6.0 mg/dL .    Uric Acid  Date Value Ref Range Status  11/11/2016 9.7 (H) 2.5 - 7.1 mg/dL Final    Comment:               Therapeutic target for gout patients: <6.0         Failed - Cr in normal range and within 360 days    Creat  Date Value Ref Range Status  06/19/2022 1.59 (H) 0.60 - 1.00 mg/dL Final   Creatinine, Ser  Date Value Ref Range Status  07/12/2023 1.49 (H) 0.44 - 1.00 mg/dL Final         Failed - CBC within normal limits and completed in the last 12 months    WBC  Date Value Ref Range Status  07/12/2023 2.5 (L) 4.0 - 10.5 K/uL Final   RBC  Date Value Ref Range Status  07/12/2023 4.81 3.87 - 5.11 MIL/uL Final   Hemoglobin  Date Value Ref Range Status  07/12/2023 14.2 12.0 - 15.0 g/dL Final  89/76/7980 88.6 11.1 - 15.9 g/dL Final   HCT  Date Value Ref Range Status  07/12/2023 42.9 36.0 - 46.0 % Final   Hematocrit  Date Value Ref Range Status  05/27/2018 35.3 34.0 - 46.6 % Final   MCHC  Date Value Ref Range Status  07/12/2023 33.1 30.0 - 36.0 g/dL Final   [facility]  Date Value Ref Range Status  07/12/2023 29.5 26.0 - 34.0 pg Final   MCV  Date Value Ref Range Status  07/12/2023 89.2 80.0 - 100.0 fL Final  05/27/2018 88 79 - 97 fL Final  07/13/2013 89 80 - 100 fL Final   No results found for: PLTCOUNTKUC, LABPLAT, POCPLA RDW  Date Value Ref Range Status  07/12/2023 15.9 (H) 11.5 - 15.5 % Final  05/27/2018 15.0 12.3 - 15.4 % Final  07/13/2013 14.6 (H) 11.5 - 14.5 % Final          Passed - Valid encounter within last 12 months    Recent Outpatient Visits           3 months ago Type 2 diabetes mellitus with hyperlipidemia [facility])   [place] [facility] [place], [name], DO

## 2024-03-30 DIAGNOSIS — E213 Hyperparathyroidism, unspecified: Secondary | ICD-10-CM | POA: Diagnosis not present

## 2024-03-30 DIAGNOSIS — I10 Essential (primary) hypertension: Secondary | ICD-10-CM | POA: Diagnosis not present

## 2024-03-30 DIAGNOSIS — E1159 Type 2 diabetes mellitus with other circulatory complications: Secondary | ICD-10-CM | POA: Diagnosis not present

## 2024-03-30 DIAGNOSIS — Z1331 Encounter for screening for depression: Secondary | ICD-10-CM | POA: Diagnosis not present

## 2024-03-30 DIAGNOSIS — E1122 Type 2 diabetes mellitus with diabetic chronic kidney disease: Secondary | ICD-10-CM | POA: Diagnosis not present

## 2024-03-30 DIAGNOSIS — Z780 Asymptomatic menopausal state: Secondary | ICD-10-CM | POA: Diagnosis not present

## 2024-03-30 DIAGNOSIS — N184 Chronic kidney disease, stage 4 (severe): Secondary | ICD-10-CM | POA: Diagnosis not present

## 2024-03-30 DIAGNOSIS — M8580 Other specified disorders of bone density and structure, unspecified site: Secondary | ICD-10-CM | POA: Diagnosis not present

## 2024-04-02 ENCOUNTER — Ambulatory Visit: Payer: Medicare Other

## 2024-04-02 ENCOUNTER — Other Ambulatory Visit: Payer: Self-pay | Admitting: Family Medicine

## 2024-04-02 DIAGNOSIS — Z0000 Encounter for general adult medical examination without abnormal findings: Secondary | ICD-10-CM | POA: Diagnosis not present

## 2024-04-02 DIAGNOSIS — E039 Hypothyroidism, unspecified: Secondary | ICD-10-CM

## 2024-04-02 MED ORDER — LEVOTHYROXINE SODIUM 25 MCG PO TABS: 25.0000 ug | ORAL_TABLET | Freq: Every day | ORAL | 3 refills | Status: AC

## 2024-04-02 NOTE — Progress Notes (Signed)
 Subjective:   Renee Boyd is a 73 y.o. who presents for a Medicare Wellness preventive visit.  As a reminder, Annual Wellness Visits don't include a physical exam, and some assessments may be limited, especially if this visit is performed virtually. We may recommend an in-person follow-up visit with your provider if needed.  Visit Complete: Virtual I connected with  Renee Boyd on 04/02/24 by a audio enabled telemedicine application and verified that I am speaking with the correct person using two identifiers.  Patient Location: Home  Provider Location: Home Office  I discussed the limitations of evaluation and management by telemedicine. The patient expressed understanding and agreed to proceed.  Vital Signs: Because this visit was a virtual/telehealth visit, some criteria may be missing or patient reported. Any vitals not documented were not able to be obtained and vitals that have been documented are patient reported.  VideoDeclined- This patient declined Librarian, academic. Therefore the visit was completed with audio only.  Persons Participating in Visit: Patient.  AWV Questionnaire: No: Patient Medicare AWV questionnaire was not completed prior to this visit.  Cardiac Risk Factors include: advanced age (>90men, >94 women);diabetes mellitus;dyslipidemia;hypertension;sedentary lifestyle;obesity (BMI >30kg/m2)     Objective:    Today's Vitals   04/02/24 1524  PainSc: 4    There is no height or weight on file to calculate BMI.     04/02/2024    3:41 PM 07/12/2023    3:10 PM 03/28/2023    3:24 PM 02/19/2023    2:18 PM 01/15/2023    9:42 AM 01/06/2023    2:55 PM 09/26/2022    9:25 AM  Advanced Directives  Does Patient Have a Medical Advance Directive? Yes No Yes Yes Yes Yes Yes  Type of Estate agent of [place];Living will  Healthcare Power of [place];Living will      Does patient want to make changes to medical advance  directive? No - Patient declined  No - Patient declined   No - Patient declined   Copy of Healthcare Power of Attorney in Chart? Yes - validated most recent copy scanned in chart (See row information)  Yes - validated most recent copy scanned in chart (See row information)      Would patient like information on creating a medical advance directive?  No - Patient declined         Current Medications (verified) Outpatient Encounter Medications as of 04/02/2024  Medication Sig   acetaminophen  (TYLENOL ) 500 MG tablet Take 1,000 mg by mouth every 8 (eight) hours as needed.   albuterol  (PROVENTIL  HFA) 108 (90 Base) MCG/ACT inhaler Inhale 2 puffs into the lungs every 4 (four) hours as needed for wheezing or shortness of breath.   allopurinol  (ZYLOPRIM ) 100 MG tablet TAKE 1 TABLET DAILY   amLODipine  (NORVASC ) 10 MG tablet TAKE 1 TABLET DAILY   apixaban  (ELIQUIS ) 5 MG TABS tablet Take 1 tablet (5 mg total) by mouth 2 (two) times daily.   ascorbic acid  (VITAMIN C) 1000 MG tablet Take 1,000 mg by mouth daily.   atorvastatin  (LIPITOR) 20 MG tablet TAKE 1 TABLET DAILY   Baclofen  5 MG TABS TAKE 1 TABLET AT BEDTIME   COMBIVENT  RESPIMAT 20-100 MCG/ACT AERS respimat INHALE 1 PUFF EVERY 6 HOURS AS NEEDED FOR WHEEZING/SOB   CONTOUR NEXT TEST test strip Use to check blood sugar up to twice a day.   desonide  (DESOWEN ) 0.05 % lotion Apply topically 2 (two) times daily.   Dulaglutide  (TRULICITY   ) Inject into the skin.   furosemide  (LASIX ) 20 MG tablet TAKE 3 TABLETS ONCE DAILY   gabapentin  (NEURONTIN ) 300 MG capsule Take 1 capsule (300 mg total) by mouth 2 (two) times daily. (Patient taking differently: Take 300 mg by mouth 2 (two) times daily. TAKES ONCE PER DAY)   JARDIANCE 10 MG TABS tablet Take 10 mg by mouth daily.   levothyroxine  (SYNTHROID ) 25 MCG tablet Take 1 tablet (25 mcg total) by mouth daily before breakfast.   loperamide  (IMODIUM  A-D) 2 MG tablet Take 2 tablets (4 mg total) by mouth 4 (four) times  daily as needed for diarrhea or loose stools.   metoprolol  succinate (TOPROL -XL) 50 MG 24 hr tablet Take 1 tablet (50 mg total) by mouth daily. Take with or immediately following a meal.   Multiple Vitamin (MULTIVITAMIN) capsule Take 1 capsule by mouth daily.   omeprazole  (PRILOSEC) 20 MG capsule TAKE 1 CAPSULE TWICE A DAY BEFORE MEALS   ondansetron  (ZOFRAN -ODT) 4 MG disintegrating tablet Take 1 tablet (4 mg total) by mouth every 8 (eight) hours as needed for nausea or vomiting.   spironolactone (ALDACTONE) 25 MG tablet Take 25 mg by mouth daily.   telmisartan (MICARDIS) 20 MG tablet Take 20 mg by mouth daily.   temazepam  (RESTORIL ) 30 MG capsule Take 1 capsule (30 mg total) by mouth at bedtime.   traMADol  (ULTRAM ) 50 MG tablet TAKE 1 TABLET BY MOUTH THREE TIMES A DAY   No facility-administered encounter medications on file as of 04/02/2024.    Allergies (verified) Morphine and codeine, Neosporin [bacitracin-polymyxin b], Other, Percocet [oxycodone-acetaminophen ], and Tirzepatide    History: Past Medical History:  Diagnosis Date   Complete heart block (HCC)    a. s/p MDT PPM 2006 with device generator replacement 2014; b. followed by Dr. Fernande   [facility])    GERD (gastroesophageal reflux disease)    Hypertension    Hypothyroid    Pacemaker    Persistent atrial fibrillation (HCC)    a. on Eliquis ; b. CHADS2VASc 6 (HTN. age x 1, DM, TIA x 2, female); c. s/p DCCV 05/28/18   Pulmonary hypertension (HCC)    a. echo 2017: EF of 60-65%, no RWMA, mildly dilated left atrium, RVSF normal, PASP 64 mmHg   TIA (transient ischemic attack)    Past Surgical History:  Procedure Laterality Date   ABDOMINAL HYSTERECTOMY     CARDIOVERSION N/A 05/28/2018   Procedure: CARDIOVERSION;  Surgeon: Perla Evalene PARAS, MD;  Location: [facility] ORS;  Service: Cardiovascular;  Laterality: N/A;   CARDIOVERSION N/A 03/01/2020   Procedure: CARDIOVERSION;  Surgeon: Darliss Rogue, MD;  Location: [facility] ORS;   Service: Cardiovascular;  Laterality: N/A;   CARDIOVERSION N/A 10/25/2020   Procedure: CARDIOVERSION;  Surgeon: Darliss Rogue, MD;  Location: [facility] ORS;  Service: Cardiovascular;  Laterality: N/A;   CHOLECYSTECTOMY     PPM GENERATOR CHANGEOUT N/A 04/30/2022   Procedure: PPM GENERATOR CHANGEOUT;  Surgeon: Fernande Elspeth BROCKS, MD;  Location: [facility] INVASIVE CV LAB;  Service: Cardiovascular;  Laterality: N/A;   TONSILLECTOMY     TUBAL LIGATION     Family History  Problem Relation Age of Onset   Hypertension Other    Heart disease Mother        MI   Alcohol abuse Father    Autism Son    Heart disease Maternal Grandmother    Cancer Son        colon   Miscarriages / Stillbirths Maternal Aunt    Social History  Socioeconomic History   Marital status: Divorced    Spouse name: Not on file   Number of children: 3   Years of education: Not on file   Highest education level: High school graduate  Occupational History   Occupation: retired  Tobacco Use   Smoking status: Former    Current packs/day: 0.00    Average packs/day: 2.0 packs/day for 40.0 years (80.0 ttl pk-yrs)    Types: Cigarettes    Start date: 06/27/1965    Quit date: 06/27/2005    Years since quitting: 18.7   Smokeless tobacco: Former   Tobacco comments:    quit Jun 27 2005  Vaping Use   Vaping status: Never Used  Substance and Sexual Activity   Alcohol use: No   Drug use: No   Sexual activity: Not Currently  Other Topics Concern   Not on file  Social History Narrative   Lives by self.   Social Drivers of Corporate investment banker Strain: Low Risk  (04/02/2024)   Overall Financial Resource Strain (CARDIA)    Difficulty of Paying Living Expenses: Not hard at all  Food Insecurity: No Food Insecurity (04/02/2024)   Hunger Vital Sign    Worried About Running Out of Food in the Last Year: Never true    Ran Out of Food in the Last Year: Never true  Transportation Needs: No Transportation Needs (04/02/2024)    PRAPARE - Administrator, Civil Service (Medical): No    Lack of Transportation (Non-Medical): No  Physical Activity: Inactive (04/02/2024)   Exercise Vital Sign    Days of Exercise per Week: 0 days    Minutes of Exercise per Session: 0 min  Stress: No Stress Concern Present (04/02/2024)   [employer] of Occupational Health - Occupational Stress Questionnaire    Feeling of Stress: Only a little  Social Connections: Moderately Integrated (04/02/2024)   Social Connection and Isolation Panel    Frequency of Communication with Friends and Family: More than three times a week    Frequency of Social Gatherings with Friends and Family: Twice a week    Attends Religious Services: More than 4 times per year    Active Member of [employer] or Organizations: Yes    Attends Engineer, structural: More than 4 times per year    Marital Status: Divorced    Tobacco Counseling Counseling given: Not Answered Tobacco comments: quit Jun 27 2005    Clinical Intake:  Pre-visit preparation completed: Yes  Pain : 0-10 Pain Score: 4  Pain Type: Chronic pain Pain Location: Back Pain Orientation: Lower Pain Radiating Towards: PAIN MED Pain Descriptors / Indicators: Aching, Constant Pain Onset: More than a month ago Pain Frequency: Constant Pain Relieving Factors: PAIN MED  Pain Relieving Factors: PAIN MED  BMI - recorded: 30.1 Nutritional Status: BMI > 30  Obese Nutritional Risks: None Diabetes: Yes CBG done?: No Did pt. bring in CBG monitor from home?: No  Lab Results  Component Value Date   HGBA1C 7.0 12/11/2023   HGBA1C 6.9 06/05/2023   HGBA1C 6.9 06/05/2023     How often do you need to have someone help you when you read instructions, pamphlets, or other written materials from your doctor or pharmacy?: 1 - Never  Interpreter Needed?: No  Information entered by :: JHONNIE DAS, LPN   Activities of Daily Living     04/02/2024    3:42 PM 06/16/2023    9:14 AM   In your present  state of health, do you have any difficulty performing the following activities:  Hearing? 0 0  Vision? 0 0  Difficulty concentrating or making decisions? 0 0  Walking or climbing stairs? 0 0  Dressing or bathing? 0 0  Doing errands, shopping? 0 0  Preparing Food and eating ? N   Using the Toilet? N   In the past six months, have you accidently leaked urine? N   Do you have problems with loss of bowel control? N   Managing your Medications? N   Managing your Finances? N   Housekeeping or managing your Housekeeping? N     Patient Care Team: Edman [name], DO as PCP - General (Family Medicine) Douglas Rule, MD as Consulting Physician (Nephrology) Fernande Elspeth BROCKS, MD as Consulting Physician (Cardiology) Janit Thresa HERO, DPM as Consulting Physician (Podiatry) Mittie Gaskin, MD as Referring Physician (Ophthalmology)  I have updated your Care Teams any recent Medical Services you may have received from other providers in the past year.     Assessment:   This is a routine wellness examination for Renee Boyd.  Hearing/Vision screen Hearing Screening - Comments:: NO AIDS Vision Screening - Comments:: WEARS GLASSES ALL DAY- DR.BRASINGTON   Goals Addressed             This Visit's Progress    DIET - REDUCE SUGAR INTAKE         Depression Screen     04/02/2024    3:40 PM 04/02/2024    3:36 PM 12/16/2023    9:35 AM 07/21/2023   11:03 AM 06/16/2023    9:13 AM 03/28/2023    3:21 PM 02/19/2023    2:18 PM  PHQ 2/9 Scores  PHQ - 2 Score 1 0 3 0 0 2 0  PHQ- 9 Score 1 0 3  0 3     Fall Risk     04/02/2024    3:42 PM 12/16/2023    9:35 AM 07/21/2023   11:03 AM 06/16/2023    9:13 AM 03/28/2023    3:25 PM  Fall Risk   Falls in the past year? 0 0 0 0 0  Number falls in past yr: 0    0  Injury with Fall? 0    0  Risk for fall due to : No Fall Risks    No Fall Risks  Follow up Falls evaluation completed;Falls prevention discussed    Falls  prevention discussed;Falls evaluation completed    MEDICARE RISK AT HOME:  Medicare Risk at Home Any stairs in or around the home?: Yes If so, are there any without handrails?: No Home free of loose throw rugs in walkways, pet beds, electrical cords, etc?: Yes Adequate lighting in your home to reduce risk of falls?: Yes Life alert?: No Use of a cane, walker or w/c?: No Grab bars in the bathroom?: No Shower chair or bench in shower?: No Elevated toilet seat or a handicapped toilet?: No  TIMED UP AND GO:  Was the test performed?  No  Cognitive Function: 6CIT completed        04/02/2024    3:44 PM 03/28/2023    3:30 PM 03/25/2022    2:24 PM 03/20/2021   11:23 AM 03/14/2020    2:29 PM  6CIT Screen  What Year? 0 points 0 points 0 points 0 points 0 points  What month? 0 points 0 points 0 points 0 points 0 points  What time? 0 points 0 points 0 points 0 points  0 points  Count back from 20 0 points 0 points 0 points 0 points 2 points  Months in reverse 0 points 0 points 0 points 0 points 0 points  Repeat phrase 0 points 0 points 0 points 2 points 2 points  Total Score 0 points 0 points 0 points 2 points 4 points    Immunizations Immunization History  Administered Date(s) Administered   PFIZER Comirnaty(Gray Top)Covid-19 Tri-Sucrose Vaccine 10/18/2019, 11/09/2019   PFIZER(Purple Top)SARS-COV-2 Vaccination 10/18/2019, 11/09/2019, 07/11/2020   Pneumococcal Conjugate-13 09/10/2016   Pneumococcal Polysaccharide-23 09/16/2017   Tdap 04/06/2015    Screening Tests Health Maintenance  Topic Date Due   Zoster Vaccines- Shingrix (1 of 2) Never done   MAMMOGRAM  Never done   COVID-19 Vaccine (6 - 2024-25 season) 04/06/2023   INFLUENZA VACCINE  03/05/2024   Colonoscopy  07/05/2024   OPHTHALMOLOGY EXAM  05/18/2024   Diabetic kidney evaluation - Urine ACR  06/11/2024   HEMOGLOBIN A1C  06/12/2024   FOOT EXAM  06/15/2024   Diabetic kidney evaluation - eGFR measurement  07/11/2024    Medicare Annual Wellness (AWV)  04/02/2025   DTaP/Tdap/Td (2 - Td or Tdap) 04/05/2025   Pneumococcal Vaccine: 50+ Years  Completed   DEXA SCAN  Completed   Hepatitis C Screening  Completed   HPV VACCINES  Aged Out   Meningococcal B Vaccine  Aged Out    Health Maintenance  Health Maintenance Due  Topic Date Due   Zoster Vaccines- Shingrix (1 of 2) Never done   MAMMOGRAM  Never done   COVID-19 Vaccine (6 - 2024-25 season) 04/06/2023   INFLUENZA VACCINE  03/05/2024   Colonoscopy  07/05/2024   Health Maintenance Items Addressed: WANTS NO MORE COVID; NEEDS SHINGRIX; DECLINES MAMMOGRAM; DECLINES COLONOSCOPY; DECLINES BDS  Additional Screening:  Vision Screening: Recommended annual ophthalmology exams for early detection of glaucoma and other disorders of the eye. Would you like a referral to an eye doctor? No    Dental Screening: Recommended annual dental exams for proper oral hygiene  Community Resource Referral / Chronic Care Management: CRR required this visit?  No   CCM required this visit?  No   Plan:    I have personally reviewed and noted the following in the patient's chart:   Medical and social history Use of alcohol, tobacco or illicit drugs  Current medications and supplements including opioid prescriptions. Patient is not currently taking opioid prescriptions. Functional ability and status Nutritional status Physical activity Advanced directives List of other physicians Hospitalizations, surgeries, and ER visits in previous 12 months Vitals Screenings to include cognitive, depression, and falls Referrals and appointments  In addition, I have reviewed and discussed with patient certain preventive protocols, quality metrics, and best practice recommendations. A written personalized care plan for preventive services as well as general preventive health recommendations were provided to patient.   Jhonnie GORMAN Das, LPN   [id number]/[id number]   After Visit Summary:  (MyChart) Due to this being a telephonic visit, the after visit summary with patients personalized plan was offered to patient via MyChart   Notes: Nothing significant to report at this time.

## 2024-04-02 NOTE — Patient Instructions (Signed)
 Renee Boyd , Thank you for taking time out of your busy schedule to complete your Annual Wellness Visit with me. I enjoyed our conversation and look forward to speaking with you again next year. I, as well as your care team,  appreciate your ongoing commitment to your health goals. Please review the following plan we discussed and let me know if I can assist you in the future.  Follow up Visits: 04/15/25 @ 4:00 PM BY PHONE We will see or speak with you next year for your Next Medicare AWV with our clinical staff Have you seen your provider in the last 6 months (3 months if uncontrolled diabetes)? Yes  Clinician Recommendations:  Aim for 30 minutes of exercise or brisk walking, 6-8 glasses of water, and 5 servings of fruits and vegetables each day. TAKE CARE!      This is a list of the screenings recommended for you:  Health Maintenance  Topic Date Due   Zoster (Shingles) Vaccine (1 of 2) Never done   Mammogram  Never done   COVID-19 Vaccine (6 - 2024-25 season) 04/06/2023   Flu Shot  03/05/2024   Colon Cancer Screening  07/05/2024   Eye exam for diabetics  05/18/2024   Yearly kidney health urinalysis for diabetes  06/11/2024   Hemoglobin A1C  06/12/2024   Complete foot exam   06/15/2024   Yearly kidney function blood test for diabetes  07/11/2024   Medicare Annual Wellness Visit  04/02/2025   DTaP/Tdap/Td vaccine (2 - Td or Tdap) 04/05/2025   Pneumococcal Vaccine for age over 58  Completed   DEXA scan (bone density measurement)  Completed   Hepatitis C Screening  Completed   HPV Vaccine  Aged Out   Meningitis B Vaccine  Aged Out    Advanced directives: (In Chart) A copy of your advanced directives are scanned into your chart should your provider ever need it. Advance Care Planning is important because it:  [x]  Makes sure you receive the medical care that is consistent with your values, goals, and preferences  [x]  It provides guidance to your family and loved ones and reduces their  decisional burden about whether or not they are making the right decisions based on your wishes.  Follow the link provided in your after visit summary or read over the paperwork we have mailed to you to help you started getting your Advance Directives in place. If you need assistance in completing these, please reach out to us  so that we can help you!

## 2024-04-05 ENCOUNTER — Other Ambulatory Visit: Payer: Self-pay | Admitting: Internal Medicine

## 2024-04-05 DIAGNOSIS — I4819 Other persistent atrial fibrillation: Secondary | ICD-10-CM

## 2024-04-06 ENCOUNTER — Other Ambulatory Visit: Payer: Self-pay

## 2024-04-06 NOTE — Telephone Encounter (Signed)
 Eliquis  5mg  refill request received. Patient is 73 years old, weight-67.7kg, Crea-1.49 on 07/12/23, Diagnosis-Afib, and last seen by Dr. Kennyth on 03/16/24. Dose is appropriate based on dosing criteria. Will send in refill to requested pharmacy.

## 2024-04-14 ENCOUNTER — Other Ambulatory Visit: Payer: Self-pay | Admitting: Surgery

## 2024-04-14 DIAGNOSIS — E213 Hyperparathyroidism, unspecified: Secondary | ICD-10-CM

## 2024-04-14 DIAGNOSIS — R7989 Other specified abnormal findings of blood chemistry: Secondary | ICD-10-CM | POA: Diagnosis not present

## 2024-04-21 ENCOUNTER — Inpatient Hospital Stay
Admission: RE | Admit: 2024-04-21 | Discharge: 2024-04-21 | Disposition: A | Discharge status: Home / Self Care | Source: Ambulatory Visit | Attending: Surgery | Admitting: Surgery

## 2024-04-21 DIAGNOSIS — E213 Hyperparathyroidism, unspecified: Secondary | ICD-10-CM

## 2024-04-21 MED ORDER — IOPAMIDOL (ISOVUE-300) INJECTION 61%: 75.0000 mL | Freq: Once | INTRAVENOUS | Status: DC | PRN

## 2024-04-22 ENCOUNTER — Other Ambulatory Visit: Payer: Self-pay | Admitting: Surgery

## 2024-04-22 DIAGNOSIS — E213 Hyperparathyroidism, unspecified: Secondary | ICD-10-CM

## 2024-04-22 DIAGNOSIS — E214 Other specified disorders of parathyroid gland: Secondary | ICD-10-CM

## 2024-04-22 DIAGNOSIS — R7989 Other specified abnormal findings of blood chemistry: Secondary | ICD-10-CM

## 2024-04-26 ENCOUNTER — Telehealth: Payer: Self-pay

## 2024-04-26 ENCOUNTER — Other Ambulatory Visit: Payer: Self-pay | Admitting: Family Medicine

## 2024-04-26 DIAGNOSIS — F5101 Primary insomnia: Secondary | ICD-10-CM

## 2024-04-26 NOTE — Telephone Encounter (Signed)
 Copied from CRM [id number]. Topic: Clinical - Medication Question >> Apr 26, 2024  9:56 AM Revonda D wrote: Reason for CRM: Pt is calling to check the status of the refill request for the temazepam . I informed the pt that the request is currently pending. Pt would like a callback once the medication has been approved.

## 2024-04-27 ENCOUNTER — Ambulatory Visit (INDEPENDENT_AMBULATORY_CARE_PROVIDER_SITE_OTHER): Payer: Medicare Other

## 2024-04-27 ENCOUNTER — Other Ambulatory Visit: Payer: Self-pay

## 2024-04-27 ENCOUNTER — Other Ambulatory Visit: Payer: Self-pay | Admitting: Family Medicine

## 2024-04-27 DIAGNOSIS — R252 Cramp and spasm: Secondary | ICD-10-CM

## 2024-04-27 DIAGNOSIS — F5101 Primary insomnia: Secondary | ICD-10-CM

## 2024-04-27 DIAGNOSIS — I4819 Other persistent atrial fibrillation: Secondary | ICD-10-CM | POA: Diagnosis not present

## 2024-04-27 NOTE — Telephone Encounter (Signed)
 Copied from CRM #[id number]. Topic: Clinical - Medication Question >> Apr 27, 2024 12:55 PM [name] wrote: Reason for CRM: pt wants Dr. MARLA to go ahead and approve the temazepam  for [facility]

## 2024-04-27 NOTE — Telephone Encounter (Signed)
 Requested medication (s) are due for refill today: yes  Requested medication (s) are on the active medication list: yes  Last refill:  01/22/24  Future visit scheduled: yes  Notes to clinic:  Unable to refill per protocol, cannot delegate.      Requested Prescriptions  Pending Prescriptions Disp Refills   temazepam  (RESTORIL ) 30 MG capsule [Pharmacy Med Name: TEMAZEPAM  30 MG CAPSULE] 30 capsule 2    Sig: Take 1 capsule (30 mg total) by mouth at bedtime.     Not Delegated - Psychiatry: Anxiolytics/Hypnotics 2 Failed - 04/27/2024 11:30 AM      Failed - This refill cannot be delegated      Failed - Urine Drug Screen completed in last 360 days      Passed - Patient is not pregnant      Passed - Valid encounter within last 6 months    Recent Outpatient Visits           4 months ago Type 2 diabetes mellitus with hyperlipidemia [facility])   [facility] [place], [name], [place]

## 2024-04-27 NOTE — Addendum Note (Signed)
 Addended by: ZELIA GAUZE D on: 04/27/2024 01:06 PM   Modules accepted: Orders

## 2024-04-28 LAB — CUP PACEART REMOTE DEVICE CHECK
Battery Remaining Longevity: 120 mo
Battery Voltage: 3.01 V
Brady Statistic AP VP Percent: 96.82 %
Brady Statistic AP VS Percent: 0.01 %
Brady Statistic AS VP Percent: 2.84 %
Brady Statistic AS VS Percent: 0.32 %
Brady Statistic RA Percent Paced: 96.99 %
Brady Statistic RV Percent Paced: 99.66 %
Date Time Interrogation Session: 20250923004022
Implantable Lead Connection Status: 753985
Implantable Lead Connection Status: 753985
Implantable Lead Implant Date: 20061129
Implantable Lead Implant Date: 20061129
Implantable Lead Location: 753859
Implantable Lead Location: 753860
Implantable Lead Model: 5076
Implantable Lead Model: 5076
Implantable Pulse Generator Implant Date: 20230926
Lead Channel Impedance Value: 437 Ohm
Lead Channel Impedance Value: 475 Ohm
Lead Channel Impedance Value: 513 Ohm
Lead Channel Impedance Value: 570 Ohm
Lead Channel Pacing Threshold Amplitude: 0.375 V
Lead Channel Pacing Threshold Amplitude: 0.625 V
Lead Channel Pacing Threshold Pulse Width: 0.4 ms
Lead Channel Pacing Threshold Pulse Width: 0.4 ms
Lead Channel Sensing Intrinsic Amplitude: 2.375 mV
Lead Channel Sensing Intrinsic Amplitude: 2.375 mV
Lead Channel Sensing Intrinsic Amplitude: 9.25 mV
Lead Channel Sensing Intrinsic Amplitude: 9.25 mV
Lead Channel Setting Pacing Amplitude: 1.5 V
Lead Channel Setting Pacing Amplitude: 2 V
Lead Channel Setting Pacing Pulse Width: 0.4 ms
Lead Channel Setting Sensing Sensitivity: 2 mV
Zone Setting Status: 755011

## 2024-04-28 NOTE — Telephone Encounter (Signed)
 Requested medication (s) are due for refill today: yes  Requested medication (s) are on the active medication list: yes  Last refill:  02/11/24 #90  Future visit scheduled: yes  Notes to clinic:  creatinineoverdue    Requested Prescriptions  Pending Prescriptions Disp Refills   Baclofen  5 MG TABS [Pharmacy Med Name: BACLOFEN  TABS 5MG ] 90 tablet 3    Sig: TAKE 1 TABLET AT BEDTIME     Analgesics:  Muscle Relaxants - baclofen  Failed - 04/28/2024  1:59 PM      Failed - Cr in normal range and within 180 days    Creat  Date Value Ref Range Status  06/19/2022 1.59 (H) 0.60 - 1.00 mg/dL Final   Creatinine, Ser  Date Value Ref Range Status  07/12/2023 1.49 (H) 0.44 - 1.00 mg/dL Final         Failed - eGFR is 30 or above and within 180 days    GFR, Est African American  Date Value Ref Range Status  12/29/2018 33 (L) > OR = 60 mL/min/1.81m2 Final   GFR calc Af Amer  Date Value Ref Range Status  02/25/2020 23 (L) >60 mL/min Final   GFR, Est Non African American  Date Value Ref Range Status  12/29/2018 29 (L) > OR = 60 mL/min/1.62m2 Final   GFR, Estimated  Date Value Ref Range Status  07/12/2023 37 (L) >60 mL/min Final    Comment:    (NOTE) Calculated using the CKD-EPI Creatinine Equation (2021)    eGFR  Date Value Ref Range Status  06/19/2022 34 (L) > OR = 60 mL/min/1.61m2 Final  10/30/2021 15 (L) >59 mL/min/1.73 Final         Passed - Valid encounter within last 6 months    Recent Outpatient Visits           4 months ago Type 2 diabetes mellitus with hyperlipidemia [facility])   [place] [facility] [place], [name], DO

## 2024-04-28 NOTE — Progress Notes (Signed)
 Remote PPM Transmission

## 2024-04-29 ENCOUNTER — Other Ambulatory Visit (HOSPITAL_COMMUNITY): Payer: Self-pay | Admitting: Surgery

## 2024-04-29 DIAGNOSIS — R7989 Other specified abnormal findings of blood chemistry: Secondary | ICD-10-CM

## 2024-04-29 DIAGNOSIS — E213 Hyperparathyroidism, unspecified: Secondary | ICD-10-CM

## 2024-04-29 DIAGNOSIS — E214 Other specified disorders of parathyroid gland: Secondary | ICD-10-CM

## 2024-05-03 NOTE — Progress Notes (Signed)
 Remote PPM Transmission

## 2024-05-03 NOTE — Addendum Note (Signed)
 Addended by: VICCI SELLER A on: 05/03/2024 02:28 PM   Modules accepted: Orders

## 2024-05-05 DIAGNOSIS — H40003 Preglaucoma, unspecified, bilateral: Secondary | ICD-10-CM | POA: Diagnosis not present

## 2024-05-06 NOTE — CV Procedure (Signed)
  Device system confirmed to be MRI conditional, with implant date > 6 weeks ago, and no evidence of abandoned or epicardial leads in review of most recent CXR  Device last cleared by EP Provider: Prentice Passey 05/06/24  Clearance is good through for 1 year as long as parameters remain stable at time of check. If pt undergoes a cardiac device procedure during that time, they should be re-cleared.   Tachy-therapies to be programmed off if applicable with device back to pre-MRI settings after completion of exam.  [employer] - Programming recommendation received through [employer] App/Tablet  Rocky Catalan, RT  05/06/2024 8:55 AM

## 2024-05-11 ENCOUNTER — Ambulatory Visit (HOSPITAL_COMMUNITY)
Admission: RE | Admit: 2024-05-11 | Discharge: 2024-05-11 | Disposition: A | Discharge status: Home / Self Care | Source: Ambulatory Visit | Attending: Surgery | Admitting: Surgery

## 2024-05-11 DIAGNOSIS — E214 Other specified disorders of parathyroid gland: Secondary | ICD-10-CM | POA: Diagnosis not present

## 2024-05-11 DIAGNOSIS — R7989 Other specified abnormal findings of blood chemistry: Secondary | ICD-10-CM | POA: Diagnosis present

## 2024-05-11 DIAGNOSIS — E213 Hyperparathyroidism, unspecified: Secondary | ICD-10-CM | POA: Insufficient documentation

## 2024-05-11 MED ORDER — GADOBUTROL 1 MMOL/ML IV SOLN
6.5000 mL | Freq: Once | INTRAVENOUS | Status: AC | PRN
  Administered 2024-05-11: 6.5 mL via INTRAVENOUS

## 2024-05-11 NOTE — Progress Notes (Signed)
 Patient was monitored by this RN during MRI scan due to presence of a pacemaker. Cardiac rhythm was continuously monitored throughout the procedure. Prior to the start of the scan, the pacemaker was placed in MRI-safe mode by the MRI technician and/or pacemaker representative. Following the completion of the scan, the device was returned to its pre-MRI settings. Neurological status and orientation post-procedure were unchanged from baseline.   Pre-procedure Heart Rate (Prior to being placed in MRI safe mode): 62 Post-procedure Heart Rate (Once pacemaker is returned to baseline mode): 62

## 2024-05-18 DIAGNOSIS — E119 Type 2 diabetes mellitus without complications: Secondary | ICD-10-CM | POA: Diagnosis not present

## 2024-05-18 DIAGNOSIS — H40003 Preglaucoma, unspecified, bilateral: Secondary | ICD-10-CM | POA: Diagnosis not present

## 2024-05-18 DIAGNOSIS — H43813 Vitreous degeneration, bilateral: Secondary | ICD-10-CM | POA: Diagnosis not present

## 2024-05-18 DIAGNOSIS — H2513 Age-related nuclear cataract, bilateral: Secondary | ICD-10-CM | POA: Diagnosis not present

## 2024-05-18 LAB — OPHTHALMOLOGY REPORT-SCANNED

## 2024-05-19 ENCOUNTER — Encounter: Payer: Self-pay | Admitting: Family Medicine

## 2024-05-30 ENCOUNTER — Ambulatory Visit: Payer: Self-pay | Admitting: Cardiology

## 2024-06-15 ENCOUNTER — Ambulatory Visit: Payer: Self-pay | Admitting: Surgery

## 2024-06-15 ENCOUNTER — Other Ambulatory Visit: Payer: Self-pay | Admitting: Internal Medicine

## 2024-06-15 NOTE — Progress Notes (Signed)
 Telephone conversation with the patient regarding results of imaging studies.  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  Unable to do CT scan due to CKD.  Discussed neck exploration for possible parathyroidectomy.  Increased risk given anticoagulation, cardiac disease, CKD, age.  At this time, patient does not wish to have surgery.  Will plan to see patient in follow up in September 2026 and review labs and symptoms and again discuss possible options for management.  Burn[place] - please arrange follow up as above.  Krystal Spinner, MD [facility] practice Office: [phone]

## 2024-06-17 MED ORDER — METOPROLOL SUCCINATE ER 50 MG PO TB24
50.0000 mg | ORAL_TABLET | Freq: Every day | ORAL | 2 refills | Status: AC
Start: 2024-06-17 — End: ?

## 2024-06-18 ENCOUNTER — Encounter: Admitting: Family Medicine

## 2024-06-23 ENCOUNTER — Ambulatory Visit: Admitting: Family Medicine

## 2024-06-23 ENCOUNTER — Other Ambulatory Visit: Payer: Self-pay | Admitting: Family Medicine

## 2024-06-23 ENCOUNTER — Encounter: Payer: Self-pay | Admitting: Family Medicine

## 2024-06-23 VITALS — BP 130/72 | HR 72 | Ht 59.0 in | Wt 149.4 lb

## 2024-06-23 DIAGNOSIS — M5442 Lumbago with sciatica, left side: Secondary | ICD-10-CM

## 2024-06-23 DIAGNOSIS — F5101 Primary insomnia: Secondary | ICD-10-CM

## 2024-06-23 DIAGNOSIS — I272 Pulmonary hypertension, unspecified: Secondary | ICD-10-CM

## 2024-06-23 DIAGNOSIS — I442 Atrioventricular block, complete: Secondary | ICD-10-CM

## 2024-06-23 DIAGNOSIS — I129 Hypertensive chronic kidney disease with stage 1 through stage 4 chronic kidney disease, or unspecified chronic kidney disease: Secondary | ICD-10-CM | POA: Diagnosis not present

## 2024-06-23 DIAGNOSIS — I483 Typical atrial flutter: Secondary | ICD-10-CM

## 2024-06-23 DIAGNOSIS — N184 Chronic kidney disease, stage 4 (severe): Secondary | ICD-10-CM

## 2024-06-23 DIAGNOSIS — G8929 Other chronic pain: Secondary | ICD-10-CM

## 2024-06-23 DIAGNOSIS — E785 Hyperlipidemia, unspecified: Secondary | ICD-10-CM

## 2024-06-23 DIAGNOSIS — E1169 Type 2 diabetes mellitus with other specified complication: Secondary | ICD-10-CM

## 2024-06-23 DIAGNOSIS — Z7985 Long-term (current) use of injectable non-insulin antidiabetic drugs: Secondary | ICD-10-CM

## 2024-06-23 DIAGNOSIS — K219 Gastro-esophageal reflux disease without esophagitis: Secondary | ICD-10-CM

## 2024-06-23 DIAGNOSIS — Z23 Encounter for immunization: Secondary | ICD-10-CM | POA: Diagnosis not present

## 2024-06-23 DIAGNOSIS — E039 Hypothyroidism, unspecified: Secondary | ICD-10-CM

## 2024-06-23 NOTE — Progress Notes (Signed)
 Subjective:    Patient ID: Renee Boyd, female    DOB: 24-Sep-1950, 73 y.o.   MRN: [id number]  Renee Boyd is a 73 y.o. female presenting on 06/23/2024 for Annual Exam   HPI  Discussed the use of AI scribe software for clinical note transcription with the patient, who gave verbal consent to proceed.  History of Present Illness   Renee Boyd is a 73 year old female who presents for an annual physical exam and follow-up on parathyroid  concerns.  Parathyroid  function concerns Followed by Surgery specialist and Nephrology - Concerned about parathyroid  levels, last recorded at 55, within normal range - Experienced anxiety regarding parathyroid  issue, relieved after recent reassurance  Recent fall and leg injury - Sustained a fall over dishwasher door approximately one month ago, resulting in leg injury - Diabetes may complicate wound healing however she has entirely healed now just has mild scar tissue appearance - Used cortisone cream to manage itching during healing process  Peripheral neuropathy symptoms - Occasional numbness in hands and feet - Feet and toes sometimes go numb or 'to sleep,' does not keep her up at night - Numbness in hands can wake her at night - Attributes symptoms to nerve pinching and notes possible impact on pacemaker  Chronic Right Shoulder and Neck Pain Osteoarthritis, multiple joints Back Pain / Hip Pain / Shoulder pain Chronic problem. Episodic painful flares of joint pain, worse with inc activity and storms and weather changes Previously managed on Tramadol  PRN for pain and this was renally adjusted to max of 50mg  BID - still has if needs, may request re order in future - she reports it has been ineffective now for shoulder pain  Cannot take NSAIDs due to CKD. On Gabapentin  300mg  daily doing well. Using baclofen  5mg  only max dose nightly  Type 2 Diabetes Last A1c 6.8 Followed by [facility]ology currently due to advancing renal disease. On  Trulicity  3mg  weekly On Jardiance 10mg  daily Off Metformin  On Jardiance 10mg  - per Nephrology Off Metformin  due to CKD Off Rybelsus  due to nausea   CKD-IV with hyperkalemia and subnephrotic proteinuria HTN Secondary Anemia of chronic kidney disease  Secondary to DM HTN Followed by Dr Douglas Solo on medication management   Persistent Atrial Fibrillation, s/p Cardioversion On Anticoagulation with Eliquis  Complete Heart Block Precordial Chest Pain Followed by Dr Elspeth Sage She had Cardioversion in March 2022, unsuccessful.   Chronic Insomnia Primary problem difficulty sleeping. She takes Temazepam  30mg  nightly PRN. Will need refill in future She received notification that Temazepam  is no longer on formulary. Insurance suggests alternative Lorazepam , Clonazepam, Diazepam .   Health Maintenance: Prevnar-20 vaccine today, pneumonia vaccine.     04/02/2024    3:40 PM 04/02/2024    3:36 PM 12/16/2023    9:35 AM  Depression screen PHQ 2/9  Decreased Interest 0 0 3  Down, Depressed, Hopeless 1 0 0  PHQ - 2 Score 1 0 3  Altered sleeping 0 0 0  Tired, decreased energy 0 0 0  Change in appetite 0 0 0  Feeling bad or failure about yourself  0 0 0  Trouble concentrating 0 0 0  Moving slowly or fidgety/restless 0 0 0  Suicidal thoughts 0 0 0  PHQ-9 Score 1  0  3   Difficult doing work/chores Not difficult at all Not difficult at all Not difficult at all     Data saved with a previous flowsheet row definition  12/16/2023    9:36 AM 07/21/2023   11:03 AM 06/16/2023    9:13 AM 02/11/2023    8:44 AM  GAD 7 : Generalized Anxiety Score  Nervous, Anxious, on Edge 0 0 0 0  Control/stop worrying 0 0 0 0  Worry too much - different things 0 0 0 0  Trouble relaxing 0 0 0 0  Restless 0 0 0 0  Easily annoyed or irritable 0 0 0 0  Afraid - awful might happen 0 0 0 0  Total GAD 7 Score 0 0 0 0  Anxiety Difficulty Not difficult at all   Not difficult at all     Past  Medical History:  Diagnosis Date   Complete heart block (HCC)    a. s/p MDT PPM 2006 with device generator replacement 2014; b. followed by Dr. Fernande   Diabetes [facility])    GERD (gastroesophageal reflux disease)    Hypertension    Hypothyroid    Pacemaker    Persistent atrial fibrillation (HCC)    a. on Eliquis ; b. CHADS2VASc 6 (HTN. age x 1, DM, TIA x 2, female); c. s/p DCCV 05/28/18   Pulmonary hypertension (HCC)    a. echo 2017: EF of 60-65%, no RWMA, mildly dilated left atrium, RVSF normal, PASP 64 mmHg   TIA (transient ischemic attack)    Past Surgical History:  Procedure Laterality Date   ABDOMINAL HYSTERECTOMY     CARDIOVERSION N/A 05/28/2018   Procedure: CARDIOVERSION;  Surgeon: Perla Evalene PARAS, MD;  Location: [facility] ORS;  Service: Cardiovascular;  Laterality: N/A;   CARDIOVERSION N/A 03/01/2020   Procedure: CARDIOVERSION;  Surgeon: Darliss Rogue, MD;  Location: [facility] ORS;  Service: Cardiovascular;  Laterality: N/A;   CARDIOVERSION N/A 10/25/2020   Procedure: CARDIOVERSION;  Surgeon: Darliss Rogue, MD;  Location: [facility] ORS;  Service: Cardiovascular;  Laterality: N/A;   CHOLECYSTECTOMY     PPM GENERATOR CHANGEOUT N/A 04/30/2022   Procedure: PPM GENERATOR CHANGEOUT;  Surgeon: Fernande Elspeth BROCKS, MD;  Location: [facility] INVASIVE CV LAB;  Service: Cardiovascular;  Laterality: N/A;   TONSILLECTOMY     TUBAL LIGATION     Social History   Socioeconomic History   Marital status: Divorced    Spouse name: Not on file   Number of children: 3   Years of education: Not on file   Highest education level: High school graduate  Occupational History   Occupation: retired  Tobacco Use   Smoking status: Former    Current packs/day: 0.00    Average packs/day: 2.0 packs/day for 40.0 years (80.0 ttl pk-yrs)    Types: Cigarettes    Start date: 06/27/1965    Quit date: 06/27/2005    Years since quitting: 19.0   Smokeless tobacco: Former   Tobacco comments:    quit Jun 27 2005  Vaping  Use   Vaping status: Never Used  Substance and Sexual Activity   Alcohol use: No   Drug use: No   Sexual activity: Not Currently  Other Topics Concern   Not on file  Social History Narrative   Lives by self.   Social Drivers of Corporate Investment Banker Strain: Low Risk  (04/02/2024)   Overall Financial Resource Strain (CARDIA)    Difficulty of Paying Living Expenses: Not hard at all  Food Insecurity: No Food Insecurity (04/02/2024)   Hunger Vital Sign    Worried About Running Out of Food in the Last Year: Never true    Ran Out of Food in  the Last Year: Never true  Transportation Needs: No Transportation Needs (04/02/2024)   PRAPARE - Administrator, Civil Service (Medical): No    Lack of Transportation (Non-Medical): No  Physical Activity: Inactive (04/02/2024)   Exercise Vital Sign    Days of Exercise per Week: 0 days    Minutes of Exercise per Session: 0 min  Stress: No Stress Concern Present (04/02/2024)   [employer] of Occupational Health - Occupational Stress Questionnaire    Feeling of Stress: Only a little  Social Connections: Moderately Integrated (04/02/2024)   Social Connection and Isolation Panel    Frequency of Communication with Friends and Family: More than three times a week    Frequency of Social Gatherings with Friends and Family: Twice a week    Attends Religious Services: More than 4 times per year    Active Member of [employer] or Organizations: Yes    Attends Engineer, Structural: More than 4 times per year    Marital Status: Divorced  Intimate Partner Violence: Not At Risk (04/02/2024)   Humiliation, Afraid, Rape, and Kick questionnaire    Fear of Current or Ex-Partner: No    Emotionally Abused: No    Physically Abused: No    Sexually Abused: No   Family History  Problem Relation Age of Onset   Hypertension Other    Heart disease Mother        MI   Alcohol abuse Father    Autism Son    Heart disease Maternal Grandmother     Cancer Son        colon   Miscarriages / Stillbirths Maternal Aunt    Current Outpatient Medications on File Prior to Visit  Medication Sig   acetaminophen  (TYLENOL ) 500 MG tablet Take 1,000 mg by mouth every 8 (eight) hours as needed.   albuterol  (PROVENTIL  HFA) 108 (90 Base) MCG/ACT inhaler Inhale 2 puffs into the lungs every 4 (four) hours as needed for wheezing or shortness of breath.   allopurinol  (ZYLOPRIM ) 100 MG tablet TAKE 1 TABLET DAILY   amLODipine  (NORVASC ) 10 MG tablet TAKE 1 TABLET DAILY   apixaban  (ELIQUIS ) 5 MG TABS tablet TAKE 1 TABLET TWICE A DAY   ascorbic acid  (VITAMIN C) 1000 MG tablet Take 1,000 mg by mouth daily.   atorvastatin  (LIPITOR) 20 MG tablet TAKE 1 TABLET DAILY   Baclofen  5 MG TABS TAKE 1 TABLET AT BEDTIME   COMBIVENT  RESPIMAT 20-100 MCG/ACT AERS respimat INHALE 1 PUFF EVERY 6 HOURS AS NEEDED FOR WHEEZING/SOB   CONTOUR NEXT TEST test strip Use to check blood sugar up to twice a day.   desonide  (DESOWEN ) 0.05 % lotion Apply topically 2 (two) times daily.   Dulaglutide  (TRULICITY  ) Inject into the skin.   furosemide  (LASIX ) 20 MG tablet TAKE 3 TABLETS ONCE DAILY   JARDIANCE 10 MG TABS tablet Take 10 mg by mouth daily.   levothyroxine  (SYNTHROID ) 25 MCG tablet Take 1 tablet (25 mcg total) by mouth daily before breakfast.   loperamide  (IMODIUM  A-D) 2 MG tablet Take 2 tablets (4 mg total) by mouth 4 (four) times daily as needed for diarrhea or loose stools.   metoprolol  succinate (TOPROL -XL) 50 MG 24 hr tablet Take 1 tablet (50 mg total) by mouth daily. Take with or immediately following a meal.   Multiple Vitamin (MULTIVITAMIN) capsule Take 1 capsule by mouth daily.   omeprazole  (PRILOSEC) 20 MG capsule TAKE 1 CAPSULE TWICE A DAY BEFORE MEALS   spironolactone (  ALDACTONE) 25 MG tablet Take 25 mg by mouth daily.   telmisartan (MICARDIS) 20 MG tablet Take 20 mg by mouth daily.   temazepam  (RESTORIL ) 30 MG capsule TAKE 1 CAPSULE (30 MG TOTAL) BY MOUTH AT BEDTIME.    traMADol  (ULTRAM ) 50 MG tablet TAKE 1 TABLET BY MOUTH THREE TIMES A DAY   gabapentin  (NEURONTIN ) 300 MG capsule Take 1 capsule (300 mg total) by mouth daily. TAKES ONCE PER DAY   No current facility-administered medications on file prior to visit.    Review of Systems  Constitutional:  Negative for activity change, appetite change, chills, diaphoresis, fatigue and fever.  HENT:  Negative for congestion and hearing loss.   Eyes:  Negative for visual disturbance.  Respiratory:  Negative for cough, chest tightness, shortness of breath and wheezing.   Cardiovascular:  Negative for chest pain, palpitations and leg swelling.  Gastrointestinal:  Negative for abdominal pain, constipation, diarrhea, nausea and vomiting.  Genitourinary:  Negative for dysuria, frequency and hematuria.  Musculoskeletal:  Negative for arthralgias and neck pain.  Skin:  Negative for rash.  Neurological:  Negative for dizziness, weakness, light-headedness, numbness and headaches.  Hematological:  Negative for adenopathy.  Psychiatric/Behavioral:  Positive for sleep disturbance. Negative for behavioral problems and dysphoric mood.    Per HPI unless specifically indicated above     Objective:    BP 130/72 (BP Location: Left Arm, Patient Position: Sitting, Cuff Size: Normal)   Pulse 72   Ht 4' 11 (1.499 m)   Wt 149 lb 6 oz (67.8 kg)   BMI 30.17 kg/m   Wt Readings from Last 3 Encounters:  06/23/24 149 lb 6 oz (67.8 kg)  03/16/24 149 lb 3.2 oz (67.7 kg)  12/16/23 149 lb 6 oz (67.8 kg)    Physical Exam Vitals and nursing note reviewed.  Constitutional:      General: She is not in acute distress.    Appearance: She is well-developed. She is not diaphoretic.     Comments: Well-appearing, comfortable, cooperative  HENT:     Head: Normocephalic and atraumatic.  Eyes:     General:        Right eye: No discharge.        Left eye: No discharge.     Conjunctiva/sclera: Conjunctivae normal.  Neck:      Thyroid : No thyromegaly.  Cardiovascular:     Rate and Rhythm: Normal rate and regular rhythm.     Heart sounds: Normal heart sounds. No murmur heard. Pulmonary:     Effort: Pulmonary effort is normal. No respiratory distress.     Breath sounds: Normal breath sounds. No wheezing or rales.  Musculoskeletal:        General: Normal range of motion.     Cervical back: Normal range of motion and neck supple.  Lymphadenopathy:     Cervical: No cervical adenopathy.  Skin:    General: Skin is warm and dry.     Findings: No erythema or rash.  Neurological:     Mental Status: She is alert and oriented to person, place, and time.  Psychiatric:        Behavior: Behavior normal.     Comments: Well groomed, good eye contact, normal speech and thoughts     Diabetic Foot Exam - Simple   Simple Foot Form Diabetic Foot exam was performed with the following findings: Yes 06/23/2024  9:22 AM  Visual Inspection See comments: Yes Sensation Testing Intact to touch and monofilament testing bilaterally: Yes Pulse  Check Posterior Tibialis and Dorsalis pulse intact bilaterally: Yes Comments Bilateral bunion. No callus formation. Intact to monofilament sensation.      Last labs 03/23/24   Comprehensive Metabolic Panel (CMP) Specimen: Blood Component Ref Range & Units 3 mo ago Comments  Glucose 70 - 110 mg/dL 862 High    Sodium 863 - 145 mmol/L 136   Potassium 3.6 - 5.1 mmol/L 4.9   Chloride 97 - 109 mmol/L 99   Carbon Dioxide (CO2) 22.0 - 32.0 mmol/L 29.3   Urea Nitrogen (BUN) 7 - 25 mg/dL 40 High    Creatinine 0.6 - 1.1 mg/dL 2.3 High    Glomerular Filtration Rate (eGFR) >60 mL/min/1.73sq m 22 Low  CKD-EPI (2021) does not include patient's race in the calculation of eGFR.  Monitoring changes of plasma creatinine and eGFR over time is useful for monitoring kidney function.  Interpretive Ranges for eGFR (CKD-EPI 2021):  eGFR:       >60 mL/min/1.73 sq. m - Normal eGFR:       30-59  mL/min/1.73 sq. m - Moderately Decreased eGFR:       15-29 mL/min/1.73 sq. m  - Severely Decreased eGFR:       < 15 mL/min/1.73 sq. m  - Kidney Failure   Note: These eGFR calculations do not apply in acute situations when eGFR is changing rapidly or patients on dialysis.  Calcium  8.7 - 10.3 mg/dL 89.3 High    AST 8 - 39 U/L 20   ALT 5 - 38 U/L 21   Alk Phos (alkaline Phosphatase) 34 - 104 U/L 75   Albumin 3.5 - 4.8 g/dL 4.9 High    Bilirubin, Total 0.3 - 1.2 mg/dL 0.4   Protein, Total 6.1 - 7.9 g/dL 7.1   A/G Ratio 1.0 - 5.0 gm/dL 2.2   Resulting Agency [facility]   Specimen Collected: 03/23/24 09:28   Performed by: [facility] Last Resulted: 03/23/24 11:08  Received From: [facility]  Result Received: 05/03/24 14:14    Protein, Total, Random Urine w/Creatinine (Protein/Creat Ratio) Specimen: Urine specimen (specimen) - Urine specimen obtained by clean catch procedure (specimen) Component Ref Range & Units 6 mo ago  Creatinine, Ur 20 - 275 mg/dL 44  Urine Protein/Creatinine Ratio 24 - 184 mg/g creat 159  Protein/Creatinine Ratio, Urine 0.024 - 0.184 mg/mg creat 0.159  Protein Urine Random 5 - 24 mg/dL 7  Resulting Agency [facility]    Results for orders placed or performed in visit on 06/23/24  Microalbumin, urine   Collection Time: 09/17/23 12:00 AM  Result Value Ref Range   Microalb, Ur 7   Microalbumin / creatinine urine ratio   Collection Time: 09/17/23 12:00 AM  Result Value Ref Range   Microalb Creat Ratio 159   Protein / creatinine ratio, urine   Collection Time: 09/17/23 12:00 AM  Result Value Ref Range   Creatinine, Urine 44    Albumin, U 7   Hemoglobin A1c   Collection Time: 03/23/24 12:00 AM  Result Value Ref Range   Hemoglobin A1C 6.8   Basic metabolic panel with GFR   Collection Time: 03/23/24 12:00 AM  Result Value Ref Range   Creatinine 2.3 (A) 0.5 - 1.1  Comprehensive  metabolic panel with GFR   Collection Time: 03/23/24 12:00 AM  Result Value Ref Range   eGFR 22       Assessment & Plan:   Problem List Items Addressed This Visit  Atrial flutter (HCC)   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)   Chronic left-sided low back pain with left-sided sciatica   Relevant Medications   gabapentin  (NEURONTIN ) 300 MG capsule   CKD (chronic kidney disease), stage IV (HCC)   Complete heart block (HCC)   Hypothyroidism   Primary insomnia   Pulmonary hypertension, unspecified (HCC)   Type 2 diabetes mellitus with hyperlipidemia (HCC) - Primary   Other Visit Diagnoses       Gastroesophageal reflux disease without esophagitis         Need for Streptococcus pneumoniae vaccination       Relevant Orders   Pneumococcal conjugate vaccine 20-valent (Completed)     Long term current use of oral hypoglycemic drug            Updated Health Maintenance information Reviewed recent lab results from other specialists w/ patient Encouraged improvement to lifestyle with diet and exercise Goal of weight loss  Adult Wellness Visit Annual wellness visit completed. Colon cancer screening due, but she is hesitant. - Administered pneumonia vaccine. Prevnar 20 today - Discuss colonoscopy with GI specialist if interested. Last 2015, due now 2025, she declines but will consider it.  Type 2 Diabetes Mellitus Followed by Endocrinology Complications with CKD IV Blood sugar levels well-controlled. Recent A1c was 6.8%. - Continue current diabetes medications: Jardiance and Trulicity .  Hypertension / CKD IV Recent lab results reviewed. Followed by Nephrology On medication management  Atrial Flutter Complete Heart Block Followed by Cardiology Managed with pacemaker.  Insomnia Chronic problem Managed with temazepam . She reports issues with current insurance formulary and needs to switch meds - Consider switching to lorazepam  if temazepam   is ineffective. Insurance suggests alternative Lorazepam , Clonazepam, Diazepam . She will call when ready to switch and request new med.   Orders Placed This Encounter  Procedures   Pneumococcal conjugate vaccine 20-valent   Hemoglobin A1c    This external order was created through the Results Console.   Basic metabolic panel with GFR    This external order was created through the Results Console.   Comprehensive metabolic panel with GFR    This external order was created through the Results Console.   Microalbumin, urine    This external order was created through the Results Console.   Microalbumin / creatinine urine ratio    This external order was created through the Results Console.   Protein / creatinine ratio, urine    This external order was created through the Results Console.    No orders of the defined types were placed in this encounter.    Follow up plan: Return in about 6 months (around 12/21/2024) for 6 month DM A1c, CKD upates.  Marsa Officer, DO [facility] 06/23/2024, 9:11 AM

## 2024-06-23 NOTE — Patient Instructions (Addendum)
 Thank you for coming to the office today.  Prevnar-20 vaccine today pneumonia shot, good for 5 years  Keep up with specialists as you are.  Call us  when ready to request medication change from Temazepam  to one of the others on the formulary list - Lorazepam , Clonazepam, Diazepam .  Recommend Colonoscopy, call if interested  Please schedule a Follow-up Appointment to: Return in about 6 months (around 12/21/2024) for 6 month DM A1c, CKD upates.  If you have any other questions or concerns, please feel free to call the office or send a message through MyChart. You may also schedule an earlier appointment if necessary.  Additionally, you may be receiving a survey about your experience at our office within a few days to 1 week by e-mail or mail. We value your feedback.  Marsa Officer, DO [facility], [place]

## 2024-06-24 DIAGNOSIS — E1122 Type 2 diabetes mellitus with diabetic chronic kidney disease: Secondary | ICD-10-CM | POA: Diagnosis not present

## 2024-06-24 DIAGNOSIS — N184 Chronic kidney disease, stage 4 (severe): Secondary | ICD-10-CM | POA: Diagnosis not present

## 2024-06-25 ENCOUNTER — Other Ambulatory Visit: Payer: Self-pay | Admitting: Family Medicine

## 2024-06-25 DIAGNOSIS — M150 Primary generalized (osteo)arthritis: Secondary | ICD-10-CM

## 2024-06-25 DIAGNOSIS — R0609 Other forms of dyspnea: Secondary | ICD-10-CM

## 2024-06-25 DIAGNOSIS — F5101 Primary insomnia: Secondary | ICD-10-CM

## 2024-06-25 DIAGNOSIS — G8929 Other chronic pain: Secondary | ICD-10-CM

## 2024-06-25 NOTE — Telephone Encounter (Unsigned)
 Copied from CRM [id number]. Topic: Clinical - Medication Refill >> Jun 25, 2024 10:01 AM Amy B wrote: Medication:   COMBIVENT  RESPIMAT 20-100 MCG/ACT AERS respimat temazepam  (RESTORIL ) 30 MG capsule traMADol  (ULTRAM ) 50 MG tablet  Has the patient contacted their pharmacy? Yes (Agent: If no, request that the patient contact the pharmacy for the refill. If patient does not wish to contact the pharmacy document the reason why and proceed with request.) (Agent: If yes, when and what did the pharmacy advise?)  This is the patient's preferred pharmacy:  [id number] - [place], [place] - [address] [place] [address] Phone: [phone] Fax: [phone]  Is this the correct pharmacy for this prescription? Yes If no, delete pharmacy and type the correct one.   Has the prescription been filled recently? No  Is the patient out of the medication? No  Has the patient been seen for an appointment in the last year OR does the patient have an upcoming appointment? Yes  Can we respond through MyChart? No  Agent: Please be advised that Rx refills may take up to 3 business days. We ask that you follow-up with your pharmacy.

## 2024-06-25 NOTE — Telephone Encounter (Signed)
 Requested Prescriptions  Pending Prescriptions Disp Refills   allopurinol  (ZYLOPRIM ) 100 MG tablet [Pharmacy Med Name: ALLOPURINOL  TABS 100MG ] 90 tablet 0    Sig: TAKE 1 TABLET DAILY     Endocrinology:  Gout Agents - allopurinol  Failed - 06/25/2024 12:35 PM      Failed - Uric Acid in normal range and within 360 days    Uric Acid, Serum  Date Value Ref Range Status  03/23/2018 5.8 2.5 - 7.0 mg/dL Final    Comment:    Therapeutic target for gout patients: <6.0 mg/dL .    Uric Acid  Date Value Ref Range Status  11/11/2016 9.7 (H) 2.5 - 7.1 mg/dL Final    Comment:               Therapeutic target for gout patients: <6.0         Failed - Cr in normal range and within 360 days    Creatinine  Date Value Ref Range Status  03/23/2024 2.3 (A) 0.5 - 1.1 Final   Creat  Date Value Ref Range Status  06/19/2022 1.59 (H) 0.60 - 1.00 mg/dL Final   Creatinine, Ser  Date Value Ref Range Status  07/12/2023 1.49 (H) 0.44 - 1.00 mg/dL Final   Creatinine, Urine  Date Value Ref Range Status  09/17/2023 44  Final         Failed - CBC within normal limits and completed in the last 12 months    WBC  Date Value Ref Range Status  07/12/2023 2.5 (L) 4.0 - 10.5 K/uL Final   RBC  Date Value Ref Range Status  07/12/2023 4.81 3.87 - 5.11 MIL/uL Final   Hemoglobin  Date Value Ref Range Status  07/12/2023 14.2 12.0 - 15.0 g/dL Final  89/76/7980 88.6 11.1 - 15.9 g/dL Final   HCT  Date Value Ref Range Status  07/12/2023 42.9 36.0 - 46.0 % Final   Hematocrit  Date Value Ref Range Status  05/27/2018 35.3 34.0 - 46.6 % Final   MCHC  Date Value Ref Range Status  07/12/2023 33.1 30.0 - 36.0 g/dL Final   [facility]  Date Value Ref Range Status  07/12/2023 29.5 26.0 - 34.0 pg Final   MCV  Date Value Ref Range Status  07/12/2023 89.2 80.0 - 100.0 fL Final  05/27/2018 88 79 - 97 fL Final  07/13/2013 89 80 - 100 fL Final   No results found for: PLTCOUNTKUC, LABPLAT, POCPLA RDW  Date  Value Ref Range Status  07/12/2023 15.9 (H) 11.5 - 15.5 % Final  05/27/2018 15.0 12.3 - 15.4 % Final  07/13/2013 14.6 (H) 11.5 - 14.5 % Final         Passed - Valid encounter within last 12 months    Recent Outpatient Visits           2 days ago Type 2 diabetes mellitus with hyperlipidemia [facility])   [place] [facility] [place] [name], DO   6 months ago Type 2 diabetes mellitus with hyperlipidemia [facility])   [place] [facility] [place], [name], DO

## 2024-06-27 MED ORDER — COMBIVENT RESPIMAT 20-100 MCG/ACT IN AERS: INHALATION_SPRAY | RESPIRATORY_TRACT | 2 refills | Status: AC

## 2024-06-27 NOTE — Telephone Encounter (Signed)
 Requested medication (s) are due for refill today - provider review   Requested medication (s) are on the active medication list -yes  Future visit scheduled -yes  Last refill: temazepam - 04/27/24 #30 2RF- non delegated Rx, too soon                 Tramadol - 01/22/24 #90 2RF- non delegated Rx   Notes to clinic: see above  Requested Prescriptions  Pending Prescriptions Disp Refills   temazepam  (RESTORIL ) 30 MG capsule 30 capsule 2    Sig: Take 1 capsule (30 mg total) by mouth at bedtime.     Not Delegated - Psychiatry: Anxiolytics/Hypnotics 2 Failed - 06/27/2024  8:38 AM      Failed - This refill cannot be delegated      Failed - Urine Drug Screen completed in last 360 days      Passed - Patient is not pregnant      Passed - Valid encounter within last 6 months    Recent Outpatient Visits           4 days ago Type 2 diabetes mellitus with hyperlipidemia [facility])   [place] [facility] [place], [name], DO   6 months ago Type 2 diabetes mellitus with hyperlipidemia [facility])   [facility], [name], DO               traMADol  (ULTRAM ) 50 MG tablet 90 tablet 2    Sig: Take 1 tablet (50 mg total) by mouth 3 (three) times daily.     Not Delegated - Analgesics:  Opioid Agonists Failed - 06/27/2024  8:38 AM      Failed - This refill cannot be delegated      Failed - Urine Drug Screen completed in last 360 days      Passed - Valid encounter within last 3 months    Recent Outpatient Visits           4 days ago Type 2 diabetes mellitus with hyperlipidemia [facility])   [facility] [place], [name], DO   6 months ago Type 2 diabetes mellitus with hyperlipidemia [facility])   [place] [facility] [place], [name], DO              Signed Prescriptions Disp Refills   COMBIVENT  RESPIMAT 20-100 MCG/ACT AERS respimat 4 g 2    Sig: INHALE 1 PUFF EVERY 6 HOURS  AS NEEDED FOR WHEEZING/SOB     Pulmonology:  Combination Products - albuterol  / ipratropium Passed - 06/27/2024  8:38 AM      Passed - Last BP in normal range    BP Readings from Last 1 Encounters:  06/23/24 130/72         Passed - Last Heart Rate in normal range    Pulse Readings from Last 1 Encounters:  06/23/24 72         Passed - Valid encounter within last 12 months    Recent Outpatient Visits           4 days ago Type 2 diabetes mellitus with hyperlipidemia [facility])   [place] [facility] [place], [name], DO   6 months ago Type 2 diabetes mellitus with hyperlipidemia [facility])    [name], DO                 Requested Prescriptions  Pending Prescriptions  Disp Refills   temazepam  (RESTORIL ) 30 MG capsule 30 capsule 2    Sig: Take 1 capsule (30 mg total) by mouth at bedtime.     Not Delegated - Psychiatry: Anxiolytics/Hypnotics 2 Failed - 06/27/2024  8:38 AM      Failed - This refill cannot be delegated      Failed - Urine Drug Screen completed in last 360 days      Passed - Patient is not pregnant      Passed - Valid encounter within last 6 months    Recent Outpatient Visits           4 days ago Type 2 diabetes mellitus with hyperlipidemia [facility])   [facility] [place], [name], DO   6 months ago Type 2 diabetes mellitus with hyperlipidemia [facility])   [place] [facility], [name], DO               traMADol  (ULTRAM ) 50 MG tablet 90 tablet 2    Sig: Take 1 tablet (50 mg total) by mouth 3 (three) times daily.     Not Delegated - Analgesics:  Opioid Agonists Failed - 06/27/2024  8:38 AM      Failed - This refill cannot be delegated      Failed - Urine Drug Screen completed in last 360 days      Passed - Valid encounter within last 3 months    Recent Outpatient Visits           4 days ago Type 2 diabetes  mellitus with hyperlipidemia [facility])   [place] [facility] [place], [name], DO   6 months ago Type 2 diabetes mellitus with hyperlipidemia [facility])   [place] [facility] [place], [name], DO              Signed Prescriptions Disp Refills   COMBIVENT  RESPIMAT 20-100 MCG/ACT AERS respimat 4 g 2    Sig: INHALE 1 PUFF EVERY 6 HOURS AS NEEDED FOR WHEEZING/SOB     Pulmonology:  Combination Products - albuterol  / ipratropium Passed - 06/27/2024  8:38 AM      Passed - Last BP in normal range    BP Readings from Last 1 Encounters:  06/23/24 130/72         Passed - Last Heart Rate in normal range    Pulse Readings from Last 1 Encounters:  06/23/24 72         Passed - Valid encounter within last 12 months    Recent Outpatient Visits           4 days ago Type 2 diabetes mellitus with hyperlipidemia [facility])   [facility] [place], [name], DO   6 months ago Type 2 diabetes mellitus with hyperlipidemia [facility])   [place] [facility] [place], [name], [place]

## 2024-06-27 NOTE — Telephone Encounter (Signed)
 Requested Prescriptions  Pending Prescriptions Disp Refills   COMBIVENT  RESPIMAT 20-100 MCG/ACT AERS respimat 4 g 2    Sig: INHALE 1 PUFF EVERY 6 HOURS AS NEEDED FOR WHEEZING/SOB     Pulmonology:  Combination Products - albuterol  / ipratropium Passed - 06/27/2024  8:37 AM      Passed - Last BP in normal range    BP Readings from Last 1 Encounters:  06/23/24 130/72         Passed - Last Heart Rate in normal range    Pulse Readings from Last 1 Encounters:  06/23/24 72         Passed - Valid encounter within last 12 months    Recent Outpatient Visits           4 days ago Type 2 diabetes mellitus with hyperlipidemia [facility])   [facility] [place], [name], DO   6 months ago Type 2 diabetes mellitus with hyperlipidemia [facility])   [place] [facility] [place], [name], DO               temazepam  (RESTORIL ) 30 MG capsule 30 capsule 2    Sig: Take 1 capsule (30 mg total) by mouth at bedtime.     Not Delegated - Psychiatry: Anxiolytics/Hypnotics 2 Failed - 06/27/2024  8:37 AM      Failed - This refill cannot be delegated      Failed - Urine Drug Screen completed in last 360 days      Passed - Patient is not pregnant      Passed - Valid encounter within last 6 months    Recent Outpatient Visits           4 days ago Type 2 diabetes mellitus with hyperlipidemia [facility])   [facility] [place], [name], DO   6 months ago Type 2 diabetes mellitus with hyperlipidemia [facility])   [place] [facility] [place], [name], DO               traMADol  (ULTRAM ) 50 MG tablet 90 tablet 2    Sig: Take 1 tablet (50 mg total) by mouth 3 (three) times daily.     Not Delegated - Analgesics:  Opioid Agonists Failed - 06/27/2024  8:37 AM      Failed - This refill cannot be delegated      Failed - Urine Drug Screen completed in last 360 days      Passed - Valid encounter  within last 3 months    Recent Outpatient Visits           4 days ago Type 2 diabetes mellitus with hyperlipidemia [facility])   [place] [facility] [place], [name], DO   6 months ago Type 2 diabetes mellitus with hyperlipidemia [facility])   [place] [facility] [place], [name], [place]

## 2024-06-28 ENCOUNTER — Telehealth: Payer: Self-pay

## 2024-06-28 ENCOUNTER — Other Ambulatory Visit: Payer: Self-pay | Admitting: Family Medicine

## 2024-06-28 DIAGNOSIS — M150 Primary generalized (osteo)arthritis: Secondary | ICD-10-CM

## 2024-06-28 DIAGNOSIS — G8929 Other chronic pain: Secondary | ICD-10-CM

## 2024-06-28 MED ORDER — TEMAZEPAM 30 MG PO CAPS: 30.0000 mg | ORAL_CAPSULE | Freq: Every day | ORAL | 2 refills | Status: DC

## 2024-06-28 MED ORDER — TRAMADOL HCL 50 MG PO TABS: 50.0000 mg | ORAL_TABLET | Freq: Three times a day (TID) | ORAL | 2 refills | Status: AC

## 2024-06-28 NOTE — Telephone Encounter (Signed)
 Copied from CRM [id number]. Topic: Clinical - Prescription Issue >> Jun 28, 2024 11:51 AM Olam RAMAN wrote: Reason for CRM: Pt was calling about traMADol  (ULTRAM ) 50 MG tablet  Stated she has not rcvd medication

## 2024-06-29 DIAGNOSIS — N184 Chronic kidney disease, stage 4 (severe): Secondary | ICD-10-CM | POA: Diagnosis not present

## 2024-06-29 DIAGNOSIS — M8580 Other specified disorders of bone density and structure, unspecified site: Secondary | ICD-10-CM | POA: Diagnosis not present

## 2024-06-29 DIAGNOSIS — E875 Hyperkalemia: Secondary | ICD-10-CM | POA: Diagnosis not present

## 2024-06-29 DIAGNOSIS — E1159 Type 2 diabetes mellitus with other circulatory complications: Secondary | ICD-10-CM | POA: Diagnosis not present

## 2024-06-29 DIAGNOSIS — Z780 Asymptomatic menopausal state: Secondary | ICD-10-CM | POA: Diagnosis not present

## 2024-06-29 DIAGNOSIS — E213 Hyperparathyroidism, unspecified: Secondary | ICD-10-CM | POA: Diagnosis not present

## 2024-06-29 DIAGNOSIS — I10 Essential (primary) hypertension: Secondary | ICD-10-CM | POA: Diagnosis not present

## 2024-06-29 DIAGNOSIS — E1122 Type 2 diabetes mellitus with diabetic chronic kidney disease: Secondary | ICD-10-CM | POA: Diagnosis not present

## 2024-06-29 DIAGNOSIS — E039 Hypothyroidism, unspecified: Secondary | ICD-10-CM | POA: Diagnosis not present

## 2024-06-29 NOTE — Telephone Encounter (Signed)
 Requested medications are due for refill today.  no  Requested medications are on the active medications list.  yes  Last refill. 06/28/2024  Future visit scheduled.   yes  Notes to clinic.  Refusal not delegated.    Requested Prescriptions  Pending Prescriptions Disp Refills   traMADol  (ULTRAM ) 50 MG tablet [Pharmacy Med Name: TRAMADOL  HCL 50 MG TABLET] 90 tablet 2    Sig: TAKE 1 TABLET BY MOUTH THREE TIMES A DAY     Not Delegated - Analgesics:  Opioid Agonists Failed - 06/29/2024  1:58 PM      Failed - This refill cannot be delegated      Failed - Urine Drug Screen completed in last 360 days      Passed - Valid encounter within last 3 months    Recent Outpatient Visits           6 days ago Type 2 diabetes mellitus with hyperlipidemia [facility])   [place] [facility] [place], [name], DO   6 months ago Type 2 diabetes mellitus with hyperlipidemia [facility])   [place] [facility] [place], [name], [place]

## 2024-07-05 ENCOUNTER — Other Ambulatory Visit: Payer: Self-pay | Admitting: Cardiology

## 2024-07-07 DIAGNOSIS — N184 Chronic kidney disease, stage 4 (severe): Secondary | ICD-10-CM | POA: Diagnosis not present

## 2024-07-07 DIAGNOSIS — I129 Hypertensive chronic kidney disease with stage 1 through stage 4 chronic kidney disease, or unspecified chronic kidney disease: Secondary | ICD-10-CM | POA: Diagnosis not present

## 2024-07-07 DIAGNOSIS — E1122 Type 2 diabetes mellitus with diabetic chronic kidney disease: Secondary | ICD-10-CM | POA: Diagnosis not present

## 2024-07-07 DIAGNOSIS — Z7985 Long-term (current) use of injectable non-insulin antidiabetic drugs: Secondary | ICD-10-CM | POA: Diagnosis not present

## 2024-07-07 DIAGNOSIS — N2581 Secondary hyperparathyroidism of renal origin: Secondary | ICD-10-CM | POA: Diagnosis not present

## 2024-07-07 DIAGNOSIS — R809 Proteinuria, unspecified: Secondary | ICD-10-CM | POA: Diagnosis not present

## 2024-07-07 DIAGNOSIS — E875 Hyperkalemia: Secondary | ICD-10-CM | POA: Diagnosis not present

## 2024-07-09 MED ORDER — FUROSEMIDE 20 MG PO TABS: 60.0000 mg | ORAL_TABLET | Freq: Every day | ORAL | 2 refills | Status: AC

## 2024-07-09 NOTE — Telephone Encounter (Signed)
 Please refuse to clear from [facility]

## 2024-07-13 ENCOUNTER — Other Ambulatory Visit: Payer: Self-pay | Admitting: Family Medicine

## 2024-07-13 DIAGNOSIS — G8929 Other chronic pain: Secondary | ICD-10-CM

## 2024-07-15 NOTE — Telephone Encounter (Signed)
 Not current dosing of this medication  Requested Prescriptions  Pending Prescriptions Disp Refills   gabapentin  (NEURONTIN ) 300 MG capsule [Pharmacy Med Name: GABAPENTIN  CAPS 300MG ] 180 capsule 3    Sig: TAKE 1 CAPSULE TWICE A DAY     Neurology: Anticonvulsants - gabapentin  Failed - 07/15/2024 12:01 PM      Failed - Cr in normal range and within 360 days    Creatinine  Date Value Ref Range Status  03/23/2024 2.3 (A) 0.5 - 1.1 Final   Creat  Date Value Ref Range Status  06/19/2022 1.59 (H) 0.60 - 1.00 mg/dL Final   Creatinine, Ser  Date Value Ref Range Status  07/12/2023 1.49 (H) 0.44 - 1.00 mg/dL Final   Creatinine, Urine  Date Value Ref Range Status  09/17/2023 44  Final         Passed - Completed PHQ-2 or PHQ-9 in the last 360 days      Passed - Valid encounter within last 12 months    Recent Outpatient Visits           3 weeks ago Type 2 diabetes mellitus with hyperlipidemia [facility])   [place] [facility] [place] [name], DO   7 months ago Type 2 diabetes mellitus with hyperlipidemia [facility])   [place] [facility] [place], [name], [place]

## 2024-07-26 ENCOUNTER — Other Ambulatory Visit: Payer: Self-pay | Admitting: Family Medicine

## 2024-07-26 DIAGNOSIS — K219 Gastro-esophageal reflux disease without esophagitis: Secondary | ICD-10-CM

## 2024-07-27 ENCOUNTER — Ambulatory Visit: Payer: Medicare Other

## 2024-07-27 DIAGNOSIS — I4819 Other persistent atrial fibrillation: Secondary | ICD-10-CM

## 2024-07-28 LAB — CUP PACEART REMOTE DEVICE CHECK
Battery Remaining Longevity: 115 mo
Battery Voltage: 3.01 V
Brady Statistic AP VP Percent: 91.97 %
Brady Statistic AP VS Percent: 0.01 %
Brady Statistic AS VP Percent: 7.52 %
Brady Statistic AS VS Percent: 0.5 %
Brady Statistic RA Percent Paced: 91.33 %
Brady Statistic RV Percent Paced: 99.49 %
Date Time Interrogation Session: 20251222192715
Implantable Lead Connection Status: 753985
Implantable Lead Connection Status: 753985
Implantable Lead Implant Date: 20061129
Implantable Lead Implant Date: 20061129
Implantable Lead Location: 753859
Implantable Lead Location: 753860
Implantable Lead Model: 5076
Implantable Lead Model: 5076
Implantable Pulse Generator Implant Date: 20230926
Lead Channel Impedance Value: 399 Ohm
Lead Channel Impedance Value: 437 Ohm
Lead Channel Impedance Value: 494 Ohm
Lead Channel Impedance Value: 532 Ohm
Lead Channel Pacing Threshold Amplitude: 0.375 V
Lead Channel Pacing Threshold Amplitude: 0.75 V
Lead Channel Pacing Threshold Pulse Width: 0.4 ms
Lead Channel Pacing Threshold Pulse Width: 0.4 ms
Lead Channel Sensing Intrinsic Amplitude: 2.875 mV
Lead Channel Sensing Intrinsic Amplitude: 2.875 mV
Lead Channel Sensing Intrinsic Amplitude: 9.25 mV
Lead Channel Sensing Intrinsic Amplitude: 9.25 mV
Lead Channel Setting Pacing Amplitude: 1.5 V
Lead Channel Setting Pacing Amplitude: 2 V
Lead Channel Setting Pacing Pulse Width: 0.4 ms
Lead Channel Setting Sensing Sensitivity: 2 mV
Zone Setting Status: 755011

## 2024-07-28 NOTE — Telephone Encounter (Signed)
 Requested Prescriptions  Pending Prescriptions Disp Refills   omeprazole  (PRILOSEC) 20 MG capsule [Pharmacy Med Name: OMEPRAZOLE  DR CAPS 20MG ] 180 capsule 1    Sig: TAKE 1 CAPSULE TWICE A DAY BEFORE MEALS     Gastroenterology: Proton Pump Inhibitors Passed - 07/28/2024 11:05 AM      Passed - Valid encounter within last 12 months    Recent Outpatient Visits           1 month ago Type 2 diabetes mellitus with hyperlipidemia [facility])   [facility] [place], [name], DO   7 months ago Type 2 diabetes mellitus with hyperlipidemia [facility])   [facility] [place], [name], [place]

## 2024-07-28 NOTE — Progress Notes (Signed)
 Remote PPM Transmission

## 2024-08-08 ENCOUNTER — Ambulatory Visit: Payer: Self-pay | Admitting: Cardiology

## 2024-08-24 ENCOUNTER — Telehealth: Payer: Self-pay

## 2024-08-24 DIAGNOSIS — F5101 Primary insomnia: Secondary | ICD-10-CM

## 2024-08-24 NOTE — Telephone Encounter (Signed)
 Copied from CRM #[id number]. Topic: Clinical - Medication Refill >> Aug 24, 2024  3:19 PM Amber H wrote: Medication: Diazepam , not on med list however, per provider message patient is requesting to change to diazepam    Has the patient contacted their pharmacy? No- New med (Agent: If no, request that the patient contact the pharmacy for the refill. If patient does not wish to contact the pharmacy document the reason why and proceed with request.) (Agent: If yes, when and what did the pharmacy advise?)  This is the patient's preferred pharmacy:    [id number] - [place], [place] - [address] [place] [address] Phone: [phone] Fax: [phone]  Is this the correct pharmacy for this prescription? Yes If no, delete pharmacy and type the correct one.   Has the prescription been filled recently? No  Is the patient out of the medication? No- new med  Has the patient been seen for an appointment in the last year OR does the patient have an upcoming appointment? Yes, 06/23/2024  Can we respond through MyChart? No  Agent: Please be advised that Rx refills may take up to 3 business days. We ask that you follow-up with your pharmacy.

## 2024-08-25 MED ORDER — DIAZEPAM 5 MG PO TABS: 5.0000 mg | ORAL_TABLET | Freq: Every day | ORAL | 2 refills | Status: AC

## 2024-08-25 NOTE — Addendum Note (Signed)
 Addended by: EDMAN [name] on: 08/25/2024 01:05 PM   Modules accepted: Orders

## 2024-08-25 NOTE — Telephone Encounter (Addendum)
 Called her to confirm.  Insurance no longer covers Temazepam . Will switch to Diazepam  as requested 5mg  nightly  New rx sent she can contact if any other issues filling med. Would consider Lorazepam  as other option if needed next.  Marsa Officer, DO [facility] 08/25/2024, 1:04 PM

## 2024-08-25 NOTE — Telephone Encounter (Signed)
 Pt called back to check the status of her request, she is hoping to receive this prior to the inclement weather she says.

## 2024-12-22 ENCOUNTER — Ambulatory Visit: Admitting: Family Medicine

## 2025-04-15 ENCOUNTER — Ambulatory Visit
